# Patient Record
Sex: Female | Born: 1975 | Race: Black or African American | Hispanic: No | Marital: Single | Smoking: Never smoker
Health system: Southern US, Community
[De-identification: ages and names within clinical notes are randomized; demographics above are authoritative.]

## PROBLEM LIST (undated history)

## (undated) DIAGNOSIS — Z86718 Personal history of other venous thrombosis and embolism: Secondary | ICD-10-CM

## (undated) DIAGNOSIS — R739 Hyperglycemia, unspecified: Secondary | ICD-10-CM

## (undated) DIAGNOSIS — J309 Allergic rhinitis, unspecified: Secondary | ICD-10-CM

## (undated) DIAGNOSIS — N921 Excessive and frequent menstruation with irregular cycle: Secondary | ICD-10-CM

## (undated) DIAGNOSIS — R0683 Snoring: Secondary | ICD-10-CM

## (undated) DIAGNOSIS — E119 Type 2 diabetes mellitus without complications: Secondary | ICD-10-CM

## (undated) DIAGNOSIS — G4733 Obstructive sleep apnea (adult) (pediatric): Secondary | ICD-10-CM

## (undated) DIAGNOSIS — I10 Essential (primary) hypertension: Secondary | ICD-10-CM

## (undated) DIAGNOSIS — D649 Anemia, unspecified: Secondary | ICD-10-CM

## (undated) DIAGNOSIS — D563 Thalassemia minor: Secondary | ICD-10-CM

## (undated) DIAGNOSIS — R609 Edema, unspecified: Secondary | ICD-10-CM

## (undated) DIAGNOSIS — F32A Depression, unspecified: Secondary | ICD-10-CM

## (undated) DIAGNOSIS — M1990 Unspecified osteoarthritis, unspecified site: Secondary | ICD-10-CM

## (undated) DIAGNOSIS — F329 Major depressive disorder, single episode, unspecified: Secondary | ICD-10-CM

## (undated) DIAGNOSIS — K760 Fatty (change of) liver, not elsewhere classified: Secondary | ICD-10-CM

## (undated) DIAGNOSIS — F419 Anxiety disorder, unspecified: Secondary | ICD-10-CM

## (undated) DIAGNOSIS — D573 Sickle-cell trait: Secondary | ICD-10-CM

## (undated) DIAGNOSIS — M5416 Radiculopathy, lumbar region: Secondary | ICD-10-CM

## (undated) DIAGNOSIS — G43909 Migraine, unspecified, not intractable, without status migrainosus: Secondary | ICD-10-CM

## (undated) HISTORY — DX: Anemia, unspecified: D64.9

## (undated) HISTORY — DX: Edema, unspecified: R60.9

## (undated) HISTORY — DX: Sickle-cell trait: D57.3

## (undated) HISTORY — DX: Anxiety disorder, unspecified: F41.9

## (undated) HISTORY — DX: Depression, unspecified: F32.A

## (undated) HISTORY — DX: Major depressive disorder, single episode, unspecified: F32.9

## (undated) HISTORY — DX: Excessive and frequent menstruation with irregular cycle: N92.1

## (undated) HISTORY — DX: Allergic rhinitis, unspecified: J30.9

## (undated) HISTORY — DX: Type 2 diabetes mellitus without complications: E11.9

## (undated) HISTORY — DX: Essential (primary) hypertension: I10

## (undated) HISTORY — PX: CHOLECYSTECTOMY: SHX55

## (undated) HISTORY — DX: Radiculopathy, lumbar region: M54.16

## (undated) HISTORY — DX: Hyperglycemia, unspecified: R73.9

## (undated) HISTORY — PX: TUBAL LIGATION: SHX77

## (undated) HISTORY — DX: Snoring: R06.83

---

## 2005-01-24 DIAGNOSIS — Z86718 Personal history of other venous thrombosis and embolism: Secondary | ICD-10-CM

## 2005-01-25 DIAGNOSIS — R9389 Abnormal findings on diagnostic imaging of other specified body structures: Secondary | ICD-10-CM

## 2005-03-28 ENCOUNTER — Emergency Department (HOSPITAL_COMMUNITY): Admission: EM | Admit: 2005-03-28 | Discharge: 2005-03-29 | Payer: Self-pay | Admitting: Emergency Medicine

## 2005-03-29 ENCOUNTER — Emergency Department (HOSPITAL_COMMUNITY): Admission: EM | Admit: 2005-03-29 | Discharge: 2005-03-29 | Payer: Self-pay | Admitting: Emergency Medicine

## 2005-06-01 ENCOUNTER — Emergency Department (HOSPITAL_COMMUNITY): Admission: EM | Admit: 2005-06-01 | Discharge: 2005-06-01 | Payer: Self-pay | Admitting: Emergency Medicine

## 2005-06-08 ENCOUNTER — Emergency Department (HOSPITAL_COMMUNITY): Admission: EM | Admit: 2005-06-08 | Discharge: 2005-06-08 | Payer: Self-pay | Admitting: Emergency Medicine

## 2005-06-15 ENCOUNTER — Emergency Department (HOSPITAL_COMMUNITY): Admission: EM | Admit: 2005-06-15 | Discharge: 2005-06-15 | Payer: Self-pay | Admitting: Emergency Medicine

## 2005-07-19 ENCOUNTER — Emergency Department (HOSPITAL_COMMUNITY): Admission: EM | Admit: 2005-07-19 | Discharge: 2005-07-19 | Payer: Self-pay | Admitting: Emergency Medicine

## 2005-12-12 ENCOUNTER — Inpatient Hospital Stay (HOSPITAL_COMMUNITY): Admission: EM | Admit: 2005-12-12 | Discharge: 2005-12-17 | Payer: Self-pay | Admitting: Emergency Medicine

## 2005-12-21 ENCOUNTER — Ambulatory Visit: Payer: Self-pay | Admitting: Internal Medicine

## 2005-12-21 ENCOUNTER — Encounter (INDEPENDENT_AMBULATORY_CARE_PROVIDER_SITE_OTHER): Payer: Self-pay | Admitting: Internal Medicine

## 2005-12-21 LAB — CONVERTED CEMR LAB
HCT: 34 %
MCV: 62.6 fL
Platelets: 702 10*3/uL
RBC: 5.43 M/uL

## 2005-12-28 ENCOUNTER — Encounter (INDEPENDENT_AMBULATORY_CARE_PROVIDER_SITE_OTHER): Payer: Self-pay | Admitting: Internal Medicine

## 2005-12-28 ENCOUNTER — Ambulatory Visit: Payer: Self-pay | Admitting: Internal Medicine

## 2005-12-28 DIAGNOSIS — D509 Iron deficiency anemia, unspecified: Secondary | ICD-10-CM

## 2005-12-28 LAB — CONVERTED CEMR LAB: Iron: 20 ug/dL

## 2006-01-06 ENCOUNTER — Ambulatory Visit: Payer: Self-pay | Admitting: Internal Medicine

## 2006-02-01 ENCOUNTER — Ambulatory Visit: Payer: Self-pay | Admitting: Internal Medicine

## 2006-02-23 ENCOUNTER — Ambulatory Visit: Payer: Self-pay | Admitting: Internal Medicine

## 2006-03-25 ENCOUNTER — Ambulatory Visit: Payer: Self-pay | Admitting: Internal Medicine

## 2006-04-14 ENCOUNTER — Ambulatory Visit: Payer: Self-pay | Admitting: Internal Medicine

## 2006-05-05 ENCOUNTER — Emergency Department (HOSPITAL_COMMUNITY): Admission: EM | Admit: 2006-05-05 | Discharge: 2006-05-05 | Payer: Self-pay | Admitting: Emergency Medicine

## 2006-05-27 ENCOUNTER — Ambulatory Visit: Payer: Self-pay | Admitting: Internal Medicine

## 2006-07-25 ENCOUNTER — Encounter (INDEPENDENT_AMBULATORY_CARE_PROVIDER_SITE_OTHER): Payer: Self-pay | Admitting: Internal Medicine

## 2006-07-25 ENCOUNTER — Other Ambulatory Visit: Admission: RE | Admit: 2006-07-25 | Discharge: 2006-07-25 | Payer: Self-pay | Admitting: Internal Medicine

## 2006-07-25 ENCOUNTER — Ambulatory Visit: Payer: Self-pay | Admitting: Internal Medicine

## 2006-07-25 ENCOUNTER — Encounter: Payer: Self-pay | Admitting: Internal Medicine

## 2006-07-25 DIAGNOSIS — N9089 Other specified noninflammatory disorders of vulva and perineum: Secondary | ICD-10-CM | POA: Insufficient documentation

## 2006-07-25 LAB — CONVERTED CEMR LAB: Pap Smear: NORMAL

## 2006-10-19 ENCOUNTER — Emergency Department (HOSPITAL_COMMUNITY): Admission: EM | Admit: 2006-10-19 | Discharge: 2006-10-19 | Payer: Self-pay | Admitting: Emergency Medicine

## 2006-11-25 ENCOUNTER — Encounter: Admission: RE | Admit: 2006-11-25 | Discharge: 2006-11-25 | Payer: Self-pay | Admitting: Emergency Medicine

## 2006-11-25 ENCOUNTER — Emergency Department (HOSPITAL_COMMUNITY): Admission: EM | Admit: 2006-11-25 | Discharge: 2006-11-25 | Payer: Self-pay | Admitting: Emergency Medicine

## 2006-12-16 ENCOUNTER — Ambulatory Visit: Payer: Self-pay | Admitting: Internal Medicine

## 2006-12-19 ENCOUNTER — Ambulatory Visit: Payer: Self-pay | Admitting: Internal Medicine

## 2006-12-29 ENCOUNTER — Ambulatory Visit: Payer: Self-pay | Admitting: Internal Medicine

## 2007-01-12 ENCOUNTER — Ambulatory Visit: Payer: Self-pay | Admitting: Internal Medicine

## 2007-01-17 ENCOUNTER — Ambulatory Visit: Payer: Self-pay | Admitting: Internal Medicine

## 2007-01-26 ENCOUNTER — Ambulatory Visit (HOSPITAL_COMMUNITY): Admission: RE | Admit: 2007-01-26 | Discharge: 2007-01-26 | Payer: Self-pay | Admitting: Internal Medicine

## 2007-01-26 ENCOUNTER — Encounter (INDEPENDENT_AMBULATORY_CARE_PROVIDER_SITE_OTHER): Payer: Self-pay | Admitting: *Deleted

## 2007-05-11 ENCOUNTER — Ambulatory Visit: Payer: Self-pay | Admitting: Internal Medicine

## 2007-05-30 ENCOUNTER — Ambulatory Visit (HOSPITAL_COMMUNITY): Admission: RE | Admit: 2007-05-30 | Discharge: 2007-05-30 | Payer: Self-pay | Admitting: *Deleted

## 2007-06-02 ENCOUNTER — Ambulatory Visit: Payer: Self-pay | Admitting: Internal Medicine

## 2007-06-02 DIAGNOSIS — R7309 Other abnormal glucose: Secondary | ICD-10-CM

## 2007-06-03 ENCOUNTER — Encounter (INDEPENDENT_AMBULATORY_CARE_PROVIDER_SITE_OTHER): Payer: Self-pay | Admitting: Internal Medicine

## 2007-06-03 LAB — CONVERTED CEMR LAB
CO2: 23 meq/L (ref 19–32)
Creatinine, Ser: 0.89 mg/dL (ref 0.40–1.20)
Glucose, Bld: 148 mg/dL — ABNORMAL HIGH (ref 70–99)
Sodium: 139 meq/L (ref 135–145)

## 2007-06-09 ENCOUNTER — Encounter (INDEPENDENT_AMBULATORY_CARE_PROVIDER_SITE_OTHER): Payer: Self-pay | Admitting: Internal Medicine

## 2007-06-09 DIAGNOSIS — J45909 Unspecified asthma, uncomplicated: Secondary | ICD-10-CM | POA: Insufficient documentation

## 2007-06-09 DIAGNOSIS — I10 Essential (primary) hypertension: Secondary | ICD-10-CM | POA: Insufficient documentation

## 2007-06-09 DIAGNOSIS — L509 Urticaria, unspecified: Secondary | ICD-10-CM | POA: Insufficient documentation

## 2007-06-09 DIAGNOSIS — Z8659 Personal history of other mental and behavioral disorders: Secondary | ICD-10-CM | POA: Insufficient documentation

## 2007-06-09 DIAGNOSIS — J309 Allergic rhinitis, unspecified: Secondary | ICD-10-CM | POA: Insufficient documentation

## 2007-06-20 ENCOUNTER — Ambulatory Visit: Payer: Self-pay | Admitting: Internal Medicine

## 2007-07-11 ENCOUNTER — Ambulatory Visit (HOSPITAL_BASED_OUTPATIENT_CLINIC_OR_DEPARTMENT_OTHER): Admission: RE | Admit: 2007-07-11 | Discharge: 2007-07-11 | Payer: Self-pay | Admitting: *Deleted

## 2007-07-15 ENCOUNTER — Ambulatory Visit: Payer: Self-pay | Admitting: Internal Medicine

## 2007-07-25 ENCOUNTER — Ambulatory Visit: Payer: Self-pay | Admitting: Internal Medicine

## 2007-07-25 ENCOUNTER — Encounter (INDEPENDENT_AMBULATORY_CARE_PROVIDER_SITE_OTHER): Payer: Self-pay | Admitting: *Deleted

## 2007-12-15 ENCOUNTER — Ambulatory Visit: Payer: Self-pay | Admitting: Internal Medicine

## 2007-12-18 ENCOUNTER — Ambulatory Visit: Payer: Self-pay | Admitting: Internal Medicine

## 2008-03-11 ENCOUNTER — Telehealth (INDEPENDENT_AMBULATORY_CARE_PROVIDER_SITE_OTHER): Payer: Self-pay | Admitting: Internal Medicine

## 2008-03-29 ENCOUNTER — Encounter (INDEPENDENT_AMBULATORY_CARE_PROVIDER_SITE_OTHER): Payer: Self-pay | Admitting: *Deleted

## 2008-06-27 ENCOUNTER — Encounter (INDEPENDENT_AMBULATORY_CARE_PROVIDER_SITE_OTHER): Payer: Self-pay | Admitting: *Deleted

## 2008-08-27 ENCOUNTER — Emergency Department (HOSPITAL_COMMUNITY): Admission: EM | Admit: 2008-08-27 | Discharge: 2008-08-27 | Payer: Self-pay | Admitting: Emergency Medicine

## 2008-08-30 ENCOUNTER — Encounter (INDEPENDENT_AMBULATORY_CARE_PROVIDER_SITE_OTHER): Payer: Self-pay | Admitting: *Deleted

## 2008-09-10 ENCOUNTER — Telehealth (INDEPENDENT_AMBULATORY_CARE_PROVIDER_SITE_OTHER): Payer: Self-pay | Admitting: *Deleted

## 2008-09-10 ENCOUNTER — Telehealth (INDEPENDENT_AMBULATORY_CARE_PROVIDER_SITE_OTHER): Payer: Self-pay | Admitting: Internal Medicine

## 2008-09-19 ENCOUNTER — Telehealth (INDEPENDENT_AMBULATORY_CARE_PROVIDER_SITE_OTHER): Payer: Self-pay | Admitting: Internal Medicine

## 2008-09-27 ENCOUNTER — Emergency Department (HOSPITAL_COMMUNITY): Admission: EM | Admit: 2008-09-27 | Discharge: 2008-09-27 | Payer: Self-pay | Admitting: Emergency Medicine

## 2008-10-06 ENCOUNTER — Emergency Department (HOSPITAL_COMMUNITY): Admission: EM | Admit: 2008-10-06 | Discharge: 2008-10-06 | Payer: Self-pay | Admitting: Family Medicine

## 2008-11-05 ENCOUNTER — Ambulatory Visit: Payer: Self-pay | Admitting: Internal Medicine

## 2008-11-05 DIAGNOSIS — G4733 Obstructive sleep apnea (adult) (pediatric): Secondary | ICD-10-CM

## 2008-11-05 DIAGNOSIS — R82998 Other abnormal findings in urine: Secondary | ICD-10-CM | POA: Insufficient documentation

## 2008-11-05 DIAGNOSIS — R609 Edema, unspecified: Secondary | ICD-10-CM

## 2008-11-05 LAB — CONVERTED CEMR LAB
Protein, U semiquant: NEGATIVE
Specific Gravity, Urine: 1.02
pH: 5.5

## 2008-11-06 ENCOUNTER — Encounter (INDEPENDENT_AMBULATORY_CARE_PROVIDER_SITE_OTHER): Payer: Self-pay | Admitting: Internal Medicine

## 2008-11-07 ENCOUNTER — Ambulatory Visit: Payer: Self-pay | Admitting: Internal Medicine

## 2008-11-07 LAB — CONVERTED CEMR LAB
ALT: 8 units/L (ref 0–35)
AST: 10 units/L (ref 0–37)
BUN: 15 mg/dL (ref 6–23)
CO2: 21 meq/L (ref 19–32)
Creatinine, Ser: 0.98 mg/dL (ref 0.40–1.20)
Eosinophils Relative: 1 % (ref 0–5)
HCT: 37.8 % (ref 36.0–46.0)
Hemoglobin: 11.3 g/dL — ABNORMAL LOW (ref 12.0–15.0)
Lymphocytes Relative: 27 % (ref 12–46)
MCHC: 29.9 g/dL — ABNORMAL LOW (ref 30.0–36.0)
MCV: 68.1 fL — ABNORMAL LOW (ref 78.0–100.0)
RDW: 19.7 % — ABNORMAL HIGH (ref 11.5–15.5)
Total Bilirubin: 0.3 mg/dL (ref 0.3–1.2)
WBC: 10.2 10*3/uL (ref 4.0–10.5)

## 2008-11-08 ENCOUNTER — Encounter (INDEPENDENT_AMBULATORY_CARE_PROVIDER_SITE_OTHER): Payer: Self-pay | Admitting: Internal Medicine

## 2008-11-25 ENCOUNTER — Encounter (INDEPENDENT_AMBULATORY_CARE_PROVIDER_SITE_OTHER): Payer: Self-pay | Admitting: Internal Medicine

## 2008-11-27 ENCOUNTER — Encounter (INDEPENDENT_AMBULATORY_CARE_PROVIDER_SITE_OTHER): Payer: Self-pay | Admitting: Internal Medicine

## 2008-11-29 ENCOUNTER — Encounter (INDEPENDENT_AMBULATORY_CARE_PROVIDER_SITE_OTHER): Payer: Self-pay | Admitting: *Deleted

## 2008-12-10 LAB — CONVERTED CEMR LAB: UIBC: 303 ug/dL

## 2009-01-21 ENCOUNTER — Telehealth (INDEPENDENT_AMBULATORY_CARE_PROVIDER_SITE_OTHER): Payer: Self-pay | Admitting: *Deleted

## 2009-01-24 ENCOUNTER — Encounter (INDEPENDENT_AMBULATORY_CARE_PROVIDER_SITE_OTHER): Payer: Self-pay | Admitting: *Deleted

## 2009-03-04 ENCOUNTER — Emergency Department (HOSPITAL_COMMUNITY): Admission: EM | Admit: 2009-03-04 | Discharge: 2009-03-04 | Payer: Self-pay | Admitting: Family Medicine

## 2009-03-14 ENCOUNTER — Emergency Department (HOSPITAL_COMMUNITY): Admission: EM | Admit: 2009-03-14 | Discharge: 2009-03-14 | Payer: Self-pay | Admitting: Emergency Medicine

## 2009-04-09 ENCOUNTER — Ambulatory Visit: Payer: Self-pay | Admitting: Internal Medicine

## 2009-06-04 ENCOUNTER — Emergency Department (HOSPITAL_COMMUNITY): Admission: EM | Admit: 2009-06-04 | Discharge: 2009-06-04 | Payer: Self-pay | Admitting: Family Medicine

## 2009-06-09 ENCOUNTER — Telehealth (INDEPENDENT_AMBULATORY_CARE_PROVIDER_SITE_OTHER): Payer: Self-pay | Admitting: Nurse Practitioner

## 2009-06-13 ENCOUNTER — Telehealth (INDEPENDENT_AMBULATORY_CARE_PROVIDER_SITE_OTHER): Payer: Self-pay | Admitting: Internal Medicine

## 2009-06-15 ENCOUNTER — Emergency Department (HOSPITAL_COMMUNITY): Admission: EM | Admit: 2009-06-15 | Discharge: 2009-06-15 | Payer: Self-pay | Admitting: Emergency Medicine

## 2009-06-16 ENCOUNTER — Ambulatory Visit: Payer: Self-pay | Admitting: Physician Assistant

## 2009-06-17 ENCOUNTER — Encounter (INDEPENDENT_AMBULATORY_CARE_PROVIDER_SITE_OTHER): Payer: Self-pay | Admitting: Internal Medicine

## 2009-06-19 ENCOUNTER — Encounter: Payer: Self-pay | Admitting: Physician Assistant

## 2009-06-20 ENCOUNTER — Telehealth: Payer: Self-pay | Admitting: Physician Assistant

## 2009-06-20 ENCOUNTER — Telehealth (INDEPENDENT_AMBULATORY_CARE_PROVIDER_SITE_OTHER): Payer: Self-pay | Admitting: Internal Medicine

## 2009-07-17 ENCOUNTER — Ambulatory Visit: Payer: Self-pay | Admitting: Internal Medicine

## 2009-07-17 DIAGNOSIS — IMO0002 Reserved for concepts with insufficient information to code with codable children: Secondary | ICD-10-CM

## 2009-07-17 DIAGNOSIS — F410 Panic disorder [episodic paroxysmal anxiety] without agoraphobia: Secondary | ICD-10-CM

## 2009-07-18 ENCOUNTER — Encounter (INDEPENDENT_AMBULATORY_CARE_PROVIDER_SITE_OTHER): Payer: Self-pay | Admitting: Internal Medicine

## 2009-07-21 ENCOUNTER — Encounter (INDEPENDENT_AMBULATORY_CARE_PROVIDER_SITE_OTHER): Payer: Self-pay | Admitting: Internal Medicine

## 2009-07-29 ENCOUNTER — Telehealth (INDEPENDENT_AMBULATORY_CARE_PROVIDER_SITE_OTHER): Payer: Self-pay | Admitting: Internal Medicine

## 2009-08-07 ENCOUNTER — Telehealth (INDEPENDENT_AMBULATORY_CARE_PROVIDER_SITE_OTHER): Payer: Self-pay | Admitting: Internal Medicine

## 2009-08-08 ENCOUNTER — Encounter (INDEPENDENT_AMBULATORY_CARE_PROVIDER_SITE_OTHER): Payer: Self-pay | Admitting: Internal Medicine

## 2009-08-14 ENCOUNTER — Ambulatory Visit: Payer: Self-pay | Admitting: Internal Medicine

## 2009-08-22 ENCOUNTER — Telehealth (INDEPENDENT_AMBULATORY_CARE_PROVIDER_SITE_OTHER): Payer: Self-pay | Admitting: Internal Medicine

## 2009-08-29 ENCOUNTER — Encounter (INDEPENDENT_AMBULATORY_CARE_PROVIDER_SITE_OTHER): Payer: Self-pay | Admitting: Internal Medicine

## 2009-09-01 ENCOUNTER — Encounter (INDEPENDENT_AMBULATORY_CARE_PROVIDER_SITE_OTHER): Payer: Self-pay | Admitting: Internal Medicine

## 2009-09-24 ENCOUNTER — Encounter (INDEPENDENT_AMBULATORY_CARE_PROVIDER_SITE_OTHER): Payer: Self-pay | Admitting: Internal Medicine

## 2009-10-16 ENCOUNTER — Telehealth (INDEPENDENT_AMBULATORY_CARE_PROVIDER_SITE_OTHER): Payer: Self-pay | Admitting: Internal Medicine

## 2009-11-20 ENCOUNTER — Telehealth (INDEPENDENT_AMBULATORY_CARE_PROVIDER_SITE_OTHER): Payer: Self-pay | Admitting: Internal Medicine

## 2009-12-09 ENCOUNTER — Ambulatory Visit: Payer: Self-pay | Admitting: Internal Medicine

## 2009-12-09 DIAGNOSIS — M67919 Unspecified disorder of synovium and tendon, unspecified shoulder: Secondary | ICD-10-CM | POA: Insufficient documentation

## 2009-12-09 DIAGNOSIS — F329 Major depressive disorder, single episode, unspecified: Secondary | ICD-10-CM

## 2009-12-09 DIAGNOSIS — M719 Bursopathy, unspecified: Secondary | ICD-10-CM

## 2009-12-09 DIAGNOSIS — N920 Excessive and frequent menstruation with regular cycle: Secondary | ICD-10-CM

## 2009-12-17 LAB — CONVERTED CEMR LAB
Basophils Absolute: 0 10*3/uL (ref 0.0–0.1)
Basophils Relative: 0 % (ref 0–1)
Eosinophils Relative: 3 % (ref 0–5)
Hemoglobin: 9.2 g/dL — ABNORMAL LOW (ref 12.0–15.0)
Lymphs Abs: 1.8 10*3/uL (ref 0.7–4.0)
Monocytes Absolute: 0.5 10*3/uL (ref 0.1–1.0)
Monocytes Relative: 5 % (ref 3–12)
Platelets: 661 10*3/uL — ABNORMAL HIGH (ref 150–400)
RBC: 5.02 M/uL (ref 3.87–5.11)
RDW: 19 % — ABNORMAL HIGH (ref 11.5–15.5)
WBC: 9 10*3/uL (ref 4.0–10.5)

## 2009-12-26 ENCOUNTER — Telehealth (INDEPENDENT_AMBULATORY_CARE_PROVIDER_SITE_OTHER): Payer: Self-pay | Admitting: Internal Medicine

## 2009-12-29 ENCOUNTER — Telehealth (INDEPENDENT_AMBULATORY_CARE_PROVIDER_SITE_OTHER): Payer: Self-pay | Admitting: Internal Medicine

## 2010-01-02 ENCOUNTER — Encounter (INDEPENDENT_AMBULATORY_CARE_PROVIDER_SITE_OTHER): Payer: Self-pay | Admitting: Internal Medicine

## 2010-01-23 ENCOUNTER — Telehealth (INDEPENDENT_AMBULATORY_CARE_PROVIDER_SITE_OTHER): Payer: Self-pay | Admitting: Internal Medicine

## 2010-02-26 ENCOUNTER — Telehealth (INDEPENDENT_AMBULATORY_CARE_PROVIDER_SITE_OTHER): Payer: Self-pay | Admitting: Internal Medicine

## 2010-03-10 ENCOUNTER — Ambulatory Visit: Payer: Self-pay | Admitting: Internal Medicine

## 2010-03-10 ENCOUNTER — Telehealth (INDEPENDENT_AMBULATORY_CARE_PROVIDER_SITE_OTHER): Payer: Self-pay | Admitting: Internal Medicine

## 2010-03-10 DIAGNOSIS — R002 Palpitations: Secondary | ICD-10-CM

## 2010-03-16 ENCOUNTER — Telehealth (INDEPENDENT_AMBULATORY_CARE_PROVIDER_SITE_OTHER): Payer: Self-pay | Admitting: Internal Medicine

## 2010-04-01 ENCOUNTER — Telehealth (INDEPENDENT_AMBULATORY_CARE_PROVIDER_SITE_OTHER): Payer: Self-pay | Admitting: Internal Medicine

## 2010-04-01 ENCOUNTER — Encounter (INDEPENDENT_AMBULATORY_CARE_PROVIDER_SITE_OTHER): Payer: Self-pay | Admitting: Internal Medicine

## 2010-04-20 ENCOUNTER — Telehealth (INDEPENDENT_AMBULATORY_CARE_PROVIDER_SITE_OTHER): Payer: Self-pay | Admitting: Internal Medicine

## 2010-07-24 ENCOUNTER — Telehealth (INDEPENDENT_AMBULATORY_CARE_PROVIDER_SITE_OTHER): Payer: Self-pay | Admitting: Internal Medicine

## 2010-07-29 ENCOUNTER — Encounter (INDEPENDENT_AMBULATORY_CARE_PROVIDER_SITE_OTHER): Payer: Self-pay | Admitting: Internal Medicine

## 2010-08-18 ENCOUNTER — Telehealth (INDEPENDENT_AMBULATORY_CARE_PROVIDER_SITE_OTHER): Payer: Self-pay | Admitting: Internal Medicine

## 2010-08-19 ENCOUNTER — Telehealth (INDEPENDENT_AMBULATORY_CARE_PROVIDER_SITE_OTHER): Payer: Self-pay | Admitting: Internal Medicine

## 2010-09-16 ENCOUNTER — Telehealth (INDEPENDENT_AMBULATORY_CARE_PROVIDER_SITE_OTHER): Payer: Self-pay | Admitting: Internal Medicine

## 2010-09-27 ENCOUNTER — Emergency Department (HOSPITAL_COMMUNITY): Admission: EM | Admit: 2010-09-27 | Discharge: 2010-09-27 | Payer: Self-pay | Admitting: Family Medicine

## 2010-09-28 ENCOUNTER — Telehealth (INDEPENDENT_AMBULATORY_CARE_PROVIDER_SITE_OTHER): Payer: Self-pay | Admitting: Internal Medicine

## 2010-09-30 ENCOUNTER — Telehealth (INDEPENDENT_AMBULATORY_CARE_PROVIDER_SITE_OTHER): Payer: Self-pay | Admitting: Internal Medicine

## 2010-10-15 ENCOUNTER — Telehealth (INDEPENDENT_AMBULATORY_CARE_PROVIDER_SITE_OTHER): Payer: Self-pay | Admitting: Internal Medicine

## 2010-11-09 ENCOUNTER — Telehealth (INDEPENDENT_AMBULATORY_CARE_PROVIDER_SITE_OTHER): Payer: Self-pay | Admitting: Internal Medicine

## 2010-11-12 ENCOUNTER — Ambulatory Visit: Admit: 2010-11-12 | Payer: Self-pay | Admitting: Internal Medicine

## 2010-11-23 ENCOUNTER — Encounter: Payer: Self-pay | Admitting: Internal Medicine

## 2010-12-01 NOTE — Progress Notes (Signed)
Summary: med refills  Phone Note Call from Patient Call back at Home Phone [phone]   Summary of Call: Ms. [name] needs more refills from tramadol, arthrotec and hydrocodone medications.  [facility] Initial call taken by: Manon Hilding,  December 26, 2009 11:00 AM  Follow-up for Phone Call        Pt last got a months worth of ultram and vicodin on 11-24-09 and Arthrotec was last given 09-19-09 #60/2 Follow-up by: Vesta Mixer CMA,  December 26, 2009 12:35 PM    Prescriptions: ULTRAM 50 MG TABS (TRAMADOL HCL) 2  tabs by mouth three times a day  #180 x 0   Entered and Authorized by:   Julieanne Manson MD   Signed by:   Julieanne Manson MD on 12/26/2009   Method used:   Printed then faxed to ...       [facility]  [address] [phone]* (retail)       [address]       [place], [place]  [address]       Ph: [phone]       Fax: [phone]   RxID:   [id number] VICODIN 5-500 MG TABS (HYDROCODONE-ACETAMINOPHEN) 1 tab by mouth every 4 hours as needed severe pain  #30 x 0   Entered and Authorized by:   Julieanne Manson MD   Signed by:   Julieanne Manson MD on 12/26/2009   Method used:   Printed then faxed to ...       [facility]  [address] [phone]* (retail)       [address]       [place], [place]  [address]       Ph: [phone]       Fax: [phone]   RxID:   [id number] ARTHROTEC 75 75-200 MG-MCG TAB (DICLOFENAC-MISOPROSTOL) Take one (1) tablet by mouth twice a day (with food).  #60 x 2   Entered and Authorized by:   Julieanne Manson MD   Signed by:   Julieanne Manson MD on 12/26/2009   Method used:   Printed then faxed to ...       [facility]  [address] [phone]* (retail)       [address]       [place], [place]  [address]       Ph: [phone]       Fax: [phone]   RxID:   [id number]

## 2010-12-01 NOTE — Progress Notes (Signed)
Summary: Neurosurgery referral  Phone Note Outgoing Call   Summary of Call: Debra:  see Neurosurgery referral and letter back in 07/17/09 or thereabouts--need to rerefer--pt. now has money to go.  She is also able to start PT as well for this if that info is requested by NS. Initial call taken by: Julieanne Manson MD,  Mar 10, 2010 12:53 PM

## 2010-12-01 NOTE — Progress Notes (Signed)
Summary: Refills Request  Phone Note Call from Patient Call back at Home Phone [phone]   Summary of Call: Pt needs more refills from tramadol, vicodin and flexeril medications.  The medicaid stopped paying for cozaar and pt is wondered which medication she can start taking to replace cozaar.  ([facility] [address]) Please call her back.  Aniyah Nobis MD Initial call taken by: Manon Hilding,  January 23, 2010 11:43 AM  Follow-up for Phone Call        Ultram and vicodin last filled 12/26/09.  Flexeril 06/11/09.  Cozaar should be appoved now (was done 01/02/10) Follow-up by: Vesta Mixer CMA,  January 23, 2010 1:34 PM  Additional Follow-up for Phone Call Additional follow up Details #1::        Please call pharmacy and see what the deal is with the Cozaar--I think we've been through this at least 3 times regarding her Cozaar. Flexeril has not been filled for some time--will fill others. Additional Follow-up by: Julieanne Manson MD,  January 26, 2010 3:28 PM    Additional Follow-up for Phone Call Additional follow up Details #2::    Pt states she had to travel home to check on her dad and was in the car for several hours which made her back real stiff and sore and that is why she is requesting the flexeril this time.  No problem with the Cozaar, just that her one year auth from last year had run out and they needed a new one, which we have obtained. Follow-up by: Vesta Mixer CMA,  January 26, 2010 4:48 PM  Prescriptions: FLEXERIL 10 MG TAB (CYCLOBENZAPRINE HCL) One tablet by mouth daily as needed for muscles  #30 x 0   Entered and Authorized by:   Julieanne Manson MD   Signed by:   Julieanne Manson MD on 02/02/2010   Method used:   Printed then faxed to ...       [facility]  [address] [phone]* (retail)       [address]       [place], [place]  [address]       Ph: [phone]       Fax: [phone]   RxID:   [id number] VICODIN 5-500 MG TABS (HYDROCODONE-ACETAMINOPHEN) 1 tab by  mouth every 4 hours as needed severe pain  #30 x 0   Entered and Authorized by:   Julieanne Manson MD   Signed by:   Julieanne Manson MD on 01/26/2010   Method used:   Printed then faxed to ...       [facility]  [address] [phone]* (retail)       [address]       [place], [place]  [address]       Ph: [phone]       Fax: [phone]   RxID:   [id number] ULTRAM 50 MG TABS (TRAMADOL HCL) 2  tabs by mouth three times a day  #180 x 0   Entered and Authorized by:   Julieanne Manson MD   Signed by:   Julieanne Manson MD on 01/26/2010   Method used:   Printed then faxed to ...       [facility]  [address] [phone]* (retail)       [address]       [place], [place]  [address]       Ph: [phone]       Fax: [phone]   RxID:   [id number] COZAAR 50 MG  TABS (  LOSARTAN POTASSIUM) 1 tab by mouth daily  #30 x 11   Entered and Authorized by:   Julieanne Manson MD   Signed by:   Julieanne Manson MD on 01/26/2010   Method used:   Electronically to        [facility] [phone]* (retail)       [address]       [place], [place]  [address]       Ph: [phone]       Fax: [phone]   RxID:   [id number]

## 2010-12-01 NOTE — Letter (Signed)
Summary: ORDER FOR NUTRITION & DIABETES  ORDER FOR NUTRITION & DIABETES   Imported By: Arta Bruce 12/09/2009 12:38:35  _____________________________________________________________________  External Attachment:    Type:   Image     Comment:   External Document

## 2010-12-01 NOTE — Letter (Signed)
Summary: [facility] NEUROSURGERY/CONSULTATION REQUEST  [facility]UROS[facility]/CONSULTATION REQUEST   Imported By: Arta Bruce 12/17/2009 14:30:07  _____________________________________________________________________  External Attachment:    Type:   Image     Comment:   External Document

## 2010-12-01 NOTE — Letter (Signed)
Summary: Generic Letter  [facility]  [address] [place], [place] [address]   Phone: [phone]  Fax: [phone]    07/29/2010  [facility] [address] [place], [place]  [address]  Dear Ms. [name],  We have been unable to contact you by telephone regarding your recent call to [place].  Please call our office, at your earliest convenience, so that we may speak with you.  Sincerely,   Dutch Quint RN

## 2010-12-01 NOTE — Progress Notes (Signed)
Summary: DO WE HAVE ANY ADVAIS AND VENTOLIN SAMPLES  Phone Note Call from Patient Call back at Home Phone [phone]   Summary of Call: [name] [name] CALLING TO SEE IF WE HAVE SAMPLES OF THE ADVAIR DISC AND VENTOLIN INHALER, [name] IS STARTING TO BOTHER HER AND SHE IS HAVING SOME WHEEZING.  MS Pau SAYS THAT IF YOU CALL HER BACK, SHE'S IN SCHOOL NOW, BUT IF YOU CAN LEAVE HER A DETAIL MESSAGE ON HER CELL PHONE. Initial call taken by: Leodis Rains,  August 18, 2010 11:58 AM  Follow-up for Phone Call        Left message on answering machine for pt. to return call.  Dutch Quint RN  August 19, 2010 11:03 AM  Lost all Medicaid benefits -- advised to try health dept for assistance with her meds and to make appt. with eligibility for orange card. Follow-up by: Dutch Quint RN,  August 19, 2010 11:59 AM

## 2010-12-01 NOTE — Letter (Signed)
Summary: REFERRAL/PERSONAL CARE SERVICES  REFERRAL/PERSONAL CARE SERVICES   Imported By: Arta Bruce 11/19/2009 11:39:01  _____________________________________________________________________  External Attachment:    Type:   Image     Comment:   External Document

## 2010-12-01 NOTE — Progress Notes (Signed)
Summary: Refills request  Phone Note Call from Patient Call back at [facility] Phone [phone]   Summary of Call: The pt needs more refills from tramadol and vicodin medications. ([facility] [address])  Pt states that her shoulder is hurting her very bad. [facility] Initial call taken by: Manon Hilding,  February 26, 2010 3:16 PM  Follow-up for Phone Call        Both last filled on 01/26/10. Follow-up by: Vesta Mixer CMA,  February 26, 2010 4:08 PM    Prescriptions: VICODIN 5-500 MG TABS (HYDROCODONE-ACETAMINOPHEN) 1 tab by mouth every 4 hours as needed severe pain  #30 x 0   Entered and Authorized by:   Julieanne Manson MD   Signed by:   Julieanne Manson MD on 03/01/2010   Method used:   Printed then faxed to ...       [facility]  [address] [phone]* (retail)       [address]       [place], [place]  [address]       Ph: [phone]       Fax: [phone]   RxID:   [id number] ULTRAM 50 MG TABS (TRAMADOL HCL) 2  tabs by mouth three times a day  #180 x 0   Entered and Authorized by:   Julieanne Manson MD   Signed by:   Julieanne Manson MD on 03/01/2010   Method used:   Printed then faxed to ...       [facility]  [address] [phone]* (retail)       [address]       [place], [place]  [address]       Ph: [phone]       Fax: [phone]   RxID:   [id number]

## 2010-12-01 NOTE — Progress Notes (Signed)
Summary: Hydrocodone and Tramadol  Phone Note Refill Request   Refills Requested: Medication #1:  ULTRAM 50 MG TABS 2  tabs by mouth three times a day  Medication #2:  VICODIN 5-500 MG TABS 1 tab by mouth every 4 hours as needed severe pain Initial call taken by: Dutch Quint RN,  September 30, 2010 4:38 PM  Follow-up for Phone Call        Rx printed - unsure of pt's pharmacy  verify pt with and fax rx to the pharmacy Follow-up by: Lehman Prom FNP,  September 30, 2010 5:35 PM  Additional Follow-up for Phone Call Additional follow up Details #1::        Left message on answering machine for pt. to return call.  Dutch Quint RN  October 01, 2010 10:18 AM  Rxs faxed to [facility] [address] per pt. request.  Dutch Quint RN  October 02, 2010 9:36 AM     Prescriptions: VICODIN 5-500 MG TABS (HYDROCODONE-ACETAMINOPHEN) 1 tab by mouth every 4 hours as needed severe pain  #30 x 0   Entered and Authorized by:   Lehman Prom FNP   Signed by:   Lehman Prom FNP on 09/30/2010   Method used:   Printed then faxed to ...       [facility]  [address] [phone]* (retail)       [address]       [place], [place]  [address]       Ph: [phone]       Fax: [phone]   RxID:   [id number] ULTRAM 50 MG TABS (TRAMADOL HCL) 2  tabs by mouth three times a day  #180 x 0   Entered and Authorized by:   Lehman Prom FNP   Signed by:   Lehman Prom FNP on 09/30/2010   Method used:   Printed then faxed to ...       [facility]  [address] [phone]* (retail)       [address]       [place], [place]  [address]       Ph: [phone]       Fax: [phone]   RxID:   [id number]

## 2010-12-01 NOTE — Progress Notes (Signed)
Summary: Neurosurgery or Pain mangemt  Phone Note From Other Clinic   Caller: Referral Coordinator Summary of Call: Pt was referred to [facility] Neurosurgery.The office informed me that they do not do injections, they only perform surgeries.Office also states that Pt had appt with their pain management provider and cancellled.So where would you like to go with this referral.Pain mangemt or surgery?.....P.S Is there any documentation to support surgery? Initial call taken by: Candi Leash,  Mar 16, 2010 9:59 AM  Follow-up for Phone Call        PLease see my original referral letter dated 07/17/09.  The main reason I would like her to see Neurosurgery is secondary to inability to fully evaluate her spine as she is too heavy for all MR[place] in state.  I do not know if she is a surgical candidate or not.  She did not go to previous planned appt. as she could not afford, but now brother is helping her out.  We are looking to see if something more can be done rather than just pain management.  I was hoping NS would consider evaluating with a myelogram if they feel her signs and symptoms warrant--again in previous referral note.  This problem has significantly altered her life to point she cannot work and therefore has had problems getting money to cover.  If they won't accept this, let me know. Follow-up by: Julieanne Manson MD,  Mar 18, 2010 8:06 AM  Additional Follow-up for Phone Call Additional follow up Details #1::        [facility] said that there is no established study supporting an appt,so until there is they will not make an appt.How would like to move forward with this? Additional Follow-up by: Candi Leash,  Mar 25, 2010 8:51 AM    Additional Follow-up for Phone Call Additional follow up Details #2::    Spoke with Candi Leash while away from EMR earlier in week and explained the situation.  She was too see if could get in with [facility] Debra--just sending you back as  a reminder.  Thanks for your time with this Follow-up by: Julieanne Manson MD,  Mar 28, 2010 5:32 PM  Additional Follow-up for Phone Call Additional follow up Details #3:: Details for Additional Follow-up Action Taken: Christena Flake is reviewing notes.As soon as I find out I will send you an update Susie called from [facility] to say that Ms.Willow is not an candidate for surgery.Maybe she could go to [facility] for injections.What do you think?    We can try that--referral to interventional radiology.  Thanks for the idea. Order written.  Julieanne Manson MD  April 20, 2010 8:48 AM   Additional Follow-up by: Candi Leash,  April 15, 2010 2:32 PM

## 2010-12-01 NOTE — Progress Notes (Signed)
Summary: Query Advair  Phone Note From Pharmacy   Caller: [facility]  [address] [phone]* Summary of Call: Request refill Advair Last seen 03/10/10, Last filled 09/2009 for 11 refills. Do you want to fill? Initial call taken by: Gaylyn Cheers RN,  September 28, 2010 3:54 PM  Follow-up for Phone Call        Yes--for 6 months--needs a follow up for more Follow-up by: Julieanne Manson MD,  September 29, 2010 3:48 PM  Additional Follow-up for Phone Call Additional follow up Details #1::        Refill sent. Gaylyn Cheers RN  September 29, 2010 5:30 PM  Pt. aware of refills -- F/U appt. 11/12/10.  Dutch Quint RN  September 29, 2010 5:34 PM

## 2010-12-01 NOTE — Progress Notes (Signed)
Summary: Neuro update  Phone Note From Other Clinic   Caller: Referral Coordinator Summary of Call: Pt exceeds wt limit for [facility].Judi from [employer] [facility].So I'll try them. Initial call taken by: Candi Leash,  April 20, 2010 4:28 PM  Follow-up for Phone Call        Thanks Follow-up by: Julieanne Manson MD,  April 20, 2010 5:56 PM  Additional Follow-up for Phone Call Additional follow up Details #1::        [facility]  said that they don't do injections but maybe able to perform an MRI if she is less than 17 inches height from laying flat on back to top of belly Would you happen to know if this is th case with her? Additional Follow-up by: Candi Leash,  April 21, 2010 2:45 PM    Additional Follow-up for Phone Call Additional follow up Details #2::    I do not know--they told us previously that they would not do an MRI as she weighed too much.  I can have Tiffany bring her in and measure her lying down.  Very doubtful that she will meet this criteria.  Thanks Stanton Kidney.    Tiffany--could you have Hser come in and have her lie down on a bed and measure her for above?  Julieanne Manson MD  April 22, 2010 2:17 PM   Additional Follow-up for Phone Call Additional follow up Details #3:: Details for Additional Follow-up Action Taken: Pt aware and will come in tomorrow to be measured........... Vesta Mixer CMA  April 29, 2010 2:34 PM

## 2010-12-01 NOTE — Assessment & Plan Note (Signed)
Summary: 2 month f/u /tmm   Vital Signs:  Patient profile:   35 year old female Menstrual status:  regular Weight:      476 pounds Temp:     97.8 degrees F Pulse rhythm:   regular Resp:     18 per minute BP sitting:   126 / 83  (left arm) Cuff size:   large  Vitals Entered By: Vesta Mixer CMA (December 09, 2009 10:38 AM) CC: 2 month f/u, back is still hurting out of bp meds x 1 weeks due to no money to get. Is Patient Diabetic? No Pain Assessment Patient in pain? yes     Location: back Intensity: 6  Does patient need assistance? Ambulation Normal   CC:  2 month f/u and back is still hurting out of bp meds x 1 weeks due to no money to get.Marland Kitchen  History of Present Illness: 1.  Social issues:  pt. not able to afford to pay copay for visits to specialists--can only afford $3 copay once per month--has to alternate what med she gets.  Did not go to Dr. Murray Hodgkins for low back and radicular pain.  Is getting help with Medicaid Home Aide.  Helping clean the home.  Is going to a counselor, Milagros Evener  [phone]--has appt. with her today.  Does get food stamps.  Working on depression and weight.  Does not feel Zoloft is working.  Took for total of 2 months--stopped in December or November.  Appetite has decreased since starting counseling 1 month ago.  Would be willing to try a higher dose of Zoloft.  Father of 3 children disappeared in 2004--has not had any contact.  Pt. feels he developed mental illness.   His parents are quite chronically ill and unable to help out.    2.  Radicular low back pain:  Still has referral info--brother may be able to help her out.  Has been unable to pay the $3 copay to see Dr. Penelope Galas as above.  Currently using Tramadol, Vicodin, Neurontin.  Have been unable to get an MRI secondary to pt's weight--pt. working on weight loss so can get this done.  3.  Left arm/shoulder pain for about 3-4 months:  if abducts or maybe flex, hurts to put clothes on.  If holding  something and raises her shoulder, the pain becomes so intense that she is unable to hold onto the object.  No history of injury.  Does use that side to pull herself up since the problem with her back.    4.  Abnormal periods:  Heavy, mildly prolonged periods past 2 months.  This month seems more like usual.  Lot of clotting and having to use many more feminine products.  Lot more cramping.  Feeling light headed and tired.  Not clear when her last pap was done--was lost to follow up for some time.  Allergies (verified): 1)  Amoxicillin  Physical Exam  General:  Morbdily obese, NAD Msk:  Left shoulder and arm:  Unable to flex or abduct above 90 degrees secondary to pain.  Tender over AC, CC and subdeltoid bursa area.  Pain and difficulty resisting downward force on internally rotated, abducted outstretched arm on left compared to right. NT over c spinous processes and paraspinous musculature.  NT over clavicle and scapula   Informed consent obtained for shoulder injection: Left subdeltoid area cleaned in sterile fashion.  1% lidocaine injected subcutaneously and to bursa area.  40 mg of Depomedrol and 1 cc  of 1% lidocaine injected to bursa area.  Pt. tolerated well without complication.  Bandaid applied. Neurologic:  Good grip bilaterally.  Motor strength normal and equal in bilateral upper extremities.   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 Discussed that ultimately, this would be a good idea, but needs to get relief of pain so she can exercise--encouraged her to get $3 to go to Dr. Murray Hodgkins.  She will speak with her brother. Her updated medication list for this problem includes:    Arthrotec 75 75-200 Mg-mcg Tab (Diclofenac-misoprostol) .Marland Kitchen... Take one (1) tablet by  mouth twice a day (with food).    Flexeril 10 Mg Tab (Cyclobenzaprine hcl) ..... One tablet by mouth daily as needed for muscles    Ultram 50 Mg Tabs (Tramadol hcl) .Marland Kitchen... 2  tabs by mouth three times a day    Vicodin 5-500 Mg Tabs (Hydrocodone-acetaminophen) .Marland Kitchen... 1 tab by mouth every 4 hours as needed severe pain  Problem # 3:  MORBID OBESITY (ICD-278.01) Encouraged her lifestyle changes and applauded weight loss. Pt. okay with me speaking to her counselor.  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 1/2 tab by mouth for 7 days, then 1 tab by mouth daily for 7 days, then 2 tabs by mouth daily  Complete Medication List: 1)  Singulair 10 Mg Tabs (Montelukast sodium) .Marland Kitchen.. 1 tab by mouth daily 2)  Cozaar 50 Mg Tabs (Losartan potassium) .Marland Kitchen.. 1 tab by mouth daily 3)  Proventil Hfa 108 (90 Base) Mcg/act Aers (Albuterol sulfate) .... 2 puffs every 4 hours as needed for wheezing 4)  Cetirizine Hcl 10 Mg Tabs (Cetirizine hcl) .Marland Kitchen.. 1 tab by mouth daily as needed allergies 5)  Graduated Compression Stockings--knee High. Moderate Compre  .... Put first thing in morning and remove at bedtime. 6)  Ferrous Sulfate 325 (65 Fe) Mg Tabs (Ferrous sulfate) .Marland Kitchen.. 1 tab by mouth two times a day 7)  Fluticasone Propionate 50 Mcg/act Susp (Fluticasone propionate) .... 2 sprays each nostril daily 8)  Arthrotec 75 75-200 Mg-mcg Tab (Diclofenac-misoprostol) .... Take one (1) tablet by mouth twice a day (with food). 9)  Flexeril 10 Mg Tab (Cyclobenzaprine hcl) .... One tablet by mouth daily as needed for muscles 10)  Ultram 50 Mg Tabs (Tramadol hcl) .... 2  tabs by mouth three times a day 11)  Neurontin 300 Mg Caps (Gabapentin) .... One cap by mouth three times a day 12)  Zoloft 50 Mg Tabs  (Sertraline hcl) .... 1/2 tab by mouth for 7 days, then 1 tab by mouth daily for 7 days, then 2 tabs by mouth daily 13)  Round Shower Stool Misc (Misc. devices) .... 724.4 14)  Advair Diskus 250-50 Mcg/dose Aepb (Fluticasone-salmeterol) .Marland Kitchen.. 1 inhalation two times a day 15)  Vicodin 5-500 Mg Tabs (Hydrocodone-acetaminophen) .Marland Kitchen.. 1 tab by mouth every 4 hours as needed severe pain  Patient Instructions: 1)  Release of info for Pat  [facility]--to be able to discuss generally how you are doing. 2)  Follow up with Dr. Delrae Alfred in 3 months --back and shoulder pain  Prescriptions: ZOLOFT 50 MG TABS (SERTRALINE HCL) 1/2 tab by mouth for 7 days, then 1 tab by mouth daily for 7 days, then 2 tabs by mouth daily  #60 x 2   Entered and Authorized by:   Julieanne Manson MD   Signed by:   Julieanne Manson MD on 12/09/2009   Method used:   Electronically to        [facility] [phone]* (retail)       [address]       [place], [place]  [address]       Ph: [phone]       Fax: [phone]   RxID:   [id number]

## 2010-12-01 NOTE — Progress Notes (Signed)
°  Phone Note Call from Patient Call back at Home Phone [phone]   Reason for Call: Refill Medication Summary of Call: Pt called in again because neither of three medications are helping her with her lower back and leg pain and she cannot walk to go to the bathroom or any other way around her house.  Pt is wondering can call in a stronger pain medication.  ([facility] [address]) Delrae Alfred MD  Initial call taken by: Manon Hilding,  June 13, 2009 8:50 AM  Follow-up for Phone Call        spoke with pt and she says her is not helping her back and she is unable to move.... she said she recieved the med on Aug. 11.... pt says she is in the same pain as before... she says she is unable to sleep because of the pain.... pt has appt on Monday....................Marland KitchenArmenia Shannon  June 13, 2009 4:56 PM   Additional Follow-up for Phone Call Additional follow up Details #1::        Needs to be seen first. Additional Follow-up by: Julieanne Manson MD,  June 15, 2009 4:48 PM    Additional Follow-up for Phone Call Additional follow up Details #2::    pt was here today.............Marland KitchenArmenia Shannon  June 16, 2009 5:46 PM

## 2010-12-01 NOTE — Progress Notes (Signed)
Summary: Query:  Refill Ventolin?  Phone Note Outgoing Call   Summary of Call: Do you want to refill Ventolin?  Last seen 03/2010. Initial call taken by: Dutch Quint RN,  August 19, 2010 12:44 PM  Follow-up for Phone Call        Fine to fill.  1 + 1 refill.  Does she have Medicaid again?  If not, see if we have samples she can pick up.  Advair as well.  .   Follow-up by: Julieanne Manson MD,  August 20, 2010 11:27 PM  Additional Follow-up for Phone Call Additional follow up Details #1::        Refill completed -- pt. notified, states that she has Medicaid again Additional Follow-up by: Dutch Quint RN,  August 21, 2010 4:31 PM

## 2010-12-01 NOTE — Progress Notes (Signed)
Summary: refills  Phone Note Call from Patient Call back at [facility] Phone [phone]   Summary of Call: The pt re-scheduled the appointment but she is wondering if the provider can refills her vicodin and tramadol medication ([facility] [address]) Delrae Alfred MD Initial call taken by: Manon Hilding,  November 20, 2009 10:10 AM  Follow-up for Phone Call        Last got #30 Vicodin on 10/21/09 and #180 on the same date. Follow-up by: Vesta Mixer CMA,  November 21, 2009 3:38 PM    Prescriptions: VICODIN 5-500 MG TABS (HYDROCODONE-ACETAMINOPHEN) 1 tab by mouth every 4 hours as needed severe pain  #30 x 0   Entered and Authorized by:   Lehman Prom FNP   Signed by:   Lehman Prom FNP on 11/24/2009   Method used:   Printed then faxed to ...       [facility]  [address] [phone]* (retail)       [address]       [place], [place]  [address]       Ph: [phone]       Fax: [phone]   RxID:   [id number] ULTRAM 50 MG TABS (TRAMADOL HCL) 2  tabs by mouth three times a day  #180 x 0   Entered and Authorized by:   Lehman Prom FNP   Signed by:   Lehman Prom FNP on 11/24/2009   Method used:   Printed then faxed to ...       [facility]  [address] [phone]* (retail)       [address]       [place], [place]  [address]       Ph: [phone]       Fax: [phone]   RxID:   [id number]

## 2010-12-01 NOTE — Letter (Signed)
Summary: REFERRAL//PHYSICAL THERAPY  REFERRAL//PHYSICAL THERAPY   Imported By: Arta Bruce 04/01/2010 09:52:10  _____________________________________________________________________  External Attachment:    Type:   Image     Comment:   External Document

## 2010-12-01 NOTE — Progress Notes (Signed)
Summary: REQUESTING REFILL ON PAIN MEDS  Phone Note Call from Patient Call back at Home Phone [phone]   Reason for Call: Refill Medication Summary of Call: [name] PT. MS Mathew IS CALLING IN FOR FOR HER TRAMADOL AND VICODEN Initial call taken by: Leodis Rains,  April 01, 2010 9:38 AM  Follow-up for Phone Call        Pain meds were both filled on 03/01/10. Follow-up by: Vesta Mixer CMA,  April 02, 2010 9:49 AM  Additional Follow-up for Phone Call Additional follow up Details #1::        pt is aware Additional Follow-up by: Armenia Shannon,  April 02, 2010 2:55 PM    Prescriptions: VICODIN 5-500 MG TABS (HYDROCODONE-ACETAMINOPHEN) 1 tab by mouth every 4 hours as needed severe pain  #30 x 0   Entered and Authorized by:   Julieanne Manson MD   Signed by:   Julieanne Manson MD on 04/02/2010   Method used:   Telephoned to ...       [facility]  [address] [phone]* (retail)       [address]       [place], [place]  [address]       Ph: [phone]       Fax: [phone]   RxID:   [id number] ULTRAM 50 MG TABS (TRAMADOL HCL) 2  tabs by mouth three times a day  #180 x 0   Entered and Authorized by:   Julieanne Manson MD   Signed by:   Julieanne Manson MD on 04/02/2010   Method used:   Telephoned to ...       [facility]  [address] [phone]* (retail)       [address]       [place], [place]  [address]       Ph: [phone]       Fax: [phone]   RxID:   [id number]  Please call these into the pharmacy --already documented.

## 2010-12-01 NOTE — Progress Notes (Signed)
  Phone Note From Pharmacy   Caller: [facility]  [address] [phone]* Summary of Call: Calling for preauthorization of Cozaar--I thought we had her preauthorizations already taken care of--please call Medicaid and see if this is needed or if Cozaar already good for the year.  If she does need preauth done, I'm pretty sure she had an ACE I cough with Lisinpril that did not transfer from her paper chart--will need to pull that and check in latter OVs and fill out preauth for 1 year to cover Cozaar.  If there is a different ARB that is covered without preauth, will switch to that as well. Initial call taken by: Julieanne Manson MD,  December 29, 2009 8:41 AM  Follow-up for Phone Call        Other PA was from Jan 2010 to Jan 2011.  Filled out a new one and faxed it. Follow-up by: Vesta Mixer CMA,  January 01, 2010 4:09 PM  Additional Follow-up for Phone Call Additional follow up Details #1::        Thanks Additional Follow-up by: Julieanne Manson MD,  January 03, 2010 12:52 PM    New/Updated Medications: COZAAR 50 MG  TABS (LOSARTAN POTASSIUM) 1 tab by mouth daily

## 2010-12-01 NOTE — Medication Information (Signed)
Summary: RX Folder//COZAAR//APPROVED  RX Folder//COZAAR//APPROVED   Imported By: Arta Bruce 01/02/2010 11:04:41  _____________________________________________________________________  External Attachment:    Type:   Image     Comment:   External Document

## 2010-12-01 NOTE — Assessment & Plan Note (Signed)
Summary: follow up with Dr Delrae Alfred in 3 months back and shoulder pain...   Vital Signs:  Patient profile:   35 year old female Menstrual status:  regular Height:      68.25 inches Weight:      478 pounds BMI:     72.41 Temp:     97.8 degrees F Pulse rate:   109 / minute Pulse rhythm:   regular Resp:     20 per minute BP sitting:   138 / 82  (left arm) Cuff size:   large  Vitals Entered By: Vesta Mixer CMA (Mar 10, 2010 11:20 AM) CC: f/u back pain and her left shoulder is still hurting her also.  Not taking the zoloft. Is Patient Diabetic? No Pain Assessment Patient in pain? yes     Location: back/legs Intensity: 5  Does patient need assistance? Ambulation Normal   CC:  f/u back pain and her left shoulder is still hurting her also.  Not taking the zoloft..  History of Present Illness: 1.  Left Rotator Cuff Syndrome:  Had worsening pain after injection--better now, but still with significant pain and limited motion--able to go to PT now--brother helping out.  Is working with ROM exercises--but really just doing them when pain really at it's worst.  2.  Left radicular back pain:  Has the funds to go to Neurosurgery now--would like to be evaluated and would like to go to PT for this as well.  Still trying to work on diet and exercise--weight up today despite this.  3.  Depression:  Even on increased dose of Zoloft, did not feel the medication helped, so stopped.  Pain is now better controlled--so now getting out and doing things again--more involved.  When runs out of meds, can feel all of previous depression coming back on.  4.  Palpitations:  Noted fluttering in chest intermittently  for about 4 days when went to be with father in [place] as he was undergoing an AKA for a leg infection.  She states she did not feel well when this was going on.  Resolved when she returned to her home in [place] yesterday.  No associated chest pain or dyspnea.  Felt weak.  Allergies  (verified): 1)  Amoxicillin  Family History: Father, 84:  DM with AKA age 35.  Asbestosis, smoker. Mother, 6:  hypertension, alcoholism.   Maternal:  2 brothers:  healthy Paternal:  5 brothers:  1 with schizophrenia, rest healthy.  1 Sister:  healthy 3 Children:  6 yo:  ADHD   55 yo twins:  ADHD  Social History: Lives with 3 children--pt's brother in and out of home. 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   No significant other  Physical Exam  General:  morbidly obese Lungs:  Normal respiratory effort, chest expands symmetrically. Lungs are clear to auscultation, no crackles or wheezes. Heart:  Normal rate and regular rhythm. S1 and S2 normal without gallop, murmur, click, rub or other extra sounds.  Radial pulses normal and equal. Extremities:  Very limited motion of left shoulder   Impression & Recommendations:  Problem # 1:  DEPRESSION (ICD-311) Much improved even off meds--as long as pain is controlled The following medications were removed from the medication list:    Zoloft 50 Mg Tabs (Sertraline hcl) .Marland Kitchen... 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 Her updated medication list for this problem includes:    Arthrotec 75 75-200 Mg-mcg Tab (Diclofenac-misoprostol) .Marland Kitchen... Take one (1) tablet by mouth twice a day (with food).    Flexeril 10 Mg Tab (Cyclobenzaprine hcl) ..... One tablet by mouth daily as needed for muscles    Ultram 50 Mg Tabs (Tramadol hcl) .Marland Kitchen... 2  tabs by mouth three times a day    Vicodin 5-500 Mg Tabs (Hydrocodone-acetaminophen) .Marland Kitchen... 1 tab by mouth every 4 hours as needed severe pain  Orders: Physical Therapy Referral (PT)  Problem # 4:  PALPITATIONS  (ICD-785.1)  Orders: EKG w/ Interpretation ([address])  T-Comprehensive Metabolic Panel ([address])  Complete Medication List: 1)  Cozaar 50 Mg Tabs (Losartan potassium) .Marland Kitchen.. 1 tab by mouth daily 2)  Proventil Hfa 108 (90 Base) Mcg/act Aers (Albuterol sulfate) .... 2 puffs every 4 hours as needed for wheezing 3)  Cetirizine Hcl 10 Mg Tabs (Cetirizine hcl) .Marland Kitchen.. 1 tab by mouth daily as needed allergies 4)  Graduated Compression Stockings--knee High. Moderate Compre  .... Put first thing in morning and remove at bedtime. 5)  Fluticasone Propionate 50 Mcg/act Susp (Fluticasone propionate) .... 2 sprays each nostril daily 6)  Arthrotec 75 75-200 Mg-mcg Tab (Diclofenac-misoprostol) .... Take one (1) tablet by mouth twice a day (with food). 7)  Flexeril 10 Mg Tab (Cyclobenzaprine hcl) .... One tablet by mouth daily as needed for muscles 8)  Ultram 50 Mg Tabs (Tramadol hcl) .... 2  tabs by mouth three times a day 9)  Neurontin 300 Mg Caps (Gabapentin) .... One cap by mouth three times a day 10)  Round Shower Stool Misc (Misc. devices) .... 724.4 11)  Advair Diskus 250-50 Mcg/dose Aepb (Fluticasone-salmeterol) .Marland Kitchen.. 1 inhalation two times a day 12)  Vicodin 5-500 Mg Tabs (Hydrocodone-acetaminophen) .Marland Kitchen.. 1 tab by mouth every 4 hours as needed severe pain 13)  Ferrous Sulfate 325 (65 Fe) Mg Tabs (Ferrous sulfate) .Marland Kitchen.. 1 tab by mouth two times a day Prescriptions: FLEXERIL 10 MG TAB (CYCLOBENZAPRINE HCL) One tablet by mouth daily as needed for muscles  #30 x 0   Entered and Authorized by:   Julieanne Manson MD   Signed by:   Julieanne Manson MD on 03/10/2010   Method used:   Print then Give to Patient   RxID:   [id number] NEURONTIN 300 MG CAPS (GABAPENTIN) One cap by mouth three times a day  #90 x 11   Entered and Authorized by:   Julieanne Manson MD   Signed by:   Julieanne Manson MD on 03/10/2010   Method used:   Electronically to        [facility] [address] [phone]* (retail)        [address]       [place], [place]  [address]       Ph: [phone]       Fax: [phone]   RxID:   [id number] ARTHROTEC 75 75-200 MG-MCG TAB (DICLOFENAC-MISOPROSTOL) Take one (1) tablet by mouth twice a day (with food).  #60 x 11   Entered and Authorized by:   Julieanne Manson MD   Signed by:   Julieanne Manson MD on 03/10/2010   Method used:   Electronically to        [facility] [address] [phone]* (retail)       [address]  [place], [place]  [address]       Ph: [phone]       Fax: [phone]   RxID:   [id number] FLUTICASONE PROPIONATE 50 MCG/ACT SUSP (FLUTICASONE PROPIONATE) 2 sprays each nostril daily  #1 x 11   Entered and Authorized by:   Julieanne Manson MD   Signed by:   Julieanne Manson MD on 03/10/2010   Method used:   Electronically to        [facility] [address] [phone]* (retail)       [address]       [place], [place]  [address]       Ph: [phone]       Fax: [phone]   RxID:   [id number] CETIRIZINE HCL 10 MG TABS (CETIRIZINE HCL) 1 tab by mouth daily as needed allergies  #30 x 11   Entered and Authorized by:   Julieanne Manson MD   Signed by:   Julieanne Manson MD on 03/10/2010   Method used:   Electronically to        [facility] [phone]* (retail)       [address]       [place], [place]  [address]       Ph: [phone]       Fax: [phone]   RxID:   [id number]

## 2010-12-01 NOTE — Progress Notes (Signed)
Summary: NO LONGER HAS MEDICAID/NEED MEDS  Phone Note Call from Patient Call back at Home Phone [phone]   Summary of Call: MULBERRY PT. MS Rodriquez CALLED AND CANCELLED HER APPT TODAY BECAUSE HER MEDICAID WAS CUT OFF AND SHE DOESN'T KNOW HOW SHE WILL BE ABLE TO AFFORD TO GET HER MEDS. AND SHE IS IN BED AND IN A LOT OF PAIN.  I DID TELL HER THAT SHE WOULD HAVE TOAPPLY FOR THE ORANGE CARD. Initial call taken by: Leodis Rains,  July 24, 2010 9:00 AM  Follow-up for Phone Call        Left message on answering machine for pt. to return call.  Dutch Quint RN  July 24, 2010 11:36 AM  Left message on answering machine for pt. to return call.  Dutch Quint RN  July 27, 2010 10:52 AM  Left message on answering machine for pt. to return call.  Dutch Quint RN  July 28, 2010 10:33 AM  Letter sent. Dutch Quint RN  July 29, 2010 10:35 AM         Appended Document: NO LONGER HAS MEDICAID/NEED MEDS Talked with her medicaid worker and cut off food stamp and medicaid benefits.  Her back hurts really bad and she's having trouble with her menses, coming on and off.  Is out of several meds, including arthrotec, losartan, advair diskus and ventolin inhaler, flexeril.  Is it possible to get them refilled at the [facility]?  Dutch Quint RN  August 04, 2010 4:22 PM

## 2010-12-03 NOTE — Progress Notes (Signed)
Summary: Phone Call  Phone Note Call from Patient   Summary of Call: MS Grosvenor CALLED YOU BACK/YOU WERE WITH PT PHONE # [phone] SHE IS IN COURT TODAY SO IF YOU LEAVE A MESSAGE SHE WILL GIVE YOU A CALL BACK Initial call taken by: Arta Bruce,  October 15, 2010 11:25 AM  Follow-up for Phone Call        No phone note explanation for call. Did anyone else call her?  Dutch Quint RN  October 15, 2010 12:21 PM   Additional Follow-up for Phone Call Additional follow up Details #1::        The last phone call was by Aggie Cosier to verify where pain meds should be faxed--see last note. Suspect that may be why she is calling. Please call pt. and find out if that is what she wants or something else Additional Follow-up by: Julieanne Manson MD,  October 21, 2010 1:21 PM    Additional Follow-up for Phone Call Additional follow up Details #2::    Left message on answering machine for pt. to return call.  Dutch Quint RN  October 21, 2010 2:34 PM  Wanted to know if Dr. Delrae Alfred had filled something else for Arthrotec.  States she got the new Rx - no further problems.  Dutch Quint RN  October 22, 2010 1:06 PM

## 2010-12-03 NOTE — Progress Notes (Signed)
Summary: meds refill  Phone Note Call from Patient Call back at [phone]   Summary of Call: need refill on tramadol and hydrocodone , [facility] [address]  Initial call taken by: Domenic Polite,  November 09, 2010 4:00 PM    Prescriptions: ULTRAM 50 MG TABS (TRAMADOL HCL) 2  tabs by mouth three times a day  #180 x 0   Entered and Authorized by:   Julieanne Manson MD   Signed by:   Julieanne Manson MD on 11/09/2010   Method used:   Printed then faxed to ...       [facility]  [address] [phone]* (retail)       [address]       [place], [place]  [address]       Ph: [phone]       Fax: [phone]   RxID:   [id number] VICODIN 5-500 MG TABS (HYDROCODONE-ACETAMINOPHEN) 1 tab by mouth every 4 hours as needed severe pain  #30 x 0   Entered and Authorized by:   Julieanne Manson MD   Signed by:   Julieanne Manson MD on 11/09/2010   Method used:   Printed then faxed to ...       [facility]  [address] [phone]* (retail)       [address]       [place], [place]  [address]       Ph: [phone]       Fax: [phone]   RxID:   [id number]

## 2010-12-03 NOTE — Progress Notes (Signed)
Summary: PA needed for Arthrotec  Phone Note Outgoing Call   Summary of Call: Called to see if pt. still without Medicaid coverage.   If she does not, needs to get an eligibility appt. with HS--can then get meds through our pharmacy or [facility] pharmacy. Left message to call us back and let us know   Initial call taken by: Julieanne Manson MD,  September 16, 2010 8:03 AM  Follow-up for Phone Call        PT CALLED AND SHE WANT THE [facility]INE [facility] [address] [facility] AND SHE NEED PRE AUTORIZATION  FOR HER MEDICNE ARCHOCEC 75 MGRS PLEASE CALL HER AT [phone].Domenic Polite  September 23, 2010 2:22 PM  Follow-up by: Domenic Polite,  September 23, 2010 2:22 PM  Additional Follow-up for Phone Call Additional follow up Details #1::        Spoke with Medicaid -- states no PA needed.  Advised pharmacy of Medicaid response and gave them phone number to call if still rejected.  [phone].       Appended Document: PA needed for Arthrotec Received request for PA again from pharmacy -- PA still needed.  Spoke with Medicaid, PA still needed.  Arthrotec denied -- does not meet criteria.  Pt. needs to have used 2 preferred NSAIDS plus 1 preferred PPI such as Nexium.  If she is Rx'd diclofenac and misoprostol separately, she does not need a PA.  Dutch Quint RN  October 01, 2010 10:21 AM  Received another PA request for Arthrotec.  Dutch Quint RN  October 15, 2010 3:47 PM

## 2010-12-14 ENCOUNTER — Telehealth (INDEPENDENT_AMBULATORY_CARE_PROVIDER_SITE_OTHER): Payer: Self-pay | Admitting: Internal Medicine

## 2010-12-23 NOTE — Progress Notes (Signed)
Summary: REFILLS ON MONTHLY MEDS  Phone Note Call from Patient Call back at Home Phone [phone]   Reason for Call: Refill Medication Summary of Call: [name] PT. [name] NEEDS REFILLS ON HER HYDROCODONE AND TRAMADOL AT [facility] [address] Initial call taken by: Leodis Rains,  December 14, 2010 9:39 AM    Prescriptions: VICODIN 5-500 MG TABS (HYDROCODONE-ACETAMINOPHEN) 1 tab by mouth every 4 hours as needed severe pain  #30 x 0   Entered and Authorized by:   Julieanne Manson MD   Signed by:   Julieanne Manson MD on 12/15/2010   Method used:   Printed then faxed to ...       [facility]  [address] [phone]* (retail)       [address]       [place], [place]  [address]       Ph: [phone]       Fax: [phone]   RxID:   [id number] ULTRAM 50 MG TABS (TRAMADOL HCL) 2  tabs by mouth three times a day  #180 x 0   Entered and Authorized by:   Julieanne Manson MD   Signed by:   Julieanne Manson MD on 12/15/2010   Method used:   Printed then faxed to ...       [facility]  [address] [phone]* (retail)       [address]       [place], [place]  [address]       Ph: [phone]       Fax: [phone]   RxID:   [id number]

## 2011-03-16 NOTE — Procedures (Signed)
[name], BURD                  ACCOUNT NO.:  [id number]   MEDICAL RECORD NO.:  [id number]          PATIENT TYPE:  OUT   LOCATION:  SLEEP CENTER                 FACILITY[facility]  [facility]   PHYSICIAN:  Clinton D. Maple Hudson, MD, FCCP, FACPDATE OF BIRTH:  Oct 23, 1975   DATE OF STUDY:  07/11/2007                            NOCTURNAL POLYSOMNOGRAM   REFERRING PHYSICIAN:  Rosine Abe, M.D.   INDICATION FOR STUDY:  Hypersomnia with sleep apnea.   EPWORTH SLEEPINESS SCORE:  14/24, height 6 feet, weight 450 pounds.   MEDICATIONS:  Home medications listed and reviewed.   SLEEP ARCHITECTURE:  Total sleep time 307 minutes with sleep efficiency  81%.  Stage I was 9%, stage II 63%, stage III 4%, REM 6% of total sleep  time.  Sleep latency 12 minutes, REM latency 268 minutes, awake after  sleep onset 58 minutes, arousal index 16.8.  An albuterol inhaler was  used at 1240 hours.   RESPIRATORY DATA:  Split study protocol.  Apnea-hypopnea index (AHI,  RDI) 25.5 obstructive events per hour, indicating moderate obstructive  sleep apnea/hypopnea syndrome.  Events before CPAP were limited to a  total of 62 hypopneas.  Overall events were most common while supine but  significant in all sleep positions.  REM AHI 5.6.  CPAP was titrated to  16 CWP, AHI 0 per hour.  A large ComfortGel full face mask was used with  heated humidifier.   OXYGEN DATA:  Moderate to loud snoring with oxygen desaturation nadir of  85% on room air before CPAP.  After CPAP titration oxygen saturation  held 96% on room air.   CARDIAC DATA:  Normal sinus rhythm with occasional PVC.   MOVEMENT-PARASOMNIA:  Occasional limb jerks, insignificant.  The patient  indicates that she will get up at home as many as 6 to 8 times per  night, blaming her diabetes.  Bathroom x1 during the study.   IMPRESSIONS-RECOMMENDATIONS:  1. Moderate obstructive sleep apnea/hypopnea syndrome, apnea-hypopnea      index 25.5 per hour, with events more common  while supine but      significant in all sleep positions.  Moderate to loud snoring with      oxygen desaturation nadir 85%.  2. Successful continuous positive airway pressure titration to 16      centimeters of water pressure, apnea-hypopnea index 0 per hour.  A      large ComfortGel full face mask was chosen, with heated humidifier.      Clinton D. Maple Hudson, MD, FCCP, FACP  Diplomate, Biomedical engineer of Sleep Medicine  Electronically Signed     CDY/MEDQ  D:  07/15/2007 14:58:00  T:  07/16/2007 12:29:59  Job:  [id number]

## 2011-03-19 NOTE — Discharge Summary (Signed)
[name], SPADER                  ACCOUNT NO.:  [id number]   MEDICAL RECORD NO.:  [id number]          PATIENT TYPE:  INP   LOCATION:  1428                         FACILITY[facility]  [facility]   PHYSICIAN:  Sherin Quarry, MD      DATE OF BIRTH:  01-Nov-1976   DATE OF ADMISSION:  12/11/2005  DATE OF DISCHARGE:  12/17/2005                                 DISCHARGE SUMMARY   ADDENDUM:  At the time of discharge the patient was instructed, by Dr.  [name], to take Coumadin 15 mg daily at bedtime. She was further instructed  to follow up with [facility] at their [facility] office.  She was told  that it was a essential that if she takes Coumadin, on a regular basis, she  must have her prothrombin times measured regularly. She understands that  this is extremely important.   DISCHARGE DIAGNOSES:  As previously indicated.   FOLLOWUP:  She will follow up with [facility].           ______________________________  Sherin Quarry, MD     SY/MEDQ  D:  12/17/2005  T:  12/17/2005  Job:  [id number]   cc:   Dineen Kid. Reche Dixon, M.D.  Fax: [phone]

## 2011-03-19 NOTE — H&P (Signed)
[name], [name]                  ACCOUNT NO.:  [id number]   MEDICAL RECORD NO.:  [id number]          PATIENT TYPE:  INP   LOCATION:  1428                         FACILITY:  [facility]   PHYSICIAN:  Kela Millin, M.D.DATE OF BIRTH:  11-Mar-1976   DATE OF ADMISSION:  12/11/2005  DATE OF DISCHARGE:                                HISTORY & PHYSICAL   CHIEF COMPLAINT:  Shortness of breath.   HISTORY OF PRESENT ILLNESS:  The patient is a morbidly obese 35 year old  black female with a past medical history significant for pulmonary embolus  diagnosed in March of 2006 and patient only stayed on Coumadin for 2 months  and has been noncompliant with Coumadin since then, and asthma. She presents  with complaints of shortness of breath for the past 4 days. She states that  this is different from her asthma symptoms because this shortness of breath  is not relieved by her bronchodilators. She also admits to a cough that is  productive of clear phlegm x4 days. She denies fevers. She admits to PND x  about 1 week, states she uses one pillow and that has not changed, reports  leg swelling for the past few days as well. Ms. Egner denies chest pain,  hemoptysis, nausea and vomiting, hematemesis, hematochezia, or melena, no  dysuria or diarrhea.   The patient was seen in the ER and a D-dimer was noted to be elevated at  1.73. Point of care markers negative x1. A chest x-ray showed no active  disease. It was noted that the patient's weight was 480, and because of this  we were not able to do a CT angiogram or VQ scan because the weight limit is  450 for the VQ scan and less for the CT angiogram. The patient is admitted  to the [facility] service for further evaluation and management.   PAST MEDICAL HISTORY:  1.  As stated above.  2.  History of hives/urticaria.   MEDICATIONS:  1.  Albuterol nebulizers.  2.  Benadryl p.r.n. hives.   ALLERGIES:  AMOXICILLIN.   SOCIAL HISTORY:  She denies  tobacco, also denies alcohol. She admits to  marijuana use about once a week.   FAMILY HISTORY:  Her father has hypertension and her aunts have diabetes  mellitus.   REVIEW OF SYSTEMS:  As per HPI, other review of systems negative.   PHYSICAL EXAMINATION:  GENERAL:  The patient is a pleasant, morbidly obese  black female in no respiratory distress.  VITAL SIGNS:  Temperature 98.5, blood pressure 140/71, pulse of 84,  respiratory rate of 20, O2 saturation 100%.  HEENT:  Pupils are equal, round and reactive to light. EOMI. Sclerae  anicteric. Moist mucous membranes. No oral exudates.  NECK:  Supple, no thyromegaly and no JVD appreciated.  LUNGS:  Decreased breath sounds in the bases, otherwise clear to  auscultation bilaterally, no wheezes.  CARDIOVASCULAR:  Regular rate and rhythm, normal S1, S2, no S3 appreciated.  ABDOMEN:  Obese, bowel sounds present, nontender, no masses palpable.  EXTREMITIES:  Trace edema, no cyanosis.  NEURO:  Alert and oriented x3. Cranial nerves II-XII grossly intact.  Nonfocal exam.   LABORATORY DATA:  Chest x-ray - no active cardiopulmonary disease. Point of  care markers negative x1. Sodium is 136, potassium 3.8, chloride of 108, CO2  is 25, glucose 100. BUN of 14, creatinine 1.1. Liver function tests are  within normal limits. D-dimer is 1.73. White cell count is 10,100 with a  hemoglobin of 9.8, hematocrit 32, platelet count of 648,000, neutrophil  count 70%.  Her pH is 7.43 with a PCO2 of 35.9, PO2 91.7, bicarb 23.8, O2  saturation is 97.8 on room air.   ASSESSMENT AND PLAN:  PROBLEM #1. Probable pulmonary embolus - as discussed  above, patient with history of pulmonary embolus, noncompliant with Coumadin  after 2 months of anticoagulation, presenting with shortness of breath,  elevated D-dimer and above the weight limit for VQ scanner and CT angiogram.  Will start empiric antibiotics. Follow, and find out in the a.m. if local  scanners or CT's  available with a higher weight limit for definitive  diagnosis, then start Coumadin as appropriate. Will also check a BNP as  patient reports paroxysmal nocturnal dyspnea and consider a 2D echo/further  evaluation as appropriate  pending results.   PROBLEM #2. History of asthma - bronchodilators p.r.n.      Kela Millin, M.D.  Electronically Signed     ACV/MEDQ  D:  12/11/2005  T:  12/12/2005  Job:  [id number]

## 2011-03-19 NOTE — Discharge Summary (Signed)
[name], [name]                  ACCOUNT NO.:  [id number]   MEDICAL RECORD NO.:  [id number]          PATIENT TYPE:  INP   LOCATION:  1428                         FACILITY:  [facility]   PHYSICIAN:  Michelle L. [name], MD  DATE OF BIRTH:  [birth year]   DATE OF ADMISSION:  12/11/2005  DATE OF DISCHARGE:                                 DISCHARGE SUMMARY   CHIEF COMPLAINT:  Shortness of breath.   DISCHARGING DIAGNOSES:  1.  Shortness of breath secondary to exacerbation of asthma with possibility      of underlying recurrent pulmonary embolus.  The patient was known to      have a diagnosis of pulmonary embolus in March of 2006.  She stayed on      Coumadin for 2 months and then was noncompliant; she has been off since.      At this time there is no way to deny or confirm that she has a pulmonary      embolus and because of her weight, we are unable to put her on the CAT      scan or do a V/Q, therefore we have elected to treat her empirically for      a pulmonary embolus.  Since starting heparinization, the patient's      symptoms have significantly improved.  At present, she remains awaiting      therapeutic INR.  It has been felt to be unsafe to discharge her      prematurely, as she is on large doses of Coumadin and I would prefer      that she be on a stable dose prior to discharge.  2.  Asthma.  The patient appears to have compensated asthma at this time.      Please note that the shortness of breath that she was complaining of was      not relieved with bronchodilators as usually would be; this therefore      supports further concept for possible recurrent pulmonary embolus.  3.  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  Of note, the patient did have 1      episode during the course of the hospital stay; it was responsive to      Pepcid and Benadryl.  4.  Morbid obesity.  The patient is very interested in becoming involved in      a weight loss group; I will request that Care Management  attempt to      locate a program for her.   DISCHARGE MEDICATIONS:  Medications at the time of discharge will be  determined in the addendum to this discharge summary, as at this time we are  continuing to titrate her Coumadin.   HISTORY OF PRESENT ILLNESS:  The patient is a 35 year old African American  female who is morbidly obese, weighing greater than 450 pounds.  She has a  past history of pulmonary embolus diagnosed in 2006.  She has been  noncompliant with Coumadin since that time.  Her only other medical  condition is asthma, which according to the patient usually responds to her  bronchodilators.  The shortness of breath that she experienced over the past  couple of weeks has not been responsive to bronchodilators.  She has  complained of PND for the last week and occasional leg swelling.  She denied  any chest pain, hemoptysis, nausea, vomiting, hematochezia, but came to the  emergency room because she was unable to relieve the symptoms of shortness  of breath.  In the ED the patient was noted to have a D-dimer elevated at  1.73.  Cardiac markers were obtained and were negative.  Her chest x-ray  showed no active disease.  Because of the patient's weight of in excess of  480 pounds, it was felt that the patient was unable to have V/Q scan or a CT  angiogram to confirm the fact that this likely was a recurrent pulmonary  embolus.   HOSPITAL COURSE:  The patient therefore was admitted to the telemetry floor  and monitored while she was started on Lovenox and Coumadin.  Of note, the  decision was made not to pursue Dopplers of the lower extremities as the  studies would be technically difficult and the outcome would be no  different.  The patient was started on Coumadin and required large doses in  order to achieve even small changes in her INR; we therefore have elected to  keep her in the hospital to get her as close to goal as possible before  discharging her, as the large  doses of Coumadin put her at risk for adverse  effects.   On the day prior to discharge, the patient's vital signs have remained  stable.  She is normocephalic, atraumatic.  Pupils are equal, round and  reactive to light.  Extraocular muscles are intact.  Mucous membranes are  moist.  Neck is supple.  There is no JVD, no lymph nodes and no carotid  bruits.  Her chest is distant, but clear.  Cardiovascular is regular rate  and rhythm, positive S1 and S2, no S3 or S4, no murmurs, rubs, or gallops.  Abdomen is morbidly obese.  Extremities show no clubbing, cyanosis or edema.  She has 2+ pulses.   The patient has been seen by Care Management and her medication needs were  addressed.  She evidently will be able to obtain medications inexpensively  and the patient is open and willing to continue to take her Coumadin.  The  patient will need followup with the [facility] community, once it has been  deemed safe for her to be discharged.  Please see the addendum to this  discharge summary for further medication list.      Michelle L. [name], MD  Electronically Signed     MLT/MEDQ  D:  12/16/2005  T:  12/17/2005  Job:  [id number]

## 2012-01-10 ENCOUNTER — Encounter (HOSPITAL_COMMUNITY): Payer: Self-pay | Admitting: Emergency Medicine

## 2012-01-10 ENCOUNTER — Emergency Department (HOSPITAL_COMMUNITY)
Admission: EM | Admit: 2012-01-10 | Discharge: 2012-01-11 | Disposition: A | Discharge status: Home Health | Payer: Medicaid Other | Attending: Emergency Medicine | Admitting: Emergency Medicine

## 2012-01-10 DIAGNOSIS — J45909 Unspecified asthma, uncomplicated: Secondary | ICD-10-CM | POA: Insufficient documentation

## 2012-01-10 DIAGNOSIS — T50905A Adverse effect of unspecified drugs, medicaments and biological substances, initial encounter: Secondary | ICD-10-CM

## 2012-01-10 DIAGNOSIS — T398X5A Adverse effect of other nonopioid analgesics and antipyretics, not elsewhere classified, initial encounter: Secondary | ICD-10-CM | POA: Insufficient documentation

## 2012-01-10 DIAGNOSIS — R110 Nausea: Secondary | ICD-10-CM | POA: Insufficient documentation

## 2012-01-10 DIAGNOSIS — T394X5A Adverse effect of antirheumatics, not elsewhere classified, initial encounter: Secondary | ICD-10-CM | POA: Insufficient documentation

## 2012-01-10 DIAGNOSIS — G4733 Obstructive sleep apnea (adult) (pediatric): Secondary | ICD-10-CM | POA: Insufficient documentation

## 2012-01-10 DIAGNOSIS — R42 Dizziness and giddiness: Secondary | ICD-10-CM | POA: Insufficient documentation

## 2012-01-10 HISTORY — DX: Morbid (severe) obesity due to excess calories: E66.01

## 2012-01-10 HISTORY — DX: Obstructive sleep apnea (adult) (pediatric): G47.33

## 2012-01-10 HISTORY — DX: Personal history of other venous thrombosis and embolism: Z86.718

## 2012-01-10 NOTE — ED Notes (Signed)
Patient with dizziness that started 30 min prior to EMS arrival.  Patient took 2 tramadols and 2 ibuprofen prior to the dizziness.  CAOx3, no other complaints at this time.

## 2012-01-11 ENCOUNTER — Encounter (HOSPITAL_COMMUNITY): Payer: Self-pay | Admitting: Emergency Medicine

## 2012-01-11 ENCOUNTER — Other Ambulatory Visit: Payer: Self-pay

## 2012-01-11 LAB — POCT I-STAT, CHEM 8
Potassium: 3.5 mEq/L (ref 3.5–5.1)
Sodium: 140 mEq/L (ref 135–145)

## 2012-01-11 MED ORDER — SODIUM CHLORIDE 0.9 % IV SOLN: Freq: Once | INTRAVENOUS | Status: DC

## 2012-01-11 MED ORDER — SODIUM CHLORIDE 0.9 % IV BOLUS (SEPSIS)
1000.0000 mL | Freq: Once | INTRAVENOUS | Status: AC
  Administered 2012-01-11: 1000 mL via INTRAVENOUS

## 2012-01-11 NOTE — ED Provider Notes (Signed)
History     CSN: [id number]  Arrival date & time 01/10/12  2350   First MD Initiated Contact with Patient 01/11/12 0024      Chief Complaint  Patient presents with  . Dizziness    (Consider location/radiation/quality/duration/timing/severity/associated sxs/prior treatment) The history is provided by the patient.   Patient presents with dizziness that began after taking 2 Ultram and of ibuprofen prior to going to bed. Patient began to feel dizziness immediately after this. Some nausea without vomiting. Denies any chest pain abdominal pain. Did become lightheaded when standing and had a low velocity fall. Denies any neck pain or neck injury from that. Is feeling better at this time Past Medical History  Diagnosis Date  . Asthma   . OSA (obstructive sleep apnea)   . History of blood clots   . Morbid obesity     History reviewed. No pertinent past surgical history.  No family history on file.  History  Substance Use Topics  . Smoking status: Never Smoker   . Smokeless tobacco: Not on file  . Alcohol Use: No    OB History    Grav Para Term Preterm Abortions TAB SAB Ect Mult Living                  Review of Systems  All other systems reviewed and are negative.    Allergies  Amoxicillin  Home Medications   Current Outpatient Rx  Name Route Sig Dispense Refill  . ALBUTEROL SULFATE HFA 108 (90 BASE) MCG/ACT IN AERS Inhalation Inhale 2 puffs into the lungs 2 (two) times daily as needed. For shortness of breath    . DOCUSATE SODIUM 100 MG PO CAPS Oral Take 100 mg by mouth every other day.    Marland Kitchen FLUTICASONE-SALMETEROL 250-50 MCG/DOSE IN AEPB Inhalation Inhale 1 puff into the lungs every 12 (twelve) hours.    Marland Kitchen HYDROCODONE-ACETAMINOPHEN 5-500 MG PO TABS Oral Take 1-2 tablets by mouth every 6 (six) hours as needed. For pain    . IBUPROFEN 200 MG PO TABS Oral Take 800 mg by mouth every 6 (six) hours as needed. For pain    . LOSARTAN POTASSIUM 50 MG PO TABS  Oral Take 50 mg by mouth daily.    . TRAMADOL HCL 50 MG PO TABS Oral Take 50 mg by mouth every 8 (eight) hours as needed. For pain      BP 123/59  Pulse 95  Temp(Src) 97.9 F (36.6 C) (Oral)  Resp 13  SpO2 100%  LMP 12/31/2011  Physical Exam  Nursing note and vitals reviewed. Constitutional: She is oriented to person, place, and time. Vital signs are normal. She appears well-developed and well-nourished.  Non-toxic appearance. No distress.  HENT:  Head: Normocephalic and atraumatic.  Eyes: Conjunctivae, EOM and lids are normal. Pupils are equal, round, and reactive to light.  Neck: Normal range of motion. Neck supple. No tracheal deviation present. No mass present.  Cardiovascular: Normal rate, regular rhythm and normal heart sounds.  Exam reveals no gallop.   No murmur heard. Pulmonary/Chest: Effort normal and breath sounds normal. No stridor. No respiratory distress. She has no decreased breath sounds. She has no wheezes. She has no rhonchi. She has no rales.  Abdominal: Soft. Normal appearance and bowel sounds are normal. She exhibits no distension. There is no tenderness. There is no rebound and no CVA tenderness.  Musculoskeletal: Normal range of motion. She exhibits no edema and no tenderness.  Neurological: She is alert and  oriented to person, place, and time. She has normal strength. No cranial nerve deficit or sensory deficit. GCS eye subscore is 4. GCS verbal subscore is 5. GCS motor subscore is 6.  Skin: Skin is warm and dry. No abrasion and no rash noted.  Psychiatric: She has a normal mood and affect. Her speech is normal and behavior is normal.    ED Course  Procedures (including critical care time)  Labs Reviewed - No data to display No results found.   No diagnosis found.    MDM  Patient given IV fluids and feels better. Patient is not orthostatic, suspect medication reaction as etiology of her symptoms. She'll be discharged home        Toy Baker, MD 01/11/12 [phone]

## 2012-01-11 NOTE — ED Notes (Signed)
Patient given discharge paperwork; went over discharge instructions with patient.  Instructed patient to take medications as directed with food and to follow up with her primary care physician if symptoms continue.

## 2012-01-11 NOTE — Discharge Instructions (Signed)
Take your medications as directed and make sure you take them with food.

## 2012-01-11 NOTE — ED Notes (Signed)
Patient with sudden onset of dizziness.  Patient hand grips equal, no facial droop, speech clear.  Patient does have some weakness of left leg due to pinched nerve in back.  Patient states that this is normal for her.  Patient denies any nausea or vomiting at this time.

## 2012-05-11 ENCOUNTER — Other Ambulatory Visit (HOSPITAL_COMMUNITY): Payer: Self-pay | Admitting: Internal Medicine

## 2012-05-11 DIAGNOSIS — M79606 Pain in leg, unspecified: Secondary | ICD-10-CM

## 2012-05-11 DIAGNOSIS — R531 Weakness: Secondary | ICD-10-CM

## 2012-05-11 DIAGNOSIS — R109 Unspecified abdominal pain: Secondary | ICD-10-CM

## 2012-05-15 ENCOUNTER — Ambulatory Visit (HOSPITAL_COMMUNITY)
Admission: RE | Admit: 2012-05-15 | Discharge: 2012-05-15 | Disposition: A | Discharge status: Home / Self Care | Payer: Medicaid Other | Source: Ambulatory Visit | Attending: Internal Medicine | Admitting: Internal Medicine

## 2012-05-15 DIAGNOSIS — Z9089 Acquired absence of other organs: Secondary | ICD-10-CM | POA: Insufficient documentation

## 2012-05-15 DIAGNOSIS — R109 Unspecified abdominal pain: Secondary | ICD-10-CM | POA: Insufficient documentation

## 2012-05-15 DIAGNOSIS — R531 Weakness: Secondary | ICD-10-CM

## 2012-05-15 DIAGNOSIS — M79606 Pain in leg, unspecified: Secondary | ICD-10-CM

## 2012-08-09 ENCOUNTER — Encounter (HOSPITAL_COMMUNITY): Payer: Self-pay

## 2012-08-09 ENCOUNTER — Emergency Department (HOSPITAL_COMMUNITY)
Admission: EM | Admit: 2012-08-09 | Discharge: 2012-08-09 | Disposition: A | Discharge status: Home / Self Care | Payer: Medicaid Other | Source: Home / Self Care | Attending: Emergency Medicine | Admitting: Emergency Medicine

## 2012-08-09 DIAGNOSIS — M549 Dorsalgia, unspecified: Secondary | ICD-10-CM

## 2012-08-09 MED ORDER — TRAMADOL HCL 50 MG PO TABS: 50.0000 mg | ORAL_TABLET | Freq: Three times a day (TID) | ORAL | Status: DC | PRN

## 2012-08-09 MED ORDER — KETOROLAC TROMETHAMINE 60 MG/2ML IM SOLN
60.0000 mg | Freq: Once | INTRAMUSCULAR | Status: AC
  Administered 2012-08-09: 60 mg via INTRAMUSCULAR

## 2012-08-09 MED ORDER — HYDROCODONE-ACETAMINOPHEN 5-500 MG PO TABS: 1.0000 | ORAL_TABLET | Freq: Four times a day (QID) | ORAL | Status: DC | PRN

## 2012-08-09 MED ORDER — KETOROLAC TROMETHAMINE 60 MG/2ML IM SOLN
INTRAMUSCULAR | Status: AC
  Filled 2012-08-09: qty 2

## 2012-08-09 NOTE — ED Provider Notes (Signed)
History     CSN: [id number]  Arrival date & time 08/09/12  1754   None     Chief Complaint  Patient presents with  . Back Pain    (Consider location/radiation/quality/duration/timing/severity/associated sxs/prior treatment) Patient is a 36 y.o. female presenting with back pain. The history is provided by the patient.  Back Pain  This is a chronic problem. The current episode started more than 1 week ago. The problem occurs daily. The problem has not changed since onset.The pain is associated with no known injury. The pain is present in the lumbar spine and sacro-iliac joint. The quality of the pain is described as aching. The pain radiates to the left thigh. The pain is moderate. The symptoms are aggravated by bending, twisting and certain positions. The pain is the same all the time. Stiffness is present in the morning. Associated symptoms include numbness, paresthesias and tingling. Pertinent negatives include no abdominal pain, no bowel incontinence, no perianal numbness, no bladder incontinence, no dysuria, no pelvic pain, no leg pain and no weakness. She has tried NSAIDs and analgesics for the symptoms. The treatment provided moderate relief. Risk factors include obesity, lack of exercise, poor posture and a sedentary lifestyle.  [facility] patient, ran out of medication.  Past Medical History  Diagnosis Date  . Asthma   . OSA (obstructive sleep apnea)   . History of blood clots   . Morbid obesity     History reviewed. No pertinent past surgical history.  History reviewed. No pertinent family history.  History  Substance Use Topics  . Smoking status: Never Smoker   . Smokeless tobacco: Not on file  . Alcohol Use: No    OB History    Grav Para Term Preterm Abortions TAB SAB Ect Mult Living                  Review of Systems  Constitutional: Negative.   Respiratory: Negative.   Cardiovascular: Negative.   Gastrointestinal: Negative.  Negative for abdominal pain  and bowel incontinence.  Genitourinary: Negative for bladder incontinence, dysuria and pelvic pain.  Musculoskeletal: Positive for back pain. Negative for myalgias, joint swelling, arthralgias and gait problem.  Neurological: Positive for tingling, numbness and paresthesias. Negative for weakness.    Allergies  Amoxicillin  Home Medications   Current Outpatient Rx  Name Route Sig Dispense Refill  . ALBUTEROL SULFATE HFA 108 (90 BASE) MCG/ACT IN AERS Inhalation Inhale 2 puffs into the lungs 2 (two) times daily as needed. For shortness of breath    . DOCUSATE SODIUM 100 MG PO CAPS Oral Take 100 mg by mouth every other day.    Marland Kitchen FLUTICASONE-SALMETEROL 250-50 MCG/DOSE IN AEPB Inhalation Inhale 1 puff into the lungs every 12 (twelve) hours.    Marland Kitchen HYDROCODONE-ACETAMINOPHEN 5-500 MG PO TABS Oral Take 1 tablet by mouth every 6 (six) hours as needed. For pain 45 tablet 0  . IBUPROFEN 200 MG PO TABS Oral Take 800 mg by mouth every 6 (six) hours as needed. For pain    . LOSARTAN POTASSIUM 50 MG PO TABS Oral Take 50 mg by mouth daily.    . TRAMADOL HCL 50 MG PO TABS Oral Take 1 tablet (50 mg total) by mouth every 8 (eight) hours as needed. For pain 30 tablet 0    BP 140/85  Pulse 111  Temp 99.3 F (37.4 C) (Oral)  Resp 19  SpO2 97%  Physical Exam  Nursing note and vitals reviewed. Constitutional: She is oriented  to person, place, and time. Vital signs are normal. She appears well-developed and well-nourished. She is active and cooperative.       Morbidly obese  HENT:  Head: Normocephalic.  Eyes: Conjunctivae normal are normal. Pupils are equal, round, and reactive to light. No scleral icterus.  Neck: Trachea normal. Neck supple.  Cardiovascular: Normal rate and regular rhythm.   Pulmonary/Chest: Effort normal and breath sounds normal.  Musculoskeletal:       Cervical back: Normal.       Thoracic back: Normal.       Lumbar back: She exhibits tenderness. She exhibits normal range of  motion, no bony tenderness, no swelling, no edema, no deformity and no spasm.       Back:  Neurological: She is alert and oriented to person, place, and time. She has normal strength and normal reflexes. No cranial nerve deficit or sensory deficit. Coordination and gait normal.  Skin: Skin is warm and dry.  Psychiatric: She has a normal mood and affect. Her speech is normal and behavior is normal. Judgment and thought content normal. Cognition and memory are normal.    ED Course  Procedures (including critical care time)  Labs Reviewed - No data to display No results found.   1. Back pain       MDM  Rx refill for medications.  Encouraged to find alternate primary care provider.  Resources provided.        Johnsie Kindred, NP 08/10/12 1220

## 2012-08-09 NOTE — ED Notes (Addendum)
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

## 2012-08-12 NOTE — ED Provider Notes (Signed)
Medical screening examination/treatment/procedure(s) were performed by non-physician practitioner and as supervising physician I was immediately available for consultation/collaboration.  Sarahmarie Leavey, M.D.   Denim Start C Hiliana Eilts, MD 08/12/12 0844 

## 2012-09-25 ENCOUNTER — Encounter (HOSPITAL_COMMUNITY): Payer: Self-pay

## 2012-09-25 ENCOUNTER — Inpatient Hospital Stay (HOSPITAL_COMMUNITY)
Admission: EM | Admit: 2012-09-25 | Discharge: 2012-09-30 | DRG: 204 | Disposition: A | Discharge status: Home / Self Care | Payer: Medicaid Other | Attending: Family Medicine | Admitting: Family Medicine

## 2012-09-25 ENCOUNTER — Emergency Department (HOSPITAL_COMMUNITY)
Admission: EM | Admit: 2012-09-25 | Discharge: 2012-09-25 | Disposition: A | Discharge status: Other Acute Inpatient Hospital | Payer: Medicaid Other | Source: Home / Self Care

## 2012-09-25 ENCOUNTER — Emergency Department (HOSPITAL_COMMUNITY): Payer: Medicaid Other

## 2012-09-25 ENCOUNTER — Encounter (HOSPITAL_COMMUNITY): Payer: Self-pay | Admitting: Emergency Medicine

## 2012-09-25 ENCOUNTER — Inpatient Hospital Stay (HOSPITAL_COMMUNITY): Admit: 2012-09-25 | Payer: Medicaid Other

## 2012-09-25 DIAGNOSIS — M79606 Pain in leg, unspecified: Secondary | ICD-10-CM

## 2012-09-25 DIAGNOSIS — I10 Essential (primary) hypertension: Secondary | ICD-10-CM

## 2012-09-25 DIAGNOSIS — IMO0002 Reserved for concepts with insufficient information to code with codable children: Secondary | ICD-10-CM

## 2012-09-25 DIAGNOSIS — J45909 Unspecified asthma, uncomplicated: Secondary | ICD-10-CM | POA: Diagnosis present

## 2012-09-25 DIAGNOSIS — G4733 Obstructive sleep apnea (adult) (pediatric): Secondary | ICD-10-CM

## 2012-09-25 DIAGNOSIS — Z86718 Personal history of other venous thrombosis and embolism: Secondary | ICD-10-CM

## 2012-09-25 DIAGNOSIS — R7309 Other abnormal glucose: Secondary | ICD-10-CM

## 2012-09-25 DIAGNOSIS — Z8659 Personal history of other mental and behavioral disorders: Secondary | ICD-10-CM

## 2012-09-25 DIAGNOSIS — D509 Iron deficiency anemia, unspecified: Secondary | ICD-10-CM | POA: Diagnosis present

## 2012-09-25 DIAGNOSIS — R0602 Shortness of breath: Principal | ICD-10-CM | POA: Diagnosis present

## 2012-09-25 DIAGNOSIS — Z9119 Patient's noncompliance with other medical treatment and regimen: Secondary | ICD-10-CM

## 2012-09-25 DIAGNOSIS — M67919 Unspecified disorder of synovium and tendon, unspecified shoulder: Secondary | ICD-10-CM

## 2012-09-25 DIAGNOSIS — J309 Allergic rhinitis, unspecified: Secondary | ICD-10-CM

## 2012-09-25 DIAGNOSIS — D473 Essential (hemorrhagic) thrombocythemia: Secondary | ICD-10-CM | POA: Diagnosis present

## 2012-09-25 DIAGNOSIS — R9389 Abnormal findings on diagnostic imaging of other specified body structures: Secondary | ICD-10-CM

## 2012-09-25 DIAGNOSIS — R000 Tachycardia, unspecified: Secondary | ICD-10-CM | POA: Diagnosis present

## 2012-09-25 DIAGNOSIS — R002 Palpitations: Secondary | ICD-10-CM

## 2012-09-25 DIAGNOSIS — R609 Edema, unspecified: Secondary | ICD-10-CM

## 2012-09-25 DIAGNOSIS — R82998 Other abnormal findings in urine: Secondary | ICD-10-CM

## 2012-09-25 DIAGNOSIS — F329 Major depressive disorder, single episode, unspecified: Secondary | ICD-10-CM

## 2012-09-25 DIAGNOSIS — R300 Dysuria: Secondary | ICD-10-CM | POA: Diagnosis present

## 2012-09-25 DIAGNOSIS — Z91199 Patient's noncompliance with other medical treatment and regimen due to unspecified reason: Secondary | ICD-10-CM

## 2012-09-25 DIAGNOSIS — N920 Excessive and frequent menstruation with regular cycle: Secondary | ICD-10-CM

## 2012-09-25 DIAGNOSIS — F3289 Other specified depressive episodes: Secondary | ICD-10-CM

## 2012-09-25 DIAGNOSIS — N9089 Other specified noninflammatory disorders of vulva and perineum: Secondary | ICD-10-CM

## 2012-09-25 DIAGNOSIS — M719 Bursopathy, unspecified: Secondary | ICD-10-CM

## 2012-09-25 DIAGNOSIS — Z6841 Body Mass Index (BMI) 40.0 and over, adult: Secondary | ICD-10-CM

## 2012-09-25 DIAGNOSIS — I2699 Other pulmonary embolism without acute cor pulmonale: Secondary | ICD-10-CM | POA: Diagnosis present

## 2012-09-25 DIAGNOSIS — D649 Anemia, unspecified: Secondary | ICD-10-CM | POA: Diagnosis present

## 2012-09-25 DIAGNOSIS — F410 Panic disorder [episodic paroxysmal anxiety] without agoraphobia: Secondary | ICD-10-CM

## 2012-09-25 DIAGNOSIS — D573 Sickle-cell trait: Secondary | ICD-10-CM | POA: Diagnosis present

## 2012-09-25 DIAGNOSIS — Z79899 Other long term (current) drug therapy: Secondary | ICD-10-CM

## 2012-09-25 DIAGNOSIS — L509 Urticaria, unspecified: Secondary | ICD-10-CM

## 2012-09-25 LAB — POCT I-STAT, CHEM 8
BUN: 15 mg/dL (ref 6–23)
Calcium, Ion: 1.14 mmol/L (ref 1.12–1.23)
Creatinine, Ser: 1 mg/dL (ref 0.50–1.10)
Glucose, Bld: 123 mg/dL — ABNORMAL HIGH (ref 70–99)
TCO2: 25 mmol/L (ref 0–100)

## 2012-09-25 LAB — CBC WITH DIFFERENTIAL/PLATELET
Basophils Relative: 0 % (ref 0–1)
Eosinophils Absolute: 0.4 10*3/uL (ref 0.0–0.7)
HCT: 29.1 % — ABNORMAL LOW (ref 36.0–46.0)
Hemoglobin: 8.5 g/dL — ABNORMAL LOW (ref 12.0–15.0)
Lymphs Abs: 2.1 10*3/uL (ref 0.7–4.0)
MCH: 16.5 pg — ABNORMAL LOW (ref 26.0–34.0)
MCHC: 29.2 g/dL — ABNORMAL LOW (ref 30.0–36.0)
Monocytes Absolute: 0.7 10*3/uL (ref 0.1–1.0)
Neutro Abs: 6.7 10*3/uL (ref 1.7–7.7)
RDW: 19.8 % — ABNORMAL HIGH (ref 11.5–15.5)

## 2012-09-25 MED ORDER — IPRATROPIUM BROMIDE 0.02 % IN SOLN: 0.5000 mg | RESPIRATORY_TRACT | Status: DC

## 2012-09-25 MED ORDER — FLUTICASONE-SALMETEROL 250-50 MCG/DOSE IN AEPB: 1.0000 | INHALATION_SPRAY | Freq: Two times a day (BID) | RESPIRATORY_TRACT | Status: DC

## 2012-09-25 MED ORDER — LOSARTAN POTASSIUM 100 MG PO TABS: 100.0000 mg | ORAL_TABLET | Freq: Every day | ORAL | Status: DC

## 2012-09-25 MED ORDER — SODIUM CHLORIDE 0.9 % IV BOLUS (SEPSIS): 500.0000 mL | Freq: Once | INTRAVENOUS | Status: DC

## 2012-09-25 MED ORDER — HEPARIN BOLUS VIA INFUSION
6000.0000 [IU] | Freq: Once | INTRAVENOUS | Status: AC
  Administered 2012-09-25: 6000 [IU] via INTRAVENOUS

## 2012-09-25 MED ORDER — CETIRIZINE HCL 10 MG PO CAPS: 1.0000 | ORAL_CAPSULE | Freq: Every day | ORAL | Status: DC

## 2012-09-25 MED ORDER — HEPARIN (PORCINE) IN NACL 100-0.45 UNIT/ML-% IJ SOLN
2350.0000 [IU]/h | INTRAMUSCULAR | Status: DC
  Administered 2012-09-25: 1800 [IU]/h via INTRAVENOUS
  Administered 2012-09-26 (×2): 2100 [IU]/h via INTRAVENOUS
  Filled 2012-09-25 (×4): qty 250

## 2012-09-25 MED ORDER — HYDROCODONE-ACETAMINOPHEN 5-500 MG PO TABS: 1.0000 | ORAL_TABLET | Freq: Two times a day (BID) | ORAL | Status: DC | PRN

## 2012-09-25 MED ORDER — AZITHROMYCIN 250 MG PO TABS: ORAL_TABLET | ORAL | Status: DC

## 2012-09-25 MED ORDER — ALBUTEROL SULFATE (5 MG/ML) 0.5% IN NEBU: 2.5000 mg | INHALATION_SOLUTION | RESPIRATORY_TRACT | Status: DC

## 2012-09-25 MED ORDER — ALBUTEROL SULFATE (5 MG/ML) 0.5% IN NEBU
INHALATION_SOLUTION | RESPIRATORY_TRACT | Status: AC
  Filled 2012-09-25: qty 1

## 2012-09-25 MED ORDER — TRAMADOL HCL 50 MG PO TABS: 50.0000 mg | ORAL_TABLET | Freq: Three times a day (TID) | ORAL | Status: DC | PRN

## 2012-09-25 NOTE — Progress Notes (Signed)
ANTICOAGULATION CONSULT NOTE - Initial Consult  Pharmacy Consult for Heparin Indication: pulmonary embolus  Allergies  Allergen Reactions  . Fish Allergy Hives and Shortness Of Breath    Gets dizzy  . Amoxicillin     REACTION: hives  . Other     Allergic to nuts. Causes mouth to itch, breaks out in hives, trouble breathing  . Strawberry     Makes mouth itch    Patient Measurements: Height: 5\' 8"  (172.7 cm) Weight: 464 lb (210.469 kg) IBW/kg (Calculated) : 63.9  Heparin Dosing Weight: 124 kg  Vital Signs: Temp: 98.9 F (37.2 C) (11/25 1834) Temp src: Oral (11/25 1834) BP: 145/95 mmHg (11/25 1926) Pulse Rate: 121  (11/25 1926)  Labs:  Basename 09/25/12 2025 09/25/12 2022  HGB 10.2* 8.5*  HCT 30.0* 29.1*  PLT -- 589*  APTT -- --  LABPROT -- --  INR -- --  HEPARINUNFRC -- --  CREATININE 1.00 --  CKTOTAL -- --  CKMB -- --  TROPONINI -- --    Estimated Creatinine Clearance: 149 ml/min (by C-G formula based on Cr of 1).   Medical History: Past Medical History  Diagnosis Date  . Asthma   . OSA (obstructive sleep apnea)   . History of blood clots   . Morbid obesity    Assessment:  Hx PE x 2 (2006 and 2007);  Patient relates being off Coumadin since 2008.  She has had Lovenox and Coumadin in the past. No hx bleeding, but anemic while on anticoagulants and currently. Bilateral LE swelling, dopplers negative for DVT.  Noted some chest pain and cough; unable to do CTA or VQ scan due to size. D-dimer 0.60. To begin IV heparin.  Goal of Therapy:  Heparin level 0.3-0.7 units/ml Monitor platelets by anticoagulation protocol: Yes   Plan:   Heparin 6000 units IV bolus, then  Heparin drip to begin at 1800 units/hr.  Heparin level ~6 hours after drip begins.  Daily heparin level and CBC while on heparin.  Dennie Fetters, [place] Pager: [phone] 09/25/2012,10:04 PM

## 2012-09-25 NOTE — Progress Notes (Signed)
Bilateral:  No evidence of DVT or superficial thrombosis.  Left:  There appears to be a Baker's cyst.   

## 2012-09-25 NOTE — ED Notes (Signed)
Patient states coughing up yellow mucus , sore throat Hx of asthma and allergies. Also requesting medication for anxiety .Patient states father recently placed on hospice with a poor diagnosis and finds herself to be more anxious

## 2012-09-25 NOTE — ED Notes (Signed)
Admitting MD at bedside.

## 2012-09-25 NOTE — ED Provider Notes (Signed)
History     CSN: [id number]  Arrival date & time 09/25/12  1535   None     Chief Complaint  Patient presents with  . Chest Pain    wheezing, cough    (Consider location/radiation/quality/duration/timing/severity/associated sxs/prior treatment) HPI Pt reports that she started about a week ago with cough, ST, fever, wheezing, SOB, phlegm production, chest congestion, legs cramping, she says that she has been driving 2 hours at a time to see her sick father and having pain in legs.  She is having leg cramps and having a history of DVT and PE x 2.  She is denying SOB.    Past Medical History  Diagnosis Date  . Asthma   . OSA (obstructive sleep apnea)   . History of blood clots   . Morbid obesity     History reviewed. No pertinent past surgical history.  No family history on file.  History  Substance Use Topics  . Smoking status: Never Smoker   . Smokeless tobacco: Not on file  . Alcohol Use: No    OB History    Grav Para Term Preterm Abortions TAB SAB Ect Mult Living                  Review of Systems  Constitutional: Negative for fever, chills, fatigue and unexpected weight change.  HENT: Negative.   Eyes: Negative.   Respiratory: Positive for cough and wheezing. Negative for apnea, chest tightness and shortness of breath.   Cardiovascular: Positive for chest pain. Negative for palpitations.  Gastrointestinal: Negative for abdominal distention.  Genitourinary: Negative.   Musculoskeletal: Negative.   Neurological: Positive for dizziness.  Hematological: Negative.   Psychiatric/Behavioral: Negative.     Allergies  Amoxicillin  Home Medications   Current Outpatient Rx  Name  Route  Sig  Dispense  Refill  . ALBUTEROL SULFATE HFA 108 (90 BASE) MCG/ACT IN AERS   Inhalation   Inhale 2 puffs into the lungs 2 (two) times daily as needed. For shortness of breath         . FLUTICASONE-SALMETEROL 250-50 MCG/DOSE IN AEPB   Inhalation   Inhale 1 puff into the  lungs every 12 (twelve) hours.         . IBUPROFEN 200 MG PO TABS   Oral   Take 800 mg by mouth every 6 (six) hours as needed. For pain         . LOSARTAN POTASSIUM 50 MG PO TABS   Oral   Take 50 mg by mouth daily.         Marland Kitchen DOCUSATE SODIUM 100 MG PO CAPS   Oral   Take 100 mg by mouth every other day.         Marland Kitchen HYDROCODONE-ACETAMINOPHEN 5-500 MG PO TABS   Oral   Take 1 tablet by mouth every 6 (six) hours as needed. For pain   45 tablet   0   . TRAMADOL HCL 50 MG PO TABS   Oral   Take 1 tablet (50 mg total) by mouth every 8 (eight) hours as needed. For pain   30 tablet   0     Pulse 109  Temp 99.2 F (37.3 C) (Oral)  Resp 22  SpO2 100%  LMP 09/22/2012  Physical Exam  Constitutional: She is oriented to person, place, and time. She appears well-developed and well-nourished. No distress.  HENT:  Head: Normocephalic and atraumatic.  Nose: Mucosal edema and rhinorrhea present. No septal deviation. No  epistaxis.  No foreign bodies.  Eyes: EOM are normal. Pupils are equal, round, and reactive to light.  Neck: Normal range of motion. Neck supple. No JVD present. No thyromegaly present.  Cardiovascular: Normal rate, regular rhythm and normal heart sounds.   No murmur heard. Pulmonary/Chest: Effort normal. No respiratory distress. She has wheezes. She has rales. She exhibits no tenderness.  Abdominal: Soft. Bowel sounds are normal.  Musculoskeletal: Normal range of motion. She exhibits no edema and no tenderness.  Neurological: She is alert and oriented to person, place, and time. No cranial nerve deficit.  Skin: Skin is warm and dry.  Psychiatric: She has a normal mood and affect. Her behavior is normal. Judgment and thought content normal.    ED Course  Procedures (including critical care time)  Labs Reviewed - No data to display No results found.   No diagnosis found.    MDM  URI Asthma Morbid obesity Chronic knee pain Weight bearing  arthropathy History of PEs and DVTs   z pack Nebulizer treatment Advair Ventolin HFA REfilled hydrocodone Refilled tramadol Send for doppler US of leg bilateral to R/O DVT  Pt ended up going to ER and is being evaluated in the ED for further management      Cleora Fleet, MD 09/25/12 2136

## 2012-09-25 NOTE — ED Notes (Signed)
Previous and current EKG given to Dr. Rubin Payor, and copy placed in pt chart.

## 2012-09-25 NOTE — ED Provider Notes (Signed)
Assumed care of patient in the CDU from Dr. Rubin Payor.  Patient with a history of PE presents today with pain and swelling of her right leg and also SOB and cough.  Patient is currently not on any anticoagulation therapy.  Patient's Doppler of RLE was negative for DVT.  No acute findings on CXR.  CBC did show that the patient is anemic with a Hemoglobin of 8.5.  Patient reports that she is always anemic and that she has very heavy periods.  She started her period 2 days ago.  Patient was also found to have an elevated D-Dimer of 0.60.  Patient is tachycardic, but is not hypoxic.  Patient has been unable to have CTA and VQ in the past due to body habitus.  Patient is morbidly obese.  Reassessed patient.  Patient continues to be tachycardic at this time.  No acute distress.  Heart regular rhythm, tachycardia, Lungs CTAB, Abdomen soft and nontender, left lower extremity edema.      [facility] called for admission for possible PE.  IV Heparin has been ordered to treat PE emperically.  Spoke with Dr. Toniann Fail who has agreed to admit the patient.   Michelle Lux Crabtree, PA-C 09/25/12 2333

## 2012-09-25 NOTE — ED Notes (Signed)
PA at bedside.

## 2012-09-25 NOTE — ED Provider Notes (Addendum)
History   This chart was scribed for [employer]. Rubin Payor, MD by Sofie Rower, ED Scribe. The patient was seen in room TR10C/TR10C and the patient's care was started at 6:45PM.     CSN: [id number]  Arrival date & time 09/25/12  1818   First MD Initiated Contact with Patient 09/25/12 1845      Chief Complaint  Patient presents with  . Leg Pain    (Consider location/radiation/quality/duration/timing/severity/associated sxs/prior treatment) The history is provided by the patient. No language interpreter was used.    [name] is a 36 y.o. 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  Associated symptoms include swelling located at the bilateral lower extremities. The pt reports she has recently been experiencing an increased level of anxiety due to her father being placed on hospice care. Patient has had some chest pain and cough. She states that this does not feel like her previous blood clots in that she got better with her breathing treatment.  The pt does not smoke or drink alcohol.    Past Medical History  Diagnosis Date  . Asthma   . OSA (obstructive sleep apnea)   . History of blood clots   . Morbid obesity     History reviewed. No pertinent past surgical history.  History reviewed. No pertinent family history.  History  Substance Use Topics  . Smoking status: Never Smoker   . Smokeless tobacco: Not on file  . Alcohol Use: No    OB History    Grav Para Term Preterm Abortions TAB SAB Ect Mult Living                  Review of Systems  Constitutional: Negative for fatigue.  Respiratory: Positive for cough and shortness of breath.   Cardiovascular: Positive for leg swelling.  Gastrointestinal: Negative for abdominal pain.  Genitourinary:  Negative for flank pain and pelvic pain.  Musculoskeletal: Positive for back pain. Negative for gait problem.  Skin: Negative for pallor and rash.  Neurological: Negative for numbness.  Psychiatric/Behavioral: The patient is nervous/anxious.     Allergies  Amoxicillin  Home Medications   Current Outpatient Rx  Name  Route  Sig  Dispense  Refill  . ALBUTEROL SULFATE HFA 108 (90 BASE) MCG/ACT IN AERS   Inhalation   Inhale 2 puffs into the lungs 2 (two) times daily as needed. For shortness of breath         . AZITHROMYCIN 250 MG PO TABS      Take as directed   6 each   0   . CETIRIZINE HCL 10 MG PO CAPS   Oral   Take 1 capsule (10 mg total) by mouth daily.   30 capsule   3   . FLUTICASONE-SALMETEROL 250-50 MCG/DOSE IN AEPB   Inhalation   Inhale 1 puff into the lungs every 12 (twelve) hours.   60 each   4   . HYDROCODONE-ACETAMINOPHEN 5-500 MG PO TABS   Oral   Take 1 tablet by mouth every 12 (twelve) hours as needed for pain. For pain   60 tablet   0   . LOSARTAN POTASSIUM 100 MG PO TABS   Oral   Take 1 tablet (100 mg total) by mouth daily.         Marland Kitchen  TRAMADOL HCL 50 MG PO TABS   Oral   Take 1 tablet (50 mg total) by mouth every 8 (eight) hours as needed for pain. For pain   90 tablet   0     BP 153/103  Pulse 128  Temp 98.9 F (37.2 C) (Oral)  Resp 18  SpO2 98%  LMP 09/22/2012  Physical Exam  Nursing note and vitals reviewed. Constitutional: She is oriented to person, place, and time. She appears well-developed and well-nourished. No distress.       Morbidly obese.   HENT:  Head: Normocephalic and atraumatic.  Eyes: EOM are normal. Pupils are equal, round, and reactive to light.  Neck: Neck supple. No tracheal deviation present.  Cardiovascular: Normal rate, regular rhythm and normal heart sounds.   Pulmonary/Chest: Effort normal and breath sounds normal. No respiratory distress.  Abdominal: Soft. She exhibits no distension. There is no  tenderness.  Musculoskeletal: Normal range of motion. She exhibits edema. She exhibits no tenderness.       Pitting edema located at the left lower extremity.   Neurological: She is alert and oriented to person, place, and time. No sensory deficit.  Skin: Skin is warm and dry.  Psychiatric: She has a normal mood and affect. Her behavior is normal.    ED Course  Procedures (including critical care time)  6:52 PM- Treatment plan discussed with patient. Pt agrees with treatment.     Labs Reviewed - No data to display No results found.   No diagnosis found.    MDM  Patient sent in with cough with some mucus. History of previous PE x2. Sent for doppler from Urgent care. Also has some anxiety. Negative doppler, but continued tachycardia. Will get las, including d-dimer. Has been too obese previously for CTA or V/Q scan. Has been empirically treated in the past. Reviewing notes it appears as if she stopped the coumadin herself.     I personally performed the services described in this documentation, which was scribed in my presence. The recorded information has been reviewed and is accurate.     Date: 09/25/2012  Rate: 114  Rhythm: sinus tachycardia  QRS Axis: normal  Intervals: normal  ST/T Wave abnormalities: nonspecific T wave changes  Conduction Disutrbances:none  Narrative Interpretation: tachycardia is new.  Old EKG Reviewed: changes noted    Juliet Rude. Rubin Payor, MD 09/25/12 1942  Juliet Rude. Rubin Payor, MD 09/25/12 2235

## 2012-09-25 NOTE — H&P (Signed)
[name] is an 36 y.o. female.   Patient was seen and examined on September 25, 2012. PCP - used to be [facility]. Chief Complaint: Shortness of breath and palpitations. HPI: 36 year-old female with history of morbid obesity, bronchial asthma, OSA noncompliant with CPAP and previous history of PE and DVT presents with complaints of shortness of breath and symptoms of upper respiratory tract infection. She had gone to the urgent care Center and was referred to the ER because of patient complaining of left calf pain. As per the ER physician Doppler done in the ER did not show any DVT. Patient is morbidly obese and due to her weight and size CT angiogram of the chest was not able to be done to rule out PE. Patient states that her symptoms started 2 days ago with shortness of breath and palpitation with productive cough. Later she tried over-the-counter medications which did not help and she decided to come to the ER. Denies any chest pain fever chills. In the ER patient was given initially nebulizer treatment. Chest x-ray does not show any infiltrates. At this time patient has been empirically started on IV heparin for possible PE. Her d-dimer was mildly elevated.  Past Medical History  Diagnosis Date  . Asthma   . OSA (obstructive sleep apnea)   . History of blood clots   . Morbid obesity     Past Surgical History  Procedure Date  . Cholecystectomy   . Tubal ligation   . Cesarean section     Family History  Problem Relation Age of Onset  . Asthma Mother   . Leukemia Father    Social History:  reports that she has never smoked. She does not have any smokeless tobacco history on file. She reports that she does not drink alcohol or use illicit drugs.  Allergies:  Allergies  Allergen Reactions  . Fish Allergy Hives and Shortness Of Breath    Gets dizzy  . Amoxicillin     REACTION: hives  . Other     Allergic to nuts. Causes mouth to itch, breaks out in hives, trouble breathing  .  Strawberry     Makes mouth itch     (Not in a hospital admission)  Results for orders placed during the hospital encounter of 09/25/12 (from the past 48 hour(s))  CBC WITH DIFFERENTIAL     Status: Abnormal   Collection Time   09/25/12  8:22 PM      Component Value Range Comment   WBC 9.9  4.0 - 10.5 K/uL    RBC 5.14 (*) 3.87 - 5.11 MIL/uL    Hemoglobin 8.5 (*) 12.0 - 15.0 g/dL    HCT 16.1 (*) 09.6 - 46.0 %    MCV 56.6 (*) 78.0 - 100.0 fL    MCH 16.5 (*) 26.0 - 34.0 pg    MCHC 29.2 (*) 30.0 - 36.0 g/dL    RDW 04.5 (*) 40.9 - 15.5 %    Platelets 589 (*) 150 - 400 K/uL    Neutrophils Relative 68  43 - 77 %    Lymphocytes Relative 21  12 - 46 %    Monocytes Relative 7  3 - 12 %    Eosinophils Relative 4  0 - 5 %    Basophils Relative 0  0 - 1 %    Neutro Abs 6.7  1.7 - 7.7 K/uL    Lymphs Abs 2.1  0.7 - 4.0 K/uL    Monocytes  Absolute 0.7  0.1 - 1.0 K/uL    Eosinophils Absolute 0.4  0.0 - 0.7 K/uL    Basophils Absolute 0.0  0.0 - 0.1 K/uL    RBC Morphology POLYCHROMASIA PRESENT     D-DIMER, QUANTITATIVE     Status: Abnormal   Collection Time   09/25/12  8:22 PM      Component Value Range Comment   D-Dimer, Quant 0.60 (*) 0.00 - 0.48 ug/mL-FEU   POCT I-STAT, CHEM 8     Status: Abnormal   Collection Time   09/25/12  8:25 PM      Component Value Range Comment   Sodium 139  135 - 145 mEq/L    Potassium 3.7  3.5 - 5.1 mEq/L    Chloride 103  96 - 112 mEq/L    BUN 15  6 - 23 mg/dL    Creatinine, Ser 6.04  0.50 - 1.10 mg/dL    Glucose, Bld 540 (*) 70 - 99 mg/dL    Calcium, Ion 9.81  1.91 - 1.23 mmol/L    TCO2 25  0 - 100 mmol/L    Hemoglobin 10.2 (*) 12.0 - 15.0 g/dL    HCT 47.8 (*) 29.5 - 46.0 %    Dg Chest 2 View  09/25/2012  *RADIOLOGY REPORT*  Clinical Data: Shortness of breath.  Hypertension.  CHEST - 2 VIEW  Comparison: Chest x-ray 10/19/2006.  Findings: Lung volumes are normal.  No consolidative airspace disease.  No pleural effusions.  No pneumothorax.  No pulmonary  nodule or mass noted.  Pulmonary vasculature and the cardiomediastinal silhouette are within normal limits.  IMPRESSION: 1. No radiographic evidence of acute cardiopulmonary disease.   Original Report Authenticated By: Trudie Reed, M.D.     Review of Systems  Constitutional: Negative.   HENT: Positive for congestion.   Eyes: Negative.   Respiratory: Positive for cough, sputum production and shortness of breath.   Cardiovascular: Negative.   Gastrointestinal: Negative.   Genitourinary: Negative.   Musculoskeletal: Negative.   Skin: Negative.   Neurological: Negative.   Endo/Heme/Allergies: Negative.     Blood pressure 121/71, pulse 100, temperature 98.9 F (37.2 C), temperature source Oral, resp. rate 13, height 5\' 8"  (1.727 m), weight 210.469 kg (464 lb), last menstrual period 09/22/2012, SpO2 100.00%. Physical Exam  Constitutional: She is oriented to person, place, and time. She appears well-developed and well-nourished. No distress.  HENT:  Head: Normocephalic and atraumatic.  Right Ear: External ear normal.  Left Ear: External ear normal.  Nose: Nose normal.  Mouth/Throat: Oropharynx is clear and moist. No oropharyngeal exudate.  Eyes: Conjunctivae normal are normal. Pupils are equal, round, and reactive to light. Right eye exhibits no discharge. Left eye exhibits no discharge. No scleral icterus.  Neck: Normal range of motion. Neck supple.  Cardiovascular: Regular rhythm.        Tachycardia.  Respiratory: Effort normal and breath sounds normal. No respiratory distress. She has no wheezes. She has no rales.  GI: Soft. Bowel sounds are normal. She exhibits no distension. There is no tenderness. There is no rebound.  Musculoskeletal: She exhibits no edema and no tenderness.  Neurological: She is alert and oriented to person, place, and time.       Moves all extremities.  Skin: She is not diaphoretic.     Assessment/Plan #1. Shortness of breath palpitation and previous  history of PE and DVT - patient at this time has been empirically started on IV heparin. Unable to do CAT scan  of the chest to rule out PE because of the weight and size of the patient. Patient's symptoms could also be from bronchitis. But given her history of PE and DVT at this time we will continue heparin and consult pulmonologist for their opinion. We'll also check BNP and troponin. #2. History of asthma with possible bronchitis - patient has been placed on Zithromax. Continue nebulizers. Patient is not presently actively wheezing. #3. Hypertension - continue home medications. #4. History of OSA - noncompliant with CPAP. #5. Chronic iron deficiency anemia - hemoglobin appears at baseline.  CODE STATUS - full code.    Eduard Clos 09/25/2012, 10:37 PM

## 2012-09-25 NOTE — ED Notes (Signed)
Pt c/o right leg pain with swelling; pt sent from [facility]; pt with hx of blood clots in past and sent here for further eval

## 2012-09-26 DIAGNOSIS — J45909 Unspecified asthma, uncomplicated: Secondary | ICD-10-CM

## 2012-09-26 DIAGNOSIS — G4733 Obstructive sleep apnea (adult) (pediatric): Secondary | ICD-10-CM

## 2012-09-26 DIAGNOSIS — R0602 Shortness of breath: Principal | ICD-10-CM

## 2012-09-26 DIAGNOSIS — R002 Palpitations: Secondary | ICD-10-CM

## 2012-09-26 DIAGNOSIS — I2699 Other pulmonary embolism without acute cor pulmonale: Secondary | ICD-10-CM

## 2012-09-26 DIAGNOSIS — R609 Edema, unspecified: Secondary | ICD-10-CM

## 2012-09-26 LAB — TROPONIN I
Troponin I: <0.3 ng/mL (ref <0.30)
Troponin I: <0.3 ng/mL (ref <0.30)

## 2012-09-26 LAB — CBC WITH DIFFERENTIAL/PLATELET
Basophils Absolute: 0 10*3/uL (ref 0.0–0.1)
Eosinophils Absolute: 0.4 10*3/uL (ref 0.0–0.7)
Lymphocytes Relative: 36 % (ref 12–46)
MCHC: 29.1 g/dL — ABNORMAL LOW (ref 30.0–36.0)
Neutrophils Relative %: 51 % (ref 43–77)
RDW: 19.7 % — ABNORMAL HIGH (ref 11.5–15.5)

## 2012-09-26 LAB — BASIC METABOLIC PANEL
BUN: 13 mg/dL (ref 6–23)
Creatinine, Ser: 0.98 mg/dL (ref 0.50–1.10)
GFR calc Af Amer: 84 mL/min — ABNORMAL LOW (ref >90)
GFR calc non Af Amer: 73 mL/min — ABNORMAL LOW (ref >90)

## 2012-09-26 LAB — TSH: TSH: 1.431 u[IU]/mL (ref 0.350–4.500)

## 2012-09-26 MED ORDER — ACETAMINOPHEN 650 MG RE SUPP: 650.0000 mg | Freq: Four times a day (QID) | RECTAL | Status: DC | PRN

## 2012-09-26 MED ORDER — ZOLPIDEM TARTRATE 5 MG PO TABS
5.0000 mg | ORAL_TABLET | Freq: Every evening | ORAL | Status: DC | PRN
  Administered 2012-09-26 – 2012-09-28 (×2): 5 mg via ORAL
  Filled 2012-09-26 (×2): qty 1

## 2012-09-26 MED ORDER — WARFARIN - PHARMACIST DOSING INPATIENT: Freq: Every day | Status: DC

## 2012-09-26 MED ORDER — BUDESONIDE 0.5 MG/2ML IN SUSP
0.5000 mg | Freq: Two times a day (BID) | RESPIRATORY_TRACT | Status: DC
  Administered 2012-09-26 – 2012-09-30 (×9): 0.5 mg via RESPIRATORY_TRACT
  Filled 2012-09-26 (×13): qty 2

## 2012-09-26 MED ORDER — ACETAMINOPHEN 325 MG PO TABS
650.0000 mg | ORAL_TABLET | Freq: Four times a day (QID) | ORAL | Status: DC | PRN
  Administered 2012-09-27 – 2012-09-29 (×5): 650 mg via ORAL
  Filled 2012-09-26 (×5): qty 2

## 2012-09-26 MED ORDER — HYDROCODONE-ACETAMINOPHEN 5-325 MG PO TABS
1.0000 | ORAL_TABLET | Freq: Two times a day (BID) | ORAL | Status: DC | PRN
  Administered 2012-09-26 – 2012-09-30 (×7): 1 via ORAL
  Filled 2012-09-26 (×8): qty 1

## 2012-09-26 MED ORDER — SODIUM CHLORIDE 0.9 % IJ SOLN
3.0000 mL | Freq: Two times a day (BID) | INTRAMUSCULAR | Status: DC
  Administered 2012-09-26 – 2012-09-30 (×7): 3 mL via INTRAVENOUS

## 2012-09-26 MED ORDER — AZITHROMYCIN 500 MG PO TABS
500.0000 mg | ORAL_TABLET | Freq: Every day | ORAL | Status: AC
  Administered 2012-09-27 – 2012-09-28 (×2): 500 mg via ORAL
  Filled 2012-09-26 (×2): qty 1

## 2012-09-26 MED ORDER — HEPARIN BOLUS VIA INFUSION
2500.0000 [IU] | Freq: Once | INTRAVENOUS | Status: AC
  Administered 2012-09-26: 2500 [IU] via INTRAVENOUS
  Filled 2012-09-26: qty 2500

## 2012-09-26 MED ORDER — LEVALBUTEROL HCL 0.63 MG/3ML IN NEBU
0.6300 mg | INHALATION_SOLUTION | Freq: Four times a day (QID) | RESPIRATORY_TRACT | Status: DC | PRN
  Filled 2012-09-26: qty 3

## 2012-09-26 MED ORDER — DEXTROSE 5 % IV SOLN
500.0000 mg | Freq: Once | INTRAVENOUS | Status: AC
  Administered 2012-09-26: 500 mg via INTRAVENOUS
  Filled 2012-09-26: qty 500

## 2012-09-26 MED ORDER — ONDANSETRON HCL 4 MG PO TABS
4.0000 mg | ORAL_TABLET | Freq: Four times a day (QID) | ORAL | Status: DC | PRN
  Administered 2012-09-26 – 2012-09-30 (×2): 4 mg via ORAL
  Filled 2012-09-26 (×2): qty 1

## 2012-09-26 MED ORDER — TRAMADOL HCL 50 MG PO TABS: 50.0000 mg | ORAL_TABLET | Freq: Three times a day (TID) | ORAL | Status: DC | PRN

## 2012-09-26 MED ORDER — ENOXAPARIN (LOVENOX) PATIENT EDUCATION KIT
PACK | Freq: Once | Status: DC
  Filled 2012-09-26: qty 1

## 2012-09-26 MED ORDER — WARFARIN SODIUM 10 MG PO TABS
10.0000 mg | ORAL_TABLET | ORAL | Status: AC
  Administered 2012-09-26: 10 mg via ORAL
  Filled 2012-09-26: qty 1

## 2012-09-26 MED ORDER — LORATADINE 10 MG PO TABS
10.0000 mg | ORAL_TABLET | Freq: Every day | ORAL | Status: DC
  Administered 2012-09-26 – 2012-09-30 (×5): 10 mg via ORAL
  Filled 2012-09-26 (×5): qty 1

## 2012-09-26 MED ORDER — LOSARTAN POTASSIUM 50 MG PO TABS
100.0000 mg | ORAL_TABLET | Freq: Every day | ORAL | Status: DC
  Administered 2012-09-26 – 2012-09-30 (×5): 100 mg via ORAL
  Filled 2012-09-26 (×5): qty 2

## 2012-09-26 MED ORDER — ONDANSETRON HCL 4 MG/2ML IJ SOLN
4.0000 mg | Freq: Four times a day (QID) | INTRAMUSCULAR | Status: DC | PRN
  Administered 2012-09-26: 4 mg via INTRAVENOUS
  Filled 2012-09-26: qty 2

## 2012-09-26 MED ORDER — DEXTROSE 5 % IV SOLN
500.0000 mg | INTRAVENOUS | Status: DC
  Filled 2012-09-26: qty 500

## 2012-09-26 MED ORDER — ENOXAPARIN SODIUM 150 MG/ML ~~LOC~~ SOLN
215.0000 mg | Freq: Two times a day (BID) | SUBCUTANEOUS | Status: DC
  Administered 2012-09-26 – 2012-09-27 (×2): 215 mg via SUBCUTANEOUS
  Filled 2012-09-26 (×4): qty 2

## 2012-09-26 MED ORDER — SODIUM CHLORIDE 0.9 % IJ SOLN
3.0000 mL | Freq: Two times a day (BID) | INTRAMUSCULAR | Status: DC
  Administered 2012-09-28: 3 mL via INTRAVENOUS

## 2012-09-26 MED ORDER — LEVALBUTEROL HCL 0.63 MG/3ML IN NEBU
0.6300 mg | INHALATION_SOLUTION | Freq: Four times a day (QID) | RESPIRATORY_TRACT | Status: DC
  Administered 2012-09-26 – 2012-09-27 (×6): 0.63 mg via RESPIRATORY_TRACT
  Filled 2012-09-26 (×12): qty 3

## 2012-09-26 NOTE — Progress Notes (Addendum)
[facility] PROGRESS NOTE  [name] WUJ:[id number] DOB: 11-Dec-1975 DOA: 09/25/2012 PCP: Pcp Not In System  36 yo F with hx of DVT/PE previously on anticoagulation which was discontinued p/w acute onset of SOB while walking around house.  Symptoms and D-dimer suggestive of PE but too large to get CT to confirm diagnosis.  Empiric anticoagulation was started with heparin and she was converted to lovenox today to start teaching.  She reports she previously underwent a hypercoagulable work up so will defer at this juncture. Pulm was consulted at time of admission and recommended outpatient CT in scanner large enough for patient to confirm diagnosis.  Also increased wheeze today.  Consider steroids and lasix tomorrow if not improving.  Needs A/C follow up and CT scheduled.      Assessment/Plan:  #1. Shortness of breath palpitation and previous history of PE and DVT and elevated D-dimer.   -  Troponins neg x 3 and BNP low -  Patient getting teaching on lovenox and coumadin -  Will need outpatient follow up for CTa to rule out PE at institution with larger CT scan.  -  Appreciate pulmonology recommendations #2. History of asthma with possible bronchitis, actively wheezing -  Continue azithromycin  -  Nebs and budesonide -  Consider steroids and lasix tomorrow if not improving  #3. Hypertension - normal to mildly elevated BP.  continue home medications.  #4. History of OSA - noncompliant with CPAP, continue as inpatient #5. Chronic iron deficiency anemia - hemoglobin appears at baseline. #6.  Tachycardia on telemetry, junctional rhythm on initial ECG but repeat was SR.  Likely sinus tachycardia from PE.   #7.  Dysuria this evening:  Repeat UA.  DIET:  Healthy heart ACCESS: PIV IVF:  OFF PROPH:  lov/coum  Code Status: full code Family Communication: with patient  Disposition Plan: home tomorrow   Consultants:  Pulmonary   Procedures:  None  Antibiotics:  Azithromycin  (indicate start date, and stop date if known)  HPI/Subjective:  States that she became acutely Pesach Frisch of breath while walking in her house, similar to her previous PE.  She denies fevers, chills.  She has some wheezing and congestion.  Denies nausea, vomiting, diarrhea.  Eating oka and ambulating.    Objective: Filed Vitals:   09/26/12 0148 09/26/12 0523 09/26/12 1023 09/26/12 1330  BP:  123/62 139/90 137/99  Pulse: 100 73  104  Temp:  98.2 F (36.8 C)  98.1 F (36.7 C)  TempSrc:  Oral  Oral  Resp: 18 18  18   Height:      Weight:      SpO2: 99% 99%  97%    Intake/Output Summary (Last 24 hours) at 09/26/12 1852 Last data filed at 09/26/12 1830  Gross per 24 hour  Intake    807 ml  Output    800 ml  Net      7 ml   Filed Weights   09/25/12 2100 09/26/12 0007  Weight: 210.469 kg (464 lb) 216.411 kg (477 lb 1.6 oz)    Exam:   General:  [name], no acute distress  HEENT:  MMM  Cardiovascular: Tachycardic, regular rhythm, no mrg  Respiratory: Full expiratory wheeze with mildly diminished BS at the bases  Abdomen: NABS, soft, obese, nontender  MSK:  Trace LEE bilaterally  Data Reviewed: Basic Metabolic Panel:  Lab 09/26/12 9562 09/25/12 2025  NA 137 139  K 3.9 3.7  CL 100 103  CO2 26 --  GLUCOSE 146* 123*  BUN 13 15  CREATININE 0.98 1.00  CALCIUM 8.9 --  MG -- --  PHOS -- --   Liver Function Tests: No results found for this basename: AST:5,ALT:5,ALKPHOS:5,BILITOT:5,PROT:5,ALBUMIN:5 in the last 168 hours No results found for this basename: LIPASE:5,AMYLASE:5 in the last 168 hours No results found for this basename: AMMONIA:5 in the last 168 hours CBC:  Lab 09/26/12 0649 09/25/12 2025 09/25/12 2022  WBC 8.5 -- 9.9  NEUTROABS 4.3 -- 6.7  HGB 8.7* 10.2* 8.5*  HCT 29.9* 30.0* 29.1*  MCV 56.7* -- 56.6*  PLT 607* -- 589*   Cardiac Enzymes:  Lab 09/26/12 1125 09/26/12 0649 09/26/12 0027  CKTOTAL -- -- --  CKMB -- -- --  CKMBINDEX -- -- --    TROPONINI <0.30 <0.30 <0.30   BNP (last 3 results)  Basename 09/26/12 0027  PROBNP 17.8   CBG: No results found for this basename: GLUCAP:5 in the last 168 hours  No results found for this or any previous visit (from the past 240 hour(s)).   Studies: Dg Chest 2 View  09/25/2012  *RADIOLOGY REPORT*  Clinical Data: Shortness of breath.  Hypertension.  CHEST - 2 VIEW  Comparison: Chest x-ray 10/19/2006.  Findings: Lung volumes are normal.  No consolidative airspace disease.  No pleural effusions.  No pneumothorax.  No pulmonary nodule or mass noted.  Pulmonary vasculature and the cardiomediastinal silhouette are within normal limits.  IMPRESSION: 1. No radiographic evidence of acute cardiopulmonary disease.   Original Report Authenticated By: Trudie Reed, M.D.     Scheduled Meds:   . azithromycin  500 mg Intravenous Q24H  . [COMPLETED] azithromycin  500 mg Intravenous Once  . budesonide  0.5 mg Nebulization BID  . enoxaparin (LOVENOX) injection  215 mg Subcutaneous Q12H  . enoxaparin   Does not apply Once  . [COMPLETED] heparin  2,500 Units Intravenous Once  . [COMPLETED] heparin  6,000 Units Intravenous Once  . levalbuterol  0.63 mg Nebulization Q6H  . loratadine  10 mg Oral Daily  . losartan  100 mg Oral Daily  . sodium chloride  3 mL Intravenous Q12H  . sodium chloride  3 mL Intravenous Q12H  . [DISCONTINUED] sodium chloride  500 mL Intravenous Once   Continuous Infusions:   . [DISCONTINUED] heparin 2,350 Units/hr (09/26/12 1506)    Principal Problem:  *SOB (shortness of breath) Active Problems:  Morbid obesity  ASTHMA  Anemia  Pulmonary embolism    Time spent: 30    [name], [facility]  [facility] Pager [phone]. If 8PM-8AM, please contact night-coverage at [email or web address], password [facility] 09/26/2012, 6:52 PM  LOS: 1 day

## 2012-09-26 NOTE — Progress Notes (Signed)
Placed pt on CPAP via full face mask, auto titrate settings (min 6.0-max 18.0) Pt. Tolerating well at this time. RN aware.

## 2012-09-26 NOTE — Progress Notes (Signed)
Brief Nutrition Note:  Pt answered "unsure" to "Have you recently lost weight without trying?" generating a MST (Malnutrition Screening Tool) score of 2.   Weight hx:  Wt Readings from Last 5 Encounters:  09/26/12 477 lb 1.6 oz (216.411 kg)  03/10/10 478 lb (216.819 kg)  12/09/09 476 lb (215.912 kg)  08/14/09 481 lb 9.6 oz (218.452 kg)  06/16/09 450 lb (204.119 kg)    Current diet is Heart Healthy, pt consumed 100% of last meal.   Chart reviewed, no nutrition interventions warranted at this time, please consult as needed.   Clarene Duke RD, LDN Pager [phone] After Hours pager [phone]

## 2012-09-26 NOTE — Progress Notes (Addendum)
ANTICOAGULATION CONSULT NOTE - Follow-Up Consult  Pharmacy Consult for Heparin to Lovenox / Coumadin Indication: pulmonary embolus  Allergies  Allergen Reactions  . Fish Allergy Hives and Shortness Of Breath    Gets dizzy  . Amoxicillin     REACTION: hives  . Other     Allergic to nuts. Causes mouth to itch, breaks out in hives, trouble breathing  . Strawberry     Makes mouth itch   Labs:  Basename 09/26/12 1329 09/26/12 1125 09/26/12 0649 09/26/12 0027 09/25/12 2025 09/25/12 2022  HGB -- -- 8.7* -- 10.2* --  HCT -- -- 29.9* -- 30.0* 29.1*  PLT -- -- 607* -- -- 589*  APTT -- -- -- -- -- --  LABPROT -- -- -- -- -- --  INR -- -- -- -- -- --  HEPARINUNFRC 0.27* -- 0.18* -- -- --  CREATININE -- -- 0.98 -- 1.00 --  CKTOTAL -- -- -- -- -- --  CKMB -- -- -- -- -- --  TROPONINI -- <0.30 <0.30 <0.30 -- --    Estimated Creatinine Clearance: 155 ml/min (by C-G formula based on Cr of 0.98).    Assessment: Pt on heparin for PE.  Noted pulmonologist recommends coumadin for anticoagulation and o/p CT in location that can accomodate her size.  Transitioning to Lovenox / Coumadin  Goal of Therapy:  Appropriate Lovenox dosing Monitor platelets by anticoagulation protocol: Yes   Plan:  1) Lovenox 215 mg sq Q 12 hours 2) Follow up plans for Coumadin 3) Monitor CBC 4) Watch for signs and symptoms of bleeding 5) Beginning Coumadin tonight - 10 mg x 1 6) Follow up AM INR  Thank you. Okey Regal, PharmD [phone] 09/26/2012   4:33 PM

## 2012-09-26 NOTE — Progress Notes (Signed)
ANTICOAGULATION CONSULT NOTE - Initial Consult  Pharmacy Consult for Heparin Indication: pulmonary embolus  Allergies  Allergen Reactions  . Fish Allergy Hives and Shortness Of Breath    Gets dizzy  . Amoxicillin     REACTION: hives  . Other     Allergic to nuts. Causes mouth to itch, breaks out in hives, trouble breathing  . Strawberry     Makes mouth itch    Patient Measurements: Height: 5\' 8"  (172.7 cm) Weight: 477 lb 1.6 oz (216.411 kg) IBW/kg (Calculated) : 63.9  Heparin Dosing Weight: 124 kg  Vital Signs: Temp: 98.2 F (36.8 C) (11/26 0523) Temp src: Oral (11/26 0523) BP: 123/62 mmHg (11/26 0523) Pulse Rate: 73  (11/26 0523)  Labs:  Alvira Philips 09/26/12 0649 09/26/12 0027 09/25/12 2025 09/25/12 2022  HGB -- -- 10.2* 8.5*  HCT -- -- 30.0* 29.1*  PLT -- -- -- 589*  APTT -- -- -- --  LABPROT -- -- -- --  INR -- -- -- --  HEPARINUNFRC 0.18* -- -- --  CREATININE -- -- 1.00 --  CKTOTAL -- -- -- --  CKMB -- -- -- --  TROPONINI -- <0.30 -- --    Estimated Creatinine Clearance: 151.9 ml/min (by C-G formula based on Cr of 1).   Medical History: Past Medical History  Diagnosis Date  . Asthma   . OSA (obstructive sleep apnea)   . History of blood clots   . Morbid obesity    Assessment:  subtherapeutic HL at 0.18. No bleeding noted.   Goal of Therapy:  Heparin level 0.3-0.7 units/ml Monitor platelets by anticoagulation protocol: Yes   Plan:   heparin 2500 units bolus then increase basal rate to 2100 units/hr   Janice Coffin, RPh

## 2012-09-26 NOTE — Progress Notes (Signed)
ANTICOAGULATION CONSULT NOTE - Follow-Up Consult  Pharmacy Consult for Heparin Indication: pulmonary embolus  Allergies  Allergen Reactions  . Fish Allergy Hives and Shortness Of Breath    Gets dizzy  . Amoxicillin     REACTION: hives  . Other     Allergic to nuts. Causes mouth to itch, breaks out in hives, trouble breathing  . Strawberry     Makes mouth itch    Patient Measurements: Height: 5\' 8"  (172.7 cm) Weight: 477 lb 1.6 oz (216.411 kg) IBW/kg (Calculated) : 63.9  Heparin Dosing Weight: 124 kg  Vital Signs: Temp: 98.1 F (36.7 C) (11/26 1330) Temp src: Oral (11/26 1330) BP: 137/99 mmHg (11/26 1330) Pulse Rate: 104  (11/26 1330)  Labs:  Basename 09/26/12 1329 09/26/12 1125 09/26/12 0649 09/26/12 0027 09/25/12 2025 09/25/12 2022  HGB -- -- 8.7* -- 10.2* --  HCT -- -- 29.9* -- 30.0* 29.1*  PLT -- -- 607* -- -- 589*  APTT -- -- -- -- -- --  LABPROT -- -- -- -- -- --  INR -- -- -- -- -- --  HEPARINUNFRC 0.27* -- 0.18* -- -- --  CREATININE -- -- 0.98 -- 1.00 --  CKTOTAL -- -- -- -- -- --  CKMB -- -- -- -- -- --  TROPONINI -- <0.30 <0.30 <0.30 -- --    Estimated Creatinine Clearance: 155 ml/min (by C-G formula based on Cr of 0.98).    Assessment: Pt on heparin for PE. Heparin level remains slightly subtherapeutic at 0.27 after gtt increase. Hgb low but no bleeding noted. Will follow. Noted pulmonologist recommends coumadin for anticoagulation and o/p CT in location that can accomodate her size.  Goal of Therapy:  Heparin level 0.3-0.7 units/ml Monitor platelets by anticoagulation protocol: Yes   Plan:  1) Increase heparin gtt to 2350 units/hr  2) F/u 6 hr level 3) F/u coumadin start   Christoper Fabian, PharmD, BCPS Clinical pharmacist, pager [phone] 09/26/2012   2:29 PM

## 2012-09-26 NOTE — Progress Notes (Signed)
Utilization Review Completed.Iran Kievit T11/26/2013   

## 2012-09-26 NOTE — Consult Note (Addendum)
PULMONARY  / CRITICAL CARE MEDICINE  Name: [name] MRN: [id number] DOB: 10-17-1976    LOS: 1  REFERRING PROVIDER:  Dr. Toniann Fail  CHIEF COMPLAINT:  SOB, Left Calf Pain  BRIEF PATIENT DESCRIPTION: 36 year old morbidly obese female with history of PE in 2008 who presents to the hospital with 2 day history of SOB and left calf pain. Patient was sent to the ED from urgent care with persistent SOB and was started on heparin. PCCM was called to comment on anticoagulating the patient empirically vs d/c of drugs.  Patient has evidently had a PE diagnosed by CT in 2007 as well and finished an anticoagulation course.  LINES / TUBES: PIV  CULTURES: None  ANTIBIOTICS: 11/26 Azithro>>>  SIGNIFICANT EVENTS:  11/25 - Admit with SOB, Calf pain, empirically treated for PE / DVT 11/25 - BLE Venous Doppler>>>No obvious evidence of deep vein or superficial thrombosis involving the right lower extremity and left lower extremity. Incidental findings are consistent with: Baker's Cyst on the left.   LEVEL OF CARE:  Floor PRIMARY SERVICE:  TRH CONSULTANTS:  PCCM CODE STATUS:  Full DIET:  PO DVT Px:  Full Dose Heparin GI Px:  None indicated   HISTORY OF PRESENT ILLNESS:  36 year old morbidly obese female with history of PE in 2008 who presents to the hospital with 2 day history of SOB, cough with white sputum production, palpitations, use of OTC meds without relief of symptoms, and left calf pain. She reports that she injured her back 6 months ago and has been less mobile since.  No recent travel.  Patient was sent to the ED from urgent care with persistent SOB and was started on heparin. LE Venous doppler studies were obtained and negative for DVT, mildly elevated D-Dimer.  CXR clear without infiltrate.  CTA of chest was unable to be performed due to body habitus.  PCCM was called to comment on anticoagulating the patient empirically vs d/c of drugs.   PAST MEDICAL HISTORY :  Past Medical History   Diagnosis Date  . Asthma   . OSA (obstructive sleep apnea)   . History of blood clots   . Morbid obesity    Past Surgical History  Procedure Date  . Cholecystectomy   . Tubal ligation   . Cesarean section    Prior to Admission medications   Medication Sig Start Date End Date Taking? Authorizing Provider  albuterol (PROVENTIL HFA;VENTOLIN HFA) 108 (90 BASE) MCG/ACT inhaler Inhale 2 puffs into the lungs 2 (two) times daily as needed. For shortness of breath   Yes Historical Provider, MD  azithromycin (ZITHROMAX Z-PAK) 250 MG tablet Take as directed 09/25/12  Yes Clanford Cyndie Mull, MD  cetirizine (ZYRTEC) 10 MG tablet Take 10 mg by mouth daily as needed. For allergies   Yes Historical Provider, MD  Fluticasone-Salmeterol (ADVAIR) 250-50 MCG/DOSE AEPB Inhale 1 puff into the lungs every 12 (twelve) hours. 09/25/12  Yes Clanford Cyndie Mull, MD  HYDROcodone-acetaminophen (VICODIN) 5-500 MG per tablet Take 1 tablet by mouth every 12 (twelve) hours as needed for pain. For pain 09/25/12  Yes Clanford Cyndie Mull, MD  ibuprofen (ADVIL,MOTRIN) 200 MG tablet Take 600 mg by mouth every 6 (six) hours as needed. For back pain/inflammation   Yes Historical Provider, MD  losartan (COZAAR) 100 MG tablet Take 1 tablet (100 mg total) by mouth daily. 09/25/12  Yes Clanford Cyndie Mull, MD  Pseudoeph-Doxylamine-DM-APAP (NYQUIL PO) Take 1 capsule by mouth at bedtime as needed.  For cold symptoms   Yes Historical Provider, MD  traMADol (ULTRAM) 50 MG tablet Take 1 tablet (50 mg total) by mouth every 8 (eight) hours as needed for pain. For pain 09/25/12  Yes Clanford Cyndie Mull, MD   Allergies  Allergen Reactions  . Fish Allergy Hives and Shortness Of Breath    Gets dizzy  . Amoxicillin     REACTION: hives  . Other     Allergic to nuts. Causes mouth to itch, breaks out in hives, trouble breathing  . Strawberry     Makes mouth itch    FAMILY HISTORY:  Family History  Problem Relation Age of Onset  . Asthma  Mother   . Leukemia Father    SOCIAL HISTORY:  reports that she has never smoked. She does not have any smokeless tobacco history on file. She reports that she does not drink alcohol or use illicit drugs.  REVIEW OF SYSTEMS:   See HPI for pertinent positives.  All others systems reviewed and are negative.    INTERVAL HISTORY:  In bed, no acute distress.   VITAL SIGNS: Temp:  [98.2 F (36.8 C)-99.2 F (37.3 C)] 98.2 F (36.8 C) (11/26 0523) Pulse Rate:  [73-128] 73  (11/26 0523) Resp:  [13-22] 18  (11/26 0523) BP: (119-153)/(62-103) 139/90 mmHg (11/26 1023) SpO2:  [97 %-100 %] 99 % (11/26 0523) Weight:  [464 lb (210.469 kg)-477 lb 1.6 oz (216.411 kg)] 477 lb 1.6 oz (216.411 kg) (11/26 0007)  PHYSICAL EXAMINATION: General:  Morbidly obese in NAD Neuro:  AAOx4, speech clear HEENT:  Mm pink/moist Cardiovascular:  s1s2 rrr, no m/r/g Lungs:  resp's even/non-labored, lungs bilaterally diminished Abdomen:  Obese/soft, bsx4 active Musculoskeletal:  No acute deformities Skin:  Warm/dry, no edema   Lab 09/26/12 0649 09/25/12 2025  NA 137 139  K 3.9 3.7  CL 100 103  CO2 26 --  BUN 13 15  CREATININE 0.98 1.00  GLUCOSE 146* 123*    Lab 09/26/12 0649 09/25/12 2025 09/25/12 2022  HGB 8.7* 10.2* 8.5*  HCT 29.9* 30.0* 29.1*  WBC 8.5 -- 9.9  PLT 607* -- 589*   Dg Chest 2 View  09/25/2012  *RADIOLOGY REPORT*  Clinical Data: Shortness of breath.  Hypertension.  CHEST - 2 VIEW  Comparison: Chest x-ray 10/19/2006.  Findings: Lung volumes are normal.  No consolidative airspace disease.  No pleural effusions.  No pneumothorax.  No pulmonary nodule or mass noted.  Pulmonary vasculature and the cardiomediastinal silhouette are within normal limits.  IMPRESSION: 1. No radiographic evidence of acute cardiopulmonary disease.   Original Report Authenticated By: Trudie Reed, M.D.    11/26 EKG>>>sinus rhythm   ASSESSMENT / PLAN:  Dyspnea  Calf Pain  Assessment:  Known history of PE in  2008.  Presents with calf pain, dyspnea in setting of morbid obesity.  Unable to perform CTA due to habitus.  Mild elevation of D-Dimer & negative LE DVT.  Concern with history of DVT for reoccurrence.   Plan: -continue full dose anti-coagulation -would recommend coumadin as oral agent given patients size (unclear effectiveness of xarelto etc in morbidly obese) -would recommend outpatient CT that can accommodate her size post discharge and re-eval need for anti-coagulation.      Canary Brim, NP-C Pulmonary and Critical Care Medicine [facility] Pager: [phone]  09/26/2012, 10:27 AM   Recommend full anticoagulation with coumadin as above given size.  Would also recommend outpatient CT in a location that can accommodate size soon since  if negative we would not have to fully anticoagulate for life.  Patient seen and examined, agree with above note.  I dictated the care and orders written for this patient under my direction.  Koren Bound, M.D. [phone]

## 2012-09-27 DIAGNOSIS — D649 Anemia, unspecified: Secondary | ICD-10-CM

## 2012-09-27 DIAGNOSIS — D509 Iron deficiency anemia, unspecified: Secondary | ICD-10-CM

## 2012-09-27 DIAGNOSIS — J309 Allergic rhinitis, unspecified: Secondary | ICD-10-CM

## 2012-09-27 LAB — URINALYSIS, ROUTINE W REFLEX MICROSCOPIC
Bilirubin Urine: NEGATIVE
Glucose, UA: NEGATIVE mg/dL
Hgb urine dipstick: NEGATIVE
Ketones, ur: NEGATIVE mg/dL
Protein, ur: NEGATIVE mg/dL

## 2012-09-27 LAB — URINE MICROSCOPIC-ADD ON

## 2012-09-27 LAB — CBC
HCT: 27.8 % — ABNORMAL LOW (ref 36.0–46.0)
MCH: 16.7 pg — ABNORMAL LOW (ref 26.0–34.0)
MCV: 57.3 fL — ABNORMAL LOW (ref 78.0–100.0)
Platelets: 507 10*3/uL — ABNORMAL HIGH (ref 150–400)
RBC: 4.85 MIL/uL (ref 3.87–5.11)

## 2012-09-27 LAB — BASIC METABOLIC PANEL
BUN: 14 mg/dL (ref 6–23)
CO2: 26 mEq/L (ref 19–32)
Calcium: 8.6 mg/dL (ref 8.4–10.5)
Creatinine, Ser: 0.86 mg/dL (ref 0.50–1.10)

## 2012-09-27 MED ORDER — RIVAROXABAN 15 MG PO TABS
15.0000 mg | ORAL_TABLET | Freq: Two times a day (BID) | ORAL | Status: DC
  Administered 2012-09-27 – 2012-09-28 (×2): 15 mg via ORAL
  Filled 2012-09-27 (×4): qty 1

## 2012-09-27 MED ORDER — WARFARIN SODIUM 10 MG PO TABS
10.0000 mg | ORAL_TABLET | Freq: Once | ORAL | Status: DC
  Filled 2012-09-27: qty 1

## 2012-09-27 MED ORDER — WARFARIN VIDEO: Freq: Once | Status: DC

## 2012-09-27 MED ORDER — COUMADIN BOOK
Freq: Once | Status: DC
  Filled 2012-09-27: qty 1

## 2012-09-27 NOTE — Progress Notes (Signed)
ANTICOAGULATION CONSULT NOTE - Follow Up Consult  Pharmacy Consult for Xarelto Indication: pulmonary embolus  Allergies  Allergen Reactions  . Fish Allergy Hives and Shortness Of Breath    Gets dizzy  . Amoxicillin     REACTION: hives  . Other     Allergic to nuts. Causes mouth to itch, breaks out in hives, trouble breathing  . Strawberry     Makes mouth itch    Patient Measurements: Height: 5\' 8"  (172.7 cm) Weight: 477 lb 1.6 oz (216.411 kg) IBW/kg (Calculated) : 63.9   Vital Signs: Temp: 98.1 F (36.7 C) (11/27 0334) Temp src: Oral (11/27 0334) BP: 124/80 mmHg (11/27 0334) Pulse Rate: 100  (11/27 0334)  Labs:  Basename 09/27/12 0500 09/26/12 1329 09/26/12 1125 09/26/12 0649 09/26/12 0027 09/25/12 2025 09/25/12 2022  HGB 8.1* -- -- 8.7* -- -- --  HCT 27.8* -- -- 29.9* -- 30.0* --  PLT 507* -- -- 607* -- -- 589*  APTT -- -- -- -- -- -- --  LABPROT 13.5 -- -- -- -- -- --  INR 1.04 -- -- -- -- -- --  HEPARINUNFRC -- 0.27* -- 0.18* -- -- --  CREATININE 0.86 -- -- 0.98 -- 1.00 --  CKTOTAL -- -- -- -- -- -- --  CKMB -- -- -- -- -- -- --  TROPONINI -- -- <0.30 <0.30 <0.30 -- --    Estimated Creatinine Clearance: 176.6 ml/min (by C-G formula based on Cr of 0.86).  Assessment:   To transition from Lovenox and Coumadin to Xarelto due to difficulty arranging outpatient Coumadin follow-up as well as high-dose Lovenox needs due to body size.  Discussed with Dr. Cena Benton and patient. Last Lovenox dose was ~5am today.  INR 1.04 after Coumadin 10 mg x 1 on 11/26.  Xarelto to begin when next Lovenox dose would be due (5pm today). Her Pharmacy [facility] [address]) has Xarelto in stock, and will be open 9am-3pm on 11/28 (Thanksgiving Day).  She is familiar with anticoagulant precautions from prior Coumadin use.  Goal of Therapy:  appropriate Xarelto dosing for indication and renal function Monitor platelets by anticoagulation protocol: Yes   Plan:   Xarelto 15 mg BID x 3  weeks to begin tonight;  Then Xarelto 20 mg daily with supper.  Marya Landry Pager: [phone] 09/27/2012,3:28 PM

## 2012-09-27 NOTE — ED Provider Notes (Signed)
Medical screening examination/treatment/procedure(s) were conducted as a shared visit with non-physician practitioner(s) and myself.  I personally evaluated the patient during the encounter  [name]. Michelle Payor, MD 09/27/12 [phone]

## 2012-09-27 NOTE — Care Management Note (Addendum)
    Page 1 of 2   09/29/2012     1:54:43 PM   CARE MANAGEMENT NOTE 09/29/2012  Patient:  [name], [name]   Account Number:  [id number]  Date Initiated:  09/27/2012  Documentation initiated by:  Alvira Philips Assessment:   36 yr-old female adm with DVT/PE     Action/Plan:   Anticipated DC Date:  09/30/2012   Anticipated DC Plan:  HOME/SELF CARE      DC Planning Services  CM consult      Choice offered to / List presented to:             Status of service:  Completed, signed off Medicare Important Message given?   (If response is "NO", the following Medicare IM given date fields will be blank) Date Medicare IM given:   Date Additional Medicare IM given:    Discharge Disposition:    Per UR Regulation:  Reviewed for med. necessity/level of care/duration of stay  If discussed at Long Length of Stay Meetings, dates discussed:    Comments:  09/29/12 Tyhir Schwan,RN,BSN [phone] PT TOO LARGE FOR SCANNER AT [place], AND NEEDS CTA TO R/O PULM EMBOLUS.  PLAN TO DC HOME ON XARELTO, WITH OP SCAN AT [facility], [facility] LOCATION, AS THEY SCAN UP TO 500 LBS.  THIS IMAGING CENTER IS CLOSED TODAY, SO UNABLE TO MAKE APPT.  NOTIFIED DR Cena Benton OF THIS INFORMATION.   09/27/12 1330 Henrietta Mayo RN BSN MSN CCM Pt is to d/c on Lovenox.  Per pt, her assigned primary care provider was [facility] and she was seen @ [facility] P[facility].  TC to [facility] - per Arna Medici, they will serve as primary care provider for pt but will not do INR checks -  refers pt to [facility].  Per [facility] Clinic, they only provide that service for their own pts and will not do INR check on any other pt.  TC to [facility] - they would do the INR checks but do not accept M[facility].  TC to [facility] - they also follow INRs but are not accepting MCD pts.  TC to [facility] - they are also not accepting new MCD pts.  Discussed with attending.    Xarelto is on M[facility] preferred drug list, so pt to be started on Xarelto. Pt has CPAP machine through [facility] but has not used same since 2009 - states she needs tubing for machine.   TC to [facility] - [facility] will not reimburse for CPAP supplies unless pt has had sleep study within the year so new sleep study will be required.  Discussed with pt and [facility].

## 2012-09-27 NOTE — Progress Notes (Signed)
Pt.'s CPAP is set up at bedside on auto titrate (Min: 5, Max: 16) with FFM. Pt. Stated that she would place herself on CPAP when ready for bed. RT showed pt. How to turn CPAP on & off & made sure pt. Was comfortable with CPAP settings. Pt. Was made aware to call RT if she needed assistance.

## 2012-09-27 NOTE — Progress Notes (Signed)
PULMONARY  / CRITICAL CARE MEDICINE  Name: [name] MRN: [id number] DOB: 05-21-1976    LOS: 2  REFERRING PROVIDER:  Dr. Toniann [name]  CHIEF COMPLAINT:  SOB, Left Calf Pain  BRIEF PATIENT DESCRIPTION: 36 year old morbidly obese female with history of PE in 2008 who presents to the hospital with 2 day history of SOB and left calf pain. Patient was sent to the ED from urgent care with persistent SOB and was started on heparin. PCCM was called to comment on anticoagulating the patient empirically vs d/c of drugs.  Patient has evidently had a PE diagnosed by CT in 2007 as well and finished an anticoagulation course.  LINES / TUBES: PIV  CULTURES: None  ANTIBIOTICS: 11/26 Azithro>>>  SIGNIFICANT EVENTS:  11/25 - Admit with SOB, Calf pain, empirically treated for PE / DVT 11/25 - BLE Venous Doppler>>>No obvious evidence of deep vein or superficial thrombosis involving the right lower extremity and left lower extremity. Incidental findings are consistent with: Baker's Cyst on the left.   INTERVAL HISTORY:  OOB in chair.  Feels great after wearing CPAP overnight.    VITAL SIGNS: Temp:  [98.1 F (36.7 C)-98.6 F (37 C)] 98.1 F (36.7 C) (11/27 0334) Pulse Rate:  [78-104] 100  (11/27 0334) Resp:  [18-20] 18  (11/27 0334) BP: (124-139)/(80-99) 124/80 mmHg (11/27 0334) SpO2:  [97 %-99 %] 99 % (11/27 0715)  PHYSICAL EXAMINATION: General:  Morbidly obese in NAD sitting OOB in chair  Neuro:  AAOx4, speech clear HEENT:  Mm pink/moist Cardiovascular:  s1s2 rrr, no m/r/g Lungs:  resp's even/non-labored, lungs bilaterally diminished Abdomen:  Obese/soft, bsx4 active Musculoskeletal:  No acute deformities Skin:  Warm/dry, no edema   Lab 09/27/12 0500 09/26/12 0649 09/25/12 2025  NA 137 137 139  K 3.7 3.9 3.7  CL 103 100 103  CO2 26 26 --  BUN 14 13 15   CREATININE 0.86 0.98 1.00  GLUCOSE 139* 146* 123*    Lab 09/27/12 0500 09/26/12 0649 09/25/12 2025 09/25/12 2022  HGB 8.1*  8.7* 10.2* --  HCT 27.8* 29.9* 30.0* --  WBC 9.0 8.5 -- 9.9  PLT 507* 607* -- 589*   Dg Chest 2 View  09/25/2012  *RADIOLOGY REPORT*  Clinical Data: Shortness of breath.  Hypertension.  CHEST - 2 VIEW  Comparison: Chest x-ray 10/19/2006.  Findings: Lung volumes are normal.  No consolidative airspace disease.  No pleural effusions.  No pneumothorax.  No pulmonary nodule or mass noted.  Pulmonary vasculature and the cardiomediastinal silhouette are within normal limits.  IMPRESSION: 1. No radiographic evidence of acute cardiopulmonary disease.   Original Report Authenticated By: Trudie Reed, M.D.    11/26 EKG>>>sinus rhythm   ASSESSMENT / PLAN:  Dyspnea  Calf Pain OSA/OHS  Assessment:  Known history of PE in 2008.  Presents with calf pain, dyspnea in setting of morbid obesity.  Unable to perform CTA due to habitus.  Mild elevation of D-Dimer & negative LE DVT.  Concern with history of DVT for reoccurrence.  BLE dopplers negative.  Likely some degree decompensated OSA/OHS  Plan: -continue full dose anti-coagulation -would recommend coumadin as oral agent given patients size (unclear effectiveness of xarelto etc in morbidly obese) -would recommend  VQ scan inpt ( I ordered)vs outpatient CT that can accommodate her size post discharge and re-eval need for anti-coagulation.   -outpt pulm/sleep f/u, may need repeat study (previously done in 2008) -encouraged to be complaint with CPAP  -will need Case management to  arrange CPAP supplies on d/c ( she has machine at home  But no tubingPatsy [name])   PCCM signing off please call back if needed   [employer], NP 09/27/2012  10:04 AM Pager: [phone] or [phone]  *Care during the described time interval was provided by me and/or other providers on the critical care team. I have reviewed this patient's available data, including medical history, events of note, physical examination and test results as part of my  evaluation.  [name] V.  [phone]

## 2012-09-27 NOTE — Progress Notes (Addendum)
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  Symptoms and D-dimer suggestive of PE but too large to get CT to confirm diagnosis.  Empiric anticoagulation was started with heparin and she was converted to lovenox today to start teaching.  She reports she previously underwent a hypercoagulable work up so will defer at this juncture. Pulm was consulted at time of admission and recommended outpatient CT in scanner large enough for patient to confirm diagnosis.  Also increased wheeze today.  Consider steroids and lasix tomorrow if not improving.  Needs A/C follow up and CT scheduled.      Assessment/Plan:  #1. Shortness of breath palpitation and previous history of PE and DVT and elevated D-dimer.   -  Troponins neg x 3 and BNP low -  Will need outpatient follow up for CTa to rule out PE at institution with larger CT scan.  -  Appreciate pulmonology recommendations -  Patient started on coumadin but after discussion with care manager there are no facilities that are addendum: accepting medicaid patient for INR checks.  As such patient will not be able to get her INR checked as outpatient with places patient at risk for being on a blood thinner which may be under therapeutic or over therapeutic.   Given the above situation we will place patient on xarelto.  If patient would need lovenox she would have to take several times per day for its effectiveness and from a compliance standpoint would prove harder for the patient to maintain this regimen.  As such we will try patient on xarelto at this juncture.  Patient is aware of options and agreeable to try the xarelto.  #2. History of asthma with possible bronchitis, actively wheezing -  Continue azithromycin  -  Nebs and budesonide - improved on this  regimen.  #3. Hypertension - Continue home medications, currently well controlled with blood pressure of 124/80 #4. History of OSA - noncompliant with CPAP, continue as inpatient and have discussed with care manager to help set up referral for outpatient sleep study #5. Chronic iron deficiency anemia - hemoglobin appears at baseline. #6.  Tachycardia: resolving has had heart rate ranged from 78-104 #7.  Dysuria this evening:  Addendum patient with no complaints of dysuria today. WBC within normal limits and patient afebrile.  At this juncture will continue to monitor and will defer antibiotics given the things mentioned above.  DIET:  Healthy heart ACCESS: PIV IVF:  OFF PROPH:  xarelto  Code Status: full code Family Communication: with patient and family at bedside Disposition Plan: home likely in 1-2 days   Consultants:  Pulmonary   Procedures:  None  Antibiotics:  Azithromycin   Rocephin  HPI/Subjective: No new complaints today.  No acute issues overnight. At this point is aware that finding a facility to monitor her INR's have not been possible based on conversation with care manager.  She is willing to try xarelto at this juncture.  Had no new questions after our discussion.  Objective: Filed Vitals:   09/26/12 2304 09/27/12 0209 09/27/12 0334 09/27/12 0715  BP:   124/80   Pulse: 78 82 100   Temp:   98.1 F (36.7 C)   TempSrc:   Oral   Resp: 18 18 18    Height:  Weight:      SpO2: 98% 99% 98% 99%    Intake/Output Summary (Last 24 hours) at 09/27/12 1459 Last data filed at 09/27/12 0730  Gross per 24 hour  Intake    687 ml  Output   1100 ml  Net   -413 ml   Filed Weights   09/25/12 2100 09/26/12 0007  Weight: 210.469 kg (464 lb) 216.411 kg (477 lb 1.6 oz)    Exam:   General:  [name], no acute distress  HEENT:  MMM  Cardiovascular: regular rhythm, no mrg  Respiratory: BS BL, no rhales, mild exp wheezes diffusely  Abdomen: NABS, soft,  obese, nontender  MSK:  Trace LEE bilaterally  Data Reviewed: Basic Metabolic Panel:  Lab 09/27/12 9528 09/26/12 0649 09/25/12 2025  NA 137 137 139  K 3.7 3.9 3.7  CL 103 100 103  CO2 26 26 --  GLUCOSE 139* 146* 123*  BUN 14 13 15   CREATININE 0.86 0.98 1.00  CALCIUM 8.6 8.9 --  MG -- -- --  PHOS -- -- --   Liver Function Tests: No results found for this basename: AST:5,ALT:5,ALKPHOS:5,BILITOT:5,PROT:5,ALBUMIN:5 in the last 168 hours No results found for this basename: LIPASE:5,AMYLASE:5 in the last 168 hours No results found for this basename: AMMONIA:5 in the last 168 hours CBC:  Lab 09/27/12 0500 09/26/12 0649 09/25/12 2025 09/25/12 2022  WBC 9.0 8.5 -- 9.9  NEUTROABS -- 4.3 -- 6.7  HGB 8.1* 8.7* 10.2* 8.5*  HCT 27.8* 29.9* 30.0* 29.1*  MCV 57.3* 56.7* -- 56.6*  PLT 507* 607* -- 589*   Cardiac Enzymes:  Lab 09/26/12 1125 09/26/12 0649 09/26/12 0027  CKTOTAL -- -- --  CKMB -- -- --  CKMBINDEX -- -- --  TROPONINI <0.30 <0.30 <0.30   BNP (last 3 results)  Basename 09/26/12 0027  PROBNP 17.8   CBG: No results found for this basename: GLUCAP:5 in the last 168 hours  No results found for this or any previous visit (from the past 240 hour(s)).   Studies: Dg Chest 2 View  09/25/2012  *RADIOLOGY REPORT*  Clinical Data: Shortness of breath.  Hypertension.  CHEST - 2 VIEW  Comparison: Chest x-ray 10/19/2006.  Findings: Lung volumes are normal.  No consolidative airspace disease.  No pleural effusions.  No pneumothorax.  No pulmonary nodule or mass noted.  Pulmonary vasculature and the cardiomediastinal silhouette are within normal limits.  IMPRESSION: 1. No radiographic evidence of acute cardiopulmonary disease.   Original Report Authenticated By: Trudie Reed, M.D.     Scheduled Meds:    . azithromycin  500 mg Oral Daily  . budesonide  0.5 mg Nebulization BID  . levalbuterol  0.63 mg Nebulization Q6H  . loratadine  10 mg Oral Daily  . losartan  100 mg Oral  Daily  . rivaroxaban  15 mg Oral BID  . sodium chloride  3 mL Intravenous Q12H  . sodium chloride  3 mL Intravenous Q12H  . [COMPLETED] warfarin  10 mg Oral NOW  . [DISCONTINUED] azithromycin  500 mg Intravenous Q24H  . [DISCONTINUED] coumadin book   Does not apply Once  . [DISCONTINUED] enoxaparin (LOVENOX) injection  215 mg Subcutaneous Q12H  . [DISCONTINUED] enoxaparin   Does not apply Once  . [DISCONTINUED] warfarin  10 mg Oral ONCE-1800  . [DISCONTINUED] warfarin   Does not apply Once  . [DISCONTINUED] Warfarin - Pharmacist Dosing Inpatient   Does not apply q1800   Continuous Infusions:    . [DISCONTINUED] heparin  2,350 Units/hr (09/26/12 1506)    Principal Problem:  *SOB (shortness of breath) Active Problems:  Morbid obesity  ASTHMA  Anemia  Pulmonary embolism    Time spent: 30    Penny Pia  [facility] Pager [phone]. If 8PM-8AM, please contact night-coverage at [email or web address], password [facility] 09/27/2012, 2:59 PM  LOS: 2 days

## 2012-09-27 NOTE — Progress Notes (Signed)
ANTICOAGULATION CONSULT NOTE - Follow Up Consult  Pharmacy Consult for Lovenox/Coumadin Indication: pulmonary embolus  Allergies  Allergen Reactions  . Fish Allergy Hives and Shortness Of Breath    Gets dizzy  . Amoxicillin     REACTION: hives  . Other     Allergic to nuts. Causes mouth to itch, breaks out in hives, trouble breathing  . Strawberry     Makes mouth itch    Patient Measurements: Height: 5\' 8"  (172.7 cm) Weight: 477 lb 1.6 oz (216.411 kg) IBW/kg (Calculated) : 63.9   Vital Signs: Temp: 98.1 F (36.7 C) (11/27 0334) Temp src: Oral (11/27 0334) BP: 124/80 mmHg (11/27 0334) Pulse Rate: 100  (11/27 0334)  Labs:  Basename 09/27/12 0500 09/26/12 1329 09/26/12 1125 09/26/12 0649 09/26/12 0027 09/25/12 2025 09/25/12 2022  HGB 8.1* -- -- 8.7* -- -- --  HCT 27.8* -- -- 29.9* -- 30.0* --  PLT 507* -- -- 607* -- -- 589*  APTT -- -- -- -- -- -- --  LABPROT 13.5 -- -- -- -- -- --  INR 1.04 -- -- -- -- -- --  HEPARINUNFRC -- 0.27* -- 0.18* -- -- --  CREATININE 0.86 -- -- 0.98 -- 1.00 --  CKTOTAL -- -- -- -- -- -- --  CKMB -- -- -- -- -- -- --  TROPONINI -- -- <0.30 <0.30 <0.30 -- --    Estimated Creatinine Clearance: 176.6 ml/min (by C-G formula based on Cr of 0.86).   Medications:  Scheduled:    . azithromycin  500 mg Oral Daily  . budesonide  0.5 mg Nebulization BID  . enoxaparin (LOVENOX) injection  215 mg Subcutaneous Q12H  . enoxaparin   Does not apply Once  . levalbuterol  0.63 mg Nebulization Q6H  . loratadine  10 mg Oral Daily  . losartan  100 mg Oral Daily  . sodium chloride  3 mL Intravenous Q12H  . sodium chloride  3 mL Intravenous Q12H  . [COMPLETED] warfarin  10 mg Oral NOW  . Warfarin - Pharmacist Dosing Inpatient   Does not apply q1800  . [DISCONTINUED] azithromycin  500 mg Intravenous Q24H    Assessment: Pulmonary Embolism: Continuing Lovenox bridging to Coumadin - today is Day #2 of a minimum 5-day overlap.  INR is essentially  unchanged, as would be expected after only 1 dose of Coumadin.  CBC is stable, no bleeding noted.  Goal of Therapy:  INR 2-3 Anti-Xa level 0.6-1.2 units/ml 4hrs after LMWH dose given Monitor platelets by anticoagulation protocol: Yes   Plan:  Coumadin 10mg  today.  Check PT/INR in AM. Continue Lovenox 215mg  SQ q12h.    When she is ready for discharge recommend changing Lovenox to 220mg  SQ q12h (120mg  syringe + 100mg  syringe for each dose) - this will facilitate more accurate dose administration at home.  Estella Husk, Pharm.D., BCPS Clinical Pharmacist  Phone [phone] Pager [phone] 09/27/2012, 10:39 AM

## 2012-09-28 DIAGNOSIS — D573 Sickle-cell trait: Secondary | ICD-10-CM

## 2012-09-28 DIAGNOSIS — E669 Obesity, unspecified: Secondary | ICD-10-CM

## 2012-09-28 LAB — PROTIME-INR
INR: 1.31 (ref 0.00–1.49)
Prothrombin Time: 16 seconds — ABNORMAL HIGH (ref 11.6–15.2)

## 2012-09-28 LAB — CBC
Hemoglobin: 7.5 g/dL — ABNORMAL LOW (ref 12.0–15.0)
Platelets: 502 10*3/uL — ABNORMAL HIGH (ref 150–400)
RBC: 4.76 MIL/uL (ref 3.87–5.11)
WBC: 9.6 10*3/uL (ref 4.0–10.5)

## 2012-09-28 LAB — URINE CULTURE: Colony Count: 65000

## 2012-09-28 LAB — FERRITIN: Ferritin: 10 ng/mL (ref 10–291)

## 2012-09-28 LAB — IRON AND TIBC
Iron: 18 ug/dL — ABNORMAL LOW (ref 42–135)
TIBC: 393 ug/dL (ref 250–470)

## 2012-09-28 LAB — BASIC METABOLIC PANEL
CO2: 27 mEq/L (ref 19–32)
Chloride: 105 mEq/L (ref 96–112)
Glucose, Bld: 114 mg/dL — ABNORMAL HIGH (ref 70–99)
Sodium: 139 mEq/L (ref 135–145)

## 2012-09-28 MED ORDER — LEVALBUTEROL HCL 0.63 MG/3ML IN NEBU
0.6300 mg | INHALATION_SOLUTION | Freq: Two times a day (BID) | RESPIRATORY_TRACT | Status: DC
  Administered 2012-09-28 – 2012-09-30 (×4): 0.63 mg via RESPIRATORY_TRACT
  Filled 2012-09-28 (×6): qty 3

## 2012-09-28 MED ORDER — WARFARIN SODIUM 10 MG PO TABS
10.0000 mg | ORAL_TABLET | Freq: Once | ORAL | Status: DC
  Filled 2012-09-28: qty 1

## 2012-09-28 MED ORDER — ENOXAPARIN SODIUM 150 MG/ML ~~LOC~~ SOLN
215.0000 mg | Freq: Two times a day (BID) | SUBCUTANEOUS | Status: DC
  Administered 2012-09-28: 215 mg via SUBCUTANEOUS
  Filled 2012-09-28 (×4): qty 2

## 2012-09-28 MED ORDER — SODIUM CHLORIDE 0.9 % IV SOLN
1020.0000 mg | Freq: Once | INTRAVENOUS | Status: AC
  Administered 2012-09-29: 1020 mg via INTRAVENOUS
  Filled 2012-09-28 (×2): qty 34

## 2012-09-28 MED ORDER — OXYCODONE HCL 5 MG PO TABS
5.0000 mg | ORAL_TABLET | Freq: Once | ORAL | Status: AC
  Administered 2012-09-28: 5 mg via ORAL
  Filled 2012-09-28: qty 1

## 2012-09-28 MED ORDER — ENOXAPARIN SODIUM 150 MG/ML ~~LOC~~ SOLN
215.0000 mg | Freq: Two times a day (BID) | SUBCUTANEOUS | Status: DC
  Filled 2012-09-28: qty 2

## 2012-09-28 MED ORDER — OXYCODONE HCL 5 MG PO TABS
5.0000 mg | ORAL_TABLET | ORAL | Status: DC | PRN
  Administered 2012-09-29 (×3): 5 mg via ORAL
  Filled 2012-09-28 (×3): qty 1

## 2012-09-28 MED ORDER — WARFARIN SODIUM 5 MG PO TABS: 5.0000 mg | ORAL_TABLET | Freq: Once | ORAL | Status: DC

## 2012-09-28 MED ORDER — WARFARIN - PHARMACIST DOSING INPATIENT: Freq: Every day | Status: DC

## 2012-09-28 MED ORDER — FOLIC ACID 1 MG PO TABS
1.0000 mg | ORAL_TABLET | Freq: Every day | ORAL | Status: DC
Start: 2012-09-28 — End: 2012-09-30
  Administered 2012-09-28 – 2012-09-30 (×3): 1 mg via ORAL
  Filled 2012-09-28 (×3): qty 1

## 2012-09-28 NOTE — Progress Notes (Addendum)
ANTICOAGULATION CONSULT NOTE - Initial Consult  Pharmacy Consult for lovenox (switched from xarelto) Indication: PE/DVT  Allergies  Allergen Reactions  . Fish Allergy Hives and Shortness Of Breath    Gets dizzy  . Amoxicillin     REACTION: hives  . Other     Allergic to nuts. Causes mouth to itch, breaks out in hives, trouble breathing  . Strawberry     Makes mouth itch    Patient Measurements: Height: 5\' 8"  (172.7 cm) Weight: 477 lb 1.6 oz (216.411 kg) IBW/kg (Calculated) : 63.9  Heparin Dosing Weight: 215 kg  Vital Signs: Temp: 97.4 F (36.3 C) (11/28 0515) Temp src: Oral (11/28 0515) BP: 118/72 mmHg (11/28 0515) Pulse Rate: 84  (11/28 0515)  Labs:  Basename 09/28/12 0545 09/27/12 0500 09/26/12 1329 09/26/12 1125 09/26/12 0649 09/26/12 0027  HGB 7.5* 8.1* -- -- -- --  HCT 27.1* 27.8* -- -- 29.9* --  PLT 502* 507* -- -- 607* --  APTT -- -- -- -- -- --  LABPROT 16.0* 13.5 -- -- -- --  INR 1.31 1.04 -- -- -- --  HEPARINUNFRC -- -- 0.27* -- 0.18* --  CREATININE 0.80 0.86 -- -- 0.98 --  CKTOTAL -- -- -- -- -- --  CKMB -- -- -- -- -- --  TROPONINI -- -- -- <0.30 <0.30 <0.30    Estimated Creatinine Clearance: 189.8 ml/min (by C-G formula based on Cr of 0.8).   Medical History: Past Medical History  Diagnosis Date  . Asthma   . OSA (obstructive sleep apnea)   . History of blood clots   . Morbid obesity     Medications:  Scheduled:    . [COMPLETED] azithromycin  500 mg Oral Daily  . budesonide  0.5 mg Nebulization BID  . enoxaparin (LOVENOX) injection  215 mg Subcutaneous Q12H  . levalbuterol  0.63 mg Nebulization BID  . loratadine  10 mg Oral Daily  . losartan  100 mg Oral Daily  . sodium chloride  3 mL Intravenous Q12H  . sodium chloride  3 mL Intravenous Q12H  . warfarin  10 mg Oral Once  . Warfarin - Pharmacist Dosing Inpatient   Does not apply q1800  . [DISCONTINUED] coumadin book   Does not apply Once  . [DISCONTINUED] enoxaparin (LOVENOX)  injection  215 mg Subcutaneous Q12H  . [DISCONTINUED] enoxaparin   Does not apply Once  . [DISCONTINUED] levalbuterol  0.63 mg Nebulization Q6H  . [DISCONTINUED] rivaroxaban  15 mg Oral BID  . [DISCONTINUED] warfarin  10 mg Oral ONCE-1800  . [DISCONTINUED] warfarin   Does not apply Once  . [DISCONTINUED] Warfarin - Pharmacist Dosing Inpatient   Does not apply q1800   Infusions:    Assessment: 36 yo female with PE/DVT will be switched from xarelto back to Lovenox (was going to also put the patient on coumadin but decision was changed later on).  Last xarelto dose was at 0818 on 09/28/12.  Patient was concern that xarelto may not be as effective for her due to her weight.  INR this am was 1.31.  CrCl >100.  Goal of Therapy:  INR 2-3; 4hr anti-Xa level 0.6-1.2 Monitor platelets by anticoagulation protocol: Yes   Plan:  1) d/c xarelto 15mg  po bid 2) d/c coumadin 3) Lovenox 215mg  SQ every 12 hours, 1st dose at 2200 on 09/28/12 since the xarelto is BID dosing and MD doesn't want coumadin any more 4) INR may be falsely elevated due to xarelto 5) CBC every  72 hours       [name], [name] 09/28/2012,10:12 AM

## 2012-09-28 NOTE — Progress Notes (Signed)
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  Symptoms and D-dimer suggestive of PE but too large to get CT to confirm diagnosis.  Empiric anticoagulation was started with heparin and she was converted to lovenox today to start teaching.  She reports she previously underwent a hypercoagulable work up so will defer at this juncture. Pulm was consulted at time of admission and recommended outpatient CT in scanner large enough for patient to confirm diagnosis.  Also increased wheeze today.  Consider steroids and lasix tomorrow if not improving.  Needs A/C follow up and CT scheduled.      Assessment/Plan:  #1. Shortness of breath palpitation and previous history of PE and DVT and elevated D-dimer.   -  Troponins neg x 3 and BNP low -  Will need outpatient follow up for CTa to rule out PE at institution with larger CT scan or V/Q scan (although at this juncture we are unable to obtain due to machine (V/Q scanner) limitations. -  Appreciate pulmonology recommendations - Consulted Oncology and would very much like to thank Dr. Myna [name] who's input is greatly appreciated.  At this juncture patient will require further therapy as outlined in his note on 09/28/12.   - Should patient require long term anticoagulation she would prefer coumadin although she is aware that she will need to find a facility which will continue to monitor her INR levels.    #2. History of asthma  -  Continue azithromycin  -  Nebs and budesonide - improved on this regimen with no wheezes 09/28/12  #3. Hypertension - Continue home medications, currently well controlled with blood pressure of 124/80 #4. History of OSA - noncompliant with CPAP, continue as inpatient and have discussed with care manager to help set up referral for  outpatient sleep study and paper work has been signed and filled out by care manager ( I would like to thank care manager for her assistance) #5. Chronic iron deficiency anemia - Per Oncology.  Patient to get folate and Iron replacement today 09/29/12. Will recheck cbc level tomorrow. #6.  Tachycardia: resolved and initially likely due to anemia and discomfort #7.  Dysuria: Resolved and Urine culture negative.  Will not start antibiotic regimen for UTI at this juncture.  DIET:  Healthy heart ACCESS: PIV IVF:  OFF PROPH:  Lovenox/ IVC filter  Code Status: full code Family Communication: with patient and family at bedside Disposition Plan: home pending on further recommendations from specialist and clinical improvement.   Consultants:  Pulmonary   oncology  Procedures:  None  Antibiotics:  Azithromycin   Rocephin  HPI/Subjective: No new complaints today.  No acute issues overnight. Had long discussion with patient about risks of taking xarelto vs risk associated with coumadin and pros and cons.  After our discussion patient should she require long term anticoagulation would prefer coumadin therapy and she is aware that she will have to find an office that could check her INR levels if needed.  I have handed a print out from Up to date with information regarding xarelto.  Objective: Filed Vitals:   09/27/12 1947 09/27/12 2103 09/28/12 0515 09/28/12 0748  BP:  116/60 118/72   Pulse:  98 84   Temp:  98.4 F (36.9 C) 97.4 F (36.3 C)   TempSrc:  Oral  Oral   Resp:  16 17   Height:      Weight:      SpO2: 99% 98% 97% 98%    Intake/Output Summary (Last 24 hours) at 09/28/12 1453 Last data filed at 09/28/12 0600  Gross per 24 hour  Intake      0 ml  Output    760 ml  Net   -760 ml   Filed Weights   09/25/12 2100 09/26/12 0007  Weight: 210.469 kg (464 lb) 216.411 kg (477 lb 1.6 oz)    Exam:   General:  [name], no acute distress  HEENT:  MMM  Cardiovascular:  regular rhythm, no mrg  Respiratory: BS BL, no rhales, no wheezes  Abdomen: NABS, soft, obese, nontender  MSK:  No calf pain BL, negative homans  Data Reviewed: Basic Metabolic Panel:  Lab 09/28/12 0454 09/27/12 0500 09/26/12 0649 09/25/12 2025  NA 139 137 137 139  K 4.0 3.7 3.9 3.7  CL 105 103 100 103  CO2 27 26 26  --  GLUCOSE 114* 139* 146* 123*  BUN 12 14 13 15   CREATININE 0.80 0.86 0.98 1.00  CALCIUM 9.0 8.6 8.9 --  MG -- -- -- --  PHOS -- -- -- --   Liver Function Tests: No results found for this basename: AST:5,ALT:5,ALKPHOS:5,BILITOT:5,PROT:5,ALBUMIN:5 in the last 168 hours No results found for this basename: LIPASE:5,AMYLASE:5 in the last 168 hours No results found for this basename: AMMONIA:5 in the last 168 hours CBC:  Lab 09/28/12 0545 09/27/12 0500 09/26/12 0649 09/25/12 2025 09/25/12 2022  WBC 9.6 9.0 8.5 -- 9.9  NEUTROABS -- -- 4.3 -- 6.7  HGB 7.5* 8.1* 8.7* 10.2* 8.5*  HCT 27.1* 27.8* 29.9* 30.0* 29.1*  MCV 56.9* 57.3* 56.7* -- 56.6*  PLT 502* 507* 607* -- 589*   Cardiac Enzymes:  Lab 09/26/12 1125 09/26/12 0649 09/26/12 0027  CKTOTAL -- -- --  CKMB -- -- --  CKMBINDEX -- -- --  TROPONINI <0.30 <0.30 <0.30   BNP (last 3 results)  Basename 09/26/12 0027  PROBNP 17.8   CBG: No results found for this basename: GLUCAP:5 in the last 168 hours  Recent Results (from the past 240 hour(s))  URINE CULTURE     Status: Normal   Collection Time   09/27/12 11:00 AM      Component Value Range Status Comment   Specimen Description URINE, CLEAN CATCH   Final    Special Requests NONE   Final    Culture  Setup Time 09/27/2012 12:12   Final    Colony Count 65,000 COLONIES/ML   Final    Culture     Final    Value: Multiple bacterial morphotypes present, none predominant. Suggest appropriate recollection if clinically indicated.   Report Status 09/28/2012 FINAL   Final      Studies: No results found.  Scheduled Meds:    . [COMPLETED] azithromycin  500  mg Oral Daily  . budesonide  0.5 mg Nebulization BID  . enoxaparin (LOVENOX) injection  215 mg Subcutaneous Q12H  . ferumoxytol  1,020 mg Intravenous Once  . folic acid  1 mg Oral Daily  . levalbuterol  0.63 mg Nebulization BID  . loratadine  10 mg Oral Daily  . losartan  100 mg Oral Daily  . sodium chloride  3 mL Intravenous Q12H  . sodium chloride  3 mL Intravenous Q12H  . [DISCONTINUED] enoxaparin (LOVENOX) injection  215 mg Subcutaneous Q12H  . [DISCONTINUED] levalbuterol  0.63  mg Nebulization Q6H  . [DISCONTINUED] rivaroxaban  15 mg Oral BID  . [DISCONTINUED] warfarin  10 mg Oral Once  . [DISCONTINUED] warfarin  5 mg Oral ONCE-1800  . [DISCONTINUED] Warfarin - Pharmacist Dosing Inpatient   Does not apply q1800   Continuous Infusions:    Principal Problem:  *SOB (shortness of breath) Active Problems:  Morbid obesity  ASTHMA  Anemia  Pulmonary embolism    Time spent: 30    Penny Pia  [facility] Pager [phone]. If 8PM-8AM, please contact night-coverage at [email or web address], password [facility] 09/28/2012, 2:53 PM  LOS: 3 days

## 2012-09-28 NOTE — Consult Note (Signed)
[name], [name] NO.:  [id number]  MEDICAL RECORD NO.:  [id number]  LOCATION:  2028                         FACILITY:  [facility]  PHYSICIAN:  Josph Macho, M.D.  DATE OF BIRTH:  November 23, 1975  DATE OF CONSULTATION: DATE OF DISCHARGE:                                CONSULTATION   REFERRING PHYSICIAN:  Penny Pia, MD.  REASON FOR CONSULTATION: 1. Recurrent pulmonary embolism/DVT. 2. Severe anemia -- iron deficient. 3. Sickle cell trait. 4. Morbid obesity.  HISTORY OF PRESENT ILLNESS:  The patient is incredibly charming 36 year old, African female.  She has sickle cell trait.  She has never had any problems with this from what she says.  She is not on folic acid for this.  I will go ahead and put her on folic acid.  She says she has had blood clots in the past.  First blood clot was back in 2006.  This is in her left leg.  She then had a pulmonary embolism.  She was last on Coumadin back in 2007 and 2008.  She is on Coumadin for years.  She has not had any obvious blood clot history issues since then.  I am sure she is following checks for hypercoagulability.  There is no history of blood clots in the family.  She was admitted, I think on the 25th.  She is complaining of shortness of breath and palpitations.  She had Dopplers of her legs which were negative.  Because of her size, she could not have a CT scan.  A V/Q scan was ordered, but has not been done.  She currently is on Lovenox and Coumadin.  She has obstructive sleep apnea.  She is on CPAP, which she does feel helps a little bit.  She does have asthma.  She says she gets a lot of this from her father's side of the family.  She is also markedly anemic.  Today, hemoglobin is 7.5.  MCV is 57, platelet count is 5O2.  I am sure she is iron deficient.  Last iron studies were done 4 years ago.  Her ferritin at that time was only 24 with iron saturation of 5%. She says she has never had IV  iron.  She is on oral iron which clearly is not working now.  She has no problems with her kidneys.  I was asked to see her if I can try to help with her hematologic issues.  She has had a 79 pounds weight loss over a year and a half.  She has tried to lose weight.  She wants to do a gastric bypass but Medicaid will not cover this.  She has had no hair loss.  She has had no diarrhea.  She has had no rashes.  PAST MEDICAL HISTORY: 1. Morbid obesity. 2. History of thromboembolic disease. 3. Obstructive sleep apnea. 4. Cholecystectomy. 5. Asthma.  ALLERGIES: 1. Fish. 2. Amoxicillin. 3. Strawberry.  HOME MEDICATIONS:  Zyrtec 10 mg p.o. daily, Proventil inhaler 2 puffs every 12 hours p.r.n., Advair Diskus (250/50) 1 puff every 12 hours, Vicodin (5/500) 1 p.o. every 12 hours p.r.n., losartan 100 mg p.o. daily, Advil  600 mg p.o. every 6 hours p.r.n., and tramadol 50 mg p.o. every 8 hours p.r.n.  SOCIAL HISTORY:  Negative for tobacco use.  There is no alcohol use.  FAMILY HISTORY:  Remarkable for her father currently is dying of acute leukemia.  She also has history of mesothelioma.  She also has the sickle trait.  Results of asthma in the family.  REVIEW OF SYSTEMS:  As stated in history of present illness.  PHYSICAL EXAMINATION:  GENERAL:  This is a morbidly obese, black female, in no obvious distress.  She is alert and oriented x3. VITAL SIGNS:  Temperature of 97.4, pulse 84, respiratory rate 17, blood pressure 118/72, oxygen saturation 98% on room air. HEAD AND NECK:  Normocephalic, atraumatic skull.  There is no scleral icterus.  She has no thrush.  There is no adenopathy in the neck. LUNGS:  Clear bilaterally. CARDIAC:  Regular rate and rhythm with normal S1 and S2.  There are no murmurs, rubs, or bruits. ABDOMEN:  Morbidly obese.  Soft.  She has good bowel sounds.  There is no fluid wave.  There is no palpable hepatosplenomegaly. BACK:  No tenderness over the spine,  ribs, or hips. EXTREMITIES:  Shows no obvious venous cord in her legs.  She has good range of motion of her joints. SKIN:  No rashes, ecchymosis, or petechia.  LABORATORY STUDIES:  White cell count is 9.6, hemoglobin 7.5, hematocrit 27.1, platelet count 502.  MCV is 57.  BUN is 12, creatinine 0.8, calcium 9, potassium 4.0.  IMPRESSION:  [name] is a 36 year old female with sickle cell trait. She also has iron deficiency anemia.  We will go and give her some IV iron with Feraheme at 1020 mg.  This will help with her anemia.  Also, put her on folic acid, this will help with any kind of sickle cell component of her anemia.  I think, it is very interesting about the blood clot situation.  She does not have blood clot now for 6 years.  It is possible at this chest discomfort and shortness of breath because of her being anemic.  I think it is key and critical that she get a V/Q scan, so that we can see if there is a blood clot there.  If not, I would hold on long-term anticoagulation.  I do think that having an IVC filter placed would be helpful.  Again, she is at high risk for thromboembolic disease because of her weight. There is some literature that can support doing an IVC filter proactively.  Ultimately, I think that gastric bypass would help her more than anything else.  I am unsure exactly done while she is in the hospital. I think a gastric bypass consult acquired fever problems.  I am not sure that hypercoagulable studies are really necessary.  There is no family history of thromboembolic disease.  She is morbidly obese, which is her risk factor for thromboembolism.  We will follow her along.  I think we applied hold off on any transfusion given her hemoglobin right now.  I would check a reticulocyte count on her.     Josph Macho, M.D.     PRE/MEDQ  D:  09/28/2012  T:  09/28/2012  Job:  [id number]

## 2012-09-28 NOTE — Consult Note (Signed)
#   [id number] is note.  Pete E.  Romans 5:3-5

## 2012-09-29 DIAGNOSIS — D509 Iron deficiency anemia, unspecified: Secondary | ICD-10-CM | POA: Diagnosis present

## 2012-09-29 LAB — BASIC METABOLIC PANEL
BUN: 10 mg/dL (ref 6–23)
CO2: 26 mEq/L (ref 19–32)
Calcium: 9 mg/dL (ref 8.4–10.5)
Creatinine, Ser: 0.91 mg/dL (ref 0.50–1.10)
Glucose, Bld: 124 mg/dL — ABNORMAL HIGH (ref 70–99)
Sodium: 135 mEq/L (ref 135–145)

## 2012-09-29 LAB — CBC
HCT: 30.5 % — ABNORMAL LOW (ref 36.0–46.0)
Hemoglobin: 8.9 g/dL — ABNORMAL LOW (ref 12.0–15.0)
MCH: 16.7 pg — ABNORMAL LOW (ref 26.0–34.0)
MCV: 57.1 fL — ABNORMAL LOW (ref 78.0–100.0)
RBC: 5.34 MIL/uL — ABNORMAL HIGH (ref 3.87–5.11)

## 2012-09-29 MED ORDER — RIVAROXABAN 15 MG PO TABS
15.0000 mg | ORAL_TABLET | Freq: Two times a day (BID) | ORAL | Status: DC
  Administered 2012-09-29 – 2012-09-30 (×3): 15 mg via ORAL
  Filled 2012-09-29 (×4): qty 1

## 2012-09-29 NOTE — Progress Notes (Signed)
[facility] PROGRESS NOTE  [name] JYN:[id number] DOB: Dec 20, 1975 DOA: 09/25/2012 PCP: Pcp Not In System  35 yo F with hx of DVT/PE previously on anticoagulation which was discontinued p/w acute onset of SOB while walking around house.  Symptoms and D-dimer suggestive of PE but too large to get CT to confirm diagnosis.  Empiric anticoagulation was started with heparin and she was converted to lovenox today to start teaching.  She reports she previously underwent a hypercoagulable work up so will defer at this juncture. Pulm was consulted at time of admission and recommended outpatient CT in scanner large enough for patient to confirm diagnosis.  Also increased wheeze today.  Consider steroids and lasix tomorrow if not improving.  Needs A/C follow up and CT scheduled.      Assessment/Plan:  #1. Shortness of breath palpitation and previous history of PE and DVT and elevated D-dimer.   -  Troponins neg x 3 and BNP low -  Will need outpatient follow up for CTa to rule out PE at institution with larger CT scan or V/Q scan (although at this juncture we are unable to obtain due to machine (V/Q scanner) limitations. -  Appreciate pulmonology recommendations - Consulted Oncology and would very much like to thank Dr. Myna Hidalgo who's input is greatly appreciated.  At this juncture patient will require further therapy as outlined in his note on 09/29/12. - Will look into seeing if we can get patient an imaging study today.  If tolerating xarelto would consider discharging soon given that patient has been stable with no SOB, tachycardia, or hypotension.     #2. History of asthma  -  Continue azithromycin  -  Nebs and budesonide - improved on this regimen with no wheezes 09/28/12  #3. Hypertension - Continue home medications, currently well controlled with blood pressure of 113/71  #4. History of OSA - noncompliant with CPAP, continue as inpatient and have discussed with care manager to help set  up referral for outpatient sleep study and paper work has been signed and filled out by care manager ( I would like to thank care manager for her assistance)  #5. Chronic iron deficiency anemia -Oncology managing.  Would consider ferrous sulfate 325 mg po TID once ready for discharge.  Hgb up today to 8.9  #6.  Tachycardia: resolved and initially likely due to anemia and discomfort  #7.  Dysuria: Resolved and Urine culture negative.  Will not start antibiotic regimen for UTI at this juncture.  DIET:  Healthy heart ACCESS: PIV IVF:  OFF PROPH:  Lovenox/ IVC filter  Code Status: full code Family Communication: with patient and family at bedside Disposition Plan: home pending on further recommendations from specialist and clinical improvement.   Consultants:  Pulmonary   oncology  Procedures:  None  Antibiotics:  Azithromycin   Rocephin  HPI/Subjective: Patient has been complaining of headache today.  No acute issues overnight. We have discussed plan for when patient transitions out of hospital.  She is agreeable to taking the xarelto with follow up to imaging to r/o PE.  Objective: Filed Vitals:   09/28/12 1459 09/28/12 1956 09/28/12 2119 09/29/12 0608  BP: 102/48  93/62 113/71  Pulse: 99  95 95  Temp: 98 F (36.7 C)  98.5 F (36.9 C) 98.4 F (36.9 C)  TempSrc: Oral  Axillary Oral  Resp: 18     Height:      Weight:      SpO2: 99% 98% 99% 98%  No intake or output data in the 24 hours ending 09/29/12 0916 Filed Weights   09/25/12 2100 09/26/12 0007  Weight: 210.469 kg (464 lb) 216.411 kg (477 lb 1.6 oz)    Exam:   General:  [name], no acute distress  HEENT:  MMM  Cardiovascular: regular rhythm, no mrg  Respiratory: BS BL, no rhales, no wheezes  Abdomen: NABS, soft, obese, nontender  MSK:  No calf pain BL, negative homans  Data Reviewed: Basic Metabolic Panel:  Lab 09/29/12 1610 09/28/12 0545 09/27/12 0500 09/26/12 0649 09/25/12 2025  NA 135  139 137 137 139  K 3.6 4.0 3.7 3.9 3.7  CL 101 105 103 100 103  CO2 26 27 26 26  --  GLUCOSE 124* 114* 139* 146* 123*  BUN 10 12 14 13 15   CREATININE 0.91 0.80 0.86 0.98 1.00  CALCIUM 9.0 9.0 8.6 8.9 --  MG -- -- -- -- --  PHOS -- -- -- -- --   Liver Function Tests: No results found for this basename: AST:5,ALT:5,ALKPHOS:5,BILITOT:5,PROT:5,ALBUMIN:5 in the last 168 hours No results found for this basename: LIPASE:5,AMYLASE:5 in the last 168 hours No results found for this basename: AMMONIA:5 in the last 168 hours CBC:  Lab 09/29/12 0455 09/28/12 0545 09/27/12 0500 09/26/12 0649 09/25/12 2025 09/25/12 2022  WBC 10.7* 9.6 9.0 8.5 -- 9.9  NEUTROABS -- -- -- 4.3 -- 6.7  HGB 8.9* 7.5* 8.1* 8.7* 10.2* --  HCT 30.5* 27.1* 27.8* 29.9* 30.0* --  MCV 57.1* 56.9* 57.3* 56.7* -- 56.6*  PLT 527* 502* 507* 607* -- 589*   Cardiac Enzymes:  Lab 09/26/12 1125 09/26/12 0649 09/26/12 0027  CKTOTAL -- -- --  CKMB -- -- --  CKMBINDEX -- -- --  TROPONINI <0.30 <0.30 <0.30   BNP (last 3 results)  Basename 09/26/12 0027  PROBNP 17.8   CBG: No results found for this basename: GLUCAP:5 in the last 168 hours  Recent Results (from the past 240 hour(s))  URINE CULTURE     Status: Normal   Collection Time   09/27/12 11:00 AM      Component Value Range Status Comment   Specimen Description URINE, CLEAN CATCH   Final    Special Requests NONE   Final    Culture  Setup Time 09/27/2012 12:12   Final    Colony Count 65,000 COLONIES/ML   Final    Culture     Final    Value: Multiple bacterial morphotypes present, none predominant. Suggest appropriate recollection if clinically indicated.   Report Status 09/28/2012 FINAL   Final      Studies: No results found.  Scheduled Meds:    . [COMPLETED] azithromycin  500 mg Oral Daily  . budesonide  0.5 mg Nebulization BID  . ferumoxytol  1,020 mg Intravenous Once  . folic acid  1 mg Oral Daily  . levalbuterol  0.63 mg Nebulization BID  . loratadine   10 mg Oral Daily  . losartan  100 mg Oral Daily  . [COMPLETED] oxyCODONE  5 mg Oral Once  . rivaroxaban  15 mg Oral BID  . sodium chloride  3 mL Intravenous Q12H  . [DISCONTINUED] enoxaparin (LOVENOX) injection  215 mg Subcutaneous Q12H  . [DISCONTINUED] enoxaparin (LOVENOX) injection  215 mg Subcutaneous Q12H  . [DISCONTINUED] rivaroxaban  15 mg Oral BID  . [DISCONTINUED] sodium chloride  3 mL Intravenous Q12H  . [DISCONTINUED] warfarin  10 mg Oral Once  . [DISCONTINUED] warfarin  5 mg Oral ONCE-1800  . [  DISCONTINUED] Warfarin - Pharmacist Dosing Inpatient   Does not apply q1800   Continuous Infusions:    Principal Problem:  *SOB (shortness of breath) Active Problems:  Morbid obesity  ASTHMA  Anemia  Pulmonary embolism  Iron deficiency anemia    Time spent: 30    Penny Pia  [facility] Pager [phone]. If 8PM-8AM, please contact night-coverage at [email or web address], password [facility] 09/29/2012, 9:16 AM  LOS: 4 days

## 2012-09-29 NOTE — Progress Notes (Addendum)
There is no real change overall with [name]'s condition. Her hemoglobin is 8.9. She was started on folic acid yesterday. She will give her IV iron today.  She cannot have a V/Q scan. She's too large. An outpatient CT scan will need to be arranged. As such, she will have to be on anticoagulation until we know for sure whether or not there is a pulmonary embolism. I think the easiest way to anticoagulate her is to get her on Xarelto. I realized that there are very few, if any, studies looking at Xarelto in the morbidly obese. However, I think Xarelto at 15 mg by mouth twice a day would be reasonable.  She does not want have an IVC filter placed. I think a filter would be a good idea but she would not prefer to have this.   All her vital signs are stable. Oxygen saturation is 98% on room air. There really is no change in her overall physical exam.   Her iron studies clearly show iron deficiency. Her ferritin is only 10 with an iron saturation of 5%. She has good renal function. Her corrected reticulocyte count is less than 1. This goes along with iron deficiency more so than any sickle cell issues.  She does have some thrombocytosis. I don't believe this is an issue with her potential hyper coagulability. I feel that the thrombocytosis is reflective of iron deficiency anemia.  We will continue to follow her along.   Max Fickle 2:9-11

## 2012-09-30 DIAGNOSIS — F329 Major depressive disorder, single episode, unspecified: Secondary | ICD-10-CM

## 2012-09-30 LAB — CBC
HCT: 26.2 % — ABNORMAL LOW (ref 36.0–46.0)
MCV: 57.6 fL — ABNORMAL LOW (ref 78.0–100.0)
RDW: 19.9 % — ABNORMAL HIGH (ref 11.5–15.5)
WBC: 9.4 10*3/uL (ref 4.0–10.5)

## 2012-09-30 LAB — BASIC METABOLIC PANEL
BUN: 10 mg/dL (ref 6–23)
CO2: 26 mEq/L (ref 19–32)
Chloride: 102 mEq/L (ref 96–112)
Creatinine, Ser: 0.83 mg/dL (ref 0.50–1.10)
GFR calc Af Amer: >90 mL/min (ref >90)

## 2012-09-30 MED ORDER — RIVAROXABAN 15 MG PO TABS: 15.0000 mg | ORAL_TABLET | Freq: Two times a day (BID) | ORAL | Status: DC

## 2012-09-30 MED ORDER — FOLIC ACID 1 MG PO TABS: 1.0000 mg | ORAL_TABLET | Freq: Every day | ORAL | Status: DC

## 2012-09-30 MED ORDER — FERROUS SULFATE 325 (65 FE) MG PO TABS: 325.0000 mg | ORAL_TABLET | Freq: Every day | ORAL | Status: DC

## 2012-09-30 NOTE — Progress Notes (Signed)
Pt stated she wasn't ready for CPAP at 2330.  Pt stated she would notify nurse when she was ready for CPAP, so nurse can call RT.

## 2012-09-30 NOTE — Discharge Summary (Signed)
Physician Discharge Summary  [name] ZOX:[id number] DOB: Mar 16, 1976 DOA: 09/25/2012  PCP: Pcp Not In System  Admit date: 09/25/2012 Discharge date: 09/30/2012  Time spent: > 35 minutes  Recommendations for Outpatient Follow-up:  1. Follow up on hemoglobin levels 2. Follow up on CT angiogram of chest or V/Q scan to be scheduled or done as outpatient.  Patient to follow up and has agreed to do so this coming Monday 10/02/12, I have provided script.  Discharge Diagnoses:  Principal Problem:  *SOB (shortness of breath) Active Problems:  Morbid obesity  ASTHMA  Anemia  Pulmonary embolism  Iron deficiency anemia   Discharge Condition: stable  Diet recommendation: Cardiac diet  Filed Weights   09/25/12 2100 09/26/12 0007  Weight: 210.469 kg (464 lb) 216.411 kg (477 lb 1.6 oz)    History of present illness:  From original HPI: 36 year-old female with history of morbid obesity, bronchial asthma, OSA noncompliant with CPAP and previous history of PE and DVT presents with complaints of shortness of breath and symptoms of upper respiratory tract infection. She had gone to the urgent care Center and was referred to the ER because of patient complaining of left calf pain. As per the ER physician Doppler done in the ER did not show any DVT. Patient is morbidly obese and due to her weight and size CT angiogram of the chest was not able to be done to rule out PE. Patient states that her symptoms started 2 days ago with shortness of breath and palpitation with productive cough. Later she tried over-the-counter medications which did not help and she decided to come to the ER. Denies any chest pain fever chills. In the ER patient was given initially nebulizer treatment. Chest x-ray does not show any infiltrates. At this time patient has been empirically started on IV heparin for possible PE. Her d-dimer was mildly elevated.    Hospital Course:  #1. Shortness of breath palpitation and previous  history of PE and DVT and elevated D-dimer.  - Troponins neg x 3 and BNP low  - Will need outpatient follow up for CTA or VQ scan to rule out PE at institution with larger CT scan or V/Q scan (although at this juncture we are unable to obtain due to machine (V/Q scanner) limitations.)  Patient verbalizes understanding and agreement.   - Consulted Oncology and would very much like to thank Dr. Myna Hidalgo who's input is greatly appreciated. At this juncture patient will require further therapy as outlined in his note on 09/29/12.  - As per oncology's recommendations patient is to continue on the xeralto until PE ruled out.  - On the differential is panic disorder and flair as patient reports that her father was recently told that he did not have a lot of time to live due to new diagnosis of leukemia.   -While in house patient was monitored for several days her vitals have been stable and she has not had any reported shortness since I have cared for the patient.  No cough, hemoptysis, fevers, chest pain, or tachypnea.  As a result index of suspicion for PE is low. Nonetheless will provide 20 day script for xarelto until patient is able to r/o PE.  #2. History of asthma  - Patient to continue home regimen.  Afebrile with no Increased WOB or elevated WBC on day of discharge.   - improved on this regimen with no wheezes 09/28/12  #3. Hypertension - Continue home medications, currently well controlled with  blood pressure of 113/71   #4. History of OSA - noncompliant with CPAP, continue as inpatient and have discussed with care manager to help set up referral for outpatient sleep study and paper work has been signed and filled out by care manager ( I would like to thank care manager for her assistance). Patient is aware that she needs to get this test repeated as outpatient.  #5. Chronic iron deficiency anemia -Oncology managing. Would consider ferrous sulfate 325 mg po TID once ready for discharge. Patient  will need to get hemoglobin rechecked as outpatient.  Expect hemoglobin to improve with iron supplementation.  Will also provide script for folate.  #6. Tachycardia: resolved and initially likely due to anemia and discomfort   #7. Dysuria: Resolved and Urine culture negative. Patient afebrile with normal WBC on discharge and asymptomatic.    Procedures:  Lower extremity dopplers: negative for dvt  Consultations:  Oncology  Discharge Exam: Filed Vitals:   09/29/12 1353 09/29/12 2019 09/29/12 2134 09/30/12 0603  BP: 108/56  109/56 129/76  Pulse: 100  99 88  Temp: 98.4 F (36.9 C)  98.6 F (37 C) 98.5 F (36.9 C)  TempSrc: Oral  Oral Oral  Resp: 20  20 20   Height:      Weight:      SpO2: 100% 97% 93% 96%    General: Patient in NAD, Alert and Awake and smiling Cardiovascular: RRR, No MRG Respiratory: Clear to auscultation, no wheezes, no rhales  Discharge Instructions  Discharge Orders    Future Appointments: Provider: Department: Dept Phone: Center:   11/27/2012 4:00 PM Oretha Milch, MD [facility] [phone] None     Future Orders Please Complete By Expires   Diet - low sodium heart healthy      Increase activity slowly      Discharge instructions      Comments:   Please be sure to follow up with a primary care physician in 1-2 weeks.  I have provided a script for your xarelto and for your iron deficiency anemia.  The xarelto I will provide script for 20 days and as such you will need to see a PCP for refill should you have proof of a clot.    As per our conversation you are to go to an outpatient imaging center to obtain imaging studies to rule out PE (either CT angiogram or V/Q scan).  Continue to take you xarelto in the mean while.   Can also follow up with Dr. Myna Hidalgo (hematologist/oncologist) for further recommendations.   Call MD for:  temperature >100.4      Call MD for:  difficulty breathing, headache or visual disturbances      Call MD for:   persistant dizziness or light-headedness      Call MD for:  extreme fatigue          Medication List     As of 09/30/2012 10:24 AM    STOP taking these medications         azithromycin 250 MG tablet   Commonly known as: ZITHROMAX      ibuprofen 200 MG tablet   Commonly known as: ADVIL,MOTRIN      NYQUIL PO      TAKE these medications         albuterol 108 (90 BASE) MCG/ACT inhaler   Commonly known as: PROVENTIL HFA;VENTOLIN HFA   Inhale 2 puffs into the lungs 2 (two) times daily as needed. For shortness of breath  cetirizine 10 MG tablet   Commonly known as: ZYRTEC   Take 10 mg by mouth daily as needed. For allergies      ferrous sulfate 325 (65 FE) MG tablet   Take 1 tablet (325 mg total) by mouth daily with breakfast.      Fluticasone-Salmeterol 250-50 MCG/DOSE Aepb   Commonly known as: ADVAIR   Inhale 1 puff into the lungs every 12 (twelve) hours.      folic acid 1 MG tablet   Commonly known as: FOLVITE   Take 1 tablet (1 mg total) by mouth daily.      HYDROcodone-acetaminophen 5-500 MG per tablet   Commonly known as: VICODIN   Take 1 tablet by mouth every 12 (twelve) hours as needed for pain. For pain      losartan 100 MG tablet   Commonly known as: COZAAR   Take 1 tablet (100 mg total) by mouth daily.      Rivaroxaban 15 MG Tabs tablet   Commonly known as: XARELTO   Take 1 tablet (15 mg total) by mouth 2 (two) times daily.      traMADol 50 MG tablet   Commonly known as: ULTRAM   Take 1 tablet (50 mg total) by mouth every 8 (eight) hours as needed for pain. For pain           Follow-up Information    Follow up with Oretha Milch., MD. On 11/27/2012. (4:00pm )    Contact information:   [address] [place] [address] [phone]           The results of significant diagnostics from this hospitalization (including imaging, microbiology, ancillary and laboratory) are listed below for reference.    Significant Diagnostic Studies: Dg  Chest 2 View  09/25/2012  *RADIOLOGY REPORT*  Clinical Data: Shortness of breath.  Hypertension.  CHEST - 2 VIEW  Comparison: Chest x-ray 10/19/2006.  Findings: Lung volumes are normal.  No consolidative airspace disease.  No pleural effusions.  No pneumothorax.  No pulmonary nodule or mass noted.  Pulmonary vasculature and the cardiomediastinal silhouette are within normal limits.  IMPRESSION: 1. No radiographic evidence of acute cardiopulmonary disease.   Original Report Authenticated By: Trudie Reed, M.D.     Microbiology: Recent Results (from the past 240 hour(s))  URINE CULTURE     Status: Normal   Collection Time   09/27/12 11:00 AM      Component Value Range Status Comment   Specimen Description URINE, CLEAN CATCH   Final    Special Requests NONE   Final    Culture  Setup Time 09/27/2012 12:12   Final    Colony Count 65,000 COLONIES/ML   Final    Culture     Final    Value: Multiple bacterial morphotypes present, none predominant. Suggest appropriate recollection if clinically indicated.   Report Status 09/28/2012 FINAL   Final      Labs: Basic Metabolic Panel:  Lab 09/30/12 2952 09/29/12 0455 09/28/12 0545 09/27/12 0500 09/26/12 0649  NA 136 135 139 137 137  K 3.9 3.6 4.0 3.7 3.9  CL 102 101 105 103 100  CO2 26 26 27 26 26   GLUCOSE 122* 124* 114* 139* 146*  BUN 10 10 12 14 13   CREATININE 0.83 0.91 0.80 0.86 0.98  CALCIUM 9.0 9.0 9.0 8.6 8.9  MG -- -- -- -- --  PHOS -- -- -- -- --   Liver Function Tests: No results found for this basename:  AST:5,ALT:5,ALKPHOS:5,BILITOT:5,PROT:5,ALBUMIN:5 in the last 168 hours No results found for this basename: LIPASE:5,AMYLASE:5 in the last 168 hours No results found for this basename: AMMONIA:5 in the last 168 hours CBC:  Lab 09/30/12 0435 09/29/12 0455 09/28/12 0545 09/27/12 0500 09/26/12 0649 09/25/12 2022  WBC 9.4 10.7* 9.6 9.0 8.5 --  NEUTROABS -- -- -- -- 4.3 6.7  HGB 7.7* 8.9* 7.5* 8.1* 8.7* --  HCT 26.2* 30.5* 27.1*  27.8* 29.9* --  MCV 57.6* 57.1* 56.9* 57.3* 56.7* --  PLT 464* 527* 502* 507* 607* --   Cardiac Enzymes:  Lab 09/26/12 1125 09/26/12 0649 09/26/12 0027  CKTOTAL -- -- --  CKMB -- -- --  CKMBINDEX -- -- --  TROPONINI <0.30 <0.30 <0.30   BNP: BNP (last 3 results)  Basename 09/26/12 0027  PROBNP 17.8   CBG: No results found for this basename: GLUCAP:5 in the last 168 hours     Signed:  Penny Pia  [facility] 09/30/2012, 10:24 AM

## 2012-10-26 ENCOUNTER — Ambulatory Visit (HOSPITAL_BASED_OUTPATIENT_CLINIC_OR_DEPARTMENT_OTHER): Payer: Medicaid Other

## 2012-11-24 ENCOUNTER — Emergency Department (HOSPITAL_COMMUNITY)
Admission: EM | Admit: 2012-11-24 | Discharge: 2012-11-24 | Disposition: A | Discharge status: Home / Self Care | Payer: Medicaid Other | Source: Home / Self Care | Attending: Family Medicine | Admitting: Family Medicine

## 2012-11-24 ENCOUNTER — Ambulatory Visit
Admission: RE | Admit: 2012-11-24 | Discharge: 2012-11-24 | Disposition: A | Discharge status: Home / Self Care | Payer: Medicaid Other | Source: Ambulatory Visit | Attending: Family Medicine | Admitting: Family Medicine

## 2012-11-24 ENCOUNTER — Encounter (HOSPITAL_COMMUNITY): Payer: Self-pay

## 2012-11-24 ENCOUNTER — Other Ambulatory Visit: Payer: Self-pay | Admitting: Family Medicine

## 2012-11-24 DIAGNOSIS — R0602 Shortness of breath: Secondary | ICD-10-CM

## 2012-11-24 DIAGNOSIS — G4733 Obstructive sleep apnea (adult) (pediatric): Secondary | ICD-10-CM

## 2012-11-24 DIAGNOSIS — D649 Anemia, unspecified: Secondary | ICD-10-CM

## 2012-11-24 DIAGNOSIS — J45909 Unspecified asthma, uncomplicated: Secondary | ICD-10-CM

## 2012-11-24 DIAGNOSIS — D509 Iron deficiency anemia, unspecified: Secondary | ICD-10-CM

## 2012-11-24 DIAGNOSIS — J309 Allergic rhinitis, unspecified: Secondary | ICD-10-CM

## 2012-11-24 DIAGNOSIS — M549 Dorsalgia, unspecified: Secondary | ICD-10-CM

## 2012-11-24 DIAGNOSIS — IMO0002 Reserved for concepts with insufficient information to code with codable children: Secondary | ICD-10-CM

## 2012-11-24 LAB — CBC
MCH: 21.5 pg — ABNORMAL LOW (ref 26.0–34.0)
MCHC: 31.5 g/dL (ref 30.0–36.0)
MCV: 68.2 fL — ABNORMAL LOW (ref 78.0–100.0)
Platelets: 440 10*3/uL — ABNORMAL HIGH (ref 150–400)
RDW: 25.8 % — ABNORMAL HIGH (ref 11.5–15.5)
WBC: 9 10*3/uL (ref 4.0–10.5)

## 2012-11-24 LAB — COMPREHENSIVE METABOLIC PANEL
AST: 10 U/L (ref 0–37)
Albumin: 3 g/dL — ABNORMAL LOW (ref 3.5–5.2)
Calcium: 9.3 mg/dL (ref 8.4–10.5)
Creatinine, Ser: 0.97 mg/dL (ref 0.50–1.10)

## 2012-11-24 MED ORDER — ESCITALOPRAM OXALATE 10 MG PO TABS: 10.0000 mg | ORAL_TABLET | Freq: Every day | ORAL | Status: DC

## 2012-11-24 MED ORDER — IOHEXOL 350 MG/ML SOLN
150.0000 mL | Freq: Once | INTRAVENOUS | Status: AC | PRN
  Administered 2012-11-24: 150 mL via INTRAVENOUS

## 2012-11-24 MED ORDER — FERROUS SULFATE 325 (65 FE) MG PO TABS: 325.0000 mg | ORAL_TABLET | Freq: Every day | ORAL | Status: DC

## 2012-11-24 MED ORDER — TRAMADOL HCL 50 MG PO TABS: 50.0000 mg | ORAL_TABLET | Freq: Three times a day (TID) | ORAL | Status: DC | PRN

## 2012-11-24 MED ORDER — FOLIC ACID 1 MG PO TABS: 1.0000 mg | ORAL_TABLET | Freq: Every day | ORAL | Status: DC

## 2012-11-24 MED ORDER — CETIRIZINE HCL 10 MG PO TABS: 10.0000 mg | ORAL_TABLET | Freq: Every day | ORAL | Status: DC | PRN

## 2012-11-24 MED ORDER — ALBUTEROL SULFATE HFA 108 (90 BASE) MCG/ACT IN AERS: 2.0000 | INHALATION_SPRAY | Freq: Two times a day (BID) | RESPIRATORY_TRACT | Status: DC | PRN

## 2012-11-24 MED ORDER — LOSARTAN POTASSIUM 100 MG PO TABS: 100.0000 mg | ORAL_TABLET | Freq: Every day | ORAL | Status: DC

## 2012-11-24 MED ORDER — HYDROCODONE-ACETAMINOPHEN 5-500 MG PO TABS: 1.0000 | ORAL_TABLET | Freq: Two times a day (BID) | ORAL | Status: DC | PRN

## 2012-11-24 MED ORDER — FLUTICASONE-SALMETEROL 250-50 MCG/DOSE IN AEPB: 1.0000 | INHALATION_SPRAY | Freq: Two times a day (BID) | RESPIRATORY_TRACT | Status: DC

## 2012-11-24 NOTE — ED Notes (Signed)
Follow up- also has been having some SOB past two days

## 2012-11-24 NOTE — ED Provider Notes (Signed)
History    CSN: [id number]  Arrival date & time 11/24/12  1106   First MD Initiated Contact with Patient 11/24/12 1124     Chief Complaint  Patient presents with  . Follow-up  . Shortness of Breath   HPI Pt was hospitalized in late November.  She was discharged on 20 days of Xarelto.  Pt says that she received a prescription to go to [facility] to get a CT angio done because they are the only facility that has equipment to accommodate her size. They could not have the tests done in the hospital to rule out PE.  Pt says that she went to [facility] and she was not able to have the test done because the prescription was not "worded correctly" and she never had the test done.  She says she went out of town and she lost her father to cancer and she has not been able to follow up until now.  It has been several months since patient was supposed to followup and she has not followed up until now.  She brought a crumpled prescription paper given to her in late November 2013.  She says that she would like to proceed to go to get the CT angio done to be sure she does not have a PE.    Past Medical History  Diagnosis Date  . Asthma   . OSA (obstructive sleep apnea)   . History of blood clots   . Morbid obesity     Past Surgical History  Procedure Date  . Cholecystectomy   . Tubal ligation   . Cesarean section     Family History  Problem Relation Age of Onset  . Asthma Mother   . Leukemia Father     History  Substance Use Topics  . Smoking status: Never Smoker   . Smokeless tobacco: Not on file  . Alcohol Use: No    OB History    Grav Para Term Preterm Abortions TAB SAB Ect Mult Living                  Review of Systems  Constitutional: Positive for fatigue.  HENT: Positive for congestion and rhinorrhea.   Respiratory: Positive for cough, chest tightness and shortness of breath.   Musculoskeletal: Positive for back pain, joint swelling and arthralgias.  All  other systems reviewed and are negative.    Allergies  Fish allergy; Amoxicillin; Other; and Strawberry  Home Medications   Current Outpatient Rx  Name  Route  Sig  Dispense  Refill  . ALBUTEROL SULFATE HFA 108 (90 BASE) MCG/ACT IN AERS   Inhalation   Inhale 2 puffs into the lungs 2 (two) times daily as needed. For shortness of breath         . CETIRIZINE HCL 10 MG PO TABS   Oral   Take 10 mg by mouth daily as needed. For allergies         . FERROUS SULFATE 325 (65 FE) MG PO TABS   Oral   Take 1 tablet (325 mg total) by mouth daily with breakfast.   90 tablet   0   . FLUTICASONE-SALMETEROL 250-50 MCG/DOSE IN AEPB   Inhalation   Inhale 1 puff into the lungs every 12 (twelve) hours.   60 each   4   . FOLIC ACID 1 MG PO TABS   Oral   Take 1 tablet (1 mg total) by mouth daily.   30 tablet  0   . HYDROCODONE-ACETAMINOPHEN 5-500 MG PO TABS   Oral   Take 1 tablet by mouth every 12 (twelve) hours as needed for pain. For pain   60 tablet   0   . LOSARTAN POTASSIUM 100 MG PO TABS   Oral   Take 1 tablet (100 mg total) by mouth daily.         Marland Kitchen RIVAROXABAN 15 MG PO TABS   Oral   Take 1 tablet (15 mg total) by mouth 2 (two) times daily.   40 tablet   0   . TRAMADOL HCL 50 MG PO TABS   Oral   Take 1 tablet (50 mg total) by mouth every 8 (eight) hours as needed for pain. For pain   90 tablet   0    BP 141/88  Pulse 95  Temp 98.6 F (37 C) (Oral)  Resp 16  SpO2 100%  Physical Exam  Nursing note and vitals reviewed. Constitutional: She is oriented to person, place, and time. She appears well-developed and well-nourished.       Morbidly obese female   HENT:  Head: Normocephalic and atraumatic.  Eyes: Conjunctivae normal and EOM are normal. Pupils are equal, round, and reactive to light.  Neck: Normal range of motion. Neck supple.  Cardiovascular: Normal rate, regular rhythm and normal heart sounds.   Pulmonary/Chest: Effort normal and breath sounds  normal. No respiratory distress. She has no wheezes. She has no rales. She exhibits no tenderness.  Abdominal: Soft. Bowel sounds are normal. She exhibits no distension.  Musculoskeletal: Normal range of motion.  Neurological: She is alert and oriented to person, place, and time.  Skin: Skin is warm and dry.  Psychiatric: She has a normal mood and affect. Her behavior is normal. Judgment and thought content normal.    ED Course  Procedures (including critical care time)   Labs Reviewed  CBC   No results found.  No diagnosis found.  MDM  IMPRESSION  History of PE   Iron Deficiency Anemia  Hypertension  History of DVT  Morbid Obesity  RECOMMENDATIONS / PLAN Check CBC, CMP Sent pt to [facility] to have her CT angiogram done to rule out PE.   FOLLOW UP 2 months   The patient was given clear instructions to go to ER or return to medical center if symptoms don't improve, worsen or new problems develop.  The patient verbalized understanding.  The patient was told to call to get lab results if they haven't heard anything in the next week.            Cleora Fleet, MD 11/24/12 [phone]

## 2012-11-25 NOTE — Progress Notes (Signed)
Quick Note:  Please notify patient that her hemoglobin is improving and her labs came back stable. REcheck labs in 4 months.    Rodney Langton, MD, CDE, FAAFP [facility] [place], [place]   ______

## 2012-11-27 ENCOUNTER — Inpatient Hospital Stay: Payer: Medicaid Other | Admitting: Pulmonary Disease

## 2012-11-27 ENCOUNTER — Telehealth (HOSPITAL_COMMUNITY): Payer: Self-pay

## 2012-11-27 NOTE — Telephone Encounter (Signed)
Message copied by Lestine Mount on Mon Nov 27, 2012  6:55 PM ------      Message from: Cleora Fleet      Created: Mon Nov 27, 2012  9:40 AM      Regarding: CT angio results : call patient        Please call patient and notify her that her CT angio did not show any large blood clots in her lungs but the radiologist says that she should have a V/Q scan done if she is still having symptoms (shortness of breath, chest pain, etc) because they were not able to get good pictures to rule out smaller blood clots that may be present and did not get picked up on the CT scan.                     Rodney Langton, MD, CDE, FAAFP      [facility]      [facility]      [place], [place]

## 2012-12-20 ENCOUNTER — Ambulatory Visit (HOSPITAL_BASED_OUTPATIENT_CLINIC_OR_DEPARTMENT_OTHER): Payer: Medicaid Other

## 2012-12-28 ENCOUNTER — Inpatient Hospital Stay: Payer: Medicaid Other | Admitting: Pulmonary Disease

## 2013-01-18 ENCOUNTER — Emergency Department (HOSPITAL_COMMUNITY)
Admission: EM | Admit: 2013-01-18 | Discharge: 2013-01-18 | Disposition: A | Discharge status: Home / Self Care | Payer: Medicaid Other | Source: Home / Self Care | Attending: Emergency Medicine | Admitting: Emergency Medicine

## 2013-01-18 ENCOUNTER — Encounter (HOSPITAL_COMMUNITY): Payer: Self-pay | Admitting: Emergency Medicine

## 2013-01-18 MED ORDER — KETOROLAC TROMETHAMINE 60 MG/2ML IM SOLN
60.0000 mg | Freq: Once | INTRAMUSCULAR | Status: AC
  Administered 2013-01-18: 60 mg via INTRAMUSCULAR

## 2013-01-18 MED ORDER — HYDROCODONE-ACETAMINOPHEN 5-325 MG PO TABS: ORAL_TABLET | ORAL | Status: DC

## 2013-01-18 MED ORDER — KETOROLAC TROMETHAMINE 60 MG/2ML IM SOLN
INTRAMUSCULAR | Status: AC
  Filled 2013-01-18: qty 2

## 2013-01-18 MED ORDER — METHOCARBAMOL 500 MG PO TABS: 500.0000 mg | ORAL_TABLET | Freq: Three times a day (TID) | ORAL | Status: DC

## 2013-01-18 MED ORDER — METHYLPREDNISOLONE ACETATE 80 MG/ML IJ SUSP
80.0000 mg | Freq: Once | INTRAMUSCULAR | Status: AC
  Administered 2013-01-18: 80 mg via INTRAMUSCULAR

## 2013-01-18 MED ORDER — METHYLPREDNISOLONE ACETATE 80 MG/ML IJ SUSP
INTRAMUSCULAR | Status: AC
  Filled 2013-01-18: qty 1

## 2013-01-18 NOTE — ED Notes (Signed)
patient complains of chronic back pain.  Patient reports over the past 2 weeks pain has increased in back.  Pain is now radiating up back to middle back. Patient reports she is participating in a walking routine over the past month.
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
## 2013-02-02 ENCOUNTER — Emergency Department (HOSPITAL_COMMUNITY)
Admission: EM | Admit: 2013-02-02 | Discharge: 2013-02-02 | Disposition: A | Discharge status: Home / Self Care | Payer: Medicaid Other | Source: Home / Self Care

## 2013-02-02 ENCOUNTER — Emergency Department (INDEPENDENT_AMBULATORY_CARE_PROVIDER_SITE_OTHER): Payer: Medicaid Other

## 2013-02-02 ENCOUNTER — Encounter (HOSPITAL_COMMUNITY): Payer: Self-pay

## 2013-02-02 DIAGNOSIS — M25552 Pain in left hip: Secondary | ICD-10-CM

## 2013-02-02 DIAGNOSIS — M25559 Pain in unspecified hip: Secondary | ICD-10-CM

## 2013-02-02 DIAGNOSIS — IMO0002 Reserved for concepts with insufficient information to code with codable children: Secondary | ICD-10-CM

## 2013-02-02 DIAGNOSIS — D509 Iron deficiency anemia, unspecified: Secondary | ICD-10-CM

## 2013-02-02 DIAGNOSIS — G8929 Other chronic pain: Secondary | ICD-10-CM

## 2013-02-02 DIAGNOSIS — M549 Dorsalgia, unspecified: Secondary | ICD-10-CM

## 2013-02-02 DIAGNOSIS — I2699 Other pulmonary embolism without acute cor pulmonale: Secondary | ICD-10-CM

## 2013-02-02 MED ORDER — FLUTICASONE-SALMETEROL 250-50 MCG/DOSE IN AEPB: 1.0000 | INHALATION_SPRAY | Freq: Two times a day (BID) | RESPIRATORY_TRACT | Status: DC

## 2013-02-02 MED ORDER — ESCITALOPRAM OXALATE 10 MG PO TABS: 10.0000 mg | ORAL_TABLET | Freq: Every day | ORAL | Status: DC

## 2013-02-02 MED ORDER — METHOCARBAMOL 500 MG PO TABS: 500.0000 mg | ORAL_TABLET | Freq: Three times a day (TID) | ORAL | Status: DC

## 2013-02-02 MED ORDER — RIVAROXABAN 20 MG PO TABS: 20.0000 mg | ORAL_TABLET | Freq: Every day | ORAL | Status: DC

## 2013-02-02 MED ORDER — LOSARTAN POTASSIUM 100 MG PO TABS: 100.0000 mg | ORAL_TABLET | Freq: Every day | ORAL | Status: AC

## 2013-02-02 MED ORDER — RIVAROXABAN 15 MG PO TABS: 15.0000 mg | ORAL_TABLET | Freq: Two times a day (BID) | ORAL | Status: DC

## 2013-02-02 MED ORDER — TRAMADOL HCL 50 MG PO TABS: 50.0000 mg | ORAL_TABLET | Freq: Three times a day (TID) | ORAL | Status: DC | PRN

## 2013-02-02 MED ORDER — ALBUTEROL SULFATE HFA 108 (90 BASE) MCG/ACT IN AERS: 2.0000 | INHALATION_SPRAY | Freq: Four times a day (QID) | RESPIRATORY_TRACT | Status: DC | PRN

## 2013-02-02 MED ORDER — CETIRIZINE HCL 10 MG PO TABS: 10.0000 mg | ORAL_TABLET | Freq: Every day | ORAL | Status: DC | PRN

## 2013-02-02 MED ORDER — FERROUS SULFATE 325 (65 FE) MG PO TABS: 325.0000 mg | ORAL_TABLET | Freq: Every day | ORAL | Status: DC

## 2013-02-02 MED ORDER — FOLIC ACID 1 MG PO TABS: 1.0000 mg | ORAL_TABLET | Freq: Every day | ORAL | Status: DC

## 2013-02-02 MED ORDER — HYDROCODONE-ACETAMINOPHEN 5-325 MG PO TABS: ORAL_TABLET | ORAL | Status: DC

## 2013-02-02 NOTE — ED Notes (Signed)
Hospital follow up- History of anemia, blood clots and back pain

## 2013-02-02 NOTE — ED Notes (Signed)
Patient Demographics  [name], is a 37 y.o. female  ZOX:[id number]  WJX:[id number]  DOB - Apr 17, 1976  Chief Complaint  Patient presents with  . Follow-up        Subjective:   [name] morbid obesity, recent history of PE few months ago on xaralto, chronic back pain, left hip and shoulder pain, claims to have left shoulder rotator cuff injury diagnosed several years ago, here to get a prescription medications refilled, no new complaints. No chest abdominal pain no shortness of breath. Left hip pain is worse when she bears weight and walks, better with rest, no fever chills tingling numbness. No weakness in left leg.  Objective:   Past Medical History  Diagnosis Date  . Asthma   . OSA (obstructive sleep apnea)   . History of blood clots   . Morbid obesity       Past Surgical History  Procedure Laterality Date  . Cholecystectomy    . Tubal ligation    . Cesarean section       Filed Vitals:   02/02/13 1541  BP: 141/77  Pulse: 114  Temp: 97.8 F (36.6 C)  TempSrc: Oral  Resp: 17  SpO2: 97%     Exam  Awake Alert, Oriented X 3, No new F.N deficits, Normal affect [place].AT,PERRAL Supple Neck,No JVD, No cervical lymphadenopathy appriciated.  Symmetrical Chest wall movement, Good air movement bilaterally, CTAB RRR,No Gallops,Rubs or new Murmurs, No Parasternal Heave +ve B.Sounds, Abd Soft, Non tender, No organomegaly appriciated, No rebound - guarding or rigidity. No Cyanosis, Clubbing or edema, No new Rash or bruise  stream limited exam due to morbid obesity. However no signs of active infection left hip or left shoulder.    Data Review   CBC No results found for this basename: WBC, HGB, HCT, PLT, MCV, MCH, MCHC, RDW, NEUTRABS, LYMPHSABS, MONOABS, EOSABS, BASOSABS, BANDABS, BANDSABD,  in the last 168 hours  Chemistries   No results found for this basename: NA, K, CL, CO2, GLUCOSE, BUN, CREATININE, GFRCGP, CALCIUM, MG, AST, ALT, ALKPHOS, BILITOT,  in the last  168 hours ------------------------------------------------------------------------------------------------------------------ No results found for this basename: HGBA1C,  in the last 72 hours ------------------------------------------------------------------------------------------------------------------ No results found for this basename: CHOL, HDL, LDLCALC, TRIG, CHOLHDL, LDLDIRECT,  in the last 72 hours ------------------------------------------------------------------------------------------------------------------ No results found for this basename: TSH, T4TOTAL, FREET3, T3FREE, THYROIDAB,  in the last 72 hours ------------------------------------------------------------------------------------------------------------------ No results found for this basename: VITAMINB12, FOLATE, FERRITIN, TIBC, IRON, RETICCTPCT,  in the last 72 hours  Coagulation profile  No results found for this basename: INR, PROTIME,  in the last 168 hours     Prior to Admission medications   Medication Sig Start Date End Date Taking? Authorizing Provider  albuterol (PROVENTIL HFA;VENTOLIN HFA) 108 (90 BASE) MCG/ACT inhaler Inhale 2 puffs into the lungs every 6 (six) hours as needed for wheezing or shortness of breath. For shortness of breath 02/02/13   Leroy Sea, MD  cetirizine (ZYRTEC) 10 MG tablet Take 1 tablet (10 mg total) by mouth daily as needed. For allergies 02/02/13   Leroy Sea, MD  escitalopram (LEXAPRO) 10 MG tablet Take 1 tablet (10 mg total) by mouth daily. 02/02/13   Leroy Sea, MD  ferrous sulfate 325 (65 FE) MG tablet Take 1 tablet (325 mg total) by mouth daily with breakfast. 02/02/13   Leroy Sea, MD  Fluticasone-Salmeterol (ADVAIR) 250-50 MCG/DOSE AEPB Inhale 1 puff into the lungs every 12 (twelve) hours. 02/02/13   Adalid Beckmann K  Thedore Mins, MD  folic acid (FOLVITE) 1 MG tablet Take 1 tablet (1 mg total) by mouth daily. 02/02/13   Leroy Sea, MD  HYDROcodone-acetaminophen  (NORCO/VICODIN) 5-325 MG per tablet 1 to 2 tabs every 4 to 6 hours as needed for pain. 02/02/13   Leroy Sea, MD  losartan (COZAAR) 100 MG tablet Take 1 tablet (100 mg total) by mouth daily. 02/02/13   Leroy Sea, MD  methocarbamol (ROBAXIN) 500 MG tablet Take 1 tablet (500 mg total) by mouth 3 (three) times daily. 02/02/13   Leroy Sea, MD  Rivaroxaban (XARELTO) 20 MG TABS Take 1 tablet (20 mg total) by mouth daily. 02/02/13   Leroy Sea, MD  traMADol (ULTRAM) 50 MG tablet Take 1 tablet (50 mg total) by mouth every 8 (eight) hours as needed for pain. For pain 02/02/13   Leroy Sea, MD     Assessment & Plan   Chronic left hip and left shoulder pain. 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 Left hip x-rays obtained, pain medications refilled, she has been referred to orthopedic physician Dr. Victorino Dike.  History of PE few months ago. Patient has been placed on xaralto 20 mg once a day dose discussed with inpatient pharmacist.  History of asthma and allergy medications and inhaler refilled.  Morbid obesity counseled on diet control and regular exercise, he claims to have lost between 70-90 pounds of weight. Encouraged to continue doing so.  Follow-up Information   Schedule an appointment as soon as possible for a visit in 1 month to follow up.      Follow up with HEWITT, Jonny Ruiz, MD. Schedule an appointment as soon as possible for a visit in 1 week. (Peripheral for left hip and left shoulder pain.)    Contact information:   [address] [place] [address] [phone]       Follow up with Levert Feinstein, MD. Schedule an appointment as soon as possible for a visit in 1 month. (PE treatment, duration of treatment)    Contact information:   [address] [place] [address] [phone]         Leroy Sea M.D on 02/02/2013 at 4:08 PM   Leroy Sea, MD 02/02/13 [phone]

## 2013-09-04 ENCOUNTER — Encounter (HOSPITAL_COMMUNITY): Payer: Self-pay | Admitting: Emergency Medicine

## 2013-09-04 ENCOUNTER — Emergency Department (HOSPITAL_COMMUNITY)
Admission: EM | Admit: 2013-09-04 | Discharge: 2013-09-04 | Disposition: A | Discharge status: Home / Self Care | Payer: Medicaid Other | Source: Home / Self Care | Attending: Family Medicine | Admitting: Family Medicine

## 2013-09-04 DIAGNOSIS — J0100 Acute maxillary sinusitis, unspecified: Secondary | ICD-10-CM

## 2013-09-04 MED ORDER — ALBUTEROL SULFATE (5 MG/ML) 0.5% IN NEBU
5.0000 mg | INHALATION_SOLUTION | Freq: Once | RESPIRATORY_TRACT | Status: AC
  Administered 2013-09-04: 5 mg via RESPIRATORY_TRACT

## 2013-09-04 MED ORDER — IPRATROPIUM BROMIDE 0.02 % IN SOLN
0.5000 mg | Freq: Once | RESPIRATORY_TRACT | Status: AC
  Administered 2013-09-04: 0.5 mg via RESPIRATORY_TRACT

## 2013-09-04 MED ORDER — IPRATROPIUM BROMIDE 0.02 % IN SOLN
RESPIRATORY_TRACT | Status: AC
  Filled 2013-09-04: qty 2.5

## 2013-09-04 MED ORDER — METHYLPREDNISOLONE SODIUM SUCC 125 MG IJ SOLR
125.0000 mg | Freq: Once | INTRAMUSCULAR | Status: AC
  Administered 2013-09-04: 125 mg via INTRAMUSCULAR

## 2013-09-04 MED ORDER — METHYLPREDNISOLONE SODIUM SUCC 125 MG IJ SOLR
INTRAMUSCULAR | Status: AC
  Filled 2013-09-04: qty 2

## 2013-09-04 MED ORDER — MINOCYCLINE HCL 100 MG PO CAPS: 100.0000 mg | ORAL_CAPSULE | Freq: Two times a day (BID) | ORAL | Status: DC

## 2013-09-04 MED ORDER — ALBUTEROL SULFATE (5 MG/ML) 0.5% IN NEBU
INHALATION_SOLUTION | RESPIRATORY_TRACT | Status: AC
  Filled 2013-09-04: qty 1

## 2013-09-04 MED ORDER — IPRATROPIUM BROMIDE 0.06 % NA SOLN: 2.0000 | Freq: Four times a day (QID) | NASAL | Status: DC

## 2013-09-04 NOTE — ED Notes (Signed)
Sore throat, cough, right ear pain, green phlegm, chest soreness, back soreness, and wheezing

## 2013-09-04 NOTE — ED Provider Notes (Addendum)
CSN: [id number]     Arrival date & time 09/04/13  1229 History   None    Chief Complaint  Patient presents with  . URI   (Consider location/radiation/quality/duration/timing/severity/associated sxs/prior Treatment) Patient is a 37 y.o. female presenting with URI. The history is provided by the patient.  URI Presenting symptoms: congestion, cough and rhinorrhea   Presenting symptoms: no fever   Severity:  Mild Onset quality:  Gradual Duration:  3 days Progression:  Unchanged Chronicity:  New Associated symptoms: wheezing   Risk factors: chronic respiratory disease     Past Medical History  Diagnosis Date  . Asthma   . OSA (obstructive sleep apnea)   . History of blood clots   . Morbid obesity    Past Surgical History  Procedure Laterality Date  . Cholecystectomy    . Tubal ligation    . Cesarean section     Family History  Problem Relation Age of Onset  . Asthma Mother   . Leukemia Father    History  Substance Use Topics  . Smoking status: Never Smoker   . Smokeless tobacco: Not on file  . Alcohol Use: No   OB History   Grav Para Term Preterm Abortions TAB SAB Ect Mult Living                 Review of Systems  Constitutional: Negative.  Negative for fever.  HENT: Positive for congestion, postnasal drip and rhinorrhea.   Respiratory: Positive for cough and wheezing.   Cardiovascular: Negative.   Gastrointestinal: Negative.   Genitourinary: Negative.     Allergies  Fish allergy; Amoxicillin; Other; and Strawberry  Home Medications   Current Outpatient Rx  Name  Route  Sig  Dispense  Refill  . albuterol (PROVENTIL HFA;VENTOLIN HFA) 108 (90 BASE) MCG/ACT inhaler   Inhalation   Inhale 2 puffs into the lungs every 6 (six) hours as needed for wheezing or shortness of breath. For shortness of breath   1 Inhaler   5   . Fluticasone-Salmeterol (ADVAIR) 250-50 MCG/DOSE AEPB   Inhalation   Inhale 1 puff into the lungs every 12 (twelve) hours.   60 each  3   . cetirizine (ZYRTEC) 10 MG tablet   Oral   Take 1 tablet (10 mg total) by mouth daily as needed. For allergies   30 tablet   3   . escitalopram (LEXAPRO) 10 MG tablet   Oral   Take 1 tablet (10 mg total) by mouth daily.   30 tablet   3   . ferrous sulfate 325 (65 FE) MG tablet   Oral   Take 1 tablet (325 mg total) by mouth daily with breakfast.   90 tablet   3   . folic acid (FOLVITE) 1 MG tablet   Oral   Take 1 tablet (1 mg total) by mouth daily.   30 tablet   3   . HYDROcodone-acetaminophen (NORCO/VICODIN) 5-325 MG per tablet      1 to 2 tabs every 4 to 6 hours as needed for pain.   30 tablet   0   . ipratropium (ATROVENT) 0.06 % nasal spray   Nasal   Place 2 sprays into the nose 4 (four) times daily.   15 mL   1   . losartan (COZAAR) 100 MG tablet   Oral   Take 1 tablet (100 mg total) by mouth daily.   30 tablet   3   . methocarbamol (ROBAXIN) 500 MG  tablet   Oral   Take 1 tablet (500 mg total) by mouth 3 (three) times daily.   30 tablet   3   . minocycline (MINOCIN,DYNACIN) 100 MG capsule   Oral   Take 1 capsule (100 mg total) by mouth 2 (two) times daily.   20 capsule   0   . Rivaroxaban (XARELTO) 20 MG TABS   Oral   Take 1 tablet (20 mg total) by mouth daily.   30 tablet   3   . traMADol (ULTRAM) 50 MG tablet   Oral   Take 1 tablet (50 mg total) by mouth every 8 (eight) hours as needed for pain. For pain   90 tablet   0    BP 154/92  Pulse 105  Temp(Src) 98.3 F (36.8 C) (Oral)  Resp 16  SpO2 99%  LMP 08/04/2013 Physical Exam  Nursing note and vitals reviewed. Constitutional: She is oriented to person, place, and time. She appears well-developed and well-nourished.  HENT:  Head: Normocephalic.  Right Ear: External ear normal.  Left Ear: External ear normal.  Mouth/Throat: Oropharynx is clear and moist.  Eyes: Conjunctivae are normal. Pupils are equal, round, and reactive to light.  Neck: Normal range of motion. Neck  supple.  Cardiovascular: Normal rate, normal heart sounds and intact distal pulses.   Pulmonary/Chest: Effort normal. She has wheezes.  Neurological: She is alert and oriented to person, place, and time.  Skin: Skin is warm and dry.    ED Course  Procedures (including critical care time) Labs Review Labs Reviewed - No data to display Imaging Review No results found.    MDM      Linna Hoff, MD 09/04/13 1526  Linna Hoff, MD 09/04/13 [phone]

## 2013-11-15 ENCOUNTER — Emergency Department (HOSPITAL_COMMUNITY)
Admission: EM | Admit: 2013-11-15 | Discharge: 2013-11-15 | Disposition: A | Discharge status: Home / Self Care | Payer: Medicaid Other | Source: Home / Self Care

## 2013-12-20 ENCOUNTER — Encounter: Payer: Self-pay | Admitting: Neurology

## 2013-12-24 ENCOUNTER — Encounter: Payer: Self-pay | Admitting: Neurology

## 2013-12-24 ENCOUNTER — Encounter (INDEPENDENT_AMBULATORY_CARE_PROVIDER_SITE_OTHER): Payer: Self-pay

## 2013-12-24 ENCOUNTER — Ambulatory Visit (INDEPENDENT_AMBULATORY_CARE_PROVIDER_SITE_OTHER): Payer: Medicaid Other | Admitting: Neurology

## 2013-12-24 VITALS — BP 137/89 | HR 98 | Ht 68.5 in

## 2013-12-24 DIAGNOSIS — G4733 Obstructive sleep apnea (adult) (pediatric): Secondary | ICD-10-CM

## 2013-12-24 DIAGNOSIS — E662 Morbid (severe) obesity with alveolar hypoventilation: Secondary | ICD-10-CM | POA: Insufficient documentation

## 2013-12-24 NOTE — Patient Instructions (Signed)
Sleep Apnea  Sleep apnea is disorder that affects a person's sleep. A person with sleep apnea has abnormal pauses in their breathing when they sleep. It is hard for them to get a good sleep. This makes a person tired during the day. It also can lead to other physical problems. There are three types of sleep apnea. One type is when breathing stops for a short time because your airway is blocked (obstructive sleep apnea). Another type is when the brain sometimes fails to give the normal signal to breathe to the muscles that control your breathing (central sleep apnea). The third type is a combination of the other two types.  HOME CARE  · Do not sleep on your back. Try to sleep on your side.  · Take all medicine as told by your doctor.  · Avoid alcohol, calming medicines (sedatives), and depressant drugs.  · Try to lose weight if you are overweight. Talk to your doctor about a healthy weight goal.  Your doctor may have you use a device that helps to open your airway. It can help you get the air that you need. It is called a positive airway pressure (PAP) device. There are three types of PAP devices:  · Continuous positive airway pressure (CPAP) device.  · Nasal expiratory positive airway pressure (EPAP) device.  · Bilevel positive airway pressure (BPAP) device.  MAKE SURE YOU:  · Understand these instructions.  · Will watch your condition.  · Will get help right away if you are not doing well or get worse.  Document Released: 07/27/2008 Document Revised: 10/04/2012 Document Reviewed: 02/19/2012  ExitCare® Patient Information ©2014 [employer].

## 2013-12-24 NOTE — Assessment & Plan Note (Signed)
Untreated OSA.
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
## 2014-01-07 ENCOUNTER — Ambulatory Visit (INDEPENDENT_AMBULATORY_CARE_PROVIDER_SITE_OTHER): Payer: Medicaid Other | Admitting: Neurology

## 2014-01-07 DIAGNOSIS — G4730 Sleep apnea, unspecified: Secondary | ICD-10-CM

## 2014-01-07 DIAGNOSIS — G479 Sleep disorder, unspecified: Secondary | ICD-10-CM

## 2014-01-07 DIAGNOSIS — G4710 Hypersomnia, unspecified: Secondary | ICD-10-CM

## 2014-01-07 DIAGNOSIS — G4733 Obstructive sleep apnea (adult) (pediatric): Secondary | ICD-10-CM

## 2014-01-16 NOTE — Sleep Study (Signed)
See media tab for full report  

## 2014-01-18 ENCOUNTER — Telehealth: Payer: Self-pay | Admitting: Neurology

## 2014-01-18 DIAGNOSIS — G4730 Sleep apnea, unspecified: Secondary | ICD-10-CM

## 2014-01-18 DIAGNOSIS — G4733 Obstructive sleep apnea (adult) (pediatric): Secondary | ICD-10-CM

## 2014-01-18 DIAGNOSIS — G4710 Hypersomnia, unspecified: Secondary | ICD-10-CM

## 2014-01-18 NOTE — Telephone Encounter (Signed)
Dr. Edwena Felty patient:   Please call and notify patient that the recent sleep study confirmed the diagnosis of OSA. She did well with CPAP during the study with significant improvement of the respiratory events. Therefore, I would like start the patient on CPAP at home. I placed the order in the chart for Dr. Brett Fairy.   Arrange for CPAP set up at home through a DME company of patient's choice and fax/route report to PCP and referring MD (if other than PCP).   The patient will also need a follow up appointment with Dr. Brett Fairy in 6-8 weeks post set up that has to be scheduled.   Please re-enforce the importance of compliance with treatment and the need for [place] to monitor compliance data.   Once you have spoken to the patient and scheduled the return appointment, you may close this encounter, thanks,   Star Age, MD, PhD [facility] ([place])

## 2014-01-21 ENCOUNTER — Encounter: Payer: Self-pay | Admitting: *Deleted

## 2014-01-21 NOTE — Telephone Encounter (Signed)
I called and spoke with the patient about her recent sleep study results. I informed the patient that the study confirmed the diagnosis of obstructive sleep apnea and that she did well on CPAP during the night of her study with significant improvement of the respiratory events.  Patient understood and would like for her order to be sent to [facility]. I will fax a copy of the report Dr. Carmell Austria Roberson's office and mail a copy to the patient with a follow up instruction letter.

## 2014-05-12 ENCOUNTER — Encounter (HOSPITAL_COMMUNITY): Payer: Self-pay | Admitting: Emergency Medicine

## 2014-05-12 ENCOUNTER — Emergency Department (INDEPENDENT_AMBULATORY_CARE_PROVIDER_SITE_OTHER)
Admission: EM | Admit: 2014-05-12 | Discharge: 2014-05-12 | Disposition: A | Discharge status: Home / Self Care | Payer: Medicaid Other | Source: Home / Self Care | Attending: Family Medicine | Admitting: Family Medicine

## 2014-05-12 DIAGNOSIS — Z889 Allergy status to unspecified drugs, medicaments and biological substances status: Secondary | ICD-10-CM

## 2014-05-12 DIAGNOSIS — R21 Rash and other nonspecific skin eruption: Secondary | ICD-10-CM

## 2014-05-12 DIAGNOSIS — L509 Urticaria, unspecified: Secondary | ICD-10-CM

## 2014-05-12 MED ORDER — PREGABALIN 150 MG PO CAPS: 150.0000 mg | ORAL_CAPSULE | Freq: Every day | ORAL | Status: DC

## 2014-05-12 MED ORDER — RANITIDINE HCL 150 MG PO CAPS: 150.0000 mg | ORAL_CAPSULE | Freq: Two times a day (BID) | ORAL | Status: DC

## 2014-05-12 NOTE — ED Provider Notes (Signed)
CSN: [id number]     Arrival date & time 05/12/14  1250 History   First MD Initiated Contact with Patient 05/12/14 1328     Chief Complaint  Patient presents with  . Rash   (Consider location/radiation/quality/duration/timing/severity/associated sxs/prior Treatment) HPI Comments: 38 year old female presents for evaluation of a rash. She has had an itchy red rash for about 2 weeks that started on her chest and has since spread to her abdomen and back. It is itchy, raised, red, and has a swollen appearance. She notes that she was recently started on metformin for diabetes one month ago, as well as gabapentin for sciatica. She also has some new sheets, however she took those off her bed and the rash continues to worsen. She takes Zyrtec for allergies already. She does not recall her last hemoglobin A1c level. She does not check her blood sugar regularly and doesn't know how it has been. She denies any shortness of breath or wheezing, or upset stomach [place]. No dizziness or lightheadedness.   Past Medical History  Diagnosis Date  . Asthma   . OSA (obstructive sleep apnea)   . History of blood clots   . Morbid obesity   . Allergic rhinitis   . Anemia   . Anxiety disorder   . Benign essential hypertension   . Depression   . Edema   . Hyperglycemia   . Lumbar radiculopathy   . Metrorrhagia    Past Surgical History  Procedure Laterality Date  . Cholecystectomy    . Tubal ligation    . Cesarean section     Family History  Problem Relation Age of Onset  . Asthma Mother   . Leukemia Father   . Breast cancer Maternal Aunt   . Breast cancer Paternal Aunt   . Diabetes Father   . Diabetes Brother   . Hypertension Mother    History  Substance Use Topics  . Smoking status: Never Smoker   . Smokeless tobacco: Never Used  . Alcohol Use: No   OB History   Grav Para Term Preterm Abortions TAB SAB Ect Mult Living                 Review of Systems  Skin: Positive for rash.  All  other systems reviewed and are negative.   Allergies  Fish allergy; Amoxicillin; Other; Strawberry; and Celexa  Home Medications   Prior to Admission medications   Medication Sig Start Date End Date Taking? Authorizing Provider  albuterol (PROVENTIL HFA;VENTOLIN HFA) 108 (90 BASE) MCG/ACT inhaler Inhale 2 puffs into the lungs every 6 (six) hours as needed for wheezing or shortness of breath. For shortness of breath 02/02/13   Thurnell Lose, MD  Albuterol Sulfate (PROAIR HFA IN) Inhale into the lungs. 1-2 puffs every 4-6 hours as needed.    Historical Provider, MD  cetirizine (ZYRTEC) 10 MG tablet Take 1 tablet (10 mg total) by mouth daily as needed. For allergies 02/02/13   Thurnell Lose, MD  diclofenac (VOLTAREN) 75 MG EC tablet Take 75 mg by mouth 2 (two) times daily.    Historical Provider, MD  ferrous sulfate 325 (65 FE) MG tablet Take 1 tablet (325 mg total) by mouth daily with breakfast. 02/02/13   Thurnell Lose, MD  fluticasone (FLONASE) 50 MCG/ACT nasal spray Place 2 sprays into both nostrils daily.    Historical Provider, MD  Fluticasone-Salmeterol (ADVAIR) 500-50 MCG/DOSE AEPB Inhale 1 puff into the lungs 2 (two) times daily.  Historical Provider, MD  folic acid (FOLVITE) 1 MG tablet Take 1 tablet (1 mg total) by mouth daily. 02/02/13   Thurnell Lose, MD  ipratropium (ATROVENT) 0.06 % nasal spray Place 2 sprays into the nose 4 (four) times daily. 09/04/13   Billy Fischer, MD  losartan (COZAAR) 100 MG tablet Take 1 tablet (100 mg total) by mouth daily. 02/02/13   Thurnell Lose, MD  pregabalin (LYRICA) 150 MG capsule Take 1 capsule (150 mg total) by mouth at bedtime. 05/12/14   Liam Graham, PA-C  ranitidine (ZANTAC) 150 MG capsule Take 1 capsule (150 mg total) by mouth 2 (two) times daily. 05/12/14   Freeman Caldron Fitzhugh Vizcarrondo, PA-C   BP 121/86  Pulse 106  Temp(Src) 98.1 F (36.7 C) (Oral)  Resp 18  SpO2 100% Physical Exam  Nursing note and vitals reviewed. Constitutional: She is  oriented to person, place, and time. Vital signs are normal. She appears well-developed and well-nourished. No distress.  Morbidly obese body habitus  HENT:  Head: Normocephalic and atraumatic.  Pulmonary/Chest: Effort normal. No respiratory distress.  Neurological: She is alert and oriented to person, place, and time. She has normal strength. Coordination normal.  Skin: Skin is warm and dry. Rash noted. Rash is urticarial (Fine urticarial rash on the chest, abdomen, and back). She is not diaphoretic.  Psychiatric: She has a normal mood and affect. Judgment normal.    ED Course  Procedures (including critical care time) Labs Review Labs Reviewed - No data to display  Imaging Review No results found.   MDM   1. Rash   2. Urticaria   3. Drug allergy    Probable drug reaction. No signs of anaphylaxis. Skip tonight's dose of metformin and call her doctor in the morning. Stopped gabapentin, switch to Lyrica. Treat the rash with increasing the dose of Zyrtec twice daily and adding 150 mg Zantac twice daily. Followup with PCP ASAP   Meds ordered this encounter  Medications  . ranitidine (ZANTAC) 150 MG capsule    Sig: Take 1 capsule (150 mg total) by mouth 2 (two) times daily.    Dispense:  30 capsule    Refill:  0    Order Specific Question:  Supervising Provider    Answer:  Jake Michaelis, DAVID C [id number]  . pregabalin (LYRICA) 150 MG capsule    Sig: Take 1 capsule (150 mg total) by mouth at bedtime.    Dispense:  7 capsule    Refill:  0    Order Specific Question:  Supervising Provider    Answer:  Jake Michaelis, DAVID C [[id number]]       Liam Graham, PA-C 05/12/14 1401

## 2014-05-12 NOTE — ED Notes (Signed)
Patient c/o rash/hives starting on left side of her chest x 1 week. Rash continues to spread all over chest, stomach, and neck. Patient reports she has been feeling SOB and had to use inhaler. Pt reports she just started two new medications   3 Weeks ago. Unsure if this is a dug allergy.

## 2014-05-12 NOTE — ED Provider Notes (Signed)
Medical screening examination/treatment/procedure(s) were performed by resident physician or non-physician practitioner and as supervising physician I was immediately available for consultation/collaboration.   Pauline Good MD.   Billy Fischer, MD 05/12/14 [phone]

## 2014-05-12 NOTE — Discharge Instructions (Signed)
Drug Allergy °Allergic reactions to medicines are common. Some allergic reactions are mild. A delayed type of drug allergy that occurs 1 week or more after exposure to a medicine or vaccine is called serum sickness. A life-threatening, sudden (acute) allergic reaction that involves the whole body is called anaphylaxis. °CAUSES  °"True" drug allergies occur when there is an allergic reaction to a medicine. This is caused by overactivity of the immune system. First, the body becomes sensitized. The immune system is triggered by your first exposure to the medicine. Following this first exposure, future exposure to the same medicine may be life-threatening. °Almost any medicine can cause an allergic reaction. Common ones are: °· Penicillin. °· Sulfonamides (sulfa drugs). °· Local anesthetics. °· X-ray dyes that contain iodine. °SYMPTOMS  °Common symptoms of a minor allergic reaction are: °· Swelling around the mouth. °· An itchy red rash or hives. °· Vomiting or diarrhea. °Anaphylaxis can cause swelling of the mouth and throat. This makes it difficult to breathe and swallow. Severe reactions can be fatal within seconds, even after exposure to only a trace amount of the drug that causes the reaction. °HOME CARE INSTRUCTIONS  °· If you are unsure of what caused your reaction, keep a diary of foods and medicines used. Include the symptoms that followed. Avoid anything that causes reactions. °· You may want to follow up with an allergy specialist after the reaction has cleared in order to be tested to confirm the allergy. It is important to confirm that your reaction is an allergy, not just a side effect to the medicine. If you have a true allergy to a medicine, this may prevent that medicine and related medicines from being given to you when you are very ill. °· If you have hives or a rash: °¨ Take medicines as directed by your caregiver. °¨ You may use an over-the-counter antihistamine (diphenhydramine) as  needed. °¨ Apply cold compresses to the skin or take baths in cool water. Avoid hot baths or showers. °· If you are severely allergic: °¨ Continuous observation after a severe reaction may be needed. Hospitalization is often required. °¨ Wear a medical alert bracelet or necklace stating your allergy. °¨ You and your family must learn how to use an anaphylaxis kit or give an epinephrine injection to temporarily treat an emergency allergic reaction. If you have had a severe reaction, always carry your epinephrine injection or anaphylaxis kit with you. This can be lifesaving if you have a severe reaction. °· Do not drive or perform tasks after treatment until the medicines used to treat your reaction have worn off, or until your caregiver says it is okay. °SEEK MEDICAL CARE IF:  °· You think you had an allergic reaction. Symptoms usually start within 30 minutes after exposure. °· Symptoms are getting worse rather than better. °· You develop new symptoms. °· The symptoms that brought you to your caregiver return. °SEEK IMMEDIATE MEDICAL CARE IF:  °· You have swelling of the mouth, difficulty breathing, or wheezing. °· You have a tight feeling in your chest or throat. °· You develop hives, swelling, or itching all over your body. °· You develop severe vomiting or diarrhea. °· You feel faint or pass out. °This is an emergency. Use your epinephrine injection or anaphylaxis kit as you have been instructed. Call for emergency medical help. Even if you improve after the injection, you need to be examined at a hospital emergency department. °MAKE SURE YOU:  °· Understand these instructions. °· Will watch   your condition.  Will get help right away if you are not doing well or get worse. Document Released: 10/18/2005 Document Revised: 01/10/2012 Document Reviewed: 03/24/2011 [facility] Patient Information 2015 [place], [place]. This information is not intended to replace advice given to you by your health care provider. Make  sure you discuss any questions you have with your health care provider.  Hives Hives are itchy, red, swollen areas of the skin. They can vary in size and location on your body. Hives can come and go for hours or several days (acute hives) or for several weeks (chronic hives). Hives do not spread from person to person (noncontagious). They may get worse with scratching, exercise, and emotional stress. CAUSES   Allergic reaction to food, additives, or drugs.  Infections, including the common cold.  Illness, such as vasculitis, lupus, or thyroid disease.  Exposure to sunlight, heat, or cold.  Exercise.  Stress.  Contact with chemicals. SYMPTOMS   Red or white swollen patches on the skin. The patches may change size, shape, and location quickly and repeatedly.  Itching.  Swelling of the hands, feet, and face. This may occur if hives develop deeper in the skin. DIAGNOSIS  Your caregiver can usually tell what is wrong by performing a physical exam. Skin or blood tests may also be done to determine the cause of your hives. In some cases, the cause cannot be determined. TREATMENT  Mild cases usually get better with medicines such as antihistamines. Severe cases may require an emergency epinephrine injection. If the cause of your hives is known, treatment includes avoiding that trigger.  HOME CARE INSTRUCTIONS   Avoid causes that trigger your hives.  Take antihistamines as directed by your caregiver to reduce the severity of your hives. Non-sedating or low-sedating antihistamines are usually recommended. Do not drive while taking an antihistamine.  Take any other medicines prescribed for itching as directed by your caregiver.  Wear loose-fitting clothing.  Keep all follow-up appointments as directed by your caregiver. SEEK MEDICAL CARE IF:   You have persistent or severe itching that is not relieved with medicine.  You have painful or swollen joints. SEEK IMMEDIATE MEDICAL CARE  IF:   You have a fever.  Your tongue or lips are swollen.  You have trouble breathing or swallowing.  You feel tightness in the throat or chest.  You have abdominal pain. These problems may be the first sign of a life-threatening allergic reaction. Call your local emergency services (911 in [place] MAKE SURE YOU:   Understand these instructions.  Will watch your condition.  Will get help right away if you are not doing well or get worse. Document Released: 10/18/2005 Document Revised: 10/23/2013 Document Reviewed: 01/11/2012 [facility] Patient Information 2015 [place], [place]. This information is not intended to replace advice given to you by your health care provider. Make sure you discuss any questions you have with your health care provider.

## 2014-08-16 ENCOUNTER — Other Ambulatory Visit: Payer: Self-pay

## 2015-04-14 ENCOUNTER — Other Ambulatory Visit: Payer: Self-pay | Admitting: *Deleted

## 2015-04-14 DIAGNOSIS — N938 Other specified abnormal uterine and vaginal bleeding: Secondary | ICD-10-CM

## 2015-04-15 ENCOUNTER — Telehealth: Payer: Self-pay

## 2015-04-15 NOTE — Telephone Encounter (Signed)
Patient referred from [facility] for menorrhagia. Per Dr. Ihor Dow, [place] to schedule visit and patient should have U/S prior to visit. U/S scheduled for 05/25/15 at 0800. Attempted to contact patient. Not accepting calls at this time. Will send letter with both U/S and clinic appointment.

## 2015-04-16 ENCOUNTER — Encounter: Payer: Self-pay | Admitting: *Deleted

## 2015-04-25 ENCOUNTER — Ambulatory Visit (HOSPITAL_COMMUNITY): Payer: Medicaid Other

## 2015-04-28 ENCOUNTER — Ambulatory Visit (HOSPITAL_COMMUNITY)
Admission: RE | Admit: 2015-04-28 | Discharge: 2015-04-28 | Disposition: A | Discharge status: Home / Self Care | Payer: Medicaid Other | Source: Ambulatory Visit | Attending: Obstetrics & Gynecology | Admitting: Obstetrics & Gynecology

## 2015-04-28 DIAGNOSIS — N938 Other specified abnormal uterine and vaginal bleeding: Secondary | ICD-10-CM | POA: Diagnosis present

## 2015-05-16 ENCOUNTER — Encounter: Payer: Medicaid Other | Admitting: Obstetrics & Gynecology

## 2015-08-27 ENCOUNTER — Other Ambulatory Visit: Payer: Self-pay | Admitting: Neurology

## 2015-08-27 MED ORDER — ALBUTEROL SULFATE HFA 108 (90 BASE) MCG/ACT IN AERS: 2.0000 | INHALATION_SPRAY | Freq: Four times a day (QID) | RESPIRATORY_TRACT | Status: DC | PRN

## 2015-09-09 ENCOUNTER — Telehealth: Payer: Self-pay

## 2015-09-09 NOTE — Telephone Encounter (Signed)
Spoke to [name] and advised her to bring her CPAP to her appt on Thursday with Dr. Brett Fairy.  [name] verbalized understanding.

## 2015-09-11 ENCOUNTER — Institutional Professional Consult (permissible substitution): Payer: Medicaid Other | Admitting: Neurology

## 2015-09-11 ENCOUNTER — Telehealth: Payer: Self-pay

## 2015-09-11 NOTE — Telephone Encounter (Signed)
Pt did not show for their appt with Dr. Dohmeier today.  

## 2015-09-18 ENCOUNTER — Other Ambulatory Visit: Payer: Self-pay | Admitting: Neurology

## 2015-09-18 MED ORDER — MOMETASONE FURO-FORMOTEROL FUM 200-5 MCG/ACT IN AERO: 2.0000 | INHALATION_SPRAY | Freq: Two times a day (BID) | RESPIRATORY_TRACT | Status: DC

## 2015-10-04 ENCOUNTER — Emergency Department (INDEPENDENT_AMBULATORY_CARE_PROVIDER_SITE_OTHER)
Admission: EM | Admit: 2015-10-04 | Discharge: 2015-10-04 | Disposition: A | Discharge status: Home / Self Care | Payer: Medicaid Other | Source: Home / Self Care

## 2015-10-04 ENCOUNTER — Encounter (HOSPITAL_COMMUNITY): Payer: Self-pay | Admitting: Emergency Medicine

## 2015-10-04 DIAGNOSIS — J069 Acute upper respiratory infection, unspecified: Secondary | ICD-10-CM

## 2015-10-04 MED ORDER — FLUTICASONE PROPIONATE 50 MCG/ACT NA SUSP: 2.0000 | Freq: Every day | NASAL | Status: DC

## 2015-10-04 MED ORDER — PREDNISONE 10 MG PO TABS: 20.0000 mg | ORAL_TABLET | Freq: Every day | ORAL | Status: DC

## 2015-10-04 MED ORDER — GUAIFENESIN ER 600 MG PO TB12: 1200.0000 mg | ORAL_TABLET | Freq: Two times a day (BID) | ORAL | Status: DC

## 2015-10-04 NOTE — ED Notes (Signed)
The patient presented to the [facility] with a complaint of her asthma being irritated and a cough and wheezing with chest wall pain that has been ongoing for 3 days.

## 2015-10-04 NOTE — ED Provider Notes (Signed)
CSN: [id number]     Arrival date & time 10/04/15  1909 History   None    Chief Complaint  Patient presents with  . Asthma  . Wheezing  . Facial Pain   (Consider location/radiation/quality/duration/timing/severity/associated sxs/prior Treatment) Patient is a 39 y.o. female presenting with asthma and wheezing. The history is provided by the patient. No language interpreter was used.  Asthma Pertinent negatives include no chest pain.  Wheezing Associated symptoms: cough   Associated symptoms: no chest pain, no chest tightness, no ear pain, no fatigue and no fever    Patient presents with complaint of 2 days' cough, chest congestion, nasal congestion.  No fevers or chills, no wheezes or shortness of breath.  She reports that she does not feel like she has an asthma flare; has used albuterol MDI without relief in this illness episode.  Denies ear pain or sore throat. Does have nasal congestion.   PMHx; Asthma, last flare was over the summer. Triggers include pollen, heat, secondhand smoke.  Has not been hospitalized for asthma since adolescence.  Strong family hx of asthma in siblings, father with mesothelioma.   Patient is a nonsmoker.   Past Medical History  Diagnosis Date  . Asthma   . OSA (obstructive sleep apnea)   . History of blood clots   . Morbid obesity ([place])   . Allergic rhinitis   . Anemia   . Anxiety disorder   . Benign essential hypertension   . Depression   . Edema   . Hyperglycemia   . Lumbar radiculopathy   . Metrorrhagia   . Diabetes mellitus without complication ([place])   . Snoring    Past Surgical History  Procedure Laterality Date  . Cholecystectomy    . Tubal ligation    . Cesarean section     Family History  Problem Relation Age of Onset  . Asthma Mother   . Leukemia Father   . Breast cancer Maternal Aunt   . Breast cancer Paternal Aunt   . Diabetes Father   . Diabetes Brother   . Hypertension Mother    Social History  Substance Use Topics  .  Smoking status: Never Smoker   . Smokeless tobacco: Never Used  . Alcohol Use: No   OB History    No data available     Review of Systems  Constitutional: Negative for fever and fatigue.  HENT: Negative for ear pain.   Respiratory: Positive for cough. Negative for chest tightness and wheezing.   Cardiovascular: Negative for chest pain.       Chest burning with cough, no true chest pain.    Allergies  Fish allergy; Amoxicillin; Other; Strawberry extract; Celexa; and Gabapentin  Home Medications   Prior to Admission medications   Medication Sig Start Date End Date Taking? Authorizing Provider  albuterol (PROVENTIL HFA;VENTOLIN HFA) 108 (90 BASE) MCG/ACT inhaler Inhale 2 puffs into the lungs every 6 (six) hours as needed for wheezing or shortness of breath. For shortness of breath 02/02/13   Thurnell Lose, MD  albuterol (PROVENTIL HFA;VENTOLIN HFA) 108 (90 BASE) MCG/ACT inhaler Inhale 2 puffs into the lungs every 6 (six) hours as needed for wheezing or shortness of breath. 08/27/15   Charlies Silvers, MD  Albuterol Sulfate (PROAIR HFA IN) Inhale into the lungs. 1-2 puffs every 4-6 hours as needed.    Historical Provider, MD  cetirizine (ZYRTEC) 10 MG tablet Take 1 tablet (10 mg total) by mouth daily as needed. For allergies 02/02/13  Thurnell Lose, MD  cyclobenzaprine (FLEXERIL) 10 MG tablet Take 10 mg by mouth 3 (three) times daily as needed for muscle spasms.    Historical Provider, MD  diclofenac (VOLTAREN) 75 MG EC tablet Take 75 mg by mouth 2 (two) times daily.    Historical Provider, MD  ergocalciferol (VITAMIN D2) 50000 UNITS capsule Take 50,000 Units by mouth once a week.    Historical Provider, MD  ferrous sulfate 325 (65 FE) MG tablet Take 1 tablet (325 mg total) by mouth daily with breakfast. 02/02/13   Thurnell Lose, MD  fluticasone (FLONASE) 50 MCG/ACT nasal spray Place 2 sprays into both nostrils daily.    Historical Provider, MD  fluticasone (FLONASE) 50 MCG/ACT nasal  spray Place 2 sprays into both nostrils daily. 10/04/15   Willeen Niece, MD  Fluticasone-Salmeterol (ADVAIR) 500-50 MCG/DOSE AEPB Inhale 1 puff into the lungs 2 (two) times daily.    Historical Provider, MD  folic acid (FOLVITE) 1 MG tablet Take 1 tablet (1 mg total) by mouth daily. 02/02/13   Thurnell Lose, MD  glipiZIDE (GLUCOTROL XL) 10 MG 24 hr tablet Take 10 mg by mouth daily with breakfast.    Historical Provider, MD  guaiFENesin (MUCINEX) 600 MG 12 hr tablet Take 2 tablets (1,200 mg total) by mouth 2 (two) times daily. 10/04/15   Willeen Niece, MD  hydrochlorothiazide (HYDRODIURIL) 25 MG tablet Take 25 mg by mouth daily.    Historical Provider, MD  ipratropium (ATROVENT) 0.06 % nasal spray Place 2 sprays into the nose 4 (four) times daily. 09/04/13   Billy Fischer, MD  losartan (COZAAR) 100 MG tablet Take 1 tablet (100 mg total) by mouth daily. 02/02/13   Thurnell Lose, MD  metFORMIN (GLUCOPHAGE) 1000 MG tablet Take 1,000 mg by mouth 2 (two) times daily with a meal.    Historical Provider, MD  mometasone-formoterol (DULERA) 200-5 MCG/ACT AERO Inhale 2 puffs into the lungs 2 (two) times daily. 09/18/15   Charlies Silvers, MD  montelukast (SINGULAIR) 10 MG tablet Take 10 mg by mouth at bedtime.    Historical Provider, MD  predniSONE (DELTASONE) 10 MG tablet Take 2 tablets (20 mg total) by mouth daily. 10/04/15   Willeen Niece, MD  pregabalin (LYRICA) 150 MG capsule Take 1 capsule (150 mg total) by mouth at bedtime. 05/12/14   Liam Graham, PA-C  ranitidine (ZANTAC) 150 MG capsule Take 1 capsule (150 mg total) by mouth 2 (two) times daily. 05/12/14   Freeman Caldron Baker, PA-C  sennosides-docusate sodium (SENOKOT-S) 8.6-50 MG tablet Take 1 tablet by mouth daily.    Historical Provider, MD   Meds Ordered and Administered this Visit  Medications - No data to display  BP 175/89 mmHg  Pulse 102  Temp(Src) 98.9 F (37.2 C) (Oral)  SpO2 97%  LMP 10/04/2015 (Exact Date) No data found.   Physical  Exam  Constitutional: She appears well-developed and well-nourished. No distress.  Generally well appearing, no distress.  Breathing comfortably and speaking fluidly in full sentences.   HENT:  Head: Normocephalic and atraumatic.  Right Ear: External ear normal.  Left Ear: External ear normal.  Mouth/Throat: No oropharyngeal exudate.  Nasal mucosa boggy and edematous, no purulent discharge noted. Mild tenderness of bilateral maxillary sinuses.   Eyes: Conjunctivae and EOM are normal. Pupils are equal, round, and reactive to light. Right eye exhibits no discharge. Left eye exhibits no discharge.  Neck: Normal range of motion. Neck supple. No  tracheal deviation present. No thyromegaly present.  Cardiovascular: Normal rate, regular rhythm and normal heart sounds.   No murmur heard. Pulmonary/Chest: Breath sounds normal. No stridor. No respiratory distress. She has no wheezes. She has no rales. She exhibits no tenderness.  Lymphadenopathy:    She has no cervical adenopathy.  Skin: She is not diaphoretic.    ED Course  Procedures (including critical care time)  Labs Review Labs Reviewed - No data to display  Imaging Review No results found.   Visual Acuity Review  Right Eye Distance:   Left Eye Distance:   Bilateral Distance:    Right Eye Near:   Left Eye Near:    Bilateral Near:         MDM   1. Upper respiratory infection, acute    Patient with likely viral URI, no signs or symptoms of acute asthma exacerbation. Symptomatic supportive therapy with mucolytics, flonase.  She is being given a prescrption for prednisone 20mg  daily which she is instructed to fill only if she begins with early asthma symptoms, which she can easily recognize.  She has DM and has had marked glucose elevations with prednisone in the past and knows to use only if onset of asthma.  Follow up with her primary doctor.     Willeen Niece, MD 10/04/15 2027

## 2015-10-04 NOTE — Discharge Instructions (Signed)
It was a pleasure to see you today.  I believe your symptoms are caused by a viral respiratory infection.   I recommend guaifenesin (the active ingredient in Mucinex) 600mg  tablets, take 1 to 2 tablets by mouth twice daily.  This will thin the chest and nasal secretions and make them easier to get out.   Flonase 2 sprays per nostril once daily in the morning.   I am giving you a prescription for Prednisone 10mg  tablets which I do not want to you take now.  TAKE ONLY IF YOU BEGIN TO HAVE WHEEZING AND SYMPTOMS OF THE BEGINNING OF AN ASTHMA FLARE.  I recommend that you keep this prescription on hand for this instance.  If you fill it, take 2 tablets (20mg ) by mouth once daily and be in touch with your primary doctor.   Follow up early in the week with your primary doctor if you are not feeling better.

## 2015-10-13 ENCOUNTER — Other Ambulatory Visit: Payer: Self-pay

## 2015-10-13 NOTE — Telephone Encounter (Signed)
Denied refill for ProAir.  Last OV 12-30-2014 to follow-up in one month.  Previously refilled on 08-27-15 with no further refills until seen by physician.

## 2015-11-11 ENCOUNTER — Other Ambulatory Visit: Payer: Self-pay

## 2015-11-11 MED ORDER — MOMETASONE FURO-FORMOTEROL FUM 200-5 MCG/ACT IN AERO: 2.0000 | INHALATION_SPRAY | Freq: Two times a day (BID) | RESPIRATORY_TRACT | Status: DC

## 2015-11-11 MED ORDER — ALBUTEROL SULFATE HFA 108 (90 BASE) MCG/ACT IN AERS: 2.0000 | INHALATION_SPRAY | Freq: Four times a day (QID) | RESPIRATORY_TRACT | Status: DC | PRN

## 2016-02-09 ENCOUNTER — Other Ambulatory Visit: Payer: Self-pay

## 2016-02-09 MED ORDER — MOMETASONE FURO-FORMOTEROL FUM 200-5 MCG/ACT IN AERO: INHALATION_SPRAY | RESPIRATORY_TRACT | Status: DC

## 2016-02-09 MED ORDER — ALBUTEROL SULFATE HFA 108 (90 BASE) MCG/ACT IN AERS: INHALATION_SPRAY | RESPIRATORY_TRACT | Status: AC

## 2016-02-27 ENCOUNTER — Other Ambulatory Visit: Payer: Self-pay

## 2016-03-24 ENCOUNTER — Other Ambulatory Visit: Payer: Self-pay | Admitting: *Deleted

## 2016-04-22 ENCOUNTER — Other Ambulatory Visit: Payer: Self-pay

## 2016-04-22 ENCOUNTER — Emergency Department (HOSPITAL_BASED_OUTPATIENT_CLINIC_OR_DEPARTMENT_OTHER)
Admit: 2016-04-22 | Discharge: 2016-04-22 | Disposition: A | Discharge status: Home / Self Care | Payer: Medicaid Other | Attending: Emergency Medicine | Admitting: Emergency Medicine

## 2016-04-22 ENCOUNTER — Encounter (HOSPITAL_COMMUNITY): Payer: Self-pay

## 2016-04-22 ENCOUNTER — Emergency Department (HOSPITAL_COMMUNITY): Payer: Medicaid Other

## 2016-04-22 ENCOUNTER — Emergency Department (HOSPITAL_COMMUNITY)
Admission: EM | Admit: 2016-04-22 | Discharge: 2016-04-22 | Disposition: A | Discharge status: Home / Self Care | Payer: Medicaid Other | Attending: Emergency Medicine | Admitting: Emergency Medicine

## 2016-04-22 DIAGNOSIS — I10 Essential (primary) hypertension: Secondary | ICD-10-CM | POA: Insufficient documentation

## 2016-04-22 DIAGNOSIS — D509 Iron deficiency anemia, unspecified: Secondary | ICD-10-CM

## 2016-04-22 DIAGNOSIS — R0602 Shortness of breath: Secondary | ICD-10-CM | POA: Diagnosis not present

## 2016-04-22 DIAGNOSIS — F329 Major depressive disorder, single episode, unspecified: Secondary | ICD-10-CM | POA: Diagnosis not present

## 2016-04-22 DIAGNOSIS — D473 Essential (hemorrhagic) thrombocythemia: Secondary | ICD-10-CM | POA: Diagnosis not present

## 2016-04-22 DIAGNOSIS — D508 Other iron deficiency anemias: Secondary | ICD-10-CM | POA: Diagnosis not present

## 2016-04-22 DIAGNOSIS — M7989 Other specified soft tissue disorders: Secondary | ICD-10-CM

## 2016-04-22 DIAGNOSIS — M79609 Pain in unspecified limb: Secondary | ICD-10-CM

## 2016-04-22 DIAGNOSIS — Z79899 Other long term (current) drug therapy: Secondary | ICD-10-CM | POA: Insufficient documentation

## 2016-04-22 DIAGNOSIS — D75839 Thrombocytosis, unspecified: Secondary | ICD-10-CM

## 2016-04-22 DIAGNOSIS — E1165 Type 2 diabetes mellitus with hyperglycemia: Secondary | ICD-10-CM | POA: Insufficient documentation

## 2016-04-22 DIAGNOSIS — M660 Rupture of popliteal cyst: Secondary | ICD-10-CM

## 2016-04-22 DIAGNOSIS — J45909 Unspecified asthma, uncomplicated: Secondary | ICD-10-CM | POA: Insufficient documentation

## 2016-04-22 DIAGNOSIS — R2242 Localized swelling, mass and lump, left lower limb: Secondary | ICD-10-CM | POA: Diagnosis present

## 2016-04-22 DIAGNOSIS — Z7984 Long term (current) use of oral hypoglycemic drugs: Secondary | ICD-10-CM | POA: Diagnosis not present

## 2016-04-22 LAB — CBC WITH DIFFERENTIAL/PLATELET
BASOS PCT: 0 %
Basophils Absolute: 0 10*3/uL (ref 0.0–0.1)
EOS PCT: 2 %
Eosinophils Absolute: 0.2 10*3/uL (ref 0.0–0.7)
HEMATOCRIT: 31.5 % — AB (ref 36.0–46.0)
Hemoglobin: 9.4 g/dL — ABNORMAL LOW (ref 12.0–15.0)
LYMPHS ABS: 2.4 10*3/uL (ref 0.7–4.0)
Lymphocytes Relative: 24 %
MCH: 17.6 pg — AB (ref 26.0–34.0)
MCHC: 29.8 g/dL — ABNORMAL LOW (ref 30.0–36.0)
MCV: 58.9 fL — AB (ref 78.0–100.0)
MONO ABS: 0.5 10*3/uL (ref 0.1–1.0)
MONOS PCT: 5 %
NEUTROS ABS: 6.7 10*3/uL (ref 1.7–7.7)
Neutrophils Relative %: 69 %
PLATELETS: 557 10*3/uL — AB (ref 150–400)
RBC: 5.35 MIL/uL — ABNORMAL HIGH (ref 3.87–5.11)
RDW: 20.5 % — AB (ref 11.5–15.5)
WBC: 9.8 10*3/uL (ref 4.0–10.5)

## 2016-04-22 LAB — BASIC METABOLIC PANEL
Anion gap: 5 (ref 5–15)
BUN: 11 mg/dL (ref 6–20)
CALCIUM: 8.6 mg/dL — AB (ref 8.9–10.3)
CHLORIDE: 103 mmol/L (ref 101–111)
CO2: 28 mmol/L (ref 22–32)
CREATININE: 0.8 mg/dL (ref 0.44–1.00)
GFR calc non Af Amer: >60 mL/min (ref >60)
GLUCOSE: 115 mg/dL — AB (ref 65–99)
Potassium: 5.2 mmol/L — ABNORMAL HIGH (ref 3.5–5.1)
Sodium: 136 mmol/L (ref 135–145)

## 2016-04-22 LAB — D-DIMER, QUANTITATIVE: D-Dimer, Quant: 0.32 ug/mL-FEU (ref 0.00–0.50)

## 2016-04-22 LAB — BRAIN NATRIURETIC PEPTIDE: B Natriuretic Peptide: 12.8 pg/mL (ref 0.0–100.0)

## 2016-04-22 LAB — TROPONIN I: Troponin I: <0.03 ng/mL (ref <0.031)

## 2016-04-22 MED ORDER — OXYCODONE-ACETAMINOPHEN 5-325 MG PO TABS
1.0000 | ORAL_TABLET | Freq: Once | ORAL | Status: AC
  Administered 2016-04-22: 1 via ORAL
  Filled 2016-04-22: qty 1

## 2016-04-22 MED ORDER — OXYCODONE-ACETAMINOPHEN 5-325 MG PO TABS: 1.0000 | ORAL_TABLET | ORAL | Status: DC | PRN

## 2016-04-22 MED ORDER — NAPROXEN 500 MG PO TABS: 500.0000 mg | ORAL_TABLET | Freq: Two times a day (BID) | ORAL | Status: DC

## 2016-04-22 NOTE — Progress Notes (Signed)
VASCULAR LAB PRELIMINARY  PRELIMINARY  PRELIMINARY  PRELIMINARY  Left lower extremity venous duplex completed.    Preliminary report:  Left:  No evidence of DVTor superficial thrombosis, Findings consistent wuth a ruptured Baker's cyst Baker's cyst.  Demian Maisel, RVS 04/22/2016, 4:33 PM

## 2016-04-22 NOTE — ED Notes (Signed)
Pt c/o SOB and LLE swelling x 1 week.  Pain score 6/10.  Pt reports taking ibuprofen w/o relief.  Hx of blood clots.  Pt is not on blood thinners.  No warmth or discoloration noted to LLE.

## 2016-04-22 NOTE — Discharge Instructions (Signed)
Use crutches as needed. Take acetaminophen as needed for less severe pain. Do not take ibuprofen while taking naproxen.  Baker Cyst A Baker cyst is a sac-like structure that forms in the back of the knee. It is filled with the same fluid that is located in your knee. This fluid lubricates the bones and cartilage of the knee and allows them to move over each other more easily. CAUSES  When the knee becomes injured or inflamed, increased fluid forms in the knee. When this happens, the joint lining is pushed out behind the knee and forms the Baker cyst. This cyst may also be caused by inflammation from arthritic conditions and infections. SIGNS AND SYMPTOMS  A Baker cyst usually has no symptoms. When the cyst is substantially enlarged:  You may feel pressure behind the knee, stiffness in the knee, or a mass in the area behind the knee.  You may develop pain, redness, and swelling in the calf. This can suggest a blood clot and requires evaluation by your health care provider. DIAGNOSIS  A Baker cyst is most often found during an ultrasound exam. This exam may have been performed for other reasons, and the cyst was found incidentally. Sometimes an MRI is used. This picks up other problems within a joint that an ultrasound exam may not. If the Baker cyst developed immediately after an injury, X-ray exams may be used to diagnose the cyst. TREATMENT  The treatment depends on the cause of the cyst. Anti-inflammatory medicines and rest often will be prescribed. If the cyst is caused by a bacterial infection, antibiotic medicines may be prescribed.  HOME CARE INSTRUCTIONS  1. If the cyst was caused by an injury, for the first 24 hours, keep the injured leg elevated on 2 pillows while lying down. 2. For the first 24 hours while you are awake, apply ice to the injured area: 1. Put ice in a plastic bag. 2. Place a towel between your skin and the bag. 3. Leave the ice on for 20 minutes, 2-3 times a  day. 3. Only take over-the-counter or prescription medicines for pain, discomfort, or fever as directed by your health care provider. 4. Only take antibiotic medicine as directed. Make sure to finish it even if you start to feel better. MAKE SURE YOU:  1. Understand these instructions. 2. Will watch your condition. 3. Will get help right away if you are not doing well or get worse.   This information is not intended to replace advice given to you by your health care provider. Make sure you discuss any questions you have with your health care provider.   Document Released: 10/18/2005 Document Revised: 08/08/2013 Document Reviewed: 05/30/2013 Elsevier Interactive Patient Education 2016 [place] Use Crutches are used to take weight off one of your legs or feet when you stand or walk. It is important to use crutches that fit properly. When fitted properly:  Each crutch should be 2-3 finger widths below the armpit.  Your weight should be supported by your hand, and not by resting the armpit on the crutch. RISKS AND COMPLICATIONS Damage to the nerves that extend from your armpit to your hand and arm. To prevent this from happening, make sure your crutches fit properly and do not put pressure on your armpit when using them. HOW TO USE YOUR CRUTCHES If you have been instructed to use partial weight bearing, apply (bear) the amount of weight as your health care provider suggests. Do not bear weight in  an amount that causes pain to the area of injury. Walking 5. Step with the crutches. 6. Swing the healthy leg slightly ahead of the crutches. Going Up Steps If there is no handrail: 4. Step up with the healthy leg. 5. Step up with the crutches and injured leg. 6. Continue in this way. If there is a handrail: 1. Hold both crutches in one hand. 2. Place your free hand on the handrail. 3. While putting your weight on your arms, lift your healthy leg to the step. 4. Bring the  crutches and the injured leg up to that step. 5. Continue in this way. Going Down Steps Be very careful, as going down stairs with crutches is very challenging. If there is no handrail: 1. Step down with the injured leg and crutches. 2. Step down with the healthy leg. If there is a handrail: 1. Place your hand on the handrail. 2. Hold both crutches with your free hand. 3. Lower your injured leg and crutch to the step below you. Make sure to keep the crutch tips in the center of the step, never on the edge. 4. Lower your healthy leg to that step. 5. Continue in this way. Standing Up 1. Hold the injured leg forward. 2. Grab the armrest with one hand and the top of the crutches with the other hand. 3. Using these supports, pull yourself up to a standing position. Sitting Down 1. Hold the injured leg forward. 2. Grab the armrest with one hand and the top of the crutches with the other hand. 3. Lower yourself to a sitting position. SEEK MEDICAL CARE IF:  You still feel unsteady on your feet.  You develop new pain, for example in your armpits, back, shoulder, wrist, or hip.  You develop any numbness or tingling. SEEK IMMEDIATE MEDICAL CARE IF:  You fall.   This information is not intended to replace advice given to you by your health care provider. Make sure you discuss any questions you have with your health care provider.   Document Released: 10/15/2000 Document Revised: 11/08/2014 Document Reviewed: 06/25/2013 Elsevier Interactive Patient Education 2016 [employer]  Naproxen and naproxen sodium oral immediate-release tablets What is this medicine? NAPROXEN (na PROX en) is a non-steroidal anti-inflammatory drug (NSAID). It is used to reduce swelling and to treat pain. This medicine may be used for dental pain, headache, or painful monthly periods. It is also used for painful joint and muscular problems such as arthritis, tendinitis, bursitis, and gout. This medicine may be used  for other purposes; ask your health care provider or pharmacist if you have questions. What should I tell my health care provider before I take this medicine? They need to know if you have any of these conditions: -asthma -cigarette smoker -drink more than 3 alcohol containing drinks a day -heart disease or circulation problems such as heart failure or leg edema (fluid retention) -high blood pressure -kidney disease -liver disease -stomach bleeding or ulcers -an unusual or allergic reaction to naproxen, aspirin, other NSAIDs, other medicines, foods, dyes, or preservatives -pregnant or trying to get pregnant -breast-feeding How should I use this medicine? Take this medicine by mouth with a glass of water. Follow the directions on the prescription label. Take it with food if your stomach gets upset. Try to not lie down for at least 10 minutes after you take it. Take your medicine at regular intervals. Do not take your medicine more often than directed. Long-term, continuous use may increase the risk of  heart attack or stroke. A special MedGuide will be given to you by the pharmacist with each prescription and refill. Be sure to read this information carefully each time. Talk to your pediatrician regarding the use of this medicine in children. Special care may be needed. Overdosage: If you think you have taken too much of this medicine contact a poison control center or emergency room at once. NOTE: This medicine is only for you. Do not share this medicine with others. What if I miss a dose? If you miss a dose, take it as soon as you can. If it is almost time for your next dose, take only that dose. Do not take double or extra doses. What may interact with this medicine? -alcohol -aspirin -cidofovir -diuretics -lithium -methotrexate -other drugs for inflammation like ketorolac or prednisone -pemetrexed -probenecid -warfarin This list may not describe all possible interactions. Give your  health care provider a list of all the medicines, herbs, non-prescription drugs, or dietary supplements you use. Also tell them if you smoke, drink alcohol, or use illegal drugs. Some items may interact with your medicine. What should I watch for while using this medicine? Tell your doctor or health care professional if your pain does not get better. Talk to your doctor before taking another medicine for pain. Do not treat yourself. This medicine does not prevent heart attack or stroke. In fact, this medicine may increase the chance of a heart attack or stroke. The chance may increase with longer use of this medicine and in people who have heart disease. If you take aspirin to prevent heart attack or stroke, talk with your doctor or health care professional. Do not take other medicines that contain aspirin, ibuprofen, or naproxen with this medicine. Side effects such as stomach upset, nausea, or ulcers may be more likely to occur. Many medicines available without a prescription should not be taken with this medicine. This medicine can cause ulcers and bleeding in the stomach and intestines at any time during treatment. Do not smoke cigarettes or drink alcohol. These increase irritation to your stomach and can make it more susceptible to damage from this medicine. Ulcers and bleeding can happen without warning symptoms and can cause death. You may get drowsy or dizzy. Do not drive, use machinery, or do anything that needs mental alertness until you know how this medicine affects you. Do not stand or sit up quickly, especially if you are an older patient. This reduces the risk of dizzy or fainting spells. This medicine can cause you to bleed more easily. Try to avoid damage to your teeth and gums when you brush or floss your teeth. What side effects may I notice from receiving this medicine? Side effects that you should report to your doctor or health care professional as soon as possible: -black or bloody  stools, blood in the urine or vomit -blurred vision -chest pain -difficulty breathing or wheezing -nausea or vomiting -severe stomach pain -skin rash, skin redness, blistering or peeling skin, hives, or itching -slurred speech or weakness on one side of the body -swelling of eyelids, throat, lips -unexplained weight gain or swelling -unusually weak or tired -yellowing of eyes or skin Side effects that usually do not require medical attention (report to your doctor or health care professional if they continue or are bothersome): -constipation -headache -heartburn This list may not describe all possible side effects. Call your doctor for medical advice about side effects. You may report side effects to FDA at 1-800-FDA-1088. Where  should I keep my medicine? Keep out of the reach of children. Store at room temperature between 15 and 30 degrees C (59 and 86 degrees F). Keep container tightly closed. Throw away any unused medicine after the expiration date. NOTE: This sheet is a summary. It may not cover all possible information. If you have questions about this medicine, talk to your doctor, pharmacist, or health care provider.    2016, Elsevier/Gold Standard. (2009-10-20 20:10:16)  Acetaminophen; Oxycodone tablets What is this medicine? ACETAMINOPHEN; OXYCODONE (a set a MEE noe fen; ox i KOE done) is a pain reliever. It is used to treat moderate to severe pain. This medicine may be used for other purposes; ask your health care provider or pharmacist if you have questions. What should I tell my health care provider before I take this medicine? They need to know if you have any of these conditions: -brain tumor -Crohn's disease, inflammatory bowel disease, or ulcerative colitis -drug abuse or addiction -head injury -heart or circulation problems -if you often drink alcohol -kidney disease or problems going to the bathroom -liver disease -lung disease, asthma, or breathing  problems -an unusual or allergic reaction to acetaminophen, oxycodone, other opioid analgesics, other medicines, foods, dyes, or preservatives -pregnant or trying to get pregnant -breast-feeding How should I use this medicine? Take this medicine by mouth with a full glass of water. Follow the directions on the prescription label. You can take it with or without food. If it upsets your stomach, take it with food. Take your medicine at regular intervals. Do not take it more often than directed. Talk to your pediatrician regarding the use of this medicine in children. Special care may be needed. Patients over 73 years old may have a stronger reaction and need a smaller dose. Overdosage: If you think you have taken too much of this medicine contact a poison control center or emergency room at once. NOTE: This medicine is only for you. Do not share this medicine with others. What if I miss a dose? If you miss a dose, take it as soon as you can. If it is almost time for your next dose, take only that dose. Do not take double or extra doses. What may interact with this medicine? -alcohol -antihistamines -barbiturates like amobarbital, butalbital, butabarbital, methohexital, pentobarbital, phenobarbital, thiopental, and secobarbital -benztropine -drugs for bladder problems like solifenacin, trospium, oxybutynin, tolterodine, hyoscyamine, and methscopolamine -drugs for breathing problems like ipratropium and tiotropium -drugs for certain stomach or intestine problems like propantheline, homatropine methylbromide, glycopyrrolate, atropine, belladonna, and dicyclomine -general anesthetics like etomidate, ketamine, nitrous oxide, propofol, desflurane, enflurane, halothane, isoflurane, and sevoflurane -medicines for depression, anxiety, or psychotic disturbances -medicines for sleep -muscle relaxants -naltrexone -narcotic medicines (opiates) for pain -phenothiazines like perphenazine, thioridazine,  chlorpromazine, mesoridazine, fluphenazine, prochlorperazine, promazine, and trifluoperazine -scopolamine -tramadol -trihexyphenidyl This list may not describe all possible interactions. Give your health care provider a list of all the medicines, herbs, non-prescription drugs, or dietary supplements you use. Also tell them if you smoke, drink alcohol, or use illegal drugs. Some items may interact with your medicine. What should I watch for while using this medicine? Tell your doctor or health care professional if your pain does not go away, if it gets worse, or if you have new or a different type of pain. You may develop tolerance to the medicine. Tolerance means that you will need a higher dose of the medication for pain relief. Tolerance is normal and is expected if you take this medicine  for a long time. Do not suddenly stop taking your medicine because you may develop a severe reaction. Your body becomes used to the medicine. This does NOT mean you are addicted. Addiction is a behavior related to getting and using a drug for a non-medical reason. If you have pain, you have a medical reason to take pain medicine. Your doctor will tell you how much medicine to take. If your doctor wants you to stop the medicine, the dose will be slowly lowered over time to avoid any side effects. You may get drowsy or dizzy. Do not drive, use machinery, or do anything that needs mental alertness until you know how this medicine affects you. Do not stand or sit up quickly, especially if you are an older patient. This reduces the risk of dizzy or fainting spells. Alcohol may interfere with the effect of this medicine. Avoid alcoholic drinks. There are different types of narcotic medicines (opiates) for pain. If you take more than one type at the same time, you may have more side effects. Give your health care provider a list of all medicines you use. Your doctor will tell you how much medicine to take. Do not take more  medicine than directed. Call emergency for help if you have problems breathing. The medicine will cause constipation. Try to have a bowel movement at least every 2 to 3 days. If you do not have a bowel movement for 3 days, call your doctor or health care professional. Do not take Tylenol (acetaminophen) or medicines that have acetaminophen with this medicine. Too much acetaminophen can be very dangerous. Many nonprescription medicines contain acetaminophen. Always read the labels carefully to avoid taking more acetaminophen. What side effects may I notice from receiving this medicine? Side effects that you should report to your doctor or health care professional as soon as possible: -allergic reactions like skin rash, itching or hives, swelling of the face, lips, or tongue -breathing difficulties, wheezing -confusion -light headedness or fainting spells -severe stomach pain -unusually weak or tired -yellowing of the skin or the whites of the eyes Side effects that usually do not require medical attention (report to your doctor or health care professional if they continue or are bothersome): -dizziness -drowsiness -nausea -vomiting This list may not describe all possible side effects. Call your doctor for medical advice about side effects. You may report side effects to FDA at 1-800-FDA-1088. Where should I keep my medicine? Keep out of the reach of children. This medicine can be abused. Keep your medicine in a safe place to protect it from theft. Do not share this medicine with anyone. Selling or giving away this medicine is dangerous and against the law. This medicine may cause accidental overdose and death if it taken by other adults, children, or pets. Mix any unused medicine with a substance like cat litter or coffee grounds. Then throw the medicine away in a sealed container like a sealed bag or a coffee can with a lid. Do not use the medicine after the expiration date. Store at room  temperature between 20 and 25 degrees C (68 and 77 degrees F). NOTE: This sheet is a summary. It may not cover all possible information. If you have questions about this medicine, talk to your doctor, pharmacist, or health care provider.    2016, Elsevier/[employer] Standard. (2014-09-18 15:18:46)

## 2016-04-22 NOTE — ED Provider Notes (Signed)
CSN: [id number]     Arrival date & time 04/22/16  1146 History   First MD Initiated Contact with Patient 04/22/16 1453     Chief Complaint  Patient presents with  . Shortness of Breath  . Leg Swelling     (Consider location/radiation/quality/duration/timing/severity/associated sxs/prior Treatment) Patient is a 40 y.o. [name] presenting with shortness of breath. The history is provided by the patient.  Shortness of Breath Her main complaint is pain in her left calf proximally. This is been present for the last 2 days and is associated with swelling in the calf. She rates her pain at 6/10. Ibuprofen has not given her any relief. She had some slight chest discomfort and dyspnea this morning. Dyspnea is worse with any exertion, chest pain is gone away completely. Calf pain is worse with walking. 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 Also, she has a history of bulging disc in her back. She denies any recent trauma. There's been no recent surgery, long distance travel. She has no known history of malignancy. She is morbidly obese but has lost some weight and her weight is down to 460 pounds.  Past Medical History  Diagnosis Date  . Asthma   . OSA (obstructive sleep apnea)   . History of blood clots   . Morbid obesity ([place])   . Allergic rhinitis   . Anemia   . Anxiety disorder   . Benign essential hypertension   . Depression   . Edema   . Hyperglycemia   . Lumbar radiculopathy   . Metrorrhagia   . Diabetes mellitus without complication ([place])   . Snoring    Past Surgical History  Procedure Laterality Date  . Cholecystectomy    . Tubal ligation    . Cesarean section     Family History  Problem Relation Age of Onset  . Asthma Mother   . Leukemia Father   . Breast cancer Maternal Aunt   . Breast cancer Paternal Aunt   . Diabetes Father   . Diabetes Brother   . Hypertension Mother    Social History  Substance Use Topics  .  Smoking status: Never Smoker   . Smokeless tobacco: Never Used  . Alcohol Use: Yes   OB History    No data available     Review of Systems  Respiratory: Positive for shortness of breath.   All other systems reviewed and are negative.     Allergies  Fish allergy; Amoxicillin; Other; Strawberry extract; Celexa; and Gabapentin  Home Medications   Prior to Admission medications   Medication Sig Start Date End Date Taking? Authorizing Provider  albuterol (PROAIR HFA) 108 (90 Base) MCG/ACT inhaler Use 2 puffs every 4 hours as needed for cough or wheeze.  May use 2 puffs 10-20 minutes prior to exercise. 02/09/16   Charlies Silvers, MD  albuterol (PROVENTIL HFA;VENTOLIN HFA) 108 (90 BASE) MCG/ACT inhaler Inhale 2 puffs into the lungs every 6 (six) hours as needed for wheezing or shortness of breath. 08/27/15   Charlies Silvers, MD  albuterol (PROVENTIL HFA;VENTOLIN HFA) 108 (90 Base) MCG/ACT inhaler Inhale 2 puffs into the lungs every 6 (six) hours as needed for wheezing or shortness of breath. For shortness of breath 11/11/15   Charlies Silvers, MD  Albuterol Sulfate (PROAIR HFA IN) Inhale into the lungs. 1-2 puffs every 4-6 hours as needed.    Historical Provider, MD  cetirizine (ZYRTEC) 10 MG tablet Take 1 tablet (  10 mg total) by mouth daily as needed. For allergies 02/02/13   Thurnell Lose, MD  cyclobenzaprine (FLEXERIL) 10 MG tablet Take 10 mg by mouth 3 (three) times daily as needed for muscle spasms.    Historical Provider, MD  diclofenac (VOLTAREN) 75 MG EC tablet Take 75 mg by mouth 2 (two) times daily.    Historical Provider, MD  ergocalciferol (VITAMIN D2) 50000 UNITS capsule Take 50,000 Units by mouth once a week.    Historical Provider, MD  ferrous sulfate 325 (65 FE) MG tablet Take 1 tablet (325 mg total) by mouth daily with breakfast. 02/02/13   Thurnell Lose, MD  fluticasone (FLONASE) 50 MCG/ACT nasal spray Place 2 sprays into both nostrils daily.    Historical Provider, MD   fluticasone (FLONASE) 50 MCG/ACT nasal spray Place 2 sprays into both nostrils daily. 10/04/15   Willeen Niece, MD  Fluticasone-Salmeterol (ADVAIR) 500-50 MCG/DOSE AEPB Inhale 1 puff into the lungs 2 (two) times daily.    Historical Provider, MD  folic acid (FOLVITE) 1 MG tablet Take 1 tablet (1 mg total) by mouth daily. 02/02/13   Thurnell Lose, MD  glipiZIDE (GLUCOTROL XL) 10 MG 24 hr tablet Take 10 mg by mouth daily with breakfast.    Historical Provider, MD  guaiFENesin (MUCINEX) 600 MG 12 hr tablet Take 2 tablets (1,200 mg total) by mouth 2 (two) times daily. 10/04/15   Willeen Niece, MD  hydrochlorothiazide (HYDRODIURIL) 25 MG tablet Take 25 mg by mouth daily.    Historical Provider, MD  ipratropium (ATROVENT) 0.06 % nasal spray Place 2 sprays into the nose 4 (four) times daily. 09/04/13   Billy Fischer, MD  losartan (COZAAR) 100 MG tablet Take 1 tablet (100 mg total) by mouth daily. 02/02/13   Thurnell Lose, MD  metFORMIN (GLUCOPHAGE) 1000 MG tablet Take 1,000 mg by mouth 2 (two) times daily with a meal.    Historical Provider, MD  mometasone-formoterol (DULERA) 200-5 MCG/ACT AERO Inhale 2 puffs into the lungs 2 (two) times daily. 11/11/15   Charlies Silvers, MD  mometasone-formoterol (DULERA) 200-5 MCG/ACT AERO Use 2 puffs twice daily to prevent cough or wheeze.  Rinse, gargle, and spit after use. 02/09/16   Charlies Silvers, MD  montelukast (SINGULAIR) 10 MG tablet Take 10 mg by mouth at bedtime.    Historical Provider, MD  predniSONE (DELTASONE) 10 MG tablet Take 2 tablets (20 mg total) by mouth daily. 10/04/15   Willeen Niece, MD  pregabalin (LYRICA) 150 MG capsule Take 1 capsule (150 mg total) by mouth at bedtime. 05/12/14   Liam Graham, PA-C  ranitidine (ZANTAC) 150 MG capsule Take 1 capsule (150 mg total) by mouth 2 (two) times daily. 05/12/14   Freeman Caldron Baker, PA-C  sennosides-docusate sodium (SENOKOT-S) 8.6-50 MG tablet Take 1 tablet by mouth daily.    Historical Provider, MD   BP  158/140 mmHg  Pulse 115  Temp(Src) 98.5 F (36.9 C) (Oral)  Resp 16  SpO2 99%  LMP 04/18/2016 Physical Exam  Nursing note and vitals reviewed.  Morbidly obese 40 year old [name], resting comfortably and in no acute distress. Vital signs are significant for hypertension and tachycardia. Oxygen saturation is 99%, which is normal. Head is normocephalic and atraumatic. PERRLA, EOMI. Oropharynx is clear. Neck is nontender and supple without adenopathy or JVD. Back is nontender and there is no CVA tenderness. Straight leg raise is positive on the left at 45. Lungs are  clear without rales, wheezes, or rhonchi. Chest is nontender. Heart has regular rate and rhythm without murmur. Abdomen is soft, flat, nontender without masses or hepatosplenomegaly and peristalsis is normoactive. Extremities have 1+ edema, full range of motion is present. Circumferences equal. There are no palpable cords. There is no warmth. There is mild tenderness in the proximal aspect of the left calf posteriorly and laterally. Homans sign is negative. Skin is warm and dry without rash. Neurologic: Mental status is normal, cranial nerves are intact, there are no motor or sensory deficits.  ED Course  Procedures (including critical care time) Labs Review Results for orders placed or performed during the hospital encounter of [id number]  Basic metabolic panel  Result Value Ref Range   Sodium 136 135 - 145 mmol/L   Potassium 5.2 (H) 3.5 - 5.1 mmol/L   Chloride 103 101 - 111 mmol/L   CO2 28 22 - 32 mmol/L   Glucose, Bld 115 (H) 65 - 99 mg/dL   BUN 11 6 - 20 mg/dL   Creatinine, Ser 0.80 0.44 - 1.00 mg/dL   Calcium 8.6 (L) 8.9 - 10.3 mg/dL   GFR calc non Af Amer >60 >60 mL/min   GFR calc Af Amer >60 >60 mL/min   Anion gap 5 5 - 15  Brain natriuretic peptide  Result Value Ref Range   B Natriuretic Peptide 12.8 0.0 - 100.0 pg/mL  CBC with Differential  Result Value Ref Range   WBC 9.8 4.0 - 10.5 K/uL   RBC 5.35 (H)  3.87 - 5.11 MIL/uL   Hemoglobin 9.4 (L) 12.0 - 15.0 g/dL   HCT 31.5 (L) 36.0 - 46.0 %   MCV 58.9 (L) 78.0 - 100.0 fL   MCH 17.6 (L) 26.0 - 34.0 pg   MCHC 29.8 (L) 30.0 - 36.0 g/dL   RDW 20.5 (H) 11.5 - 15.5 %   Platelets 557 (H) 150 - 400 K/uL   Neutrophils Relative % 69 %   Lymphocytes Relative 24 %   Monocytes Relative 5 %   Eosinophils Relative 2 %   Basophils Relative 0 %   Neutro Abs 6.7 1.7 - 7.7 K/uL   Lymphs Abs 2.4 0.7 - 4.0 K/uL   Monocytes Absolute 0.5 0.1 - 1.0 K/uL   Eosinophils Absolute 0.2 0.0 - 0.7 K/uL   Basophils Absolute 0.0 0.0 - 0.1 K/uL   Smear Review MORPHOLOGY UNREMARKABLE   Troponin I  Result Value Ref Range   Troponin I <0.03 <0.031 ng/mL  D-dimer, quantitative  Result Value Ref Range   D-Dimer, Quant 0.32 0.00 - 0.50 ug/mL-FEU   Imaging Review Dg Chest 2 View  04/22/2016  CLINICAL DATA:  SOB, center chest pain x3 days. Hx of diabetes, HTN, asthma. Nonsmoker. EXAM: CHEST  2 VIEW COMPARISON:  09/25/2012 FINDINGS: The heart size and mediastinal contours are within normal limits. Both lungs are clear. The visualized skeletal structures are unremarkable. IMPRESSION: No active cardiopulmonary disease. Electronically Signed   By: Nolon Nations M.D.   On: 04/22/2016 13:15   I have personally reviewed and evaluated these images and lab results as part of my medical decision-making.   EKG Interpretation   Date/Time:  Thursday April 22 2016 12:06:25 EDT Ventricular Rate:  117 PR Interval:    QRS Duration: Michelle QT Interval:  342 QTC Calculation: 478 R Axis:   62 Text Interpretation:  Age not entered, assumed to be  40 years old for  purpose of ECG interpretation Sinus tachycardia Low voltage, precordial  leads Borderline T abnormalities, diffuse leads Borderline prolonged QT  interval When compared with ECG of 09/26/2012, No significant change was  found Confirmed by [facility]  MD, Shelva Hetzer ([id number]) on 04/22/2016 2:54:44 PM      MDM   Final diagnoses:   Ruptured Bakers cyst  Microcytic anemia  Thrombocytosis (HCC)  Morbid obesity, unspecified obesity type ([place])    Left calf pain in patient with history of unprovoked DVT. She will be sent for venous Dopplers. Chest x-ray is unremarkable. ECG is unremarkable. Old records were reviewed and she had been admitted to the hospital with a similar presentation in November 2013 and both CT angiogram and VQ scan were not able to be obtained because of patient's body habitus. She had discharged to have CT angiogram done as an outpatient at a facility that could handle somebody of her weight but no facility was found. She remained on rivaroxaban for 1 month before stopping it for side effects (headache). She is not on any anticoagulation currently. In addition to venous Doppler, will also screen with BNP for possible CHF. However, I strongly suspect that her pain is actually musculoskeletal, possibly secondary to sciatica.  Venous duplex scan shows evidence of ruptured Baker's cyst which accounts for her symptoms. D-dimer is normal. Incidental finding of microcytic anemia and thrombocytosis which are unchanged from baseline. She is given crutches to use as needed. Prescriptions are given for naproxen and oxycodone-acetaminophen. Referred back to PCP in one week.   Delora Fuel, MD [id number]

## 2016-04-23 ENCOUNTER — Other Ambulatory Visit: Payer: Self-pay

## 2016-05-24 ENCOUNTER — Other Ambulatory Visit: Payer: Self-pay | Admitting: *Deleted

## 2016-05-24 NOTE — Telephone Encounter (Signed)
Patient needs office visit last ov 12/27/14 will fax denial [place] [phone]

## 2016-07-12 ENCOUNTER — Other Ambulatory Visit: Payer: Self-pay | Admitting: *Deleted

## 2016-09-03 ENCOUNTER — Ambulatory Visit (HOSPITAL_COMMUNITY)
Admission: EM | Admit: 2016-09-03 | Discharge: 2016-09-03 | Disposition: A | Discharge status: Home / Self Care | Payer: Medicaid Other | Attending: Family Medicine | Admitting: Family Medicine

## 2016-09-03 ENCOUNTER — Encounter (HOSPITAL_COMMUNITY): Payer: Self-pay | Admitting: *Deleted

## 2016-09-03 DIAGNOSIS — N3000 Acute cystitis without hematuria: Secondary | ICD-10-CM | POA: Diagnosis not present

## 2016-09-03 LAB — POCT URINALYSIS DIP (DEVICE)
BILIRUBIN URINE: NEGATIVE
GLUCOSE, UA: NEGATIVE mg/dL
KETONES UR: NEGATIVE mg/dL
Nitrite: NEGATIVE
Protein, ur: NEGATIVE mg/dL
SPECIFIC GRAVITY, URINE: 1.02 (ref 1.005–1.030)
UROBILINOGEN UA: 0.2 mg/dL (ref 0.0–1.0)
pH: 5.5 (ref 5.0–8.0)

## 2016-09-03 MED ORDER — CIPROFLOXACIN HCL 500 MG PO TABS: 500.0000 mg | ORAL_TABLET | Freq: Two times a day (BID) | ORAL | 0 refills | Status: DC

## 2016-09-03 NOTE — ED Triage Notes (Signed)
Pt  Has    sev   Symptoms  She  Reports       Burning  On  Urination   Tingling  Sensation   When   She  Uses  Bathroom    She  Reports      Symptoms   Of  Blood   Sugar  Elevated   As   Well     And    l  Knee  Is   Swollen   X  1   Week      denys  Any  specefic  Injury      Had   Ruptured  Bakers  Cyst

## 2016-09-03 NOTE — ED Provider Notes (Signed)
[name]    CSN: [id number] Arrival date & time: 09/03/16  1438     History   Chief Complaint Chief Complaint  Patient presents with  . Urinary Tract Infection    HPI [name] is a 40 y.o. female.   Is a 40 year old woman who comes in with 2 weeks of urinary frequency and dysuria. 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  Patient has chronic low back pain which hasn't changed since these urinary symptoms began. She's had no nausea, vomiting, fever, or hematuria.      Past Medical History:  Diagnosis Date  . Allergic rhinitis   . Anemia   . Anxiety disorder   . Asthma   . Benign essential hypertension   . Depression   . Diabetes mellitus without complication ([place])   . Edema   . History of blood clots   . Hyperglycemia   . Lumbar radiculopathy   . Metrorrhagia   . Morbid obesity ([place])   . OSA (obstructive sleep apnea)   . Snoring     Patient Active Problem List   Diagnosis Date Noted  . Obesity, morbid ([place]) 12/24/2013  . Obesity hypoventilation syndrome ([place]) 12/24/2013  . Iron deficiency anemia 09/29/2012  . Pulmonary embolism ([place]) 09/26/2012  . SOB (shortness of breath) 09/25/2012  . Anemia 09/25/2012  . PALPITATIONS 03/10/2010  . DEPRESSION 12/09/2009  . MENORRHAGIA 12/09/2009  . ROTATOR CUFF INJURY, LEFT SHOULDER 12/09/2009  . PANIC DISORDER 07/17/2009  . LUMBAR RADICULOPATHY, LEFT 07/17/2009  . SLEEP APNEA, OBSTRUCTIVE 11/05/2008  . PERIPHERAL EDEMA 11/05/2008  . URINALYSIS, ABNORMAL 11/05/2008  . Morbid obesity ([place]) 06/09/2007  . HYPERTENSION, BENIGN ESSENTIAL 06/09/2007  . ALLERGIC RHINITIS 06/09/2007  . ASTHMA 06/09/2007  . URTICARIA 06/09/2007  . ANXIETY DISORDER, HX OF 06/09/2007  . DEPRESSION, HX OF 06/09/2007  . HYPERGLYCEMIA 06/02/2007  . CYST, VULVA 07/25/2006  . ANEMIA, IRON DEFICIENCY 12/28/2005  . ECHOCARDIOGRAM, ABNORMAL 01/25/2005  . Personal history of venous thrombosis and embolism  01/24/2005    Past Surgical History:  Procedure Laterality Date  . CESAREAN SECTION    . CHOLECYSTECTOMY    . TUBAL LIGATION      OB History    No data available       Home Medications    Prior to Admission medications   Medication Sig Start Date End Date Taking? Authorizing Provider  albuterol (PROAIR HFA) 108 (90 Base) MCG/ACT inhaler Use 2 puffs every 4 hours as needed for cough or wheeze.  May use 2 puffs 10-20 minutes prior to exercise. 02/09/16   Charlies Silvers, MD  cetirizine (ZYRTEC) 10 MG tablet Take 1 tablet (10 mg total) by mouth daily as needed. For allergies 02/02/13   Thurnell Lose, MD  ciprofloxacin (CIPRO) 500 MG tablet Take 1 tablet (500 mg total) by mouth 2 (two) times daily. 09/03/16   Robyn Haber, MD  cyclobenzaprine (FLEXERIL) 10 MG tablet Take 10 mg by mouth 3 (three) times daily as needed for muscle spasms.    Historical Provider, MD  ergocalciferol (VITAMIN D2) 50000 UNITS capsule Take 50,000 Units by mouth once a week.    Historical Provider, MD  glipiZIDE (GLUCOTROL XL) 10 MG 24 hr tablet Take 10 mg by mouth daily with breakfast.    Historical Provider, MD  ibuprofen (ADVIL,MOTRIN) 800 MG tablet Take 800 mg by mouth 2 (two) times daily. 04/02/16   Historical Provider, MD  losartan (COZAAR) 100 MG tablet Take  1 tablet (100 mg total) by mouth daily. 02/02/13   Thurnell Lose, MD  metFORMIN (GLUCOPHAGE) 1000 MG tablet Take 1,000 mg by mouth 2 (two) times daily with a meal.    Historical Provider, MD  mometasone-formoterol (DULERA) 200-5 MCG/ACT AERO Inhale 2 puffs into the lungs 2 (two) times daily. 11/11/15   Charlies Silvers, MD  montelukast (SINGULAIR) 10 MG tablet Take 10 mg by mouth at bedtime.    Historical Provider, MD    Family History Family History  Problem Relation Age of Onset  . Leukemia Father   . Diabetes Father   . Asthma Mother   . Hypertension Mother   . Breast cancer Maternal Aunt   . Breast cancer Paternal Aunt   . Diabetes Brother      Social History Social History  Substance Use Topics  . Smoking status: Never Smoker  . Smokeless tobacco: Never Used  . Alcohol use Yes     Allergies   Fish allergy; Amoxicillin; Other; Strawberry extract; Celexa [citalopram hydrobromide]; and Gabapentin   Review of Systems Review of Systems  Constitutional: Negative.   HENT: Negative.   Respiratory: Negative.   Cardiovascular: Negative.   Genitourinary: Positive for dysuria and urgency.     Physical Exam Triage Vital Signs ED Triage Vitals  Enc Vitals Group     BP 09/03/16 1512 130/62     Pulse Rate 09/03/16 1512 110     Resp 09/03/16 1512 20     Temp 09/03/16 1512 99.1 F (37.3 C)     Temp Source 09/03/16 1512 Oral     SpO2 09/03/16 1512 98 %     Weight --      Height --      Head Circumference --      Peak Flow --      Pain Score 09/03/16 1516 6     Pain Loc --      Pain Edu? --      Excl. in [place]? --    No data found.   Updated Vital Signs BP 130/62 (BP Location: Right Arm)   Pulse 110   Temp 99.1 F (37.3 C) (Oral)   Resp 20   LMP 09/03/2016   SpO2 98%   Physical Exam  Constitutional: She is oriented to person, place, and time. She appears well-developed and well-nourished.  Morbidly obese  HENT:  Head: Normocephalic.  Right Ear: External ear normal.  Left Ear: External ear normal.  Mouth/Throat: Oropharynx is clear and moist.  Eyes: Conjunctivae and EOM are normal. Pupils are equal, round, and reactive to light.  Neck: Normal range of motion. Neck supple.  Pulmonary/Chest: Effort normal.  Musculoskeletal: Normal range of motion. She exhibits no edema, tenderness or deformity.  No flank pain or tenderness  Neurological: She is alert and oriented to person, place, and time.  Skin: Skin is warm and dry.  Psychiatric: She has a normal mood and affect.  Nursing note and vitals reviewed.    UC Treatments / Results  Labs (all labs ordered are listed, but only abnormal results are  displayed) Labs Reviewed  POCT URINALYSIS DIP (DEVICE) - Abnormal; Notable for the following:       Result Value   Hgb urine dipstick TRACE (*)    Leukocytes, UA SMALL (*)    All other components within normal limits    EKG  EKG Interpretation None       Radiology No results found.  Procedures Procedures (including critical care  time)  Medications Ordered in UC Medications - No data to display   Initial Impression / Assessment and Plan / UC Course  I have reviewed the triage vital signs and the nursing notes.  Pertinent labs & imaging results that were available during my care of the patient were reviewed by me and considered in my medical decision making (see chart for details).  Clinical Course    Final Clinical Impressions(s) / UC Diagnoses   Final diagnoses:  Acute cystitis without hematuria    New Prescriptions New Prescriptions   CIPROFLOXACIN (CIPRO) 500 MG TABLET    Take 1 tablet (500 mg total) by mouth 2 (two) times daily.     Robyn Haber, MD 09/03/16 1620

## 2016-11-03 ENCOUNTER — Ambulatory Visit: Payer: Medicaid Other

## 2017-02-25 ENCOUNTER — Ambulatory Visit (HOSPITAL_COMMUNITY)
Admission: EM | Admit: 2017-02-25 | Discharge: 2017-02-25 | Disposition: A | Discharge status: Home / Self Care | Payer: Medicaid Other | Attending: Emergency Medicine | Admitting: Emergency Medicine

## 2017-02-25 ENCOUNTER — Encounter (HOSPITAL_COMMUNITY): Payer: Self-pay | Admitting: Emergency Medicine

## 2017-02-25 DIAGNOSIS — J4521 Mild intermittent asthma with (acute) exacerbation: Secondary | ICD-10-CM

## 2017-02-25 MED ORDER — PREDNISONE 10 MG PO TABS: 30.0000 mg | ORAL_TABLET | Freq: Every day | ORAL | 0 refills | Status: DC

## 2017-02-25 NOTE — ED Triage Notes (Signed)
The patient presented to the [facility] with a complaint of a cough and congestion x 3 days. 

## 2017-02-25 NOTE — Discharge Instructions (Signed)
Take prednisone as directed. Continue home meds (albuterol), keep a check on your blood sugar as prednisone will make it increase. Go immediately to Er for new or worsening issues(shortness of breath,chest pain, fever, etc). Return to UC as needed.

## 2017-02-25 NOTE — ED Provider Notes (Signed)
CSN: [id number]     Arrival date & time 02/25/17  1913 History   None    Chief Complaint  Patient presents with  . Cough   (Consider location/radiation/quality/duration/timing/severity/associated sxs/prior Treatment) The history is provided by the patient. No language interpreter was used.  Cough  Cough characteristics:  Non-productive Severity:  Mild Onset quality:  Sudden Timing:  Intermittent Progression:  Waxing and waning Chronicity:  Chronic Smoker: no   Context: weather changes   Relieved by:  Nothing Ineffective treatments:  None tried Associated symptoms: no chest pain and no wheezing     Past Medical History:  Diagnosis Date  . Allergic rhinitis   . Anemia   . Anxiety disorder   . Asthma   . Benign essential hypertension   . Depression   . Diabetes mellitus without complication ([place])   . Edema   . History of blood clots   . Hyperglycemia   . Lumbar radiculopathy   . Metrorrhagia   . Morbid obesity ([place])   . OSA (obstructive sleep apnea)   . Snoring    Past Surgical History:  Procedure Laterality Date  . CESAREAN SECTION    . CHOLECYSTECTOMY    . TUBAL LIGATION     Family History  Problem Relation Age of Onset  . Leukemia Father   . Diabetes Father   . Asthma Mother   . Hypertension Mother   . Breast cancer Maternal Aunt   . Breast cancer Paternal Aunt   . Diabetes Brother    Social History  Substance Use Topics  . Smoking status: Never Smoker  . Smokeless tobacco: Never Used  . Alcohol use Yes   OB History    No data available     Review of Systems  Respiratory: Positive for cough. Negative for chest tightness and wheezing.   Cardiovascular: Negative for chest pain, palpitations and leg swelling.  All other systems reviewed and are negative.   Allergies  Doxycycline; Fish allergy; Amoxicillin; Other; Strawberry extract; Celexa [citalopram hydrobromide]; and Gabapentin  Home Medications   Prior to Admission medications    Medication Sig Start Date End Date Taking? Authorizing Provider  albuterol (PROAIR HFA) 108 (90 Base) MCG/ACT inhaler Use 2 puffs every 4 hours as needed for cough or wheeze.  May use 2 puffs 10-20 minutes prior to exercise. 02/09/16  Yes Jose Charyl Bigger, MD  glipiZIDE (GLUCOTROL XL) 10 MG 24 hr tablet Take 10 mg by mouth daily with breakfast.   Yes Historical Provider, MD  losartan (COZAAR) 100 MG tablet Take 1 tablet (100 mg total) by mouth daily. 02/02/13  Yes Thurnell Lose, MD  metFORMIN (GLUCOPHAGE) 1000 MG tablet Take 1,000 mg by mouth 2 (two) times daily with a meal.   Yes Historical Provider, MD  mometasone-formoterol (DULERA) 200-5 MCG/ACT AERO Inhale 2 puffs into the lungs 2 (two) times daily. 11/11/15  Yes Charlies Silvers, MD  predniSONE (DELTASONE) 10 MG tablet Take 3 tablets (30 mg total) by mouth daily. 3/66/29   Tori Milks, NP   Meds Ordered and Administered this Visit  Medications - No data to display  BP (!) 159/98 (BP Location: Left Wrist)   Pulse (!) 120   Temp 98.8 F (37.1 C) (Oral)   Resp 20   SpO2 97%  No data found.   Physical Exam  Constitutional: She is oriented to person, place, and time. She appears well-developed and well-nourished. She is active and cooperative. No distress.  HENT:  Head: Normocephalic.  Right Ear: Tympanic membrane normal.  Left Ear: Tympanic membrane normal.  Nose: Mucosal edema present.  Mouth/Throat: Oropharynx is clear and moist.  Eyes: Conjunctivae, EOM and lids are normal. Pupils are equal, round, and reactive to light.  Neck: Normal range of motion. No tracheal deviation present.  Cardiovascular: Regular rhythm, normal heart sounds and normal pulses.  Tachycardia present.   No murmur heard. Pulmonary/Chest: Effort normal and breath sounds normal.  Abdominal: Soft. Bowel sounds are normal. There is no tenderness.  Musculoskeletal: Normal range of motion.  Lymphadenopathy:    She has no cervical adenopathy.   Neurological: She is alert and oriented to person, place, and time. GCS eye subscore is 4. GCS verbal subscore is 5. GCS motor subscore is 6.  Skin: Skin is warm and dry. No rash noted.  Psychiatric: She has a normal mood and affect. Her speech is normal and behavior is normal.  Nursing note and vitals reviewed.   Urgent Care Course     Procedures (including critical care time)  Labs Review Labs Reviewed - No data to display  Imaging Review No results found.        MDM   1. Mild intermittent asthma with exacerbation    Afebrile, O2 sat 98% on RA. Pt has a hx of HTN and tachycardia, advised pt if s/s worsened to go immediately to ER.Pt is  able to speak in full sentences in UC, playing on cell phone. Discussed plan of care with pt: Take prednisone as directed. Continue home meds (albuterol), keep a check on your blood sugar as prednisone will make it increase. Go immediately to Er for new or worsening issues(shortness of breath,chest pain, fever, etc). Return to UC as needed. Pt verbalized understanding to this provider.   Tori Milks, NP 09/03/14 [id number]

## 2017-08-27 ENCOUNTER — Emergency Department (HOSPITAL_COMMUNITY): Payer: Medicaid Other

## 2017-08-27 ENCOUNTER — Emergency Department (HOSPITAL_COMMUNITY)
Admission: EM | Admit: 2017-08-27 | Discharge: 2017-08-28 | Disposition: A | Discharge status: Home / Self Care | Payer: Medicaid Other | Attending: Emergency Medicine | Admitting: Emergency Medicine

## 2017-08-27 DIAGNOSIS — J45909 Unspecified asthma, uncomplicated: Secondary | ICD-10-CM | POA: Diagnosis not present

## 2017-08-27 DIAGNOSIS — R002 Palpitations: Secondary | ICD-10-CM | POA: Diagnosis present

## 2017-08-27 DIAGNOSIS — I10 Essential (primary) hypertension: Secondary | ICD-10-CM | POA: Diagnosis not present

## 2017-08-27 DIAGNOSIS — E119 Type 2 diabetes mellitus without complications: Secondary | ICD-10-CM | POA: Insufficient documentation

## 2017-08-27 DIAGNOSIS — Z7984 Long term (current) use of oral hypoglycemic drugs: Secondary | ICD-10-CM | POA: Diagnosis not present

## 2017-08-27 LAB — CBC
HEMATOCRIT: 29.8 % — AB (ref 36.0–46.0)
HEMOGLOBIN: 8.3 g/dL — AB (ref 12.0–15.0)
MCH: 15.8 pg — AB (ref 26.0–34.0)
MCHC: 27.9 g/dL — AB (ref 30.0–36.0)
MCV: 56.7 fL — AB (ref 78.0–100.0)
Platelets: 716 10*3/uL — ABNORMAL HIGH (ref 150–400)
RBC: 5.26 MIL/uL — ABNORMAL HIGH (ref 3.87–5.11)
RDW: 20.6 % — ABNORMAL HIGH (ref 11.5–15.5)
WBC: 10.8 10*3/uL — ABNORMAL HIGH (ref 4.0–10.5)

## 2017-08-27 LAB — BASIC METABOLIC PANEL
Anion gap: 11 (ref 5–15)
BUN: 10 mg/dL (ref 6–20)
CALCIUM: 9.1 mg/dL (ref 8.9–10.3)
CHLORIDE: 101 mmol/L (ref 101–111)
CO2: 23 mmol/L (ref 22–32)
Creatinine, Ser: 0.9 mg/dL (ref 0.44–1.00)
GFR calc Af Amer: >60 mL/min (ref >60)
GFR calc non Af Amer: >60 mL/min (ref >60)
GLUCOSE: 162 mg/dL — AB (ref 65–99)
Potassium: 3.9 mmol/L (ref 3.5–5.1)
Sodium: 135 mmol/L (ref 135–145)

## 2017-08-27 LAB — I-STAT TROPONIN, ED: Troponin i, poc: 0 ng/mL (ref 0.00–0.08)

## 2017-08-27 MED ORDER — IOPAMIDOL (ISOVUE-370) INJECTION 76%
INTRAVENOUS | Status: AC
  Administered 2017-08-27: 100 mL
  Filled 2017-08-27: qty 100

## 2017-08-27 NOTE — Discharge Instructions (Signed)
If you were given medicines take as directed.  If you are on coumadin or contraceptives realize their levels and effectiveness is altered by many different medicines.  If you have any reaction (rash, tongues swelling, other) to the medicines stop taking and see a physician.    If your blood pressure was elevated in the ER make sure you follow up for management with a primary doctor or return for chest pain, shortness of breath or stroke symptoms.  Please follow up as directed and return to the ER or see a physician for new or worsening symptoms.  Thank you. Vitals:   08/27/17 1742 08/27/17 1754  BP:  (!) 175/85  Pulse:  (!) 112  Resp:  14  Temp:  98.5 F (36.9 C)  TempSrc:  Oral  SpO2:  98%  Weight: (!) 211.4 kg (466 lb)   Height: 5\' 9"  (1.753 m)

## 2017-08-27 NOTE — ED Provider Notes (Signed)
[facility]PART[facility] Provider Note   CSN: [id number] Arrival date & time: 08/27/17  1738     History   Chief Complaint Chief Complaint  Patient presents with  . Palpitations    HPI [name] is a 41 y.o. female.  Patient with history of blood clots 2005 no known cause and currently not on anticoagulants, obesity, diabetes presents with palpitations.  Since last night patient had a few episodes of heart racing.  Patient had very transient right lower chest discomfort that resolved.  No cardiac history.  Patient has no pleuritic pain.  Patient says this feels different than when she had blood clots and she has no shortness of breath and no focal unilateral leg pain or swelling.  Patient did see primary doctor and was told possibly had episode of atrial flutter.      Past Medical History:  Diagnosis Date  . Allergic rhinitis   . Anemia   . Anxiety disorder   . Asthma   . Benign essential hypertension   . Depression   . Diabetes mellitus without complication ([place])   . Edema   . History of blood clots   . Hyperglycemia   . Lumbar radiculopathy   . Metrorrhagia   . Morbid obesity ([place])   . OSA (obstructive sleep apnea)   . Snoring     Patient Active Problem List   Diagnosis Date Noted  . Obesity, morbid ([place]) 12/24/2013  . Obesity hypoventilation syndrome ([place]) 12/24/2013  . Iron deficiency anemia 09/29/2012  . Pulmonary embolism ([place]) 09/26/2012  . SOB (shortness of breath) 09/25/2012  . Anemia 09/25/2012  . PALPITATIONS 03/10/2010  . DEPRESSION 12/09/2009  . MENORRHAGIA 12/09/2009  . ROTATOR CUFF INJURY, LEFT SHOULDER 12/09/2009  . PANIC DISORDER 07/17/2009  . LUMBAR RADICULOPATHY, LEFT 07/17/2009  . SLEEP APNEA, OBSTRUCTIVE 11/05/2008  . PERIPHERAL EDEMA 11/05/2008  . URINALYSIS, ABNORMAL 11/05/2008  . Morbid obesity ([place]) 06/09/2007  . HYPERTENSION, BENIGN ESSENTIAL 06/09/2007  . ALLERGIC RHINITIS 06/09/2007  . ASTHMA  06/09/2007  . URTICARIA 06/09/2007  . ANXIETY DISORDER, HX OF 06/09/2007  . DEPRESSION, HX OF 06/09/2007  . HYPERGLYCEMIA 06/02/2007  . CYST, VULVA 07/25/2006  . ANEMIA, IRON DEFICIENCY 12/28/2005  . ECHOCARDIOGRAM, ABNORMAL 01/25/2005  . Personal history of venous thrombosis and embolism 01/24/2005    Past Surgical History:  Procedure Laterality Date  . CESAREAN SECTION    . CHOLECYSTECTOMY    . TUBAL LIGATION      OB History    No data available       Home Medications    Prior to Admission medications   Medication Sig Start Date End Date Taking? Authorizing Provider  albuterol (PROAIR HFA) 108 (90 Base) MCG/ACT inhaler Use 2 puffs every 4 hours as needed for cough or wheeze.  May use 2 puffs 10-20 minutes prior to exercise. 02/09/16   Charlies Silvers, MD  glipiZIDE (GLUCOTROL XL) 10 MG 24 hr tablet Take 10 mg by mouth daily with breakfast.    [provider]  losartan (COZAAR) 100 MG tablet Take 1 tablet (100 mg total) by mouth daily. 02/02/13   Thurnell Lose, MD  metFORMIN (GLUCOPHAGE) 1000 MG tablet Take 1,000 mg by mouth 2 (two) times daily with a meal.    [provider]  mometasone-formoterol (DULERA) 200-5 MCG/ACT AERO Inhale 2 puffs into the lungs 2 (two) times daily. 11/11/15   Charlies Silvers, MD  predniSONE (DELTASONE) 10 MG tablet Take 3 tablets (  30 mg total) by mouth daily. 6/96/29   Defelice, Jeanett Schlein, NP    Family History Family History  Problem Relation Age of Onset  . Leukemia Father   . Diabetes Father   . Asthma Mother   . Hypertension Mother   . Breast cancer Maternal Aunt   . Breast cancer Paternal Aunt   . Diabetes Brother     Social History Social History  Substance Use Topics  . Smoking status: Never Smoker  . Smokeless tobacco: Never Used  . Alcohol use Yes     Allergies   Doxycycline; Fish allergy; Amoxicillin; Other; Strawberry extract; Celexa [citalopram hydrobromide]; and Gabapentin   Review of  Systems Review of Systems  Constitutional: Negative for chills and fever.  HENT: Negative for congestion.   Eyes: Negative for visual disturbance.  Respiratory: Negative for shortness of breath.   Cardiovascular: Positive for chest pain and palpitations. Negative for leg swelling.  Gastrointestinal: Negative for abdominal pain and vomiting.  Genitourinary: Negative for dysuria and flank pain.  Musculoskeletal: Negative for back pain, neck pain and neck stiffness.  Skin: Negative for rash.  Neurological: Negative for light-headedness and headaches.     Physical Exam Updated Vital Signs BP (!) 175/85 (BP Location: Right Arm)   Pulse (!) 112   Temp 98.5 F (36.9 C) (Oral)   Resp 14   Ht 5\' 9"  (1.753 m)   Wt (!) 211.4 kg (466 lb)   SpO2 98%   BMI 68.82 kg/m   Physical Exam  Constitutional: She appears well-developed and well-nourished. No distress.  HENT:  Head: Normocephalic and atraumatic.  Eyes: Conjunctivae are normal.  Neck: Neck supple.  Cardiovascular: Regular rhythm.  Tachycardia present.   No murmur heard. Pulmonary/Chest: Effort normal and breath sounds normal. No respiratory distress.  Abdominal: Soft. There is no tenderness.  Musculoskeletal: She exhibits no edema.  Neurological: She is alert.  Skin: Skin is warm and dry.  Psychiatric: She has a normal mood and affect.  Nursing note and vitals reviewed.    ED Treatments / Results  Labs (all labs ordered are listed, but only abnormal results are displayed) Labs Reviewed  BASIC METABOLIC PANEL - Abnormal; Notable for the following:       Result Value   Glucose, Bld 162 (*)    All other components within normal limits  CBC - Abnormal; Notable for the following:    WBC 10.8 (*)    RBC 5.26 (*)    Hemoglobin 8.3 (*)    HCT 29.8 (*)    MCV 56.7 (*)    MCH 15.8 (*)    MCHC 27.9 (*)    RDW 20.6 (*)    Platelets 716 (*)    All other components within normal limits  I-STAT TROPONIN, ED    EKG  EKG  Interpretation None     EKG reviewed heart rate 108, sinus, flattened T waves, no ST elevation, normal QT.  Radiology Dg Chest 2 View  Result Date: 08/27/2017 CLINICAL DATA:  Chest pain EXAM: CHEST  2 VIEW COMPARISON:  04/22/2016 FINDINGS: The heart size and mediastinal contours are within normal limits. Both lungs are clear. The visualized skeletal structures demonstrate degenerative osteophytes of the spine. IMPRESSION: No active cardiopulmonary disease. Electronically Signed   By: Donavan Foil M.D.   On: 08/27/2017 19:16    Procedures Procedures (including critical care time)  Medications Ordered in ED Medications - No data to display   Initial Impression / Assessment and Plan / ED  Course  I have reviewed the triage vital signs and the nursing notes.  Pertinent labs & imaging results that were available during my care of the patient were reviewed by me and considered in my medical decision making (see chart for details).    Patient presents with intermittent palpitations and mild sinus tachycardia.  EKG reviewed no acute findings except for the sinus tachycardia.  With blood clot history and brief atypical chest discomfort plan for CT angiogram to look for any blood clot.  Discussed outpatient follow-up with cardiology and primary doctor.  Patient feels improved in the ER. On reassessment patient smiling well-appearing no chest pain, no shortness of breath.  I have low suspicion for blood clot however CT scan indeterminate.  Plan for d-dimer if negative patient will go home and follow-up with cardiology and primary doctor.  If positive patient still wishes to go home and had a VQ scan done tomorrow or Monday during the day.  Patient's care will be signed out to follow d-dimer.  Attention was  Final Clinical Impressions(s) / ED Diagnoses   Final diagnoses:  Palpitations    New Prescriptions New Prescriptions   No medications on file     Elnora Morrison, MD 08/28/17 [phone]

## 2017-08-28 LAB — D-DIMER, QUANTITATIVE: D-Dimer, Quant: 0.46 ug/mL-FEU (ref 0.00–0.50)

## 2017-08-28 NOTE — ED Notes (Signed)
Pt understood dc material. NAD noted. Work excuse given with DC papers

## 2017-09-20 ENCOUNTER — Other Ambulatory Visit (HOSPITAL_COMMUNITY): Payer: Self-pay | Admitting: Family Medicine

## 2017-09-20 DIAGNOSIS — I4892 Unspecified atrial flutter: Secondary | ICD-10-CM

## 2017-09-28 ENCOUNTER — Ambulatory Visit (HOSPITAL_COMMUNITY)
Admission: RE | Admit: 2017-09-28 | Discharge: 2017-09-28 | Disposition: A | Discharge status: Home / Self Care | Payer: Medicaid Other | Source: Ambulatory Visit | Attending: Family Medicine | Admitting: Family Medicine

## 2017-09-28 ENCOUNTER — Encounter (HOSPITAL_COMMUNITY): Payer: Self-pay

## 2017-09-29 ENCOUNTER — Encounter (HOSPITAL_COMMUNITY): Payer: Medicaid Other | Attending: Family Medicine

## 2018-01-12 ENCOUNTER — Encounter: Payer: Self-pay | Admitting: Neurology

## 2018-01-12 ENCOUNTER — Institutional Professional Consult (permissible substitution): Payer: Self-pay | Admitting: Neurology

## 2018-02-15 ENCOUNTER — Ambulatory Visit: Payer: Medicaid Other | Admitting: Obstetrics and Gynecology

## 2018-03-01 ENCOUNTER — Ambulatory Visit: Payer: Medicaid Other | Admitting: Obstetrics and Gynecology

## 2018-03-01 ENCOUNTER — Other Ambulatory Visit (HOSPITAL_COMMUNITY)
Admission: RE | Admit: 2018-03-01 | Discharge: 2018-03-01 | Disposition: A | Discharge status: Home / Self Care | Payer: Medicaid Other | Source: Ambulatory Visit | Attending: Obstetrics and Gynecology | Admitting: Obstetrics and Gynecology

## 2018-03-01 ENCOUNTER — Encounter: Payer: Self-pay | Admitting: Obstetrics and Gynecology

## 2018-03-01 DIAGNOSIS — Z3202 Encounter for pregnancy test, result negative: Secondary | ICD-10-CM | POA: Diagnosis not present

## 2018-03-01 DIAGNOSIS — N920 Excessive and frequent menstruation with regular cycle: Secondary | ICD-10-CM | POA: Insufficient documentation

## 2018-03-01 DIAGNOSIS — B9689 Other specified bacterial agents as the cause of diseases classified elsewhere: Secondary | ICD-10-CM | POA: Diagnosis not present

## 2018-03-01 DIAGNOSIS — N760 Acute vaginitis: Secondary | ICD-10-CM | POA: Diagnosis not present

## 2018-03-01 DIAGNOSIS — D219 Benign neoplasm of connective and other soft tissue, unspecified: Secondary | ICD-10-CM

## 2018-03-01 LAB — POCT URINE PREGNANCY: PREG TEST UR: NEGATIVE

## 2018-03-01 NOTE — Patient Instructions (Signed)
Vaginal Hysterectomy A vaginal hysterectomy is a procedure to remove all or part of the uterus through a small incision in the vagina. In this procedure, your health care provider may remove your entire uterus, including the lower end (cervix). You may need a vaginal hysterectomy to treat:  Uterine fibroids.  A condition that causes the lining of the uterus to grow in other areas (endometriosis).  Problems with pelvic support.  Cancer of the cervix, ovaries, uterus, or tissue that lines the uterus (endometrium).  Excessive (dysfunctional) uterine bleeding.  When removing your uterus, your health care provider may also remove the organs that produce eggs (ovaries) and the tubes that carry eggs to your uterus (fallopian tubes). After a vaginal hysterectomy, you will no longer be able to have a baby. You will also no longer get your menstrual period. Tell a health care provider about:  Any allergies you have.  All medicines you are taking, including vitamins, herbs, eye drops, creams, and over-the-counter medicines.  Any problems you or family members have had with anesthetic medicines.  Any blood disorders you have.  Any surgeries you have had.  Any medical conditions you have.  Whether you are pregnant or may be pregnant. What are the risks? Generally, this is a safe procedure. However, problems may occur, including:  Bleeding.  Infection.  A blood clot that forms in your leg and travels to your lungs (pulmonary embolism).  Damage to surrounding organs.  Pain during sex.  What happens before the procedure?  Ask your health care provider what organs will be removed during surgery.  Ask your health care provider about: ? Changing or stopping your regular medicines. This is especially important if you are taking diabetes medicines or blood thinners. ? Taking medicines such as aspirin and ibuprofen. These medicines can thin your blood. Do not take these medicines before  your procedure if your health care provider instructs you not to.  Follow instructions from your health care provider about eating or drinking restrictions.  Do not use any tobacco products, such as cigarettes, chewing tobacco, and e-cigarettes. If you need help quitting, ask your health care provider.  Plan to have someone take you home after discharge from the hospital. What happens during the procedure?  To reduce your risk of infection: ? Your health care team will wash or sanitize their hands. ? Your skin will be washed with soap.  An IV tube will be inserted into one of your veins.  You may be given antibiotic medicine to help prevent infection.  You will be given one or more of the following: ? A medicine to help you relax (sedative). ? A medicine to numb the area (local anesthetic). ? A medicine to make you fall asleep (general anesthetic). ? A medicine that is injected into an area of your body to numb everything beyond the injection site (regional anesthetic).  Your surgeon will make an incision in your vagina.  Your surgeon will locate and remove all or part of your uterus.  Your ovaries and fallopian tubes may be removed at the same time.  The incision will be closed with stitches (sutures) that dissolve over time. The procedure may vary among health care providers and hospitals. What happens after the procedure?  Your blood pressure, heart rate, breathing rate, and blood oxygen level will be monitored often until the medicines you were given have worn off.  You will be encouraged to get up and walk around after a few hours to help prevent   complications.  You may have IV tubes in place for a few days.  You will be given pain medicine as needed.  Do not drive for 24 hours if you were given a sedative. This information is not intended to replace advice given to you by your health care provider. Make sure you discuss any questions you have with your health care  provider. Document Released: 02/09/2016 Document Revised: 03/25/2016 Document Reviewed: 11/02/2015 Elsevier Interactive Patient Education  2018 [employer]  

## 2018-03-01 NOTE — Progress Notes (Signed)
Pt presents for annual, pap, all STD testing. Pt c/o menorrhagia and referred d/t fibroids found on recent u/s. PCP recommends Hyst or Ablation.

## 2018-03-02 LAB — HEPATITIS B SURFACE ANTIGEN: Hepatitis B Surface Ag: NEGATIVE

## 2018-03-02 LAB — HEPATITIS C ANTIBODY: Hep C Virus Ab: <0.1 s/co ratio (ref 0.0–0.9)

## 2018-03-02 LAB — HIV ANTIBODY (ROUTINE TESTING W REFLEX): HIV Screen 4th Generation wRfx: NONREACTIVE

## 2018-03-02 LAB — RPR: RPR: NONREACTIVE

## 2018-03-02 NOTE — Progress Notes (Signed)
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   MRN: [id number] Ms Manternach presents in referral from [facility] for pap smear and evalution of her heavy cycles. Pt rpeorts monthly cycles, last @ 5 days, heavy, changes 2 pads q4hrs, acciednets overnight and cramps. Has tried OCP's and Depo Provera in the past but did not tolerate. U/S 2/19 uterine fibroids. H/O anemia suspected to be related to her cycles. Presently not sexual active. H/O trich in the past. Last pap unknown but no history of abnormal. C/S, GB. BTL  C/S term twins TSVD 6 # 7 oz.  H/O DVT/PE, HTN, DM. COPD, OSA and morbid obesity  PE AF BP 160/96 Lungs clear Heart RRR Breast sym supple no masses, nipple discharge or adenopathy abd soft + BS GU nl EGBUS cervix no lesion, uterus small mobile no masses, limited by pt habitus  ENDOMETRIAL BIOPSY     The indications for endometrial biopsy were reviewed.   Risks of the biopsy including cramping, bleeding, infection, uterine perforation, inadequate specimen and need for additional procedures  were discussed. The patient states she understands and agrees to undergo procedure today. Consent was signed. Time out was performed. Urine HCG was negative. During the pelvic exam, the cervix was prepped with Betadine. A single-toothed tenaculum was placed on the anterior lip of the cervix to stabilize it. The 3 mm pipelle was introduced into the endometrial cavity without difficulty to a depth of 9 cm, and a moderate amount of tissue was obtained and sent to pathology. The instruments were removed from the patient's vagina. Minimal bleeding from the cervix was noted. The patient tolerated the procedure well. Routine post-procedure instructions were given to the patient.     A/P  Yearly GYN exam, pap smear today  STD testing per pt request  Heavy cycles  Uterine fibroids  Chronic medical problems as listed.  Management options reviewed with pt. Unfortunately due to  H/O DVT/PE, not a canidate for medical treatment. EMBX completed d/t to morbid obesity. Pt desires definite therapy. TVH with BS reviewed with pt. Await EMBX results. If normal will need pre op clearance form PCP and anesthesia evaluation as well. Discussed need for anticoagulation therapy with pt d/t H/O DVT /PE. R/B/post op care reviewed. Pt will be contacted with [facility] and then will send letter to PCP for clearance.

## 2018-03-03 LAB — CYTOLOGY - PAP
BACTERIAL VAGINITIS: POSITIVE — AB
CANDIDA VAGINITIS: NEGATIVE
Chlamydia: NEGATIVE
Diagnosis: NEGATIVE
Diagnosis: REACTIVE
HPV (WINDOPATH): NOT DETECTED
Neisseria Gonorrhea: NEGATIVE
TRICH (WINDOWPATH): NEGATIVE

## 2018-03-07 ENCOUNTER — Telehealth: Payer: Self-pay

## 2018-03-07 MED ORDER — METRONIDAZOLE 500 MG PO TABS: 500.0000 mg | ORAL_TABLET | Freq: Two times a day (BID) | ORAL | 0 refills | Status: DC

## 2018-03-07 NOTE — Telephone Encounter (Signed)
Advised pt of results, rx sent, and advised to follow up with PCP for pre-op clearance. Pt agreed.

## 2018-10-20 ENCOUNTER — Telehealth: Payer: Self-pay | Admitting: Hematology and Oncology

## 2018-10-20 ENCOUNTER — Encounter: Payer: Self-pay | Admitting: Hematology and Oncology

## 2018-10-20 NOTE — Telephone Encounter (Signed)
New hem referral received from [facility] for IDA. Pt has been cld and scheduled to see Dr. Lindi Adie on 1/15 at 1pm. Pt aware to arrive 30 minutes early. Letter mailed.

## 2018-11-14 NOTE — Progress Notes (Signed)
[place] NOTE  Patient Care Team: [facility], [place] as PCP - General Velvet Bathe, MD (Inactive) as Referring Physician (Family Medicine)  CHIEF COMPLAINTS/PURPOSE OF CONSULTATION: Iron deficiency anemia  HISTORY OF PRESENTING ILLNESS:  [name] 43 y.o. female is here because of recent diagnosis of iron deficiency anemia. She was referred from [facility]. Her anemia began about 1 year ago after heavy menstrual bleeding and was marked with fatigue. She was prescribed iron supplements, but they were not covered by her insurance so she was unable to pick them up from the pharmacy. She has a history of Type 2 diabetes.   She presents to the clinic today alone. She notes she thinks she has been anemic her entire life. 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 She has the sickle cell trait, as does everyone in her family, and it was first addressed when she had unexplained fatigue, thirst, and pain in her joints. She has been on iron supplements previously, which were ineffective. She has not had a colonoscopy or upper endoscopy. She has two fibroid tumors. She menstruates regularly and about every third cycle she has an extremely heavy flow. This heavy flow lasts 5 days, necessitates two tampons at one time, and is so heavy that she wakes up in the night covered in blood. Her menstrual cycles lead to extreme fatigue, body aches, migraines and a loss of appetite. She has regular cravings for ice. She has a history of cholecystectomy and c-section. She has three children. She desires a gastric bypass surgery, but was told she needed to be seen here first. She is looking to get her fibroids removed because they cause her pain that is a 6-7 out of 10.   I reviewed her records extensively and collaborated the history with the patient.  REVIEW OF SYSTEMS:   Constitutional: Denies fevers, chills or abnormal night sweats (+) cravings for ice (+)  fatigue Eyes: Denies blurriness of vision, double vision or watery eyes Ears, nose, mouth, throat, and face: Denies mucositis or sore throat Respiratory: Denies cough, dyspnea or wheezes Cardiovascular: Denies palpitation, chest discomfort or lower extremity swelling Gastrointestinal:  Denies nausea, heartburn or change in bowel habits Skin: Denies abnormal skin rashes GYN:(+) heavy menstruation  Lymphatics: Denies new lymphadenopathy or easy bruising Neurological: Denies numbness, tingling or new weaknesses Behavioral/Psych: Mood is stable, no new changes  All other systems were reviewed with the patient and are negative.  MEDICAL HISTORY:  Past Medical History:  Diagnosis Date  . Allergic rhinitis   . Anemia   . Anxiety disorder   . Asthma   . Benign essential hypertension   . Depression   . Diabetes mellitus without complication ([place])   . Edema   . History of blood clots   . Hyperglycemia   . Lumbar radiculopathy   . Metrorrhagia   . Morbid obesity ([place])   . OSA (obstructive sleep apnea)   . Sickle cell trait ([place])   . Snoring     SURGICAL HISTORY: Past Surgical History:  Procedure Laterality Date  . CESAREAN SECTION    . CHOLECYSTECTOMY    . TUBAL LIGATION      SOCIAL HISTORY: Social History   Socioeconomic History  . Marital status: Single    Spouse name: Not on file  . Number of children: 3  . Years of education: 31  . Highest education level: Not on file  Occupational History  . Not on file  Social Needs  . Financial resource strain: Not on file  . Food insecurity:    Worry: Not on file    Inability: Not on file  . Transportation needs:    Medical: Not on file    Non-medical: Not on file  Tobacco Use  . Smoking status: Never Smoker  . Smokeless tobacco: Never Used  Substance and Sexual Activity  . Alcohol use: Yes  . Drug use: No  . Sexual activity: Yes    Partners: Male    Birth control/protection: Surgical  Lifestyle  . Physical  activity:    Days per week: Not on file    Minutes per session: Not on file  . Stress: Not on file  Relationships  . Social connections:    Talks on phone: Not on file    Gets together: Not on file    Attends religious service: Not on file    Active member of club or organization: Not on file    Attends meetings of clubs or organizations: Not on file    Relationship status: Not on file  . Intimate partner violence:    Fear of current or ex partner: Not on file    Emotionally abused: Not on file    Physically abused: Not on file    Forced sexual activity: Not on file  Other Topics Concern  . Not on file  Social History Narrative   Patient is single and lives with her children.   Patient has three children.   Patient is currently unemployed.   Patient is a Equities trader in college now.   Patient is right-handed.   Patient drinks one cup of tea every other day.    FAMILY HISTORY: Family History  Problem Relation Age of Onset  . Leukemia Father   . Diabetes Father   . Asthma Mother   . Hypertension Mother   . Breast cancer Maternal Aunt   . Breast cancer Paternal Aunt   . Diabetes Brother     ALLERGIES:  is allergic to doxycycline; fish allergy; amoxicillin; other; strawberry extract; celexa [citalopram hydrobromide]; and gabapentin.  MEDICATIONS:  Current Outpatient Medications  Medication Sig Dispense Refill  . albuterol (PROAIR HFA) 108 (90 Base) MCG/ACT inhaler Use 2 puffs every 4 hours as needed for cough or wheeze.  May use 2 puffs 10-20 minutes prior to exercise. 1 Inhaler 0  . cetirizine (ZYRTEC) 10 MG tablet Take 10 mg by mouth daily.    . cyclobenzaprine (FLEXERIL) 10 MG tablet Take 10 mg by mouth 3 (three) times daily.    Marland Kitchen glipiZIDE (GLUCOTROL XL) 10 MG 24 hr tablet Take 10 mg by mouth daily with breakfast.    . HYDROcodone-acetaminophen (NORCO) 10-325 MG tablet TK 1 T PO BID PRN  0  . ibuprofen (ADVIL,MOTRIN) 800 MG tablet Take 800 mg by mouth 2 (two) times daily.     Marland Kitchen losartan (COZAAR) 100 MG tablet Take 1 tablet (100 mg total) by mouth daily. 30 tablet 3  . metFORMIN (GLUCOPHAGE) 1000 MG tablet Take 1,000 mg by mouth 2 (two) times daily with a meal.    . metroNIDAZOLE (FLAGYL) 500 MG tablet Take 1 tablet (500 mg total) by mouth 2 (two) times daily. 14 tablet 0  . mometasone-formoterol (DULERA) 200-5 MCG/ACT AERO Inhale 2 puffs into the lungs 2 (two) times daily. 1 Inhaler 0  . montelukast (SINGULAIR) 10 MG tablet Take 10 mg by mouth at bedtime.     No current facility-administered medications for this  visit.    PHYSICAL EXAMINATION: ECOG PERFORMANCE STATUS: 1 - Symptomatic but completely ambulatory  Vitals:   11/15/18 1305  BP: (!) 163/91  Pulse: 100  Resp: 20  Temp: 98.6 F (37 C)  SpO2: 100%   Filed Weights   11/15/18 1305  Weight: (!) 447 lb 6.4 oz (202.9 kg)    GENERAL:alert, no distress and comfortable SKIN: skin color, texture, turgor are normal, no rashes or significant lesions EYES: normal, conjunctiva are pink and non-injected, sclera clear OROPHARYNX:no exudate, no erythema and lips, buccal mucosa, and tongue normal  NECK: supple, thyroid normal size, non-tender, without nodularity LYMPH:  no palpable lymphadenopathy in the cervical, axillary or inguinal LUNGS: clear to auscultation and percussion with normal breathing effort HEART: regular rate & rhythm and no murmurs and no lower extremity edema ABDOMEN:abdomen soft, non-tender and normal bowel sounds Musculoskeletal:no cyanosis of digits and no clubbing  PSYCH: alert & oriented x 3 with fluent speech NEURO: no focal motor/sensory deficits  LABORATORY DATA:  I have reviewed the data as listed Lab Results  Component Value Date   WBC 10.8 (H) 08/27/2017   HGB 8.3 (L) 08/27/2017   HCT 29.8 (L) 08/27/2017   MCV 56.7 (L) 08/27/2017   PLT 716 (H) 08/27/2017   Lab Results  Component Value Date   NA 135 08/27/2017   K 3.9 08/27/2017   CL 101 08/27/2017   CO2 23  08/27/2017    RADIOGRAPHIC STUDIES: I have personally reviewed the radiological reports and agreed with the findings in the report.  ASSESSMENT AND PLAN:  ANEMIA, IRON DEFICIENCY Most likely cause: Uterine bleeding from fibroids plus malabsorption IV iron treatment: 09/28/2012 Longstanding history of iron deficiency (all her life)  Blood work review 09/03/2018: Hemoglobin 9.8, MCV 61, platelet 663 Differential diagnosis: Iron deficiency plus/minus thalassemia Recommendation: 1.  Check iron studies and ferritin 2.  Check hemoglobin electrophoresis I suspect that the patient will be iron deficient and will require IV iron treatment.  Patient will need a gastroenterology consultation for the microcytic anemia. We discussed the different cause of iron deficiency including bleeding and malabsorption. Return to clinic for IV iron treatment if she is found to be low on the iron studies being done today.  We will recheck her in 3 months to assess the response to IV iron therapy.. Patient plans to undergo gastric bypass surgery in the future.  And she might need IV iron therapy periodically. She is also checking in to see if she can have the fibroids removed.   All questions were answered. The patient knows to call the clinic with any problems, questions or concerns.   Nicholas Lose, MD 11/15/2018   I, Cloyde Reams Dorshimer, am acting as scribe for Nicholas Lose, MD.  I have reviewed the above documentation for accuracy and completeness, and I agree with the above.

## 2018-11-15 ENCOUNTER — Inpatient Hospital Stay: Payer: Medicaid Other | Attending: Hematology and Oncology | Admitting: Hematology and Oncology

## 2018-11-15 ENCOUNTER — Inpatient Hospital Stay: Payer: Medicaid Other

## 2018-11-15 DIAGNOSIS — F329 Major depressive disorder, single episode, unspecified: Secondary | ICD-10-CM | POA: Insufficient documentation

## 2018-11-15 DIAGNOSIS — Z7984 Long term (current) use of oral hypoglycemic drugs: Secondary | ICD-10-CM | POA: Insufficient documentation

## 2018-11-15 DIAGNOSIS — D500 Iron deficiency anemia secondary to blood loss (chronic): Secondary | ICD-10-CM

## 2018-11-15 DIAGNOSIS — E669 Obesity, unspecified: Secondary | ICD-10-CM | POA: Diagnosis not present

## 2018-11-15 DIAGNOSIS — I10 Essential (primary) hypertension: Secondary | ICD-10-CM | POA: Insufficient documentation

## 2018-11-15 DIAGNOSIS — N920 Excessive and frequent menstruation with regular cycle: Secondary | ICD-10-CM | POA: Insufficient documentation

## 2018-11-15 DIAGNOSIS — E1165 Type 2 diabetes mellitus with hyperglycemia: Secondary | ICD-10-CM | POA: Diagnosis not present

## 2018-11-15 DIAGNOSIS — R5383 Other fatigue: Secondary | ICD-10-CM | POA: Insufficient documentation

## 2018-11-15 DIAGNOSIS — D509 Iron deficiency anemia, unspecified: Secondary | ICD-10-CM | POA: Insufficient documentation

## 2018-11-15 DIAGNOSIS — Z803 Family history of malignant neoplasm of breast: Secondary | ICD-10-CM | POA: Insufficient documentation

## 2018-11-15 DIAGNOSIS — G4730 Sleep apnea, unspecified: Secondary | ICD-10-CM | POA: Diagnosis not present

## 2018-11-15 DIAGNOSIS — Z806 Family history of leukemia: Secondary | ICD-10-CM | POA: Insufficient documentation

## 2018-11-15 DIAGNOSIS — Z79899 Other long term (current) drug therapy: Secondary | ICD-10-CM | POA: Diagnosis not present

## 2018-11-15 DIAGNOSIS — J45909 Unspecified asthma, uncomplicated: Secondary | ICD-10-CM | POA: Diagnosis not present

## 2018-11-15 DIAGNOSIS — D573 Sickle-cell trait: Secondary | ICD-10-CM | POA: Insufficient documentation

## 2018-11-15 DIAGNOSIS — D259 Leiomyoma of uterus, unspecified: Secondary | ICD-10-CM | POA: Diagnosis not present

## 2018-11-15 LAB — CBC WITH DIFFERENTIAL (CANCER CENTER ONLY)
ABS IMMATURE GRANULOCYTES: 0.02 10*3/uL (ref 0.00–0.07)
Basophils Absolute: 0 10*3/uL (ref 0.0–0.1)
Basophils Relative: 0 %
Eosinophils Absolute: 0.1 10*3/uL (ref 0.0–0.5)
Eosinophils Relative: 1 %
HCT: 34.7 % — ABNORMAL LOW (ref 36.0–46.0)
Hemoglobin: 10.3 g/dL — ABNORMAL LOW (ref 12.0–15.0)
Immature Granulocytes: 0 %
LYMPHS PCT: 29 %
Lymphs Abs: 2.3 10*3/uL (ref 0.7–4.0)
MCH: 19.3 pg — AB (ref 26.0–34.0)
MCHC: 29.7 g/dL — AB (ref 30.0–36.0)
MCV: 65 fL — ABNORMAL LOW (ref 80.0–100.0)
MONO ABS: 0.4 10*3/uL (ref 0.1–1.0)
Monocytes Relative: 5 %
NEUTROS ABS: 5.3 10*3/uL (ref 1.7–7.7)
NRBC: 0 % (ref 0.0–0.2)
Neutrophils Relative %: 65 %
PLATELETS: 621 10*3/uL — AB (ref 150–400)
RBC: 5.34 MIL/uL — AB (ref 3.87–5.11)
RDW: 22.9 % — ABNORMAL HIGH (ref 11.5–15.5)
WBC: 8.1 10*3/uL (ref 4.0–10.5)

## 2018-11-15 NOTE — Assessment & Plan Note (Signed)
Most likely cause: Uterine bleeding from fibroids plus malabsorption IV iron treatment: 09/28/2012 Longstanding history of iron deficiency (all her life)  Blood work review 09/03/2018: Hemoglobin 9.8, MCV 61, platelet 663 Differential diagnosis: Iron deficiency plus/minus thalassemia Recommendation: 1.  Check iron studies and ferritin 2.  Check hemoglobin electrophoresis I suspect that the patient will be iron deficient and will require IV iron treatment.  We discussed the different cause of iron deficiency including bleeding and malabsorption. Return to clinic for IV iron treatment if she is found to be low on the iron studies being done today.  We will recheck her in 3 months to assess the response to IV iron therapy.. Patient plans to undergo gastric bypass surgery in the future.  And she might need IV iron therapy periodically. She is also checking in to see if she can have the fibroids removed.

## 2018-11-16 LAB — HEMOGLOBINOPATHY EVALUATION
HGB A2 QUANT: 3.1 % (ref 1.8–3.2)
HGB F QUANT: 0 % (ref 0.0–2.0)
HGB S QUANTITAION: 32.6 % — AB
Hgb A: 64.3 % — ABNORMAL LOW (ref 96.4–98.8)
Hgb C: 0 %
Hgb Variant: 0 %

## 2018-11-16 LAB — IRON AND TIBC
Iron: 14 ug/dL — ABNORMAL LOW (ref 41–142)
Saturation Ratios: 4 % — ABNORMAL LOW (ref 21–57)
TIBC: 336 ug/dL (ref 236–444)
UIBC: 322 ug/dL (ref 120–384)

## 2018-11-16 LAB — FERRITIN: FERRITIN: 9 ng/mL — AB (ref 11–307)

## 2018-11-23 ENCOUNTER — Telehealth: Payer: Self-pay | Admitting: Hematology and Oncology

## 2018-11-23 NOTE — Telephone Encounter (Signed)
Scheduled appt per 1/17 sch message - per patient availability. Pt is aware of appt date and time

## 2018-11-28 ENCOUNTER — Other Ambulatory Visit: Payer: Self-pay | Admitting: Hematology and Oncology

## 2018-11-29 ENCOUNTER — Inpatient Hospital Stay: Payer: Medicaid Other

## 2018-11-29 VITALS — BP 106/65 | HR 100 | Temp 98.5°F | Resp 18

## 2018-11-29 DIAGNOSIS — D500 Iron deficiency anemia secondary to blood loss (chronic): Secondary | ICD-10-CM

## 2018-11-29 DIAGNOSIS — D509 Iron deficiency anemia, unspecified: Secondary | ICD-10-CM | POA: Diagnosis not present

## 2018-11-29 MED ORDER — SODIUM CHLORIDE 0.9 % IV SOLN
510.0000 mg | Freq: Once | INTRAVENOUS | Status: AC
  Administered 2018-11-29: 510 mg via INTRAVENOUS
  Filled 2018-11-29: qty 17

## 2018-11-29 MED ORDER — SODIUM CHLORIDE 0.9 % IV SOLN
Freq: Once | INTRAVENOUS | Status: AC
  Administered 2018-11-29: 14:00:00 via INTRAVENOUS
  Filled 2018-11-29: qty 250

## 2018-11-29 NOTE — Patient Instructions (Signed)

## 2018-12-06 ENCOUNTER — Inpatient Hospital Stay: Payer: Medicaid Other | Attending: Hematology and Oncology

## 2018-12-06 VITALS — BP 132/72 | HR 92 | Temp 98.5°F | Resp 18

## 2018-12-06 DIAGNOSIS — D500 Iron deficiency anemia secondary to blood loss (chronic): Secondary | ICD-10-CM | POA: Insufficient documentation

## 2018-12-06 DIAGNOSIS — N920 Excessive and frequent menstruation with regular cycle: Secondary | ICD-10-CM | POA: Insufficient documentation

## 2018-12-06 MED ORDER — SODIUM CHLORIDE 0.9 % IV SOLN
Freq: Once | INTRAVENOUS | Status: AC
  Administered 2018-12-06: 15:00:00 via INTRAVENOUS
  Filled 2018-12-06: qty 250

## 2018-12-06 MED ORDER — SODIUM CHLORIDE 0.9 % IV SOLN
510.0000 mg | Freq: Once | INTRAVENOUS | Status: AC
  Administered 2018-12-06: 510 mg via INTRAVENOUS
  Filled 2018-12-06: qty 17

## 2018-12-06 NOTE — Patient Instructions (Signed)

## 2019-01-24 ENCOUNTER — Telehealth: Payer: Self-pay | Admitting: Oncology

## 2019-01-24 NOTE — Telephone Encounter (Signed)
[facility] 4/29 moved f/u to AM. Confirmed with patient.

## 2019-02-21 NOTE — Assessment & Plan Note (Signed)
Most likely cause: Uterine bleeding from fibroids plus malabsorption IV iron treatment: 09/28/2012 Longstanding history of iron deficiency (all her life)  Blood work review  09/03/2018: Hemoglobin 9.8, MCV 61, platelet 663 02/26/2019:  IV iron treatment: 11/29/2018 Patient plans to undergo gastric bypass surgery in the future.  And she might need IV iron therapy periodically. She is also checking in to see if she can have the fibroids removed. Return to clinic in 3 months with labs done ahead of time and follow-up.

## 2019-02-26 ENCOUNTER — Other Ambulatory Visit: Payer: Self-pay

## 2019-02-26 ENCOUNTER — Inpatient Hospital Stay: Payer: Medicaid Other | Attending: Hematology and Oncology

## 2019-02-26 ENCOUNTER — Telehealth: Payer: Self-pay | Admitting: Hematology and Oncology

## 2019-02-26 DIAGNOSIS — D500 Iron deficiency anemia secondary to blood loss (chronic): Secondary | ICD-10-CM | POA: Diagnosis present

## 2019-02-26 LAB — IRON AND TIBC
Iron: 38 ug/dL — ABNORMAL LOW (ref 41–142)
Saturation Ratios: 12 % — ABNORMAL LOW (ref 21–57)
TIBC: 330 ug/dL (ref 236–444)
UIBC: 292 ug/dL (ref 120–384)

## 2019-02-26 LAB — CBC WITH DIFFERENTIAL (CANCER CENTER ONLY)
Abs Immature Granulocytes: 0.04 10*3/uL (ref 0.00–0.07)
Basophils Absolute: 0 10*3/uL (ref 0.0–0.1)
Basophils Relative: 0 %
Eosinophils Absolute: 0.1 10*3/uL (ref 0.0–0.5)
Eosinophils Relative: 1 %
HCT: 38.3 % (ref 36.0–46.0)
Hemoglobin: 12 g/dL (ref 12.0–15.0)
Immature Granulocytes: 0 %
Lymphocytes Relative: 28 %
Lymphs Abs: 2.9 10*3/uL (ref 0.7–4.0)
MCH: 22.9 pg — ABNORMAL LOW (ref 26.0–34.0)
MCHC: 31.3 g/dL (ref 30.0–36.0)
MCV: 73.1 fL — ABNORMAL LOW (ref 80.0–100.0)
Monocytes Absolute: 0.5 10*3/uL (ref 0.1–1.0)
Monocytes Relative: 5 %
Neutro Abs: 6.8 10*3/uL (ref 1.7–7.7)
Neutrophils Relative %: 66 %
Platelet Count: 484 10*3/uL — ABNORMAL HIGH (ref 150–400)
RBC: 5.24 MIL/uL — ABNORMAL HIGH (ref 3.87–5.11)
RDW: 19.6 % — ABNORMAL HIGH (ref 11.5–15.5)
WBC Count: 10.3 10*3/uL (ref 4.0–10.5)
nRBC: 0 % (ref 0.0–0.2)

## 2019-02-26 LAB — FERRITIN: Ferritin: 35 ng/mL (ref 11–307)

## 2019-02-26 NOTE — Telephone Encounter (Signed)
Called regarding upcoming Webex appointment, patient is notified and e-mail was sent.

## 2019-02-27 NOTE — Progress Notes (Signed)
HEMATOLOGY-ONCOLOGY [place] VISIT PROGRESS NOTE  I connected with [name] on 02/28/2019 at  9:30 AM EDT by Webex video conference and verified that I am speaking with the correct person using two identifiers.  I discussed the limitations, risks, security and privacy concerns of performing an evaluation and management service by Webex and the availability of in person appointments.  I also discussed with the patient that there may be a patient responsible charge related to this service. The patient expressed understanding and agreed to proceed.   Patient's Location: Home Physician Location: Clinic  CHIEF COMPLIANT: Follow-up of iron deficiency anemia  INTERVAL HISTORY: [name] is a 43 y.o. female with above-mentioned history of iron deficiency anemia who received two doses of IV iron on 11/29/18 and 12/06/18. Her lab work from yesterday showed: Hg 12.0, MCV 73.1, RDW 19.6, iron saturation 12%, ferritin 35. She presents today over Webex for 27-month follow-up to review her labs and decide if future IV iron treatment is needed.  She is complaining of leg cramps but otherwise feeling well.  REVIEW OF SYSTEMS:   Constitutional: Denies fevers, chills or abnormal weight loss Eyes: Denies blurriness of vision Ears, nose, mouth, throat, and face: Denies mucositis or sore throat Respiratory: Denies cough, dyspnea or wheezes Cardiovascular: Denies palpitation, chest discomfort Gastrointestinal:  Denies nausea, heartburn or change in bowel habits Skin: Denies abnormal skin rashes Lymphatics: Denies new lymphadenopathy or easy bruising Neurological:Denies numbness, tingling or new weaknesses Behavioral/Psych: Mood is stable, no new changes  Extremities: No lower extremity edema Breast: denies any pain or lumps or nodules in either breasts All other systems were reviewed with the patient and are negative.  Observations/Objective:  There were no vitals filed for this visit. There is no height or  weight on file to calculate BMI.  I have reviewed the data as listed CMP Latest Ref Rng & Units 08/27/2017 04/22/2016 11/24/2012  Glucose 65 - 99 mg/dL 162(H) 115(H) 104(H)  BUN 6 - 20 mg/dL 10 11 14   Creatinine 0.44 - 1.00 mg/dL 0.90 0.80 0.97  Sodium 135 - 145 mmol/L 135 136 137  Potassium 3.5 - 5.1 mmol/L 3.9 5.2(H) 4.3  Chloride 101 - 111 mmol/L 101 103 101  CO2 22 - 32 mmol/L 23 28 27   Calcium 8.9 - 10.3 mg/dL 9.1 8.6(L) 9.3  Total Protein 6.0 - 8.3 g/dL - - 8.0  Total Bilirubin 0.3 - 1.2 mg/dL - - 0.2(L)  Alkaline Phos 39 - 117 U/L - - 97  AST 0 - 37 U/L - - 10  ALT 0 - 35 U/L - - 8    Lab Results  Component Value Date   WBC 10.3 02/26/2019   HGB 12.0 02/26/2019   HCT 38.3 02/26/2019   MCV 73.1 (L) 02/26/2019   PLT 484 (H) 02/26/2019   NEUTROABS 6.8 02/26/2019      Assessment Plan:  Iron deficiency anemia Most likely cause: Uterine bleeding from fibroids plus malabsorption IV iron treatment: 09/28/2012, January 2020 Longstanding history of iron deficiency (all her life)  Blood work review  09/03/2018: Hemoglobin 9.8, MCV 61, platelet 663 02/26/2019: Iron saturation 12%, ferritin 35 improved from 9, hemoglobin 12 improved from 10.3  IV iron treatment: 11/29/2018 Patient plans to undergo gastric bypass surgery in the future.  And she might need IV iron therapy periodically. She is also checking in to see if she can have the fibroids removed. Return to clinic in 3 months with labs done ahead of time and follow-up.  I discussed the assessment and treatment plan with the patient. The patient was provided an opportunity to ask questions and all were answered. The patient agreed with the plan and demonstrated an understanding of the instructions. The patient was advised to call back or seek an in-person evaluation if the symptoms worsen or if the condition fails to improve as anticipated.   I provided 15 minutes of face-to-face Web Ex time during this encounter.    Rulon Eisenmenger, MD 02/28/2019    I, Molly Dorshimer, am acting as scribe for Nicholas Lose, MD.  I have reviewed the above documentation for accuracy and completeness, and I agree with the above.

## 2019-02-28 ENCOUNTER — Inpatient Hospital Stay (HOSPITAL_BASED_OUTPATIENT_CLINIC_OR_DEPARTMENT_OTHER): Payer: Medicaid Other | Admitting: Hematology and Oncology

## 2019-02-28 DIAGNOSIS — D509 Iron deficiency anemia, unspecified: Secondary | ICD-10-CM

## 2019-03-01 ENCOUNTER — Telehealth: Payer: Self-pay | Admitting: Hematology and Oncology

## 2019-03-01 NOTE — Telephone Encounter (Signed)
Tried to reach regarding schedule °

## 2019-04-27 ENCOUNTER — Other Ambulatory Visit: Payer: Self-pay | Admitting: Physician Assistant

## 2019-05-25 ENCOUNTER — Telehealth: Payer: Self-pay | Admitting: Hematology and Oncology

## 2019-05-25 NOTE — Telephone Encounter (Signed)
I talk with patient regarding reschedule °

## 2019-05-25 NOTE — Assessment & Plan Note (Deleted)
Most likely cause: Uterine bleeding from fibroids plus malabsorption IV iron treatment: 09/28/2012, January 2020 Longstanding history of iron deficiency(all her life)  Blood work review 05/28/2019:  Previous IV iron treatment: January 2020  Patient plans to undergo gastric bypass surgery in the future. And she might need IV iron therapy periodically. She is also checking in to see if she can have the fibroids removed. Return to clinic in 3 months with labs done ahead of time and follow-up.

## 2019-05-28 ENCOUNTER — Inpatient Hospital Stay: Payer: Medicaid Other

## 2019-05-30 ENCOUNTER — Ambulatory Visit: Payer: Medicaid Other | Admitting: Hematology and Oncology

## 2019-06-04 NOTE — Assessment & Plan Note (Signed)
Most likely cause: Uterine bleeding from fibroids plus malabsorption IV iron treatment: 09/28/2012, January 2020 Longstanding history of iron deficiency(all her life)  Blood work review  09/03/2018: Hemoglobin 9.8, MCV 61, platelet 663 02/26/2019: Iron saturation 12%, ferritin 35 improved from 9, hemoglobin 12 improved from 10.3 06/07/2019:  Patient plans to undergo gastric bypass surgery in the future. And she might need IV iron therapy periodically. She is also checking in to see if she can have the fibroids removed.

## 2019-06-07 ENCOUNTER — Inpatient Hospital Stay: Payer: Medicaid Other | Attending: Hematology and Oncology

## 2019-06-10 NOTE — Progress Notes (Signed)
I called and left a message for the patient to call us back and schedule an appointment for labs and follow-up to see me. She missed doing her lab work as well. This encounter was created in error - please disregard.

## 2019-06-11 ENCOUNTER — Telehealth: Payer: Self-pay | Admitting: Hematology and Oncology

## 2019-06-11 ENCOUNTER — Inpatient Hospital Stay: Payer: Medicaid Other | Admitting: Hematology and Oncology

## 2019-06-11 DIAGNOSIS — D509 Iron deficiency anemia, unspecified: Secondary | ICD-10-CM

## 2019-06-11 NOTE — Telephone Encounter (Signed)
No 8/10 los, schedule message, orders, referrals at time of check out.

## 2019-07-27 ENCOUNTER — Telehealth: Payer: Self-pay | Admitting: Hematology and Oncology

## 2019-07-27 NOTE — Telephone Encounter (Signed)
Returned patient's phone call regarding scheduling an appointment, left a voicemail. 

## 2019-08-24 ENCOUNTER — Telehealth: Payer: Self-pay | Admitting: Hematology and Oncology

## 2019-08-24 NOTE — Telephone Encounter (Signed)
Scheduled appt per 10/21 sch message - unable to reach pt . Left message with appt date and time

## 2019-08-27 ENCOUNTER — Inpatient Hospital Stay: Payer: Medicaid Other | Attending: Hematology and Oncology

## 2019-08-27 ENCOUNTER — Other Ambulatory Visit: Payer: Self-pay

## 2019-08-27 DIAGNOSIS — Z79899 Other long term (current) drug therapy: Secondary | ICD-10-CM | POA: Diagnosis not present

## 2019-08-27 DIAGNOSIS — D509 Iron deficiency anemia, unspecified: Secondary | ICD-10-CM | POA: Diagnosis not present

## 2019-08-27 DIAGNOSIS — Z791 Long term (current) use of non-steroidal anti-inflammatories (NSAID): Secondary | ICD-10-CM | POA: Diagnosis not present

## 2019-08-27 DIAGNOSIS — Z7984 Long term (current) use of oral hypoglycemic drugs: Secondary | ICD-10-CM | POA: Diagnosis not present

## 2019-08-27 DIAGNOSIS — Z7951 Long term (current) use of inhaled steroids: Secondary | ICD-10-CM | POA: Diagnosis not present

## 2019-08-27 LAB — CBC WITH DIFFERENTIAL (CANCER CENTER ONLY)
Abs Immature Granulocytes: 0.03 10*3/uL (ref 0.00–0.07)
Basophils Absolute: 0 10*3/uL (ref 0.0–0.1)
Basophils Relative: 0 %
Eosinophils Absolute: 0.1 10*3/uL (ref 0.0–0.5)
Eosinophils Relative: 1 %
HCT: 36.3 % (ref 36.0–46.0)
Hemoglobin: 11 g/dL — ABNORMAL LOW (ref 12.0–15.0)
Immature Granulocytes: 0 %
Lymphocytes Relative: 21 %
Lymphs Abs: 2 10*3/uL (ref 0.7–4.0)
MCH: 20 pg — ABNORMAL LOW (ref 26.0–34.0)
MCHC: 30.3 g/dL (ref 30.0–36.0)
MCV: 66 fL — ABNORMAL LOW (ref 80.0–100.0)
Monocytes Absolute: 0.4 10*3/uL (ref 0.1–1.0)
Monocytes Relative: 5 %
Neutro Abs: 6.9 10*3/uL (ref 1.7–7.7)
Neutrophils Relative %: 73 %
Platelet Count: 592 10*3/uL — ABNORMAL HIGH (ref 150–400)
RBC: 5.5 MIL/uL — ABNORMAL HIGH (ref 3.87–5.11)
RDW: 19.4 % — ABNORMAL HIGH (ref 11.5–15.5)
WBC Count: 9.6 10*3/uL (ref 4.0–10.5)
nRBC: 0 % (ref 0.0–0.2)

## 2019-08-28 LAB — FERRITIN: Ferritin: 28 ng/mL (ref 11–307)

## 2019-08-28 LAB — IRON AND TIBC
Iron: 17 ug/dL — ABNORMAL LOW (ref 41–142)
Saturation Ratios: 5 % — ABNORMAL LOW (ref 21–57)
TIBC: 320 ug/dL (ref 236–444)
UIBC: 303 ug/dL (ref 120–384)

## 2019-08-30 ENCOUNTER — Inpatient Hospital Stay: Payer: Medicaid Other | Admitting: Hematology and Oncology

## 2019-08-30 ENCOUNTER — Inpatient Hospital Stay (HOSPITAL_BASED_OUTPATIENT_CLINIC_OR_DEPARTMENT_OTHER): Payer: Medicaid Other | Admitting: Hematology and Oncology

## 2019-08-30 DIAGNOSIS — D509 Iron deficiency anemia, unspecified: Secondary | ICD-10-CM

## 2019-08-30 NOTE — Progress Notes (Signed)
Patient Care Team: [facility] [place] as PCP - Samara Snide, MD (Inactive) as Referring Physician (Family Medicine)  DIAGNOSIS:  Encounter Diagnosis  Name Primary?  . Iron deficiency anemia, unspecified iron deficiency anemia type     CHIEF COMPLIANT: Follow-up of iron deficiency anemia  INTERVAL HISTORY: [name] is a 43 year old above-mentioned history of iron deficiency anemia who received IV iron therapy in January 2020.  She is here for routine follow-up.  She has had a prior history of iron deficiency anemia for most of her life.  The cause of iron deficiency is heavy bleeding from uterine fibroids. She is complaining of worsening fatigue once again. She continues to have problems with fibroid bleeding.  She sees a gynecologist soon. She wants to get the weight loss surgery done before the fibroid surgery because her gynecologist had told her that if she does not lose weight there will be surgical complications with fibroid surgery.  REVIEW OF SYSTEMS:   Constitutional: Denies fevers, chills or abnormal weight loss Eyes: Denies blurriness of vision Ears, nose, mouth, throat, and face: Denies mucositis or sore throat Respiratory: Denies cough, dyspnea or wheezes Cardiovascular: Denies palpitation, chest discomfort Gastrointestinal:  Denies nausea, heartburn or change in bowel habits Skin: Denies abnormal skin rashes Lymphatics: Denies new lymphadenopathy or easy bruising Neurological:Denies numbness, tingling or new weaknesses Behavioral/Psych: Mood is stable, no new changes  Extremities: No lower extremity edema   All other systems were reviewed with the patient and are negative.  I have reviewed the past medical history, past surgical history, social history and family history with the patient and they are unchanged from previous note.  ALLERGIES:  is allergic to doxycycline; fish allergy; amoxicillin; other; strawberry extract; celexa [citalopram  hydrobromide]; and gabapentin.  MEDICATIONS:  Current Outpatient Medications  Medication Sig Dispense Refill  . albuterol (PROAIR HFA) 108 (90 Base) MCG/ACT inhaler Use 2 puffs every 4 hours as needed for cough or wheeze.  May use 2 puffs 10-20 minutes prior to exercise. 1 Inhaler 0  . cetirizine (ZYRTEC) 10 MG tablet Take 10 mg by mouth daily.    . cyclobenzaprine (FLEXERIL) 10 MG tablet Take 10 mg by mouth 3 (three) times daily.    Marland Kitchen glipiZIDE (GLUCOTROL XL) 10 MG 24 hr tablet Take 10 mg by mouth daily with breakfast.    . HYDROcodone-acetaminophen (NORCO) 10-325 MG tablet TK 1 T PO BID PRN  0  . ibuprofen (ADVIL,MOTRIN) 800 MG tablet Take 800 mg by mouth 2 (two) times daily.    Marland Kitchen losartan (COZAAR) 100 MG tablet Take 1 tablet (100 mg total) by mouth daily. 30 tablet 3  . metFORMIN (GLUCOPHAGE) 1000 MG tablet Take 1,000 mg by mouth 2 (two) times daily with a meal.    . metroNIDAZOLE (FLAGYL) 500 MG tablet Take 1 tablet (500 mg total) by mouth 2 (two) times daily. 14 tablet 0  . mometasone-formoterol (DULERA) 200-5 MCG/ACT AERO Inhale 2 puffs into the lungs 2 (two) times daily. 1 Inhaler 0  . montelukast (SINGULAIR) 10 MG tablet Take 10 mg by mouth at bedtime.     No current facility-administered medications for this visit.     PHYSICAL EXAMINATION: ECOG PERFORMANCE STATUS: 1 - Symptomatic but completely ambulatory  There were no vitals filed for this visit. There were no vitals filed for this visit.  GENERAL:alert, no distress and comfortable SKIN: skin color, texture, turgor are normal, no rashes or significant lesions EYES: normal, Conjunctiva are pink and non-injected, sclera  clear OROPHARYNX:no exudate, no erythema and lips, buccal mucosa, and tongue normal  NECK: supple, thyroid normal size, non-tender, without nodularity LYMPH:  no palpable lymphadenopathy in the cervical, axillary or inguinal LUNGS: clear to auscultation and percussion with normal breathing effort HEART:  regular rate & rhythm and no murmurs and no lower extremity edema ABDOMEN:abdomen soft, non-tender and normal bowel sounds MUSCULOSKELETAL:no cyanosis of digits and no clubbing  NEURO: alert & oriented x 3 with fluent speech, no focal motor/sensory deficits EXTREMITIES: No lower extremity edema   LABORATORY DATA:  I have reviewed the data as listed CMP Latest Ref Rng & Units 08/27/2017 04/22/2016 11/24/2012  Glucose 65 - 99 mg/dL 162(H) 115(H) 104(H)  BUN 6 - 20 mg/dL 10 11 14   Creatinine 0.44 - 1.00 mg/dL 0.90 0.80 0.97  Sodium 135 - 145 mmol/L 135 136 137  Potassium 3.5 - 5.1 mmol/L 3.9 5.2(H) 4.3  Chloride 101 - 111 mmol/L 101 103 101  CO2 22 - 32 mmol/L 23 28 27   Calcium 8.9 - 10.3 mg/dL 9.1 8.6(L) 9.3  Total Protein 6.0 - 8.3 g/dL - - 8.0  Total Bilirubin 0.3 - 1.2 mg/dL - - 0.2(L)  Alkaline Phos 39 - 117 U/L - - 97  AST 0 - 37 U/L - - 10  ALT 0 - 35 U/L - - 8    Lab Results  Component Value Date   WBC 9.6 08/27/2019   HGB 11.0 (L) 08/27/2019   HCT 36.3 08/27/2019   MCV 66.0 (L) 08/27/2019   PLT 592 (H) 08/27/2019   NEUTROABS 6.9 08/27/2019    ASSESSMENT & PLAN:  Iron deficiency anemia Most likely cause: Uterine bleeding from fibroids plus malabsorption IV iron treatment: 09/28/2012, January 2020 Longstanding history of iron deficiency(all her life)  Blood work review  09/03/2018: Hemoglobin 9.8, MCV 61, platelet 663 02/26/2019: Iron saturation 12%, ferritin 35 improved from 9, hemoglobin 12 improved from 10.3 08/27/2019: Hemoglobin 11, MCV 66, platelets 592, iron saturation 5%, ferritin 28  IV iron treatment: 11/29/2018 Based on low iron saturation, I believe she will benefit from IV iron infusion.  Patient plans to undergo gastric bypass surgery in the future. And she might need IV iron therapy periodically. She is also checking in to see if she can have the fibroids removed.  Return to clinic in 3 months with labs done ahead of time and follow-up.  No orders  of the defined types were placed in this encounter.  The patient has a good understanding of the overall plan. she agrees with it. she will call with any problems that may develop before the next visit here.   Harriette Ohara, MD 08/30/19

## 2019-08-30 NOTE — Assessment & Plan Note (Signed)
Most likely cause: Uterine bleeding from fibroids plus malabsorption IV iron treatment: 09/28/2012, January 2020 Longstanding history of iron deficiency(all her life)  Blood work review  09/03/2018: Hemoglobin 9.8, MCV 61, platelet 663 02/26/2019: Iron saturation 12%, ferritin 35 improved from 9, hemoglobin 12 improved from 10.3 08/27/2019: Hemoglobin 11, MCV 66, platelets 592, iron saturation 5%, ferritin 28  IV iron treatment: 11/29/2018 Based on low iron saturation, I believe she will benefit from IV iron infusion.  Patient plans to undergo gastric bypass surgery in the future. And she might need IV iron therapy periodically. She is also checking in to see if she can have the fibroids removed.  Return to clinic in 3 months with labs done ahead of time and follow-up.

## 2019-08-30 NOTE — Assessment & Plan Note (Deleted)
Most likely cause: Uterine bleeding from fibroids plus malabsorption IV iron treatment: 09/28/2012, January 2020 Longstanding history of iron deficiency(all her life)  Blood work review  09/03/2018: Hemoglobin 9.8, MCV 61, platelet 663 02/26/2019: Iron saturation 12%, ferritin 35 improved from 9, hemoglobin 12 improved from 10.3 08/27/2019: Hemoglobin 11, MCV 66, platelets 592, iron saturation 5%, ferritin 28  IV iron treatment: 11/29/2018 Based on low iron saturation, I believe she will benefit from IV iron infusion.  Patient plans to undergo gastric bypass surgery in the future. And she might need IV iron therapy periodically. She is also checking in to see if she can have the fibroids removed.  Return to clinic in 3 months with labs done ahead of time and follow-up.

## 2019-09-06 ENCOUNTER — Other Ambulatory Visit: Payer: Self-pay | Admitting: Medical Oncology

## 2019-09-06 ENCOUNTER — Other Ambulatory Visit: Payer: Self-pay

## 2019-09-06 ENCOUNTER — Inpatient Hospital Stay: Payer: Medicaid Other | Attending: Hematology and Oncology

## 2019-09-06 VITALS — BP 139/79 | HR 92 | Temp 98.5°F | Resp 18

## 2019-09-06 DIAGNOSIS — D509 Iron deficiency anemia, unspecified: Secondary | ICD-10-CM

## 2019-09-06 DIAGNOSIS — D500 Iron deficiency anemia secondary to blood loss (chronic): Secondary | ICD-10-CM

## 2019-09-06 DIAGNOSIS — R110 Nausea: Secondary | ICD-10-CM

## 2019-09-06 DIAGNOSIS — Z79899 Other long term (current) drug therapy: Secondary | ICD-10-CM | POA: Insufficient documentation

## 2019-09-06 MED ORDER — ACETAMINOPHEN 325 MG PO TABS
650.0000 mg | ORAL_TABLET | Freq: Once | ORAL | Status: AC
  Administered 2019-09-06: 650 mg via ORAL

## 2019-09-06 MED ORDER — SODIUM CHLORIDE 0.9 % IV SOLN
200.0000 mg | Freq: Once | INTRAVENOUS | Status: AC
  Administered 2019-09-06: 200 mg via INTRAVENOUS
  Filled 2019-09-06: qty 10

## 2019-09-06 MED ORDER — ONDANSETRON HCL 4 MG/2ML IJ SOLN
INTRAMUSCULAR | Status: AC
  Filled 2019-09-06: qty 4

## 2019-09-06 MED ORDER — SODIUM CHLORIDE 0.9 % IV SOLN
Freq: Once | INTRAVENOUS | Status: AC
  Administered 2019-09-06: 14:00:00 via INTRAVENOUS
  Filled 2019-09-06: qty 250

## 2019-09-06 MED ORDER — ACETAMINOPHEN 325 MG PO TABS
ORAL_TABLET | ORAL | Status: AC
  Filled 2019-09-06: qty 2

## 2019-09-06 MED ORDER — ONDANSETRON HCL 4 MG/2ML IJ SOLN
8.0000 mg | Freq: Once | INTRAMUSCULAR | Status: AC
  Administered 2019-09-06: 8 mg via INTRAVENOUS

## 2019-09-06 NOTE — Progress Notes (Signed)
Immediately post iron infusion pt started c/o nausea.  No other signs/symptoms of reaction.  Per Dr. Lindi Adie 8mg  zofran given.  Pt quickly stated nausea subsided.  VSS.  No other complaints.

## 2019-09-13 ENCOUNTER — Other Ambulatory Visit: Payer: Self-pay

## 2019-09-13 ENCOUNTER — Inpatient Hospital Stay: Payer: Medicaid Other

## 2019-09-13 VITALS — BP 136/96 | HR 95 | Temp 98.3°F | Resp 18

## 2019-09-13 DIAGNOSIS — D509 Iron deficiency anemia, unspecified: Secondary | ICD-10-CM

## 2019-09-13 DIAGNOSIS — D500 Iron deficiency anemia secondary to blood loss (chronic): Secondary | ICD-10-CM

## 2019-09-13 MED ORDER — SODIUM CHLORIDE 0.9 % IV SOLN
Freq: Once | INTRAVENOUS | Status: AC
  Administered 2019-09-13: 15:00:00 via INTRAVENOUS
  Filled 2019-09-13: qty 250

## 2019-09-13 MED ORDER — ACETAMINOPHEN 325 MG PO TABS
ORAL_TABLET | ORAL | Status: AC
  Filled 2019-09-13: qty 2

## 2019-09-13 MED ORDER — ONDANSETRON HCL 4 MG/2ML IJ SOLN
8.0000 mg | Freq: Once | INTRAMUSCULAR | Status: AC
  Administered 2019-09-13: 15:00:00 8 mg via INTRAVENOUS

## 2019-09-13 MED ORDER — ACETAMINOPHEN 325 MG PO TABS
650.0000 mg | ORAL_TABLET | Freq: Once | ORAL | Status: AC
  Administered 2019-09-13: 650 mg via ORAL

## 2019-09-13 MED ORDER — SODIUM CHLORIDE 0.9 % IV SOLN
200.0000 mg | Freq: Once | INTRAVENOUS | Status: AC
  Administered 2019-09-13: 200 mg via INTRAVENOUS
  Filled 2019-09-13: qty 10

## 2019-09-13 MED ORDER — ONDANSETRON HCL 4 MG/2ML IJ SOLN
INTRAMUSCULAR | Status: AC
  Filled 2019-09-13: qty 4

## 2019-09-13 NOTE — Patient Instructions (Signed)
Iron Sucrose injection What is this medicine? IRON SUCROSE (AHY ern SOO krohs) is an iron complex. Iron is used to make healthy red blood cells, which carry oxygen and nutrients throughout the body. This medicine is used to treat iron deficiency anemia in people with chronic kidney disease. This medicine may be used for other purposes; ask your health care provider or pharmacist if you have questions. COMMON BRAND NAME(S): Venofer What should I tell my health care provider before I take this medicine? They need to know if you have any of these conditions:  anemia not caused by low iron levels  heart disease  high levels of iron in the blood  kidney disease  liver disease  an unusual or allergic reaction to iron, other medicines, foods, dyes, or preservatives  pregnant or trying to get pregnant  breast-feeding How should I use this medicine? This medicine is for infusion into a vein. It is given by a health care professional in a hospital or clinic setting. Talk to your pediatrician regarding the use of this medicine in children. While this drug may be prescribed for children as young as 2 years for selected conditions, precautions do apply. Overdosage: If you think you have taken too much of this medicine contact a poison control center or emergency room at once. NOTE: This medicine is only for you. Do not share this medicine with others. What if I miss a dose? It is important not to miss your dose. Call your doctor or health care professional if you are unable to keep an appointment. What may interact with this medicine? Do not take this medicine with any of the following medications:  deferoxamine  dimercaprol  other iron products This medicine may also interact with the following medications:  chloramphenicol  deferasirox This list may not describe all possible interactions. Give your health care provider a list of all the medicines, herbs, non-prescription drugs, or  dietary supplements you use. Also tell them if you smoke, drink alcohol, or use illegal drugs. Some items may interact with your medicine. What should I watch for while using this medicine? Visit your doctor or healthcare professional regularly. Tell your doctor or healthcare professional if your symptoms do not start to get better or if they get worse. You may need blood work done while you are taking this medicine. You may need to follow a special diet. Talk to your doctor. Foods that contain iron include: whole grains/cereals, dried fruits, beans, or peas, leafy green vegetables, and organ meats (liver, kidney). What side effects may I notice from receiving this medicine? Side effects that you should report to your doctor or health care professional as soon as possible:  allergic reactions like skin rash, itching or hives, swelling of the face, lips, or tongue  breathing problems  changes in blood pressure  cough  fast, irregular heartbeat  feeling faint or lightheaded, falls  fever or chills  flushing, sweating, or hot feelings  joint or muscle aches/pains  seizures  swelling of the ankles or feet  unusually weak or tired Side effects that usually do not require medical attention (report to your doctor or health care professional if they continue or are bothersome):  diarrhea  feeling achy  headache  irritation at site where injected  nausea, vomiting  stomach upset  tiredness This list may not describe all possible side effects. Call your doctor for medical advice about side effects. You may report side effects to FDA at 1-800-FDA-1088. Where should I keep   my medicine? This drug is given in a hospital or clinic and will not be stored at home. NOTE: This sheet is a summary. It may not cover all possible information. If you have questions about this medicine, talk to your doctor, pharmacist, or health care provider.  2020 Elsevier/Gold Standard (2011-07-29  17:14:35) Coronavirus (COVID-19) Are you at risk?  Are you at risk for the Coronavirus (COVID-19)?  To be considered HIGH RISK for Coronavirus (COVID-19), you have to meet the following criteria:  . Traveled to [place], [place], [place], [place] or [place]; or in the [place] to [place], [place], [place], or [place]; and have fever, cough, and shortness of breath within the last 2 weeks of travel OR . Been in close contact with a person diagnosed with COVID-19 within the last 2 weeks and have fever, cough, and shortness of breath . IF YOU DO NOT MEET THESE CRITERIA, YOU ARE CONSIDERED LOW RISK FOR COVID-19.  What to do if you are HIGH RISK for COVID-19?  . If you are having a medical emergency, call 911. . Seek medical care right away. Before you go to a doctor's office, urgent care or emergency department, call ahead and tell them about your recent travel, contact with someone diagnosed with COVID-19, and your symptoms. You should receive instructions from your physician's office regarding next steps of care.  . When you arrive at healthcare provider, tell the healthcare staff immediately you have returned from visiting [place], [place], [place], [place] or [place]; or traveled in the [place] to [place], [place], [place], or [place]; in the last two weeks or you have been in close contact with a person diagnosed with COVID-19 in the last 2 weeks.   . Tell the health care staff about your symptoms: fever, cough and shortness of breath. . After you have been seen by a medical provider, you will be either: o Tested for (COVID-19) and discharged home on quarantine except to seek medical care if symptoms worsen, and asked to  - Stay home and avoid contact with others until you get your results (4-5 days)  - Avoid travel on public transportation if possible (such as bus, train, or airplane) or o Sent to the Emergency Department by EMS for evaluation, COVID-19 testing, and  possible admission depending on your condition and test results.  What to do if you are LOW RISK for COVID-19?  Reduce your risk of any infection by using the same precautions used for avoiding the common cold or flu:  . Wash your hands often with soap and warm water for at least 20 seconds.  If soap and water are not readily available, use an alcohol-based hand sanitizer with at least 60% alcohol.  . If coughing or sneezing, cover your mouth and nose by coughing or sneezing into the elbow areas of your shirt or coat, into a tissue or into your sleeve (not your hands). . Avoid shaking hands with others and consider head nods or verbal greetings only. . Avoid touching your eyes, nose, or mouth with unwashed hands.  . Avoid close contact with people who are sick. . Avoid places or events with large numbers of people in one location, like concerts or sporting events. . Carefully consider travel plans you have or are making. . If you are planning any travel outside or inside the [place], visit the CDC's Travelers' Health webpage for the latest health notices. . If you have some symptoms but not all   symptoms, continue to monitor at home and seek medical attention if your symptoms worsen. . If you are having a medical emergency, call 911.   ADDITIONAL HEALTHCARE OPTIONS FOR PATIENTS  [facility] / e-Visit: [email or web address]         [facility]: [phone]  [facility]: [phone]                   [facility]: [phone]  

## 2019-09-20 ENCOUNTER — Inpatient Hospital Stay: Payer: Medicaid Other

## 2019-09-20 ENCOUNTER — Telehealth: Payer: Self-pay | Admitting: Hematology and Oncology

## 2019-09-20 NOTE — Telephone Encounter (Signed)
Returned patient's phone call regarding cancelling 11/19 appointment, patient is having a conflict with transportation and will not have transportation for the rest of this week. Patient will receive a call back once I speak with providers nurse to reschedule.    Message to provider.

## 2019-09-26 ENCOUNTER — Telehealth: Payer: Self-pay | Admitting: Hematology and Oncology

## 2019-09-26 ENCOUNTER — Ambulatory Visit: Payer: Medicaid Other

## 2019-09-26 NOTE — Telephone Encounter (Signed)
Returned patient's phone call regarding rescheduling 11/19 missed appointment, 11/19 appointment has been rescheduled to 12/25 per providers nurse approval.

## 2019-09-28 ENCOUNTER — Other Ambulatory Visit: Payer: Self-pay

## 2019-09-28 ENCOUNTER — Inpatient Hospital Stay: Payer: Medicaid Other

## 2019-09-28 VITALS — BP 131/88 | HR 88 | Temp 98.7°F | Resp 17

## 2019-09-28 DIAGNOSIS — D500 Iron deficiency anemia secondary to blood loss (chronic): Secondary | ICD-10-CM

## 2019-09-28 DIAGNOSIS — D509 Iron deficiency anemia, unspecified: Secondary | ICD-10-CM

## 2019-09-28 MED ORDER — SODIUM CHLORIDE 0.9 % IV SOLN
200.0000 mg | Freq: Once | INTRAVENOUS | Status: AC
  Administered 2019-09-28: 15:00:00 200 mg via INTRAVENOUS
  Filled 2019-09-28: qty 10

## 2019-09-28 MED ORDER — ONDANSETRON HCL 4 MG/2ML IJ SOLN
INTRAMUSCULAR | Status: AC
  Filled 2019-09-28: qty 4

## 2019-09-28 MED ORDER — ONDANSETRON HCL 4 MG/2ML IJ SOLN
8.0000 mg | Freq: Once | INTRAMUSCULAR | Status: AC
  Administered 2019-09-28: 8 mg via INTRAVENOUS

## 2019-09-28 MED ORDER — ACETAMINOPHEN 325 MG PO TABS
ORAL_TABLET | ORAL | Status: AC
  Filled 2019-09-28: qty 2

## 2019-09-28 MED ORDER — SODIUM CHLORIDE 0.9 % IV SOLN
Freq: Once | INTRAVENOUS | Status: AC
  Administered 2019-09-28: 14:00:00 via INTRAVENOUS
  Filled 2019-09-28: qty 250

## 2019-09-28 MED ORDER — ACETAMINOPHEN 325 MG PO TABS
650.0000 mg | ORAL_TABLET | Freq: Once | ORAL | Status: AC
  Administered 2019-09-28: 650 mg via ORAL

## 2019-09-28 NOTE — Patient Instructions (Signed)
Iron Sucrose injection What is this medicine? IRON SUCROSE (AHY ern SOO krohs) is an iron complex. Iron is used to make healthy red blood cells, which carry oxygen and nutrients throughout the body. This medicine is used to treat iron deficiency anemia in people with chronic kidney disease. This medicine may be used for other purposes; ask your health care provider or pharmacist if you have questions. COMMON BRAND NAME(S): Venofer What should I tell my health care provider before I take this medicine? They need to know if you have any of these conditions:  anemia not caused by low iron levels  heart disease  high levels of iron in the blood  kidney disease  liver disease  an unusual or allergic reaction to iron, other medicines, foods, dyes, or preservatives  pregnant or trying to get pregnant  breast-feeding How should I use this medicine? This medicine is for infusion into a vein. It is given by a health care professional in a hospital or clinic setting. Talk to your pediatrician regarding the use of this medicine in children. While this drug may be prescribed for children as young as 2 years for selected conditions, precautions do apply. Overdosage: If you think you have taken too much of this medicine contact a poison control center or emergency room at once. NOTE: This medicine is only for you. Do not share this medicine with others. What if I miss a dose? It is important not to miss your dose. Call your doctor or health care professional if you are unable to keep an appointment. What may interact with this medicine? Do not take this medicine with any of the following medications:  deferoxamine  dimercaprol  other iron products This medicine may also interact with the following medications:  chloramphenicol  deferasirox This list may not describe all possible interactions. Give your health care provider a list of all the medicines, herbs, non-prescription drugs, or  dietary supplements you use. Also tell them if you smoke, drink alcohol, or use illegal drugs. Some items may interact with your medicine. What should I watch for while using this medicine? Visit your doctor or healthcare professional regularly. Tell your doctor or healthcare professional if your symptoms do not start to get better or if they get worse. You may need blood work done while you are taking this medicine. You may need to follow a special diet. Talk to your doctor. Foods that contain iron include: whole grains/cereals, dried fruits, beans, or peas, leafy green vegetables, and organ meats (liver, kidney). What side effects may I notice from receiving this medicine? Side effects that you should report to your doctor or health care professional as soon as possible:  allergic reactions like skin rash, itching or hives, swelling of the face, lips, or tongue  breathing problems  changes in blood pressure  cough  fast, irregular heartbeat  feeling faint or lightheaded, falls  fever or chills  flushing, sweating, or hot feelings  joint or muscle aches/pains  seizures  swelling of the ankles or feet  unusually weak or tired Side effects that usually do not require medical attention (report to your doctor or health care professional if they continue or are bothersome):  diarrhea  feeling achy  headache  irritation at site where injected  nausea, vomiting  stomach upset  tiredness This list may not describe all possible side effects. Call your doctor for medical advice about side effects. You may report side effects to FDA at 1-800-FDA-1088. Where should I keep   my medicine? This drug is given in a hospital or clinic and will not be stored at home. NOTE: This sheet is a summary. It may not cover all possible information. If you have questions about this medicine, talk to your doctor, pharmacist, or health care provider.  2020 Elsevier/Gold Standard (2011-07-29  17:14:35) Coronavirus (COVID-19) Are you at risk?  Are you at risk for the Coronavirus (COVID-19)?  To be considered HIGH RISK for Coronavirus (COVID-19), you have to meet the following criteria:  . Traveled to [place], [place], [place], [place] or [place]; or in the [place] to [place], [place], [place], or [place]; and have fever, cough, and shortness of breath within the last 2 weeks of travel OR . Been in close contact with a person diagnosed with COVID-19 within the last 2 weeks and have fever, cough, and shortness of breath . IF YOU DO NOT MEET THESE CRITERIA, YOU ARE CONSIDERED LOW RISK FOR COVID-19.  What to do if you are HIGH RISK for COVID-19?  . If you are having a medical emergency, call 911. . Seek medical care right away. Before you go to a doctor's office, urgent care or emergency department, call ahead and tell them about your recent travel, contact with someone diagnosed with COVID-19, and your symptoms. You should receive instructions from your physician's office regarding next steps of care.  . When you arrive at healthcare provider, tell the healthcare staff immediately you have returned from visiting [place], [place], [place], [place] or [place]; or traveled in the [place] to [place], [place], [place], or [place]; in the last two weeks or you have been in close contact with a person diagnosed with COVID-19 in the last 2 weeks.   . Tell the health care staff about your symptoms: fever, cough and shortness of breath. . After you have been seen by a medical provider, you will be either: o Tested for (COVID-19) and discharged home on quarantine except to seek medical care if symptoms worsen, and asked to  - Stay home and avoid contact with others until you get your results (4-5 days)  - Avoid travel on public transportation if possible (such as bus, train, or airplane) or o Sent to the Emergency Department by EMS for evaluation, COVID-19 testing, and  possible admission depending on your condition and test results.  What to do if you are LOW RISK for COVID-19?  Reduce your risk of any infection by using the same precautions used for avoiding the common cold or flu:  . Wash your hands often with soap and warm water for at least 20 seconds.  If soap and water are not readily available, use an alcohol-based hand sanitizer with at least 60% alcohol.  . If coughing or sneezing, cover your mouth and nose by coughing or sneezing into the elbow areas of your shirt or coat, into a tissue or into your sleeve (not your hands). . Avoid shaking hands with others and consider head nods or verbal greetings only. . Avoid touching your eyes, nose, or mouth with unwashed hands.  . Avoid close contact with people who are sick. . Avoid places or events with large numbers of people in one location, like concerts or sporting events. . Carefully consider travel plans you have or are making. . If you are planning any travel outside or inside the [place], visit the CDC's Travelers' Health webpage for the latest health notices. . If you have some symptoms but not all   symptoms, continue to monitor at home and seek medical attention if your symptoms worsen. . If you are having a medical emergency, call 911.   ADDITIONAL HEALTHCARE OPTIONS FOR PATIENTS  [facility] / e-Visit: [email or web address]         [facility]: [phone]  [facility]: [phone]                   [facility]: [phone]  

## 2019-10-25 ENCOUNTER — Telehealth: Payer: Self-pay | Admitting: Hematology and Oncology

## 2019-10-25 ENCOUNTER — Inpatient Hospital Stay: Payer: Medicaid Other

## 2019-10-25 NOTE — Telephone Encounter (Signed)
Returned patient's phone call regarding cancelling 12/24 infusion appointment, per 12/24 scheduled message appointment has been cancelled. Left a voicemail for patient to reschedule.

## 2019-12-21 ENCOUNTER — Other Ambulatory Visit: Payer: Self-pay | Admitting: *Deleted

## 2019-12-21 DIAGNOSIS — D509 Iron deficiency anemia, unspecified: Secondary | ICD-10-CM

## 2019-12-24 ENCOUNTER — Inpatient Hospital Stay: Payer: Medicaid Other | Attending: Hematology and Oncology

## 2019-12-27 ENCOUNTER — Inpatient Hospital Stay: Payer: Medicaid Other | Admitting: Hematology and Oncology

## 2019-12-27 NOTE — Assessment & Plan Note (Deleted)
Most likely cause: Uterine bleeding from fibroids plus malabsorption IV iron treatment: 09/28/2012,January 2020, Nov 2020 Longstanding history of iron deficiency(all her life)  Blood work review  09/03/2018: Hemoglobin 9.8, MCV 61, platelet 663 02/26/2019:Iron saturation 12%, ferritin 35improved from 9, hemoglobin 12 improved from 10.3 08/27/2019: Hemoglobin 11, MCV 66, platelets 592, iron saturation 5%, ferritin 28 12/27/19:   Patient plans to undergo gastric bypass surgery in the future. And she might need IV iron therapy periodically. She is also checking in to see if she can have the fibroids removed.  Return to clinic in 3 months with labs done ahead of time and follow-up.

## 2020-02-26 ENCOUNTER — Telehealth: Payer: Self-pay

## 2020-02-26 NOTE — Telephone Encounter (Signed)
RN returned call.  Pt has labs drawn at PCP and requesting for MD to review for infusion needs.   Pt will have labs faxed directly to our clinic.  RN informed once labs received we will have MD review for recommendations and set up appointments accordingly.  Pt verbalized understanding and agreement and will have [facility] fax labs.

## 2020-04-30 ENCOUNTER — Encounter (HOSPITAL_COMMUNITY): Payer: Self-pay | Admitting: Emergency Medicine

## 2020-04-30 ENCOUNTER — Emergency Department (HOSPITAL_COMMUNITY)
Admission: EM | Admit: 2020-04-30 | Discharge: 2020-04-30 | Disposition: A | Discharge status: Home / Self Care | Payer: Medicaid Other | Attending: Emergency Medicine | Admitting: Emergency Medicine

## 2020-04-30 ENCOUNTER — Emergency Department (HOSPITAL_COMMUNITY): Payer: Medicaid Other

## 2020-04-30 ENCOUNTER — Other Ambulatory Visit: Payer: Self-pay

## 2020-04-30 DIAGNOSIS — Z5321 Procedure and treatment not carried out due to patient leaving prior to being seen by health care provider: Secondary | ICD-10-CM | POA: Diagnosis not present

## 2020-04-30 DIAGNOSIS — M79605 Pain in left leg: Secondary | ICD-10-CM | POA: Diagnosis not present

## 2020-04-30 DIAGNOSIS — R110 Nausea: Secondary | ICD-10-CM | POA: Diagnosis not present

## 2020-04-30 DIAGNOSIS — R079 Chest pain, unspecified: Secondary | ICD-10-CM | POA: Diagnosis present

## 2020-04-30 DIAGNOSIS — M79604 Pain in right leg: Secondary | ICD-10-CM | POA: Diagnosis not present

## 2020-04-30 LAB — CBC
HCT: 32.7 % — ABNORMAL LOW (ref 36.0–46.0)
Hemoglobin: 9.4 g/dL — ABNORMAL LOW (ref 12.0–15.0)
MCH: 17.8 pg — ABNORMAL LOW (ref 26.0–34.0)
MCHC: 28.7 g/dL — ABNORMAL LOW (ref 30.0–36.0)
MCV: 61.9 fL — ABNORMAL LOW (ref 80.0–100.0)
Platelets: 624 10*3/uL — ABNORMAL HIGH (ref 150–400)
RBC: 5.28 MIL/uL — ABNORMAL HIGH (ref 3.87–5.11)
RDW: 21.1 % — ABNORMAL HIGH (ref 11.5–15.5)
WBC: 15.1 10*3/uL — ABNORMAL HIGH (ref 4.0–10.5)
nRBC: 0.1 % (ref 0.0–0.2)

## 2020-04-30 LAB — BASIC METABOLIC PANEL
Anion gap: 13 (ref 5–15)
BUN: 12 mg/dL (ref 6–20)
CO2: 24 mmol/L (ref 22–32)
Calcium: 8.6 mg/dL — ABNORMAL LOW (ref 8.9–10.3)
Chloride: 100 mmol/L (ref 98–111)
Creatinine, Ser: 0.89 mg/dL (ref 0.44–1.00)
GFR calc Af Amer: >60 mL/min (ref >60)
GFR calc non Af Amer: >60 mL/min (ref >60)
Glucose, Bld: 373 mg/dL — ABNORMAL HIGH (ref 70–99)
Potassium: 4.6 mmol/L (ref 3.5–5.1)
Sodium: 137 mmol/L (ref 135–145)

## 2020-04-30 LAB — TROPONIN I (HIGH SENSITIVITY): Troponin I (High Sensitivity): 3 ng/L (ref <18)

## 2020-04-30 LAB — I-STAT BETA HCG BLOOD, ED (NOT ORDERABLE): I-stat hCG, quantitative: <5 m[IU]/mL (ref <5)

## 2020-04-30 NOTE — ED Triage Notes (Signed)
Patient complains of bilateral lower leg pain and L chest pain since last night. Has been taking muscle relaxers and oxycodone w/o relief. Endorses nausea w/ no vomiting or abdominal pain.

## 2020-05-13 ENCOUNTER — Other Ambulatory Visit: Payer: Self-pay | Admitting: Physician Assistant

## 2020-05-13 DIAGNOSIS — M545 Low back pain, unspecified: Secondary | ICD-10-CM

## 2020-05-27 ENCOUNTER — Other Ambulatory Visit: Payer: Medicaid Other

## 2020-06-09 ENCOUNTER — Telehealth: Payer: Self-pay | Admitting: Hematology and Oncology

## 2020-06-09 NOTE — Telephone Encounter (Signed)
Scheduled per 8/9 sch message. Pt is aware of appt time and date. 

## 2020-06-10 ENCOUNTER — Emergency Department (HOSPITAL_COMMUNITY)
Admission: EM | Admit: 2020-06-10 | Discharge: 2020-06-10 | Discharge status: Against Medical Advice | Payer: Medicaid Other

## 2020-06-10 LAB — CBG MONITORING, ED: Glucose-Capillary: 405 mg/dL — ABNORMAL HIGH (ref 70–99)

## 2020-06-13 ENCOUNTER — Other Ambulatory Visit: Payer: Self-pay

## 2020-06-13 ENCOUNTER — Other Ambulatory Visit: Payer: Self-pay | Admitting: Hematology and Oncology

## 2020-06-13 ENCOUNTER — Inpatient Hospital Stay: Payer: Medicaid Other

## 2020-06-13 ENCOUNTER — Inpatient Hospital Stay: Payer: Medicaid Other | Attending: Hematology and Oncology | Admitting: Hematology and Oncology

## 2020-06-13 DIAGNOSIS — Z79899 Other long term (current) drug therapy: Secondary | ICD-10-CM | POA: Diagnosis not present

## 2020-06-13 DIAGNOSIS — E669 Obesity, unspecified: Secondary | ICD-10-CM | POA: Insufficient documentation

## 2020-06-13 DIAGNOSIS — D500 Iron deficiency anemia secondary to blood loss (chronic): Secondary | ICD-10-CM | POA: Insufficient documentation

## 2020-06-13 DIAGNOSIS — R5383 Other fatigue: Secondary | ICD-10-CM | POA: Insufficient documentation

## 2020-06-13 DIAGNOSIS — Z9884 Bariatric surgery status: Secondary | ICD-10-CM | POA: Insufficient documentation

## 2020-06-13 DIAGNOSIS — D509 Iron deficiency anemia, unspecified: Secondary | ICD-10-CM

## 2020-06-13 LAB — CBC WITH DIFFERENTIAL (CANCER CENTER ONLY)
Abs Immature Granulocytes: 0.07 10*3/uL (ref 0.00–0.07)
Basophils Absolute: 0 10*3/uL (ref 0.0–0.1)
Basophils Relative: 0 %
Eosinophils Absolute: 0.2 10*3/uL (ref 0.0–0.5)
Eosinophils Relative: 1 %
HCT: 33.9 % — ABNORMAL LOW (ref 36.0–46.0)
Hemoglobin: 9.3 g/dL — ABNORMAL LOW (ref 12.0–15.0)
Immature Granulocytes: 1 %
Lymphocytes Relative: 30 %
Lymphs Abs: 4.5 10*3/uL — ABNORMAL HIGH (ref 0.7–4.0)
MCH: 16.2 pg — ABNORMAL LOW (ref 26.0–34.0)
MCHC: 27.4 g/dL — ABNORMAL LOW (ref 30.0–36.0)
MCV: 59.2 fL — ABNORMAL LOW (ref 80.0–100.0)
Monocytes Absolute: 0.8 10*3/uL (ref 0.1–1.0)
Monocytes Relative: 5 %
Neutro Abs: 9.3 10*3/uL — ABNORMAL HIGH (ref 1.7–7.7)
Neutrophils Relative %: 63 %
Platelet Count: 755 10*3/uL — ABNORMAL HIGH (ref 150–400)
RBC: 5.73 MIL/uL — ABNORMAL HIGH (ref 3.87–5.11)
RDW: 22.1 % — ABNORMAL HIGH (ref 11.5–15.5)
WBC Count: 14.9 10*3/uL — ABNORMAL HIGH (ref 4.0–10.5)
nRBC: 0.2 % (ref 0.0–0.2)

## 2020-06-13 LAB — IRON AND TIBC
Iron: 18 ug/dL — ABNORMAL LOW (ref 41–142)
Saturation Ratios: 5 % — ABNORMAL LOW (ref 21–57)
TIBC: 390 ug/dL (ref 236–444)
UIBC: 372 ug/dL (ref 120–384)

## 2020-06-13 LAB — FERRITIN: Ferritin: 19 ng/mL (ref 11–307)

## 2020-06-13 MED ORDER — GABAPENTIN 100 MG PO CAPS
100.0000 mg | ORAL_CAPSULE | Freq: Three times a day (TID) | ORAL | Status: DC
Start: 2020-06-13 — End: 2023-10-31

## 2020-06-13 NOTE — Progress Notes (Signed)
Patient Care Team: [facility] as PCP - Samara Snide, MD (Inactive) as Referring Physician (Family Medicine)  DIAGNOSIS:    ICD-10-CM   1. Iron deficiency anemia due to chronic blood loss  D50.0     CHIEF COMPLIANT: Follow-up of iron deficiency anemia  INTERVAL HISTORY: [name] is a 44 y.o. with above-mentioned history of iron deficiency anemia who previously received IV iron therapy. She presents to the clinic today for follow-up.  Her major complaint is diffuse aches and pains which are chronic in nature.  She also feeling extremely fatigued.  She is also craving ice chips.  She did not show up for her appointment previously.  She tells me that she has had transportation issues.  ALLERGIES:  is allergic to doxycycline, fish allergy, amoxicillin, other, strawberry extract, celexa [citalopram hydrobromide], and gabapentin.  MEDICATIONS:  Current Outpatient Medications  Medication Sig Dispense Refill  . albuterol (PROAIR HFA) 108 (90 Base) MCG/ACT inhaler Use 2 puffs every 4 hours as needed for cough or wheeze.  May use 2 puffs 10-20 minutes prior to exercise. 1 Inhaler 0  . cetirizine (ZYRTEC) 10 MG tablet Take 10 mg by mouth daily.    . cyclobenzaprine (FLEXERIL) 10 MG tablet Take 10 mg by mouth 3 (three) times daily.    Marland Kitchen glipiZIDE (GLUCOTROL XL) 10 MG 24 hr tablet Take 10 mg by mouth daily with breakfast.    . HYDROcodone-acetaminophen (NORCO) 10-325 MG tablet TK 1 T PO BID PRN  0  . ibuprofen (ADVIL,MOTRIN) 800 MG tablet Take 800 mg by mouth 2 (two) times daily.    Marland Kitchen losartan (COZAAR) 100 MG tablet Take 1 tablet (100 mg total) by mouth daily. 30 tablet 3  . metFORMIN (GLUCOPHAGE) 1000 MG tablet Take 1,000 mg by mouth 2 (two) times daily with a meal.    . metroNIDAZOLE (FLAGYL) 500 MG tablet Take 1 tablet (500 mg total) by mouth 2 (two) times daily. 14 tablet 0  . mometasone-formoterol (DULERA) 200-5 MCG/ACT AERO Inhale 2 puffs into the lungs 2 (two) times  daily. 1 Inhaler 0  . montelukast (SINGULAIR) 10 MG tablet Take 10 mg by mouth at bedtime.     No current facility-administered medications for this visit.    PHYSICAL EXAMINATION: ECOG PERFORMANCE STATUS: 1 - Symptomatic but completely ambulatory  Vitals:   06/13/20 1009  BP: (!) 148/98  Pulse: (!) 113  Resp: 19  Temp: 98.4 F (36.9 C)  SpO2: 98%   Filed Weights   06/13/20 1009  Weight: (!) 476 lb 9.6 oz (216.2 kg)    LABORATORY DATA:  I have reviewed the data as listed CMP Latest Ref Rng & Units 04/30/2020 08/27/2017 04/22/2016  Glucose 70 - 99 mg/dL 373(H) 162(H) 115(H)  BUN 6 - 20 mg/dL 12 10 11   Creatinine 0.44 - 1.00 mg/dL 0.89 0.90 0.80  Sodium 135 - 145 mmol/L 137 135 136  Potassium 3.5 - 5.1 mmol/L 4.6 3.9 5.2(H)  Chloride 98 - 111 mmol/L 100 101 103  CO2 22 - 32 mmol/L 24 23 28   Calcium 8.9 - 10.3 mg/dL 8.6(L) 9.1 8.6(L)  Total Protein 6.0 - 8.3 g/dL - - -  Total Bilirubin 0.3 - 1.2 mg/dL - - -  Alkaline Phos 39 - 117 U/L - - -  AST 0 - 37 U/L - - -  ALT 0 - 35 U/L - - -    Lab Results  Component Value Date   WBC 14.9 (H) 06/13/2020  HGB 9.3 (L) 06/13/2020   HCT 33.9 (L) 06/13/2020   MCV 59.2 (L) 06/13/2020   PLT 755 (H) 06/13/2020   NEUTROABS PENDING 06/13/2020    ASSESSMENT & PLAN:  ANEMIA, IRON DEFICIENCY Most likely cause: Uterine bleeding from fibroids plus malabsorption IV iron treatment: 09/28/2012,January 2020 Longstanding history of iron deficiency(all her life)  Blood work review  09/03/2018: Hemoglobin 9.8, MCV 61, platelet 663 02/26/2019:Iron saturation 12%, ferritin 35improved from 9, hemoglobin 12 improved from 10.3 08/27/2019: Hemoglobin 11, MCV 66, platelets 592, iron saturation 5%, ferritin 28 04/30/2020: Hemoglobin 9.4, MCV 61.9, platelets 624  IV iron treatment: 11/29/2018, November 2020  CBC and iron studies have been drawn today.    She will be set up for IV iron therapy. Severe obesity: Patient wants to consider  gastric bypass surgery but she has not pursued it yet.  I will see her back in 4 months with labs done ahead of time and follow-up.   No orders of the defined types were placed in this encounter.  The patient has a good understanding of the overall plan. she agrees with it. she will call with any problems that may develop before the next visit here.  Total time spent: 30 mins including face to face time and time spent for planning, charting and coordination of care  Nicholas Lose, MD 06/13/2020  I, Cloyde Reams Dorshimer, am acting as scribe for Dr. Nicholas Lose.  I have reviewed the above documentation for accuracy and completeness, and I agree with the above.

## 2020-06-13 NOTE — Assessment & Plan Note (Addendum)
Most likely cause: Uterine bleeding from fibroids plus malabsorption IV iron treatment: 09/28/2012,January 2020 Longstanding history of iron deficiency(all her life)  Blood work review  09/03/2018: Hemoglobin 9.8, MCV 61, platelet 663 02/26/2019:Iron saturation 12%, ferritin 35improved from 9, hemoglobin 12 improved from 10.3 08/27/2019: Hemoglobin 11, MCV 66, platelets 592, iron saturation 5%, ferritin 28 04/30/2020: Hemoglobin 9.4, MCV 61.9, platelets 624  IV iron treatment: 11/29/2018, November 2020  CBC and iron studies have been drawn today.  Patient will most likely need IV iron therapy.  I will call her with the results of these tests and determine when we can give her intravenous iron therapy.

## 2020-06-24 ENCOUNTER — Inpatient Hospital Stay: Payer: Medicaid Other

## 2020-06-24 ENCOUNTER — Other Ambulatory Visit: Payer: Self-pay

## 2020-06-24 VITALS — BP 134/81 | HR 95 | Temp 97.8°F | Resp 18

## 2020-06-24 DIAGNOSIS — D509 Iron deficiency anemia, unspecified: Secondary | ICD-10-CM

## 2020-06-24 DIAGNOSIS — D500 Iron deficiency anemia secondary to blood loss (chronic): Secondary | ICD-10-CM

## 2020-06-24 MED ORDER — SODIUM CHLORIDE 0.9 % IV SOLN
300.0000 mg | Freq: Once | INTRAVENOUS | Status: AC
  Administered 2020-06-24: 300 mg via INTRAVENOUS
  Filled 2020-06-24: qty 10

## 2020-06-24 MED ORDER — SODIUM CHLORIDE 0.9 % IV SOLN
Freq: Once | INTRAVENOUS | Status: AC
  Filled 2020-06-24: qty 250

## 2020-06-24 NOTE — Patient Instructions (Signed)

## 2020-06-26 ENCOUNTER — Other Ambulatory Visit: Payer: Self-pay | Admitting: Sports Medicine

## 2020-06-26 DIAGNOSIS — M545 Low back pain, unspecified: Secondary | ICD-10-CM

## 2020-06-27 ENCOUNTER — Other Ambulatory Visit: Payer: Self-pay

## 2020-06-27 ENCOUNTER — Inpatient Hospital Stay: Payer: Medicaid Other

## 2020-06-27 VITALS — BP 115/68 | HR 96 | Temp 98.4°F | Resp 18

## 2020-06-27 DIAGNOSIS — D500 Iron deficiency anemia secondary to blood loss (chronic): Secondary | ICD-10-CM | POA: Diagnosis not present

## 2020-06-27 DIAGNOSIS — D509 Iron deficiency anemia, unspecified: Secondary | ICD-10-CM

## 2020-06-27 MED ORDER — SODIUM CHLORIDE 0.9 % IV SOLN
300.0000 mg | Freq: Once | INTRAVENOUS | Status: AC
  Administered 2020-06-27: 300 mg via INTRAVENOUS
  Filled 2020-06-27: qty 10

## 2020-06-27 MED ORDER — SODIUM CHLORIDE 0.9 % IV SOLN
Freq: Once | INTRAVENOUS | Status: AC
  Filled 2020-06-27: qty 250

## 2020-06-27 NOTE — Progress Notes (Signed)
Patient refused 30 min post observation. Patient discharged in stable condition with no complaints.

## 2020-06-27 NOTE — Patient Instructions (Signed)

## 2020-06-29 ENCOUNTER — Other Ambulatory Visit: Payer: Self-pay

## 2020-06-29 ENCOUNTER — Emergency Department (HOSPITAL_COMMUNITY)
Admission: EM | Admit: 2020-06-29 | Discharge: 2020-06-30 | Disposition: A | Discharge status: Home / Self Care | Payer: Medicaid Other | Attending: Emergency Medicine | Admitting: Emergency Medicine

## 2020-06-29 ENCOUNTER — Encounter (HOSPITAL_COMMUNITY): Payer: Self-pay

## 2020-06-29 DIAGNOSIS — Z5321 Procedure and treatment not carried out due to patient leaving prior to being seen by health care provider: Secondary | ICD-10-CM | POA: Diagnosis not present

## 2020-06-29 DIAGNOSIS — R531 Weakness: Secondary | ICD-10-CM | POA: Diagnosis not present

## 2020-06-29 LAB — CBG MONITORING, ED: Glucose-Capillary: 165 mg/dL — ABNORMAL HIGH (ref 70–99)

## 2020-06-29 NOTE — ED Triage Notes (Signed)
Per ems: pt coming in c/o sudden onset of weakness 1 hr ago. No shob, chest pain, n/v/d. Negative orthostatics

## 2020-06-30 LAB — BASIC METABOLIC PANEL
Anion gap: 13 (ref 5–15)
BUN: 12 mg/dL (ref 6–20)
CO2: 22 mmol/L (ref 22–32)
Calcium: 8.5 mg/dL — ABNORMAL LOW (ref 8.9–10.3)
Chloride: 103 mmol/L (ref 98–111)
Creatinine, Ser: 0.81 mg/dL (ref 0.44–1.00)
GFR calc Af Amer: >60 mL/min (ref >60)
GFR calc non Af Amer: >60 mL/min (ref >60)
Glucose, Bld: 161 mg/dL — ABNORMAL HIGH (ref 70–99)
Potassium: 3.5 mmol/L (ref 3.5–5.1)
Sodium: 138 mmol/L (ref 135–145)

## 2020-06-30 LAB — CBC
HCT: 34.1 % — ABNORMAL LOW (ref 36.0–46.0)
Hemoglobin: 9.5 g/dL — ABNORMAL LOW (ref 12.0–15.0)
MCH: 17.6 pg — ABNORMAL LOW (ref 26.0–34.0)
MCHC: 27.9 g/dL — ABNORMAL LOW (ref 30.0–36.0)
MCV: 63.3 fL — ABNORMAL LOW (ref 80.0–100.0)
Platelets: 740 10*3/uL — ABNORMAL HIGH (ref 150–400)
RBC: 5.39 MIL/uL — ABNORMAL HIGH (ref 3.87–5.11)
RDW: 25.5 % — ABNORMAL HIGH (ref 11.5–15.5)
WBC: 16.1 10*3/uL — ABNORMAL HIGH (ref 4.0–10.5)
nRBC: 0.5 % — ABNORMAL HIGH (ref 0.0–0.2)

## 2020-06-30 LAB — I-STAT BETA HCG BLOOD, ED (MC, WL, AP ONLY): I-stat hCG, quantitative: <5 m[IU]/mL (ref <5)

## 2020-07-02 ENCOUNTER — Inpatient Hospital Stay: Payer: Medicaid Other | Attending: Hematology and Oncology

## 2020-07-02 ENCOUNTER — Other Ambulatory Visit: Payer: Self-pay

## 2020-07-02 VITALS — BP 128/70 | HR 88 | Temp 98.2°F | Resp 18

## 2020-07-02 DIAGNOSIS — K909 Intestinal malabsorption, unspecified: Secondary | ICD-10-CM | POA: Diagnosis not present

## 2020-07-02 DIAGNOSIS — N920 Excessive and frequent menstruation with regular cycle: Secondary | ICD-10-CM | POA: Insufficient documentation

## 2020-07-02 DIAGNOSIS — D259 Leiomyoma of uterus, unspecified: Secondary | ICD-10-CM | POA: Diagnosis not present

## 2020-07-02 DIAGNOSIS — D500 Iron deficiency anemia secondary to blood loss (chronic): Secondary | ICD-10-CM | POA: Insufficient documentation

## 2020-07-02 DIAGNOSIS — D509 Iron deficiency anemia, unspecified: Secondary | ICD-10-CM

## 2020-07-02 MED ORDER — ONDANSETRON HCL 4 MG/2ML IJ SOLN
INTRAMUSCULAR | Status: AC
  Filled 2020-07-02: qty 4

## 2020-07-02 MED ORDER — ONDANSETRON HCL 4 MG/2ML IJ SOLN
8.0000 mg | Freq: Once | INTRAMUSCULAR | Status: AC
  Administered 2020-07-02: 8 mg via INTRAVENOUS

## 2020-07-02 MED ORDER — SODIUM CHLORIDE 0.9 % IV SOLN
300.0000 mg | Freq: Once | INTRAVENOUS | Status: AC
  Administered 2020-07-02: 300 mg via INTRAVENOUS
  Filled 2020-07-02: qty 10

## 2020-07-02 MED ORDER — SODIUM CHLORIDE 0.9 % IV SOLN
Freq: Once | INTRAVENOUS | Status: AC
  Filled 2020-07-02: qty 250

## 2020-07-02 NOTE — Progress Notes (Signed)
Patient declined post observation. Verbal education provided.

## 2020-07-02 NOTE — Patient Instructions (Signed)

## 2020-07-16 ENCOUNTER — Ambulatory Visit
Admission: RE | Admit: 2020-07-16 | Discharge: 2020-07-16 | Disposition: A | Discharge status: Home / Self Care | Payer: Medicaid Other | Source: Ambulatory Visit | Attending: Sports Medicine | Admitting: Sports Medicine

## 2020-07-16 DIAGNOSIS — M545 Low back pain, unspecified: Secondary | ICD-10-CM

## 2020-10-08 ENCOUNTER — Inpatient Hospital Stay: Payer: Medicaid Other | Attending: Hematology and Oncology

## 2020-10-08 ENCOUNTER — Other Ambulatory Visit: Payer: Self-pay

## 2020-10-08 DIAGNOSIS — Z79899 Other long term (current) drug therapy: Secondary | ICD-10-CM | POA: Diagnosis not present

## 2020-10-08 DIAGNOSIS — N920 Excessive and frequent menstruation with regular cycle: Secondary | ICD-10-CM | POA: Insufficient documentation

## 2020-10-08 DIAGNOSIS — Z7984 Long term (current) use of oral hypoglycemic drugs: Secondary | ICD-10-CM | POA: Diagnosis not present

## 2020-10-08 DIAGNOSIS — D500 Iron deficiency anemia secondary to blood loss (chronic): Secondary | ICD-10-CM | POA: Insufficient documentation

## 2020-10-08 LAB — IRON AND TIBC
Iron: 18 ug/dL — ABNORMAL LOW (ref 41–142)
Saturation Ratios: 5 % — ABNORMAL LOW (ref 21–57)
TIBC: 353 ug/dL (ref 236–444)
UIBC: 335 ug/dL (ref 120–384)

## 2020-10-08 LAB — CBC WITH DIFFERENTIAL (CANCER CENTER ONLY)
Abs Immature Granulocytes: 0.06 10*3/uL (ref 0.00–0.07)
Basophils Absolute: 0 10*3/uL (ref 0.0–0.1)
Basophils Relative: 0 %
Eosinophils Absolute: 0.1 10*3/uL (ref 0.0–0.5)
Eosinophils Relative: 1 %
HCT: 35.5 % — ABNORMAL LOW (ref 36.0–46.0)
Hemoglobin: 10.9 g/dL — ABNORMAL LOW (ref 12.0–15.0)
Immature Granulocytes: 1 %
Lymphocytes Relative: 24 %
Lymphs Abs: 3.1 10*3/uL (ref 0.7–4.0)
MCH: 20.6 pg — ABNORMAL LOW (ref 26.0–34.0)
MCHC: 30.7 g/dL (ref 30.0–36.0)
MCV: 67.2 fL — ABNORMAL LOW (ref 80.0–100.0)
Monocytes Absolute: 0.5 10*3/uL (ref 0.1–1.0)
Monocytes Relative: 4 %
Neutro Abs: 9.2 10*3/uL — ABNORMAL HIGH (ref 1.7–7.7)
Neutrophils Relative %: 70 %
Platelet Count: 598 10*3/uL — ABNORMAL HIGH (ref 150–400)
RBC: 5.28 MIL/uL — ABNORMAL HIGH (ref 3.87–5.11)
RDW: 20.6 % — ABNORMAL HIGH (ref 11.5–15.5)
WBC Count: 13 10*3/uL — ABNORMAL HIGH (ref 4.0–10.5)
nRBC: 0 % (ref 0.0–0.2)

## 2020-10-08 LAB — FERRITIN: Ferritin: 21 ng/mL (ref 11–307)

## 2020-10-14 NOTE — Progress Notes (Signed)
Patient Care Team: [facility] [place] as PCP - Samara Snide, MD (Inactive) as Referring Physician (Family Medicine)  DIAGNOSIS:    ICD-10-CM   1. Iron deficiency anemia, unspecified iron deficiency anemia type  D50.9     CHIEF COMPLIANT: Follow-up of iron deficiency anemia  INTERVAL HISTORY: [name] is a 44 y.o. with above-mentioned history of iron deficiency anemia who previously received IV iron therapy (last 07/02/20). Labs on 10/08/20 showed: Hg 10.9, HCT 35.5, iron saturation 5%, ferritin 21. She presents to the clinic today for follow-up.  Complains of severe fatigue, easy bruising, diffuse hives for which she takes prednisone daily.  The prednisone is causing moon facies.  She is craving for ice chips for the last 2 weeks.  She felt remarkably better after IV iron infusion.  Slowly her energy levels declined.  ALLERGIES:  is allergic to doxycycline, fish allergy, fish oil, citalopram, amoxicillin, other, penicillins, strawberry extract, celexa [citalopram hydrobromide], and gabapentin.  MEDICATIONS:  Current Outpatient Medications  Medication Sig Dispense Refill  . albuterol (PROAIR HFA) 108 (90 Base) MCG/ACT inhaler Use 2 puffs every 4 hours as needed for cough or wheeze.  May use 2 puffs 10-20 minutes prior to exercise. 1 Inhaler 0  . cetirizine (ZYRTEC) 10 MG tablet Take 10 mg by mouth daily.    . cyclobenzaprine (FLEXERIL) 10 MG tablet Take 10 mg by mouth 3 (three) times daily.    Marland Kitchen gabapentin (NEURONTIN) 100 MG capsule Take 1 capsule (100 mg total) by mouth 3 (three) times daily.    Marland Kitchen glipiZIDE (GLUCOTROL XL) 10 MG 24 hr tablet Take 10 mg by mouth daily with breakfast.    . ibuprofen (ADVIL,MOTRIN) 800 MG tablet Take 800 mg by mouth 2 (two) times daily.    Marland Kitchen losartan (COZAAR) 100 MG tablet Take 1 tablet (100 mg total) by mouth daily. 30 tablet 3  . metFORMIN (GLUCOPHAGE) 1000 MG tablet Take 1,000 mg by mouth 2 (two) times daily with a meal.    .  mometasone-formoterol (DULERA) 200-5 MCG/ACT AERO Inhale 2 puffs into the lungs 2 (two) times daily. 1 Inhaler 0  . montelukast (SINGULAIR) 10 MG tablet Take 10 mg by mouth at bedtime.     No current facility-administered medications for this visit.    PHYSICAL EXAMINATION: ECOG PERFORMANCE STATUS: 1 - Symptomatic but completely ambulatory  Vitals:   10/15/20 1534  BP: (!) 153/87  Pulse: (!) 108  Resp: 18  Temp: 98.4 F (36.9 C)  SpO2: 100%   Filed Weights   10/15/20 1534  Weight: (!) 462 lb 8 oz (209.8 kg)    LABORATORY DATA:  I have reviewed the data as listed CMP Latest Ref Rng & Units 06/29/2020 04/30/2020 08/27/2017  Glucose 70 - 99 mg/dL 161(H) 373(H) 162(H)  BUN 6 - 20 mg/dL 12 12 10   Creatinine 0.44 - 1.00 mg/dL 0.81 0.89 0.90  Sodium 135 - 145 mmol/L 138 137 135  Potassium 3.5 - 5.1 mmol/L 3.5 4.6 3.9  Chloride 98 - 111 mmol/L 103 100 101  CO2 22 - 32 mmol/L 22 24 23   Calcium 8.9 - 10.3 mg/dL 8.5(L) 8.6(L) 9.1  Total Protein 6.0 - 8.3 g/dL - - -  Total Bilirubin 0.3 - 1.2 mg/dL - - -  Alkaline Phos 39 - 117 U/L - - -  AST 0 - 37 U/L - - -  ALT 0 - 35 U/L - - -    Lab Results  Component Value Date  WBC 13.0 (H) 10/08/2020   HGB 10.9 (L) 10/08/2020   HCT 35.5 (L) 10/08/2020   MCV 67.2 (L) 10/08/2020   PLT 598 (H) 10/08/2020   NEUTROABS 9.2 (H) 10/08/2020    ASSESSMENT & PLAN:  Iron deficiency anemia Most likely cause: Uterine bleeding from fibroids plus malabsorption IV iron treatment: 09/28/2012,January 2020 Longstanding history of iron deficiency(all her life)  Blood work review  09/03/2018: Hemoglobin 9.8, MCV 61, platelet 663 02/26/2019:Iron saturation 12%, ferritin 35improved from 9, hemoglobin 12 improved from 10.3 08/27/2019: Hemoglobin 11, MCV 66, platelets 592, iron saturation 5%, ferritin 28 04/30/2020: Hemoglobin 9.4, MCV 61.9, platelets 624 10/08/2020: Hemoglobin 10.9, MCV 67.2, iron saturation 5%, ferritin 21, platelets 598  IV iron  treatment: 11/29/2018, November 2020, August 2021  Based on the blood work and her craving for ice chips and the symptoms, I recommended that she received 3 doses of Venofer.  She might need IV iron once every 3 months. Severe obesity: Patient wants to consider gastric bypass surgery but she has not pursued it yet.  Return to clinic in 4 months with labs done ahead of time and follow-up.  No orders of the defined types were placed in this encounter.  The patient has a good understanding of the overall plan. she agrees with it. she will call with any problems that may develop before the next visit here.  Total time spent: 20 mins including face to face time and time spent for planning, charting and coordination of care  Nicholas Lose, MD 10/15/2020  I, Cloyde Reams Dorshimer, am acting as scribe for Dr. Nicholas Lose.  I have reviewed the above documentation for accuracy and completeness, and I agree with the above.

## 2020-10-15 ENCOUNTER — Inpatient Hospital Stay (HOSPITAL_BASED_OUTPATIENT_CLINIC_OR_DEPARTMENT_OTHER): Payer: Medicaid Other | Admitting: Hematology and Oncology

## 2020-10-15 ENCOUNTER — Other Ambulatory Visit: Payer: Self-pay

## 2020-10-15 DIAGNOSIS — D509 Iron deficiency anemia, unspecified: Secondary | ICD-10-CM | POA: Diagnosis not present

## 2020-10-15 DIAGNOSIS — D500 Iron deficiency anemia secondary to blood loss (chronic): Secondary | ICD-10-CM | POA: Diagnosis not present

## 2020-10-15 NOTE — Assessment & Plan Note (Signed)
Most likely cause: Uterine bleeding from fibroids plus malabsorption IV iron treatment: 09/28/2012,January 2020 Longstanding history of iron deficiency(all her life)  Blood work review  09/03/2018: Hemoglobin 9.8, MCV 61, platelet 663 02/26/2019:Iron saturation 12%, ferritin 35improved from 9, hemoglobin 12 improved from 10.3 08/27/2019: Hemoglobin 11, MCV 66, platelets 592, iron saturation 5%, ferritin 28 04/30/2020: Hemoglobin 9.4, MCV 61.9, platelets 624 10/08/2020: Hemoglobin 10.9, MCV 67.2, iron saturation 5%, ferritin 21, platelets 598  IV iron treatment: 11/29/2018, November 2020, August 2021  Based on the blood work she does not need IV iron at this time but I discussed with her that the iron saturation is still running low at 5%.  She might need iron in 3 months. Return to clinic with labs ahead of time and follow-up in 3 months. Severe obesity: Patient wants to consider gastric bypass surgery but she has not pursued it yet.

## 2020-10-20 ENCOUNTER — Telehealth: Payer: Self-pay | Admitting: Hematology and Oncology

## 2020-10-20 NOTE — Telephone Encounter (Signed)
Scheduled per 12/15 los. Called pt and left a msg

## 2020-11-03 ENCOUNTER — Telehealth: Payer: Self-pay | Admitting: Hematology and Oncology

## 2020-11-03 ENCOUNTER — Inpatient Hospital Stay: Payer: Medicaid Other

## 2020-11-03 NOTE — Telephone Encounter (Signed)
Scheduled appts per 1/3 sch msg. Pt to get updated appt calendar at next visit per appt notes.

## 2020-11-04 IMAGING — CT CT L SPINE W/O CM
1 of 7 series · 5 of 14 positions shown, 7 images · non-contrast
Comparison: Radiography 06/04/2009

CLINICAL DATA: Low back pain and right leg pain over the last 5
months. Difficulty walking or standing.

EXAM:
CT LUMBAR SPINE WITHOUT CONTRAST
TECHNIQUE: Multidetector CT imaging of the lumbar spine was performed without
intravenous contrast administration. Multiplanar CT image
reconstructions were also generated.

[Series 3: l spine soft · axial · 0.44mm/px · z∈[-216,-50]mm · 5 of 83 slices shown, 7 images]
[im 14/83  soft-tissue]
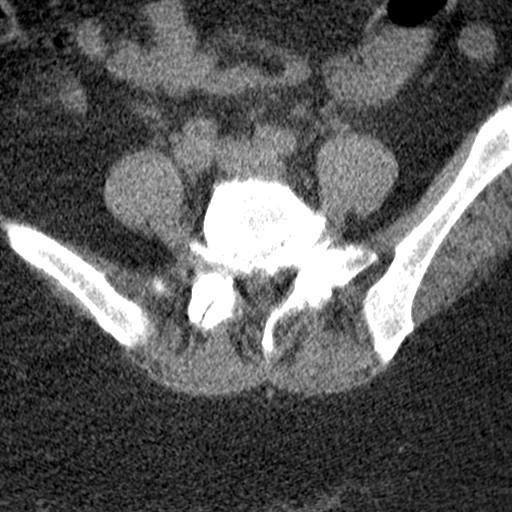
[im 14/83  bone]
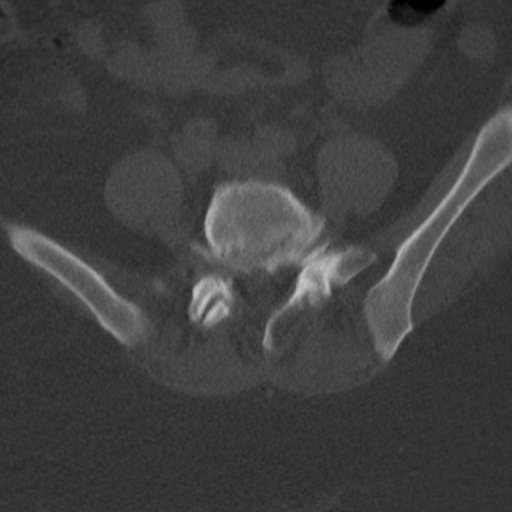
[im 28/83  bone]
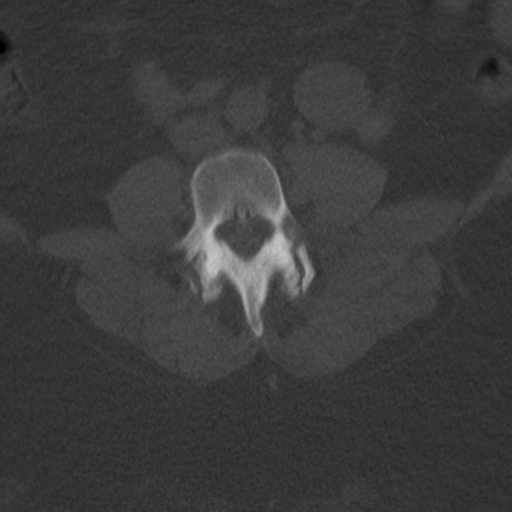
[im 42/83  bone]
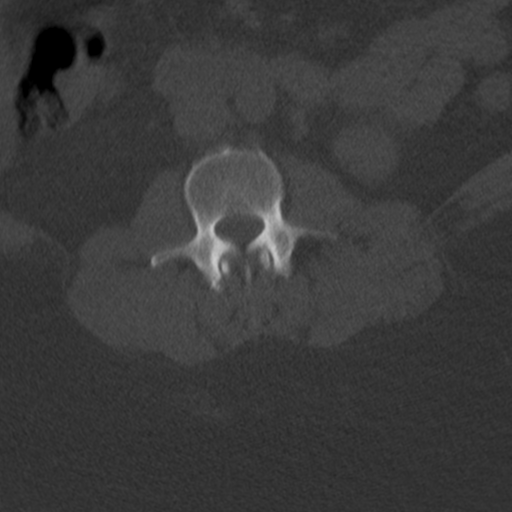
[im 55/83  bone]
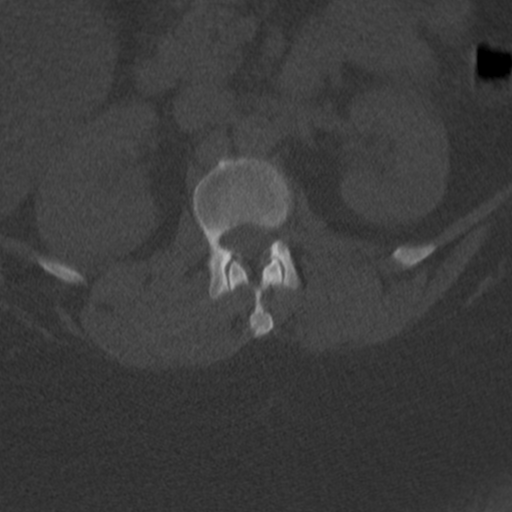
[im 69/83  soft-tissue]
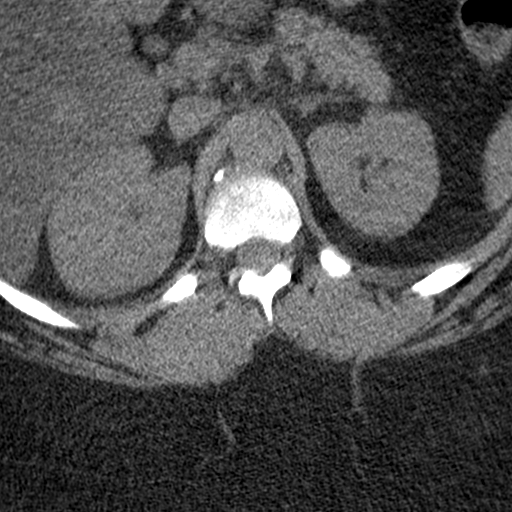
[im 69/83  bone]
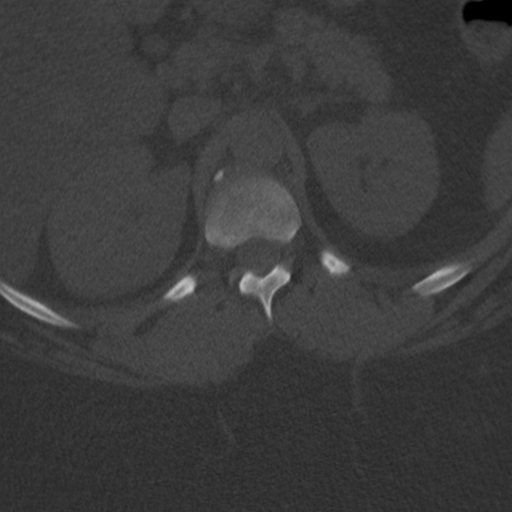

[5 of 14 positions shown; findings below may reference images not displayed]

FINDINGS: Segmentation: 5 lumbar type vertebral bodies.

Alignment: Mild curvature convex to the right. No antero or
retrolisthesis in the supine position.

Vertebrae: No fracture or primary bone lesion. See below regarding
degenerative findings.

Paraspinal and other soft tissues: Negative

Disc levels: No significant disc level pathology seen from T11-12
through L1-2.

L2-3: No disc abnormality. Facet degeneration and ligamentous
hypertrophy. No compressive stenosis.

L3-4: Minimal disc bulge. Bilateral facet arthropathy with
ligamentous hypertrophy and calcification. Mild narrowing of the
lateral recesses without likely neural compression.

L4-5: Bulging of the disc. Pronounced bilateral facet arthropathy
and ligamentous hypertrophy and calcification. Moderate canal
stenosis at this level could cause neural compression on either or
both sides. The appearance could worsen with standing or flexion
based on the morphology of the facet arthropathy.

L5-S1: Chronic disc degeneration with loss of disc height and vacuum
phenomenon. Prominent endplate osteophytes. Bilateral facet
osteoarthritis with prominent osteophytes. Stenosis of both neural
foramina that could compress either or both L5 nerves. Central canal
appears sufficiently patent.

Bilateral sacroiliac osteoarthritis which could be painful.
IMPRESSION: 1. L3-4: Minimal disc bulge. Bilateral facet arthropathy with
ligamentous hypertrophy and calcification. Mild narrowing of the
lateral recesses without likely neural compression.
2. L4-5: Bulging of the disc. Pronounced bilateral facet arthropathy
and ligamentous hypertrophy and calcification. Moderate canal
stenosis at this level could cause neural compression on either or
both sides. The appearance could worsen with standing or flexion
based on the morphology of the facet arthropathy.
3. L5-S1: Chronic disc degeneration with loss of disc height and
vacuum phenomenon. Prominent endplate osteophytes and facet
osteophytes. Stenosis of both neural foramina that could compress
either or both L5 nerves.
4. Bilateral sacroiliac osteoarthritis which could be painful.

## 2020-11-05 ENCOUNTER — Other Ambulatory Visit: Payer: Self-pay

## 2020-11-05 ENCOUNTER — Inpatient Hospital Stay: Payer: Medicaid Other | Attending: Hematology and Oncology

## 2020-11-05 VITALS — BP 97/65 | HR 87 | Temp 98.5°F | Resp 18

## 2020-11-05 DIAGNOSIS — D509 Iron deficiency anemia, unspecified: Secondary | ICD-10-CM

## 2020-11-05 DIAGNOSIS — D500 Iron deficiency anemia secondary to blood loss (chronic): Secondary | ICD-10-CM | POA: Diagnosis present

## 2020-11-05 DIAGNOSIS — Z79899 Other long term (current) drug therapy: Secondary | ICD-10-CM | POA: Diagnosis not present

## 2020-11-05 MED ORDER — SODIUM CHLORIDE 0.9 % IV SOLN
300.0000 mg | Freq: Once | INTRAVENOUS | Status: AC
  Administered 2020-11-05: 300 mg via INTRAVENOUS
  Filled 2020-11-05: qty 10

## 2020-11-05 MED ORDER — SODIUM CHLORIDE 0.9 % IV SOLN
Freq: Once | INTRAVENOUS | Status: AC
  Filled 2020-11-05: qty 250

## 2020-11-05 NOTE — Progress Notes (Signed)
Patient declined to stay for her observation period following her venofer infusion. Patient's vitals retaken and remained stable. Patient in no acute distress at discharge.

## 2020-11-05 NOTE — Patient Instructions (Signed)

## 2020-11-10 ENCOUNTER — Ambulatory Visit: Payer: Medicaid Other

## 2020-11-17 ENCOUNTER — Ambulatory Visit: Payer: Medicaid Other

## 2020-11-20 ENCOUNTER — Telehealth: Payer: Self-pay | Admitting: Hematology and Oncology

## 2020-11-20 NOTE — Telephone Encounter (Signed)
Rescheduled appt per 1/17 sch msg - unable to reach pt . Left message with appt date and time

## 2020-11-23 ENCOUNTER — Inpatient Hospital Stay: Payer: Medicaid Other

## 2020-11-23 ENCOUNTER — Telehealth: Payer: Self-pay | Admitting: *Deleted

## 2020-11-23 NOTE — Telephone Encounter (Signed)
PC to patient asking if she is coming for infusion today, pt states she didn't know she had an appointment.  Will leave message for scheduling to contact patient for another appointment.

## 2020-11-24 ENCOUNTER — Telehealth: Payer: Self-pay | Admitting: Hematology and Oncology

## 2020-11-24 NOTE — Telephone Encounter (Signed)
R/s appt per 1/23 sch msg- left  Message for pt with appt date and time

## 2020-11-26 ENCOUNTER — Inpatient Hospital Stay: Payer: Medicaid Other

## 2020-12-25 ENCOUNTER — Telehealth: Payer: Self-pay | Admitting: Hematology and Oncology

## 2020-12-25 NOTE — Telephone Encounter (Signed)
Called pt with added appts no answer, left pt a msg

## 2021-01-02 ENCOUNTER — Inpatient Hospital Stay: Payer: Medicaid Other

## 2021-01-09 ENCOUNTER — Inpatient Hospital Stay: Payer: Medicaid Other

## 2021-02-10 ENCOUNTER — Inpatient Hospital Stay: Payer: Medicaid Other | Attending: Hematology and Oncology

## 2021-02-10 ENCOUNTER — Other Ambulatory Visit: Payer: Self-pay

## 2021-02-10 DIAGNOSIS — D509 Iron deficiency anemia, unspecified: Secondary | ICD-10-CM | POA: Diagnosis present

## 2021-02-10 LAB — CBC WITH DIFFERENTIAL (CANCER CENTER ONLY)
Abs Immature Granulocytes: 0.05 10*3/uL (ref 0.00–0.07)
Basophils Absolute: 0 10*3/uL (ref 0.0–0.1)
Basophils Relative: 0 %
Eosinophils Absolute: 0.4 10*3/uL (ref 0.0–0.5)
Eosinophils Relative: 3 %
HCT: 41.7 % (ref 36.0–46.0)
Hemoglobin: 12.8 g/dL (ref 12.0–15.0)
Immature Granulocytes: 0 %
Lymphocytes Relative: 26 %
Lymphs Abs: 3.5 10*3/uL (ref 0.7–4.0)
MCH: 20.8 pg — ABNORMAL LOW (ref 26.0–34.0)
MCHC: 30.7 g/dL (ref 30.0–36.0)
MCV: 67.9 fL — ABNORMAL LOW (ref 80.0–100.0)
Monocytes Absolute: 0.5 10*3/uL (ref 0.1–1.0)
Monocytes Relative: 4 %
Neutro Abs: 9.2 10*3/uL — ABNORMAL HIGH (ref 1.7–7.7)
Neutrophils Relative %: 67 %
Platelet Count: 682 10*3/uL — ABNORMAL HIGH (ref 150–400)
RBC: 6.14 MIL/uL — ABNORMAL HIGH (ref 3.87–5.11)
RDW: 19.4 % — ABNORMAL HIGH (ref 11.5–15.5)
WBC Count: 13.7 10*3/uL — ABNORMAL HIGH (ref 4.0–10.5)
nRBC: 0 % (ref 0.0–0.2)

## 2021-02-10 NOTE — Progress Notes (Signed)
error    This encounter was created in error - please disregard.

## 2021-02-10 NOTE — Assessment & Plan Note (Signed)
Most likely cause: Uterine bleeding from fibroids plus malabsorption IV iron treatment: 09/28/2012,January 2020 Longstanding history of iron deficiency(all her life)  Blood work review  09/03/2018: Hemoglobin 9.8, MCV 61, platelet 663 02/26/2019:Iron saturation 12%, ferritin 35improved from 9, hemoglobin 12 improved from 10.3 08/27/2019: Hemoglobin 11, MCV 66, platelets 592, iron saturation 5%, ferritin 28 04/30/2020: Hemoglobin 9.4, MCV 61.9, platelets 624 10/08/2020: Hemoglobin 10.9, MCV 67.2, iron saturation 5%, ferritin 21, platelets 598 02/10/21: Hb 12.8, MCV 67.9  IV iron treatment: 11/29/2018, November 2020, August 2021, Jan 2022  She might need IV iron once every 3 months. Severe obesity: Patient wants to consider gastric bypass surgery but she has not pursued it yet.  Return to clinic in 3 months with labs done ahead of time and follow-up.

## 2021-02-11 ENCOUNTER — Inpatient Hospital Stay: Payer: Medicaid Other | Admitting: Hematology and Oncology

## 2021-02-11 DIAGNOSIS — D500 Iron deficiency anemia secondary to blood loss (chronic): Secondary | ICD-10-CM

## 2021-02-11 LAB — IRON AND TIBC
Iron: 29 ug/dL — ABNORMAL LOW (ref 41–142)
Saturation Ratios: 8 % — ABNORMAL LOW (ref 21–57)
TIBC: 363 ug/dL (ref 236–444)
UIBC: 334 ug/dL (ref 120–384)

## 2021-02-11 LAB — FERRITIN: Ferritin: 36 ng/mL (ref 11–307)

## 2021-02-12 ENCOUNTER — Ambulatory Visit: Payer: Medicaid Other | Admitting: Hematology and Oncology

## 2021-02-17 ENCOUNTER — Telehealth: Payer: Self-pay | Admitting: Hematology and Oncology

## 2021-02-17 NOTE — Telephone Encounter (Signed)
Scheduled per 4/13 los. Called and spoke with pt confirmed 7/12 and 7/13 appts

## 2021-04-07 DIAGNOSIS — Z0289 Encounter for other administrative examinations: Secondary | ICD-10-CM

## 2021-04-13 ENCOUNTER — Other Ambulatory Visit: Payer: Self-pay | Admitting: Physician Assistant

## 2021-04-13 DIAGNOSIS — M79604 Pain in right leg: Secondary | ICD-10-CM

## 2021-04-15 ENCOUNTER — Emergency Department (HOSPITAL_COMMUNITY)
Admission: EM | Admit: 2021-04-15 | Discharge: 2021-04-15 | Disposition: A | Discharge status: Home / Self Care | Payer: Medicaid Other | Attending: Emergency Medicine | Admitting: Emergency Medicine

## 2021-04-15 ENCOUNTER — Other Ambulatory Visit: Payer: Self-pay

## 2021-04-15 DIAGNOSIS — G8929 Other chronic pain: Secondary | ICD-10-CM | POA: Insufficient documentation

## 2021-04-15 DIAGNOSIS — M549 Dorsalgia, unspecified: Secondary | ICD-10-CM | POA: Insufficient documentation

## 2021-04-15 DIAGNOSIS — Z5321 Procedure and treatment not carried out due to patient leaving prior to being seen by health care provider: Secondary | ICD-10-CM | POA: Insufficient documentation

## 2021-04-15 NOTE — ED Triage Notes (Signed)
Per EMS-chronic back pain-history of scoliosis-called PCP and they could not see her today

## 2021-04-16 ENCOUNTER — Other Ambulatory Visit: Payer: Self-pay

## 2021-04-16 ENCOUNTER — Emergency Department (HOSPITAL_COMMUNITY)
Admission: EM | Admit: 2021-04-16 | Discharge: 2021-04-16 | Disposition: A | Discharge status: Home / Self Care | Payer: Medicaid Other | Attending: Emergency Medicine | Admitting: Emergency Medicine

## 2021-04-16 ENCOUNTER — Emergency Department (HOSPITAL_BASED_OUTPATIENT_CLINIC_OR_DEPARTMENT_OTHER)
Admission: EM | Admit: 2021-04-16 | Discharge: 2021-04-16 | Disposition: A | Discharge status: Home / Self Care | Payer: Medicaid Other | Source: Home / Self Care | Attending: Emergency Medicine | Admitting: Emergency Medicine

## 2021-04-16 ENCOUNTER — Encounter (HOSPITAL_BASED_OUTPATIENT_CLINIC_OR_DEPARTMENT_OTHER): Payer: Self-pay | Admitting: *Deleted

## 2021-04-16 DIAGNOSIS — Z79899 Other long term (current) drug therapy: Secondary | ICD-10-CM | POA: Insufficient documentation

## 2021-04-16 DIAGNOSIS — M545 Low back pain, unspecified: Secondary | ICD-10-CM | POA: Diagnosis present

## 2021-04-16 DIAGNOSIS — M5416 Radiculopathy, lumbar region: Secondary | ICD-10-CM | POA: Diagnosis not present

## 2021-04-16 DIAGNOSIS — Z7984 Long term (current) use of oral hypoglycemic drugs: Secondary | ICD-10-CM | POA: Diagnosis not present

## 2021-04-16 DIAGNOSIS — I10 Essential (primary) hypertension: Secondary | ICD-10-CM | POA: Insufficient documentation

## 2021-04-16 DIAGNOSIS — E1169 Type 2 diabetes mellitus with other specified complication: Secondary | ICD-10-CM | POA: Insufficient documentation

## 2021-04-16 DIAGNOSIS — G8929 Other chronic pain: Secondary | ICD-10-CM | POA: Insufficient documentation

## 2021-04-16 DIAGNOSIS — M5441 Lumbago with sciatica, right side: Secondary | ICD-10-CM | POA: Insufficient documentation

## 2021-04-16 DIAGNOSIS — E119 Type 2 diabetes mellitus without complications: Secondary | ICD-10-CM | POA: Diagnosis not present

## 2021-04-16 DIAGNOSIS — Z7951 Long term (current) use of inhaled steroids: Secondary | ICD-10-CM | POA: Insufficient documentation

## 2021-04-16 DIAGNOSIS — J45909 Unspecified asthma, uncomplicated: Secondary | ICD-10-CM | POA: Diagnosis not present

## 2021-04-16 MED ORDER — KETOROLAC TROMETHAMINE 30 MG/ML IJ SOLN
30.0000 mg | Freq: Once | INTRAMUSCULAR | Status: AC
  Administered 2021-04-16: 30 mg via INTRAMUSCULAR
  Filled 2021-04-16: qty 1

## 2021-04-16 MED ORDER — METHOCARBAMOL 500 MG PO TABS
500.0000 mg | ORAL_TABLET | Freq: Once | ORAL | Status: AC
  Administered 2021-04-16: 500 mg via ORAL
  Filled 2021-04-16: qty 1

## 2021-04-16 MED ORDER — HYDROMORPHONE HCL 2 MG/ML IJ SOLN
2.0000 mg | Freq: Once | INTRAMUSCULAR | Status: AC
  Administered 2021-04-16: 2 mg via INTRAMUSCULAR
  Filled 2021-04-16: qty 1

## 2021-04-16 MED ORDER — OXYCODONE HCL 5 MG PO TABS
15.0000 mg | ORAL_TABLET | Freq: Once | ORAL | Status: AC
  Administered 2021-04-16: 15 mg via ORAL
  Filled 2021-04-16: qty 3

## 2021-04-16 NOTE — ED Triage Notes (Signed)
Pt  was just discharged from [facility] with the c/o right lower back pain radiating to her right leg and a lump to her right lower leg.  Came in today via ambulance with the same complaint.

## 2021-04-16 NOTE — ED Triage Notes (Signed)
Patient is A&Ox4 from home she is here with complaints of left leg and hip pain. There is a lump below the left knee.   Hx: blood clots, diabetes, and sickle cell trait.  CBG: 300  Hr: 99 Spo2: 97% RA

## 2021-04-16 NOTE — ED Provider Notes (Signed)
[facility] DEPT Provider Note   CSN: [id number] Arrival date & time: 04/16/21  0052     History Chief Complaint  Patient presents with   Hip Pain   Leg Pain    [name] is a 45 y.o. female.  Patient with chronic low back and radicular right leg pain presents with uncontrolled pain. She reports being out of her oxycodone 15 mg prescribed through Pain [facility] for the past 2 days. She has a prescription waiting to be picked up tomorrow. No new symptoms, no injury or fall. No bowel or bladder incontinence. No abdominal pain, fever.   The history is provided by the patient. No language interpreter was used.  Hip Pain  Leg Pain Associated symptoms: back pain   Associated symptoms: no fever       Past Medical History:  Diagnosis Date   Allergic rhinitis    Anemia    Anxiety disorder    Asthma    Benign essential hypertension    Depression    Diabetes mellitus without complication ([place])    Edema    History of blood clots    Hyperglycemia    Lumbar radiculopathy    Metrorrhagia    Morbid obesity ([facility])    OSA (obstructive sleep apnea)    Sickle cell trait ([place])    Snoring     Patient Active Problem List   Diagnosis Date Noted   Menorrhagia 03/01/2018   Fibroids 03/01/2018   Obesity, morbid ([place]) 12/24/2013   Obesity hypoventilation syndrome ([place]) 12/24/2013   Iron deficiency anemia 09/29/2012   Pulmonary embolism ([place]) 09/26/2012   SOB (shortness of breath) 09/25/2012   Anemia 09/25/2012   PALPITATIONS 03/10/2010   DEPRESSION 12/09/2009   MENORRHAGIA 12/09/2009   ROTATOR CUFF INJURY, LEFT SHOULDER 12/09/2009   PANIC DISORDER 07/17/2009   LUMBAR RADICULOPATHY, LEFT 07/17/2009   SLEEP APNEA, OBSTRUCTIVE 11/05/2008   PERIPHERAL EDEMA 11/05/2008   URINALYSIS, ABNORMAL 11/05/2008   Morbid obesity ([place]) 06/09/2007   HYPERTENSION, BENIGN ESSENTIAL 06/09/2007   ALLERGIC RHINITIS 06/09/2007   ASTHMA 06/09/2007   URTICARIA  06/09/2007   ANXIETY DISORDER, HX OF 06/09/2007   DEPRESSION, HX OF 06/09/2007   HYPERGLYCEMIA 06/02/2007   CYST, VULVA 07/25/2006   ANEMIA, IRON DEFICIENCY 12/28/2005   ECHOCARDIOGRAM, ABNORMAL 01/25/2005   Personal history of venous thrombosis and embolism 01/24/2005    Past Surgical History:  Procedure Laterality Date   CESAREAN SECTION     CHOLECYSTECTOMY     TUBAL LIGATION       OB History     Gravida  3   Para      Term      Preterm      AB      Living  3      SAB      IAB      Ectopic      Multiple      Live Births  2           Family History  Problem Relation Age of Onset   Leukemia Father    Diabetes Father    Asthma Mother    Hypertension Mother    Breast cancer Maternal Aunt    Breast cancer Paternal Aunt    Diabetes Brother     Social History   Tobacco Use   Smoking status: Never   Smokeless tobacco: Never  Vaping Use   Vaping Use: Never used  Substance Use Topics   Alcohol use:  Yes   Drug use: No    Home Medications Prior to Admission medications   Medication Sig Start Date End Date Taking? Authorizing Provider  albuterol (PROAIR HFA) 108 (90 Base) MCG/ACT inhaler Use 2 puffs every 4 hours as needed for cough or wheeze.  May use 2 puffs 10-20 minutes prior to exercise. 02/09/16  Yes Bardelas, Jens Som, MD  cetirizine (ZYRTEC) 10 MG tablet Take 10 mg by mouth daily.   Yes [provider]  cyclobenzaprine (FLEXERIL) 10 MG tablet Take 10 mg by mouth 3 (three) times daily.   Yes [provider]  gabapentin (NEURONTIN) 100 MG capsule Take 1 capsule (100 mg total) by mouth 3 (three) times daily. 06/13/20  Yes Nicholas Lose, MD  glipiZIDE (GLUCOTROL XL) 10 MG 24 hr tablet Take 10 mg by mouth daily with breakfast.   Yes [provider]  ibuprofen (ADVIL,MOTRIN) 800 MG tablet Take 800 mg by mouth 2 (two) times daily.   Yes [provider]  LINZESS 72 MCG capsule Take 72 mcg by mouth daily as needed.  01/15/21  Yes [provider]  losartan (COZAAR) 100 MG tablet Take 1 tablet (100 mg total) by mouth daily. 02/02/13  Yes Thurnell Lose, MD  metFORMIN (GLUCOPHAGE) 1000 MG tablet Take 1,000 mg by mouth 2 (two) times daily with a meal.   Yes [provider]  metoprolol succinate (TOPROL-XL) 50 MG 24 hr tablet Take 50 mg by mouth daily. 02/16/21  Yes [provider]  mometasone-formoterol (DULERA) 200-5 MCG/ACT AERO Inhale 2 puffs into the lungs 2 (two) times daily. 11/11/15  Yes Bardelas, Jose A, MD  montelukast (SINGULAIR) 10 MG tablet Take 10 mg by mouth at bedtime.   Yes [provider]  oxyCODONE (ROXICODONE) 15 MG immediate release tablet Take 15 mg by mouth 4 (four) times daily as needed. 03/20/21  Yes [provider]  SUMAtriptan (IMITREX) 100 MG tablet Take 100 mg by mouth as directed. 03/16/21  Yes [provider]  topiramate (TOPAMAX) 50 MG tablet Take 50 mg by mouth at bedtime. 01/19/21  Yes [provider]    Allergies    Doxycycline, Fish allergy, Fish oil, Citalopram, Amoxicillin, Other, Penicillins, and Strawberry extract  Review of Systems   Review of Systems  Constitutional:  Negative for fever.  Respiratory: Negative.    Cardiovascular: Negative.   Gastrointestinal: Negative.   Genitourinary:  Negative for enuresis.  Musculoskeletal:  Positive for back pain.  Skin:  Negative for color change.  Neurological:  Positive for numbness (Right lower extremity). Negative for weakness.   Physical Exam Updated Vital Signs BP (!) 146/97   Pulse 93   Temp 98.3 F (36.8 C)   Resp 14   Ht 5\' 8"  (1.727 m)   Wt (!) 185.1 kg   SpO2 100%   BMI 62.04 kg/m   Physical Exam Vitals and nursing note reviewed.  Constitutional:      Appearance: Normal appearance. She is obese.  Pulmonary:     Effort: Pulmonary effort is normal.  Abdominal:     Palpations: Abdomen is soft.     Comments: Non-tender.  Musculoskeletal:      Comments: Tender to right paralumbar back. No redness. FROM LE's. No strength deficits. Distal pulses present.   Neurological:     Mental Status: She is alert.    ED Results / Procedures / Treatments   Labs (all labs ordered are listed, but only abnormal results are displayed) Labs Reviewed - No  data to display  EKG None  Radiology No results found.  Procedures Procedures   Medications Ordered in ED Medications  HYDROmorphone (DILAUDID) injection 2 mg (has no administration in time range)  methocarbamol (ROBAXIN) tablet 500 mg (has no administration in time range)    ED Course  I have reviewed the triage vital signs and the nursing notes.  Pertinent labs & imaging results that were available during my care of the patient were reviewed by me and considered in my medical decision making (see chart for details).    MDM Rules/Calculators/A&P                          Patient to ED with uncontrolled chronic low back pain with radiation into the right leg, "worse than usual". Out of pain medications til tomorrow.   No neurologic deficits. Exam c/w lumbar radiculopathy. IM Dilaudid, oral muscle relaxer provided. Encouraged to get her medications and follow up as planned.   Final Clinical Impression(s) / ED Diagnoses Final diagnoses:  None   Low back pain Lumbar radiculopathy  Rx / DC Orders ED Discharge Orders     None        Charlann Lange, PA-C 04/16/21 0410    Veryl Speak, MD 04/16/21 [phone]

## 2021-04-16 NOTE — Discharge Instructions (Addendum)
Take your medication as prescribed and follow up with your doctor and with the spine specialist as planned.

## 2021-04-16 NOTE — Discharge Instructions (Addendum)
Restart your chronic pain management medications tomorrow as directed.  Rest today.

## 2021-04-16 NOTE — ED Provider Notes (Signed)
[facility] EMERGENCY DEPT Provider Note   CSN: [id number] Arrival date & time: 04/16/21  1401     History Chief Complaint  Patient presents with   Sciatica    [name] is a 45 y.o. female.  Patient with a history of chronic right-sided back pain and right-sided sciatica.  Being followed by pain management as well as orthopedics and also by spine surgery.  Patient seen at [facility] earlier today at about 4 in the morning.  Patient given IM hydromorphone for the pain.  Patient is under pain management.  She does not have her normal oxycodone 15 mg available until tomorrow.  Because she is gone through her 30-day supply early.  Patient states the pain is mostly right lumbar part of the back radiates into the leg.  Associated with baseline numbness and weakness to the foot.  Patient states this is all chronic nothing significantly new or worse      Past Medical History:  Diagnosis Date   Allergic rhinitis    Anemia    Anxiety disorder    Asthma    Benign essential hypertension    Depression    Diabetes mellitus without complication ([place])    Edema    History of blood clots    Hyperglycemia    Lumbar radiculopathy    Metrorrhagia    Morbid obesity (HCC)    OSA (obstructive sleep apnea)    Sickle cell trait ([place])    Snoring     Patient Active Problem List   Diagnosis Date Noted   Menorrhagia 03/01/2018   Fibroids 03/01/2018   Obesity, morbid ([place]) 12/24/2013   Obesity hypoventilation syndrome ([place]) 12/24/2013   Iron deficiency anemia 09/29/2012   Pulmonary embolism ([place]) 09/26/2012   SOB (shortness of breath) 09/25/2012   Anemia 09/25/2012   PALPITATIONS 03/10/2010   DEPRESSION 12/09/2009   MENORRHAGIA 12/09/2009   ROTATOR CUFF INJURY, LEFT SHOULDER 12/09/2009   PANIC DISORDER 07/17/2009   LUMBAR RADICULOPATHY, LEFT 07/17/2009   SLEEP APNEA, OBSTRUCTIVE 11/05/2008   PERIPHERAL EDEMA 11/05/2008   URINALYSIS, ABNORMAL 11/05/2008   Morbid obesity  ([place]) 06/09/2007   HYPERTENSION, BENIGN ESSENTIAL 06/09/2007   ALLERGIC RHINITIS 06/09/2007   ASTHMA 06/09/2007   URTICARIA 06/09/2007   ANXIETY DISORDER, HX OF 06/09/2007   DEPRESSION, HX OF 06/09/2007   HYPERGLYCEMIA 06/02/2007   CYST, VULVA 07/25/2006   ANEMIA, IRON DEFICIENCY 12/28/2005   ECHOCARDIOGRAM, ABNORMAL 01/25/2005   Personal history of venous thrombosis and embolism 01/24/2005    Past Surgical History:  Procedure Laterality Date   CESAREAN SECTION     CHOLECYSTECTOMY     TUBAL LIGATION       OB History     Gravida  3   Para      Term      Preterm      AB      Living  3      SAB      IAB      Ectopic      Multiple      Live Births  2           Family History  Problem Relation Age of Onset   Leukemia Father    Diabetes Father    Asthma Mother    Hypertension Mother    Breast cancer Maternal Aunt    Breast cancer Paternal Aunt    Diabetes Brother     Social History   Tobacco Use   Smoking status: Never  Smokeless tobacco: Never  Vaping Use   Vaping Use: Never used  Substance Use Topics   Alcohol use: Yes   Drug use: No    Home Medications Prior to Admission medications   Medication Sig Start Date End Date Taking? Authorizing Provider  cyclobenzaprine (FLEXERIL) 10 MG tablet Take 10 mg by mouth 3 (three) times daily.   Yes [provider]  losartan (COZAAR) 100 MG tablet Take 1 tablet (100 mg total) by mouth daily. 02/02/13  Yes Thurnell Lose, MD  metFORMIN (GLUCOPHAGE) 1000 MG tablet Take 1,000 mg by mouth 2 (two) times daily with a meal.   Yes [provider]  metoprolol succinate (TOPROL-XL) 50 MG 24 hr tablet Take 50 mg by mouth daily. 02/16/21  Yes [provider]  albuterol (PROAIR HFA) 108 (90 Base) MCG/ACT inhaler Use 2 puffs every 4 hours as needed for cough or wheeze.  May use 2 puffs 10-20 minutes prior to exercise. 02/09/16   Charlies Silvers, MD  cetirizine (ZYRTEC) 10 MG tablet Take  10 mg by mouth daily.    [provider]  gabapentin (NEURONTIN) 100 MG capsule Take 1 capsule (100 mg total) by mouth 3 (three) times daily. 06/13/20   Nicholas Lose, MD  glipiZIDE (GLUCOTROL XL) 10 MG 24 hr tablet Take 10 mg by mouth daily with breakfast.    [provider]  ibuprofen (ADVIL,MOTRIN) 800 MG tablet Take 800 mg by mouth 2 (two) times daily.    [provider]  LINZESS 72 MCG capsule Take 72 mcg by mouth daily as needed. 01/15/21   [provider]  mometasone-formoterol (DULERA) 200-5 MCG/ACT AERO Inhale 2 puffs into the lungs 2 (two) times daily. 11/11/15   Charlies Silvers, MD  montelukast (SINGULAIR) 10 MG tablet Take 10 mg by mouth at bedtime.    [provider]  oxyCODONE (ROXICODONE) 15 MG immediate release tablet Take 15 mg by mouth 4 (four) times daily as needed. 03/20/21   [provider]  SUMAtriptan (IMITREX) 100 MG tablet Take 100 mg by mouth as directed. 03/16/21   [provider]  topiramate (TOPAMAX) 50 MG tablet Take 50 mg by mouth at bedtime. 01/19/21   [provider]    Allergies    Doxycycline, Fish allergy, Fish oil, Citalopram, Amoxicillin, Other, Penicillins, and Strawberry extract  Review of Systems   Review of Systems  Constitutional:  Negative for chills and fever.  HENT:  Negative for ear pain and sore throat.   Eyes:  Negative for pain and visual disturbance.  Respiratory:  Negative for cough and shortness of breath.   Cardiovascular:  Negative for chest pain and palpitations.  Gastrointestinal:  Negative for abdominal pain and vomiting.  Genitourinary:  Negative for dysuria and hematuria.  Musculoskeletal:  Positive for back pain. Negative for arthralgias.  Skin:  Negative for color change and rash.  Neurological:  Positive for weakness and numbness. Negative for seizures and syncope.  All other systems reviewed and are negative.  Physical Exam Updated Vital Signs BP (!)  163/103 (BP Location: Left Wrist)   Pulse (!) 106   Temp 98.1 F (36.7 C)   Resp (!) 28   Ht 1.727 m (5\' 8" )   Wt (!) 185.1 kg   LMP 03/01/2021   SpO2 100%   BMI 62.04 kg/m   Physical Exam Vitals and nursing note reviewed.  Constitutional:      General: She is not in acute distress.    Appearance:  She is well-developed. She is obese.  HENT:     Head: Normocephalic and atraumatic.  Eyes:     Conjunctiva/sclera: Conjunctivae normal.  Cardiovascular:     Rate and Rhythm: Normal rate and regular rhythm.     Heart sounds: No murmur heard. Pulmonary:     Effort: Pulmonary effort is normal. No respiratory distress.     Breath sounds: Normal breath sounds.  Abdominal:     Palpations: Abdomen is soft.     Tenderness: There is no abdominal tenderness.  Musculoskeletal:     Cervical back: Neck supple.     Comments: Patient with pain that radiates down to the right leg.  Slight numbness to the foot.  And slight weakness to the toe area.  Good cap refill.  Skin:    General: Skin is warm and dry.  Neurological:     Mental Status: She is alert and oriented to person, place, and time.     Sensory: Sensory deficit present.     Motor: Weakness present.    ED Results / Procedures / Treatments   Labs (all labs ordered are listed, but only abnormal results are displayed) Labs Reviewed - No data to display  EKG None  Radiology No results found.  Procedures Procedures   Medications Ordered in ED Medications  oxyCODONE (Oxy IR/ROXICODONE) immediate release tablet 15 mg (15 mg Oral Given 04/16/21 1630)    ED Course  I have reviewed the triage vital signs and the nursing notes.  Pertinent labs & imaging results that were available during my care of the patient were reviewed by me and considered in my medical decision making (see chart for details).    MDM Rules/Calculators/A&P                          Patient preferred to get her oxycodone here 15 mg.  She says that will be  a big help.  Not able to give her long-term prescription because she is under pain management she understands that.  Patient stable for discharge home.  No acute changes to her sciatica.  This is all been chronic and is being followed by several specialist.  Final Clinical Impression(s) / ED Diagnoses Final diagnoses:  Chronic right-sided low back pain with right-sided sciatica    Rx / DC Orders ED Discharge Orders     None        Fredia Sorrow, MD 04/16/21 1653

## 2021-04-23 ENCOUNTER — Other Ambulatory Visit: Payer: Self-pay | Admitting: Physician Assistant

## 2021-04-23 DIAGNOSIS — Z1231 Encounter for screening mammogram for malignant neoplasm of breast: Secondary | ICD-10-CM

## 2021-05-01 ENCOUNTER — Emergency Department (HOSPITAL_COMMUNITY): Payer: Medicaid Other

## 2021-05-01 ENCOUNTER — Emergency Department (HOSPITAL_COMMUNITY)
Admission: EM | Admit: 2021-05-01 | Discharge: 2021-05-01 | Disposition: A | Discharge status: Home / Self Care | Payer: Medicaid Other | Attending: Emergency Medicine | Admitting: Emergency Medicine

## 2021-05-01 ENCOUNTER — Other Ambulatory Visit: Payer: Self-pay

## 2021-05-01 DIAGNOSIS — I10 Essential (primary) hypertension: Secondary | ICD-10-CM | POA: Diagnosis not present

## 2021-05-01 DIAGNOSIS — Y92009 Unspecified place in unspecified non-institutional (private) residence as the place of occurrence of the external cause: Secondary | ICD-10-CM

## 2021-05-01 DIAGNOSIS — E119 Type 2 diabetes mellitus without complications: Secondary | ICD-10-CM | POA: Diagnosis not present

## 2021-05-01 DIAGNOSIS — M546 Pain in thoracic spine: Secondary | ICD-10-CM | POA: Insufficient documentation

## 2021-05-01 DIAGNOSIS — Z7984 Long term (current) use of oral hypoglycemic drugs: Secondary | ICD-10-CM | POA: Insufficient documentation

## 2021-05-01 DIAGNOSIS — W010XXA Fall on same level from slipping, tripping and stumbling without subsequent striking against object, initial encounter: Secondary | ICD-10-CM | POA: Insufficient documentation

## 2021-05-01 DIAGNOSIS — F0781 Postconcussional syndrome: Secondary | ICD-10-CM | POA: Diagnosis not present

## 2021-05-01 DIAGNOSIS — J45909 Unspecified asthma, uncomplicated: Secondary | ICD-10-CM | POA: Diagnosis not present

## 2021-05-01 DIAGNOSIS — M545 Low back pain, unspecified: Secondary | ICD-10-CM | POA: Insufficient documentation

## 2021-05-01 DIAGNOSIS — M25551 Pain in right hip: Secondary | ICD-10-CM | POA: Insufficient documentation

## 2021-05-01 DIAGNOSIS — T07XXXA Unspecified multiple injuries, initial encounter: Secondary | ICD-10-CM

## 2021-05-01 DIAGNOSIS — G44309 Post-traumatic headache, unspecified, not intractable: Secondary | ICD-10-CM | POA: Insufficient documentation

## 2021-05-01 DIAGNOSIS — Z79899 Other long term (current) drug therapy: Secondary | ICD-10-CM | POA: Insufficient documentation

## 2021-05-01 DIAGNOSIS — S0990XA Unspecified injury of head, initial encounter: Secondary | ICD-10-CM | POA: Diagnosis present

## 2021-05-01 DIAGNOSIS — S0093XA Contusion of unspecified part of head, initial encounter: Secondary | ICD-10-CM | POA: Diagnosis not present

## 2021-05-01 DIAGNOSIS — Z7951 Long term (current) use of inhaled steroids: Secondary | ICD-10-CM | POA: Insufficient documentation

## 2021-05-01 MED ORDER — ACETAMINOPHEN ER 650 MG PO TBCR
650.0000 mg | EXTENDED_RELEASE_TABLET | Freq: Three times a day (TID) | ORAL | 0 refills | Status: DC
Start: 2021-05-01 — End: 2023-09-26

## 2021-05-01 MED ORDER — KETOROLAC TROMETHAMINE 30 MG/ML IJ SOLN
30.0000 mg | Freq: Once | INTRAMUSCULAR | Status: AC
  Administered 2021-05-01: 30 mg via INTRAMUSCULAR
  Filled 2021-05-01: qty 1

## 2021-05-01 MED ORDER — NAPROXEN 500 MG PO TABS: 500.0000 mg | ORAL_TABLET | Freq: Two times a day (BID) | ORAL | 0 refills | Status: DC

## 2021-05-01 MED ORDER — NAPROXEN 500 MG PO TABS
500.0000 mg | ORAL_TABLET | Freq: Once | ORAL | Status: AC
  Administered 2021-05-01: 500 mg via ORAL
  Filled 2021-05-01: qty 1

## 2021-05-01 MED ORDER — LIDOCAINE 5 % EX PTCH: 1.0000 | MEDICATED_PATCH | CUTANEOUS | 0 refills | Status: DC

## 2021-05-01 MED ORDER — ACETAMINOPHEN 325 MG PO TABS
650.0000 mg | ORAL_TABLET | Freq: Once | ORAL | Status: AC
  Administered 2021-05-01: 650 mg via ORAL
  Filled 2021-05-01: qty 2

## 2021-05-01 MED ORDER — MORPHINE SULFATE 30 MG PO TABS: 30.0000 mg | ORAL_TABLET | ORAL | Status: DC | PRN

## 2021-05-01 NOTE — ED Notes (Signed)
In radiology

## 2021-05-01 NOTE — ED Triage Notes (Signed)
Patient arrives via EMS from her home.  She uses a walker to ambulate, reports tripping and falling today.  Patient co having right hip pain

## 2021-05-01 NOTE — ED Notes (Signed)
Patient is off the floor for radiology studies- VS pending, not available

## 2021-05-01 NOTE — ED Provider Notes (Signed)
[facility] DEPT Provider Note   CSN: [id number] Arrival date & time: 05/01/21  1251     History Chief Complaint  Patient presents with   [name] is a 45 y.o. female.  HPI    45 year old female comes in a chief complaint of fall and resultant pain.  Patient had a fall 3 days ago and then again yesterday.  The fall was mechanical because her legs were giving out.  She fell backwards on 1 occasion and started having pain on the right side of her hip and lower back.  On another occasion she fell backwards and might of struck her head.  She is having some headache, seeing spots occasionally.  Patient is able to walk in her house, but it is limited because of her pain.  Pain is primarily in the lower back and right hip.  Past Medical History:  Diagnosis Date   Allergic rhinitis    Anemia    Anxiety disorder    Asthma    Benign essential hypertension    Depression    Diabetes mellitus without complication ([place])    Edema    History of blood clots    Hyperglycemia    Lumbar radiculopathy    Metrorrhagia    Morbid obesity (HCC)    OSA (obstructive sleep apnea)    Sickle cell trait ([place])    Snoring     Patient Active Problem List   Diagnosis Date Noted   Menorrhagia 03/01/2018   Fibroids 03/01/2018   Obesity, morbid ([place]) 12/24/2013   Obesity hypoventilation syndrome ([place]) 12/24/2013   Iron deficiency anemia 09/29/2012   Pulmonary embolism ([place]) 09/26/2012   SOB (shortness of breath) 09/25/2012   Anemia 09/25/2012   PALPITATIONS 03/10/2010   DEPRESSION 12/09/2009   MENORRHAGIA 12/09/2009   ROTATOR CUFF INJURY, LEFT SHOULDER 12/09/2009   PANIC DISORDER 07/17/2009   LUMBAR RADICULOPATHY, LEFT 07/17/2009   SLEEP APNEA, OBSTRUCTIVE 11/05/2008   PERIPHERAL EDEMA 11/05/2008   URINALYSIS, ABNORMAL 11/05/2008   Morbid obesity ([place]) 06/09/2007   HYPERTENSION, BENIGN ESSENTIAL 06/09/2007   ALLERGIC RHINITIS 06/09/2007   ASTHMA  06/09/2007   URTICARIA 06/09/2007   ANXIETY DISORDER, HX OF 06/09/2007   DEPRESSION, HX OF 06/09/2007   HYPERGLYCEMIA 06/02/2007   CYST, VULVA 07/25/2006   ANEMIA, IRON DEFICIENCY 12/28/2005   ECHOCARDIOGRAM, ABNORMAL 01/25/2005   Personal history of venous thrombosis and embolism 01/24/2005    Past Surgical History:  Procedure Laterality Date   CESAREAN SECTION     CHOLECYSTECTOMY     TUBAL LIGATION       OB History     Gravida  3   Para      Term      Preterm      AB      Living  3      SAB      IAB      Ectopic      Multiple      Live Births  2           Family History  Problem Relation Age of Onset   Leukemia Father    Diabetes Father    Asthma Mother    Hypertension Mother    Breast cancer Maternal Aunt    Breast cancer Paternal Aunt    Diabetes Brother     Social History   Tobacco Use   Smoking status: Never   Smokeless tobacco: Never  Vaping Use  Vaping Use: Never used  Substance Use Topics   Alcohol use: Yes   Drug use: No    Home Medications Prior to Admission medications   Medication Sig Start Date End Date Taking? Authorizing Provider  acetaminophen (TYLENOL 8 HOUR) 650 MG CR tablet Take 1 tablet (650 mg total) by mouth every 8 (eight) hours. 05/01/21  Yes Kyrah Schiro, MD  lidocaine (LIDODERM) 5 % Place 1 patch onto the skin daily. Remove & Discard patch within 12 hours or as directed by MD 05/01/21  Yes Varney Biles, MD  naproxen (NAPROSYN) 500 MG tablet Take 1 tablet (500 mg total) by mouth 2 (two) times daily. 05/01/21  Yes Varney Biles, MD  albuterol (PROAIR HFA) 108 (90 Base) MCG/ACT inhaler Use 2 puffs every 4 hours as needed for cough or wheeze.  May use 2 puffs 10-20 minutes prior to exercise. 02/09/16   Charlies Silvers, MD  cetirizine (ZYRTEC) 10 MG tablet Take 10 mg by mouth daily.    [provider]  cyclobenzaprine (FLEXERIL) 10 MG tablet Take 10 mg by mouth 3 (three) times daily.    [provider]  gabapentin (NEURONTIN) 100 MG capsule Take 1 capsule (100 mg total) by mouth 3 (three) times daily. 06/13/20   Nicholas Lose, MD  glipiZIDE (GLUCOTROL XL) 10 MG 24 hr tablet Take 10 mg by mouth daily with breakfast.    [provider]  LINZESS 72 MCG capsule Take 72 mcg by mouth daily as needed. 01/15/21   [provider]  losartan (COZAAR) 100 MG tablet Take 1 tablet (100 mg total) by mouth daily. 02/02/13   Thurnell Lose, MD  metFORMIN (GLUCOPHAGE) 1000 MG tablet Take 1,000 mg by mouth 2 (two) times daily with a meal.    [provider]  metoprolol succinate (TOPROL-XL) 50 MG 24 hr tablet Take 50 mg by mouth daily. 02/16/21   [provider]  mometasone-formoterol (DULERA) 200-5 MCG/ACT AERO Inhale 2 puffs into the lungs 2 (two) times daily. 11/11/15   Charlies Silvers, MD  montelukast (SINGULAIR) 10 MG tablet Take 10 mg by mouth at bedtime.    [provider]  oxyCODONE (ROXICODONE) 15 MG immediate release tablet Take 15 mg by mouth 4 (four) times daily as needed. 03/20/21   [provider]  SUMAtriptan (IMITREX) 100 MG tablet Take 100 mg by mouth as directed. 03/16/21   [provider]  topiramate (TOPAMAX) 50 MG tablet Take 50 mg by mouth at bedtime. 01/19/21   [provider]    Allergies    Doxycycline, Fish allergy, Fish oil, Citalopram, Amoxicillin, Other, Penicillins, and Strawberry extract  Review of Systems   Review of Systems  Constitutional:  Positive for activity change.  Eyes:  Positive for visual disturbance.  Respiratory:  Negative for shortness of breath.   Cardiovascular:  Negative for chest pain.  Musculoskeletal:  Positive for arthralgias.  Skin:  Negative for wound.  Neurological:  Positive for headaches.  All other systems reviewed and are negative.  Physical Exam Updated Vital Signs BP (!) 148/81   Pulse 98   Temp 99.1 F (37.3 C) (Oral)   Resp 18   Ht 5\' 8"  (1.727 m)   Wt  (!) 185.1 kg   SpO2 96%   BMI 62.04 kg/m   Physical Exam Vitals and nursing note reviewed.  Constitutional:      Appearance: She is well-developed.  HENT:     Head: Normocephalic and atraumatic.  Eyes:  Pupils: Pupils are equal, round, and reactive to light.  Neck:     Comments: No midline c-spine tenderness Cardiovascular:     Rate and Rhythm: Normal rate and regular rhythm.     Heart sounds: No murmur heard. Pulmonary:     Effort: Pulmonary effort is normal. No respiratory distress.     Breath sounds: Normal breath sounds.  Chest:     Chest wall: No tenderness.  Abdominal:     General: Bowel sounds are normal. There is no distension.     Palpations: Abdomen is soft.     Tenderness: There is no abdominal tenderness.  Musculoskeletal:     Cervical back: Normal range of motion and neck supple.     Comments: No long bone tenderness - upper and lower extrmeities and no pelvic pain, instability.  Skin:    General: Skin is warm and dry.     Findings: No rash.  Neurological:     Mental Status: She is alert and oriented to person, place, and time.     Cranial Nerves: No cranial nerve deficit.    ED Results / Procedures / Treatments   Labs (all labs ordered are listed, but only abnormal results are displayed) Labs Reviewed - No data to display  EKG None  Radiology DG Thoracic Spine 2 View  Result Date: 05/01/2021 CLINICAL DATA:  Recent fall with upper back pain, initial encounter EXAM: THORACIC SPINE 2 VIEWS COMPARISON:  None. FINDINGS: Mild osteophytic changes are seen. Pedicles are within normal limits and no paraspinal mass lesion is seen. Lateral view is limited by patient body habitus. No compression deformity is seen. IMPRESSION: Limited exam as described. Degenerative changes of the thoracic spine are noted. No acute abnormality is seen. Electronically Signed   By: Inez Catalina M.D.   On: 05/01/2021 16:55   DG Lumbar Spine Complete  Result Date:  05/01/2021 CLINICAL DATA:  Recent fall with low back pain, initial encounter EXAM: LUMBAR SPINE - COMPLETE 4+ VIEW COMPARISON:  06/04/2009 FINDINGS: Five lumbar type vertebral bodies are well visualized. Vertebral body height is well maintained. No pars defects are noted. Degenerative facet changes are seen. No anterolisthesis is noted. Disc space narrowing at L5-S1 is seen. IMPRESSION: Mild degenerative change without acute abnormality. Electronically Signed   By: Inez Catalina M.D.   On: 05/01/2021 16:53   CT Head Wo Contrast  Result Date: 05/01/2021 CLINICAL DATA:  Tripping injury, fall, head injury EXAM: CT HEAD WITHOUT CONTRAST TECHNIQUE: Contiguous axial images were obtained from the base of the skull through the vertex without intravenous contrast. COMPARISON:  None. FINDINGS: Brain: No evidence of acute infarction, hemorrhage, hydrocephalus, extra-axial collection or mass lesion/mass effect. Vascular: No hyperdense vessel or unexpected calcification. Skull: Normal. Negative for fracture or focal lesion. Sinuses/Orbits: No acute finding. Other: None. IMPRESSION: No acute intracranial abnormality by noncontrast CT. Electronically Signed   By: Jerilynn Mages.  Shick M.D.   On: 05/01/2021 16:19   DG Hip Unilat W or Wo Pelvis 2-3 Views Right  Result Date: 05/01/2021 CLINICAL DATA:  Recent fall with right hip pain, initial encounter EXAM: DG HIP (WITH OR WITHOUT PELVIS) 3V RIGHT COMPARISON:  None. FINDINGS: Pelvic ring appears intact. Degenerative changes of the hip joints are noted bilaterally. No acute fracture or dislocation is noted. No soft tissue abnormality is seen. IMPRESSION: Degenerative change without acute abnormality. Electronically Signed   By: Inez Catalina M.D.   On: 05/01/2021 16:54    Procedures Procedures   Medications Ordered in  ED Medications  morphine (MSIR) tablet 30 mg (has no administration in time range)  ketorolac (TORADOL) 30 MG/ML injection 30 mg (has no administration in time range)   acetaminophen (TYLENOL) tablet 650 mg (has no administration in time range)  naproxen (NAPROSYN) tablet 500 mg (500 mg Oral Given 05/01/21 1525)    ED Course  I have reviewed the triage vital signs and the nursing notes.  Pertinent labs & imaging results that were available during my care of the patient were reviewed by me and considered in my medical decision making (see chart for details).    MDM Rules/Calculators/A&P                          45 year old comes in with chief complaint of fall. She had 2 separate falls spanning 2 days prior to ED arrival.  Having some headaches along with vision change.  CT head ordered to make sure there is no intracranial bleed.  Clinical suspicion is high for postconcussive syndrome.  C-spine has been cleared clinically.  Rest of the musculoskeletal survey reveals lower thoracic, diffuse lumbar spine tenderness and also right hip tenderness.  X-rays ordered.  5:21 PM CT scan and x-rays are reassuring.  Results discussed with the patient.  She informs me that she is able to ambulate, just shorter distances because of pain.  She does not want CT scan of the pelvis to look for any occult fracture.  We have informed her that she will have to add nonopioid pain medication to her [place] 15 for further pain control and start Hubbert therapy.  We have advised PCP follow-up in 5 to 7 days with strict ER return precautions.  Final Clinical Impression(s) / ED Diagnoses Final diagnoses:  Multiple contusions  Fall in home, initial encounter  Post concussion syndrome    Rx / DC Orders ED Discharge Orders          Ordered    lidocaine (LIDODERM) 5 %  Every 24 hours        05/01/21 1708    naproxen (NAPROSYN) 500 MG tablet  2 times daily        05/01/21 1708    acetaminophen (TYLENOL 8 HOUR) 650 MG CR tablet  Every 8 hours        05/01/21 [place], Alyssia Heese, MD 05/01/21 1722

## 2021-05-01 NOTE — Discharge Instructions (Addendum)
The x-rays do not reveal any evidence of fracture.  Please see your primary care doctor in 7 to 10 days if your pain is not improving.  Read instructions provided on [place] treatment.

## 2021-05-11 ENCOUNTER — Other Ambulatory Visit: Payer: Self-pay | Admitting: *Deleted

## 2021-05-11 ENCOUNTER — Other Ambulatory Visit: Payer: Medicaid Other

## 2021-05-11 DIAGNOSIS — D509 Iron deficiency anemia, unspecified: Secondary | ICD-10-CM

## 2021-05-12 ENCOUNTER — Inpatient Hospital Stay: Payer: Medicaid Other | Attending: Hematology and Oncology

## 2021-05-12 NOTE — Assessment & Plan Note (Signed)
Most likely cause: Uterine bleeding from fibroids plus malabsorption IV iron treatment: 09/28/2012,January 2020 Longstanding history of iron deficiency(all her life)  Blood work review 09/03/2018: Hemoglobin 9.8, MCV 61, platelet 663 02/26/2019:Iron saturation 12%, ferritin 35improved from 9, hemoglobin 12 improved from 10.3 08/27/2019: Hemoglobin 11, MCV 66, platelets 592, iron saturation 5%, ferritin 28 04/30/2020: Hemoglobin 9.4, MCV 61.9, platelets 624 10/08/2020: Hemoglobin 10.9, MCV 67.2, iron saturation 5%, ferritin 21, platelets 598 02/10/21: Hb 12.8, ferritin 36, Iron Sat 8%, TIBC 363  IV iron treatment: 11/29/2018, November 2020, August 2021, Jan 2022 (1 dose)

## 2021-05-12 NOTE — Progress Notes (Signed)
I called the patient but there was no response.  I left a voicemail.

## 2021-05-13 ENCOUNTER — Inpatient Hospital Stay: Payer: Medicaid Other | Admitting: Hematology and Oncology

## 2021-05-13 ENCOUNTER — Other Ambulatory Visit: Payer: Self-pay

## 2021-05-13 DIAGNOSIS — D500 Iron deficiency anemia secondary to blood loss (chronic): Secondary | ICD-10-CM

## 2021-05-13 NOTE — Progress Notes (Signed)
This encounter was created in error - please disregard. This encounter was created in error - please disregard. I called the patient and there was no answer.  I left a voicemail.

## 2021-05-21 ENCOUNTER — Ambulatory Visit
Admission: RE | Admit: 2021-05-21 | Discharge: 2021-05-21 | Disposition: A | Discharge status: Home / Self Care | Payer: Medicaid Other | Source: Ambulatory Visit | Attending: Physician Assistant | Admitting: Physician Assistant

## 2021-05-21 DIAGNOSIS — M79604 Pain in right leg: Secondary | ICD-10-CM

## 2021-07-22 ENCOUNTER — Encounter: Payer: Self-pay | Admitting: Hematology and Oncology

## 2021-07-22 ENCOUNTER — Other Ambulatory Visit: Payer: Self-pay

## 2021-07-22 ENCOUNTER — Ambulatory Visit
Admission: RE | Admit: 2021-07-22 | Discharge: 2021-07-22 | Disposition: A | Discharge status: Home / Self Care | Payer: Medicaid Other | Source: Ambulatory Visit | Attending: Physician Assistant | Admitting: Physician Assistant

## 2021-07-22 DIAGNOSIS — Z1231 Encounter for screening mammogram for malignant neoplasm of breast: Secondary | ICD-10-CM

## 2021-07-27 ENCOUNTER — Inpatient Hospital Stay: Payer: Medicaid Other | Attending: Hematology and Oncology

## 2021-07-27 ENCOUNTER — Other Ambulatory Visit: Payer: Self-pay

## 2021-07-27 DIAGNOSIS — D509 Iron deficiency anemia, unspecified: Secondary | ICD-10-CM | POA: Insufficient documentation

## 2021-07-27 LAB — CBC WITH DIFFERENTIAL (CANCER CENTER ONLY)
Abs Immature Granulocytes: 0.03 10*3/uL (ref 0.00–0.07)
Basophils Absolute: 0 10*3/uL (ref 0.0–0.1)
Basophils Relative: 0 %
Eosinophils Absolute: 0.1 10*3/uL (ref 0.0–0.5)
Eosinophils Relative: 2 %
HCT: 30.1 % — ABNORMAL LOW (ref 36.0–46.0)
Hemoglobin: 9 g/dL — ABNORMAL LOW (ref 12.0–15.0)
Immature Granulocytes: 0 %
Lymphocytes Relative: 24 %
Lymphs Abs: 2.1 10*3/uL (ref 0.7–4.0)
MCH: 19.1 pg — ABNORMAL LOW (ref 26.0–34.0)
MCHC: 29.9 g/dL — ABNORMAL LOW (ref 30.0–36.0)
MCV: 63.8 fL — ABNORMAL LOW (ref 80.0–100.0)
Monocytes Absolute: 0.4 10*3/uL (ref 0.1–1.0)
Monocytes Relative: 4 %
Neutro Abs: 6.3 10*3/uL (ref 1.7–7.7)
Neutrophils Relative %: 70 %
Platelet Count: 517 10*3/uL — ABNORMAL HIGH (ref 150–400)
RBC: 4.72 MIL/uL (ref 3.87–5.11)
RDW: 17.7 % — ABNORMAL HIGH (ref 11.5–15.5)
WBC Count: 9 10*3/uL (ref 4.0–10.5)
nRBC: 0 % (ref 0.0–0.2)

## 2021-07-27 NOTE — Progress Notes (Addendum)
MYCHART VIRTUAL VISIT: Verified patient's information and proceeded to do the visit.     Patient Care Team: Milford Cage, PA as PCP - General (Physician Assistant) Velvet Bathe, MD (Inactive) as Referring Physician (Family Medicine)  DIAGNOSIS:    ICD-10-CM   1. Iron deficiency anemia, unspecified iron deficiency anemia type  D50.9       CHIEF COMPLIANT: Follow-up of iron deficiency anemia  INTERVAL HISTORY: [name] is a 45 y.o. with above-mentioned history of iron deficiency anemia who previously received IV iron therapy (last 07/02/20). Labs on 07/27/2021 showed: Hgb 9.0 and HCT 30.1. She presents to the clinic today for follow-up.  She reports to me that she has complaints of fatigue as well as craving for ice chips.  She was very worried because she was informed that there is a possibility that she may have a leukemia given the fact that she has elevated white blood cell count as well as elevated platelets.  She has lost some weight by trying to eat less and exercise.  ALLERGIES:  is allergic to doxycycline, fish allergy, fish oil, citalopram, amoxicillin, other, penicillins, and strawberry extract.  MEDICATIONS:  Current Outpatient Medications  Medication Sig Dispense Refill   acetaminophen (TYLENOL 8 HOUR) 650 MG CR tablet Take 1 tablet (650 mg total) by mouth every 8 (eight) hours. 30 tablet 0   albuterol (PROAIR HFA) 108 (90 Base) MCG/ACT inhaler Use 2 puffs every 4 hours as needed for cough or wheeze.  May use 2 puffs 10-20 minutes prior to exercise. 1 Inhaler 0   cetirizine (ZYRTEC) 10 MG tablet Take 10 mg by mouth daily.     cyclobenzaprine (FLEXERIL) 10 MG tablet Take 10 mg by mouth 3 (three) times daily.     gabapentin (NEURONTIN) 100 MG capsule Take 1 capsule (100 mg total) by mouth 3 (three) times daily.     glipiZIDE (GLUCOTROL XL) 10 MG 24 hr tablet Take 10 mg by mouth daily with breakfast.     lidocaine (LIDODERM) 5 % Place 1 patch onto the skin daily.  Remove & Discard patch within 12 hours or as directed by MD 30 patch 0   LINZESS 72 MCG capsule Take 72 mcg by mouth daily as needed.     losartan (COZAAR) 100 MG tablet Take 1 tablet (100 mg total) by mouth daily. 30 tablet 3   metFORMIN (GLUCOPHAGE) 1000 MG tablet Take 1,000 mg by mouth 2 (two) times daily with a meal.     metoprolol succinate (TOPROL-XL) 50 MG 24 hr tablet Take 50 mg by mouth daily.     mometasone-formoterol (DULERA) 200-5 MCG/ACT AERO Inhale 2 puffs into the lungs 2 (two) times daily. 1 Inhaler 0   montelukast (SINGULAIR) 10 MG tablet Take 10 mg by mouth at bedtime.     naproxen (NAPROSYN) 500 MG tablet Take 1 tablet (500 mg total) by mouth 2 (two) times daily. 30 tablet 0   oxyCODONE (ROXICODONE) 15 MG immediate release tablet Take 15 mg by mouth 4 (four) times daily as needed.     SUMAtriptan (IMITREX) 100 MG tablet Take 100 mg by mouth as directed.     topiramate (TOPAMAX) 50 MG tablet Take 50 mg by mouth at bedtime.     No current facility-administered medications for this visit.    PHYSICAL EXAMINATION: ECOG PERFORMANCE STATUS: 1 - Symptomatic but completely ambulatory   LABORATORY DATA:  I have reviewed the data as listed CMP Latest Ref Rng & Units 06/29/2020 04/30/2020  08/27/2017  Glucose 70 - 99 mg/dL 161(H) 373(H) 162(H)  BUN 6 - 20 mg/dL 12 12 10   Creatinine 0.44 - 1.00 mg/dL 0.81 0.89 0.90  Sodium 135 - 145 mmol/L 138 137 135  Potassium 3.5 - 5.1 mmol/L 3.5 4.6 3.9  Chloride 98 - 111 mmol/L 103 100 101  CO2 22 - 32 mmol/L 22 24 23   Calcium 8.9 - 10.3 mg/dL 8.5(L) 8.6(L) 9.1  Total Protein 6.0 - 8.3 g/dL - - -  Total Bilirubin 0.3 - 1.2 mg/dL - - -  Alkaline Phos 39 - 117 U/L - - -  AST 0 - 37 U/L - - -  ALT 0 - 35 U/L - - -    Lab Results  Component Value Date   WBC 9.0 07/27/2021   HGB 9.0 (L) 07/27/2021   HCT 30.1 (L) 07/27/2021   MCV 63.8 (L) 07/27/2021   PLT 517 (H) 07/27/2021   NEUTROABS 6.3 07/27/2021    ASSESSMENT & PLAN:  Iron  deficiency anemia Most likely cause: Uterine bleeding from fibroids plus malabsorption IV iron treatment: 09/28/2012, January 2020, November 2020, August 2021, Jan 2022 (1 dose) Longstanding history of iron deficiency (all her life)   Blood work review 09/03/2018: Hemoglobin 9.8, MCV 61, platelet 663 02/26/2019: Iron saturation 12%, ferritin 35 improved from 9, hemoglobin 12 improved from 10.3 08/27/2019: Hemoglobin 11, MCV 66, platelets 592, iron saturation 5%, ferritin 28 04/30/2020: Hemoglobin 9.4, MCV 61.9, platelets 624 10/08/2020: Hemoglobin 10.9, MCV 67.2, iron saturation 5%, ferritin 21, platelets 598 02/10/21: Hb 12.8, ferritin 36, Iron Sat 8%, TIBC 363 07/27/2021: Hemoglobin 9 CV 63.8, RDW 17.7, platelets 517, iron studies: Ferritin 17, iron saturation 5%  Counseling: I discussed with the patient that she does not have acute leukemia.  Her white blood cell count in the differential are within normal limits.  The elevated platelet count is most likely related to iron deficiency which is quite common and reactive thrombocytosis is fairly common finding in iron deficiency anemia.  Plan: Administer oral iron ferrous sulfate twice daily.  I instructed her to take stool softeners.  She takes Linzess. Recheck labs in 3 months.  If she has intolerance to oral iron or if her labs get worse then we may have to consider giving IV iron.   No orders of the defined types were placed in this encounter.  The patient has a good understanding of the overall plan. she agrees with it. she will call with any problems that may develop before the next visit here.  Total time spent: 20 mins including face to face time and time spent for planning, charting and coordination of care  Rulon Eisenmenger, MD, MPH 07/28/2021  I, Thana Ates, am acting as scribe for Dr. Nicholas Lose.  I have reviewed the above documentation for accuracy and completeness, and I agree with the above.

## 2021-07-28 ENCOUNTER — Telehealth (HOSPITAL_BASED_OUTPATIENT_CLINIC_OR_DEPARTMENT_OTHER): Payer: Medicaid Other | Admitting: Hematology and Oncology

## 2021-07-28 DIAGNOSIS — D509 Iron deficiency anemia, unspecified: Secondary | ICD-10-CM | POA: Diagnosis not present

## 2021-07-28 LAB — FERRITIN: Ferritin: 21 ng/mL (ref 11–307)

## 2021-07-28 LAB — IRON AND TIBC
Iron: 19 ug/dL — ABNORMAL LOW (ref 41–142)
Saturation Ratios: 5 % — ABNORMAL LOW (ref 21–57)
TIBC: 349 ug/dL (ref 236–444)
UIBC: 330 ug/dL (ref 120–384)

## 2021-07-28 MED ORDER — FERROUS GLUCONATE 324 (38 FE) MG PO TABS: 324.0000 mg | ORAL_TABLET | Freq: Two times a day (BID) | ORAL | 3 refills | Status: DC

## 2021-07-28 NOTE — Assessment & Plan Note (Signed)
Most likely cause: Uterine bleeding from fibroids plus malabsorption IV iron treatment: 09/28/2012,January 2020, November 2020, August 2021, Jan 2022 (1 dose) Longstanding history of iron deficiency(all her life)  Blood work review 09/03/2018: Hemoglobin 9.8, MCV 61, platelet 663 02/26/2019:Iron saturation 12%, ferritin 35improved from 9, hemoglobin 12 improved from 10.3 08/27/2019: Hemoglobin 11, MCV 66, platelets 592, iron saturation 5%, ferritin 28 04/30/2020: Hemoglobin 9.4, MCV 61.9, platelets 624 10/08/2020: Hemoglobin 10.9, MCV 67.2, iron saturation 5%, ferritin 21, platelets 598 02/10/21: Hb 12.8, ferritin 36, Iron Sat 8%, TIBC 363 07/27/2021: Hemoglobin 9 CV 63.8, RDW 17.7, platelets 517, iron studies pending

## 2021-11-18 ENCOUNTER — Other Ambulatory Visit (HOSPITAL_COMMUNITY): Payer: Self-pay | Admitting: Physician Assistant

## 2021-11-18 ENCOUNTER — Other Ambulatory Visit: Payer: Self-pay | Admitting: Physician Assistant

## 2021-11-18 DIAGNOSIS — R0602 Shortness of breath: Secondary | ICD-10-CM

## 2021-11-20 ENCOUNTER — Encounter (HOSPITAL_COMMUNITY): Payer: Self-pay

## 2021-11-20 ENCOUNTER — Emergency Department (HOSPITAL_BASED_OUTPATIENT_CLINIC_OR_DEPARTMENT_OTHER): Admit: 2021-11-20 | Discharge: 2021-11-20 | Disposition: A | Discharge status: Home / Self Care | Payer: Medicaid Other

## 2021-11-20 ENCOUNTER — Emergency Department (HOSPITAL_COMMUNITY)
Admission: EM | Admit: 2021-11-20 | Discharge: 2021-11-20 | Disposition: A | Discharge status: Home / Self Care | Payer: Medicaid Other | Attending: Emergency Medicine | Admitting: Emergency Medicine

## 2021-11-20 ENCOUNTER — Emergency Department (HOSPITAL_COMMUNITY): Payer: Medicaid Other

## 2021-11-20 ENCOUNTER — Other Ambulatory Visit: Payer: Self-pay

## 2021-11-20 DIAGNOSIS — M25561 Pain in right knee: Secondary | ICD-10-CM | POA: Diagnosis not present

## 2021-11-20 DIAGNOSIS — M79661 Pain in right lower leg: Secondary | ICD-10-CM | POA: Insufficient documentation

## 2021-11-20 DIAGNOSIS — Z79899 Other long term (current) drug therapy: Secondary | ICD-10-CM | POA: Insufficient documentation

## 2021-11-20 DIAGNOSIS — M7918 Myalgia, other site: Secondary | ICD-10-CM

## 2021-11-20 DIAGNOSIS — Z7984 Long term (current) use of oral hypoglycemic drugs: Secondary | ICD-10-CM | POA: Diagnosis not present

## 2021-11-20 DIAGNOSIS — M79604 Pain in right leg: Secondary | ICD-10-CM

## 2021-11-20 DIAGNOSIS — M7989 Other specified soft tissue disorders: Secondary | ICD-10-CM | POA: Insufficient documentation

## 2021-11-20 DIAGNOSIS — M791 Myalgia, unspecified site: Secondary | ICD-10-CM | POA: Diagnosis not present

## 2021-11-20 NOTE — ED Provider Notes (Signed)
[facility] DEPT Provider Note   CSN: [id number] Arrival date & time: 11/20/21  1314     History  Chief Complaint  Patient presents with   Leg Swelling   Knee Pain   calf pain    [name] is a 46 y.o. female.  46 year old female who presents with right leg discomfort.  Patient uses a cane and uses her right hand when she walks.  States that she has dull achiness in her right calf and right lateral thigh.  Does have a history of DVT in the past was concerned about this.  Denies any pleuritic chest pain.  She has not been short of breath.  Pain is worse with movement and better with remaining still.  No treatment use prior arrival      Home Medications Prior to Admission medications   Medication Sig Start Date End Date Taking? Authorizing Provider  acetaminophen (TYLENOL 8 HOUR) 650 MG CR tablet Take 1 tablet (650 mg total) by mouth every 8 (eight) hours. 05/01/21   Varney Biles, MD  albuterol (PROAIR HFA) 108 (90 Base) MCG/ACT inhaler Use 2 puffs every 4 hours as needed for cough or wheeze.  May use 2 puffs 10-20 minutes prior to exercise. 02/09/16   Charlies Silvers, MD  cetirizine (ZYRTEC) 10 MG tablet Take 10 mg by mouth daily.    [provider]  cyclobenzaprine (FLEXERIL) 10 MG tablet Take 10 mg by mouth 3 (three) times daily.    [provider]  ferrous gluconate (FERGON) 324 MG tablet Take 1 tablet (324 mg total) by mouth 2 (two) times daily with a meal. 07/28/21   Nicholas Lose, MD  gabapentin (NEURONTIN) 100 MG capsule Take 1 capsule (100 mg total) by mouth 3 (three) times daily. 06/13/20   Nicholas Lose, MD  glipiZIDE (GLUCOTROL XL) 10 MG 24 hr tablet Take 10 mg by mouth daily with breakfast.    [provider]  lidocaine (LIDODERM) 5 % Place 1 patch onto the skin daily. Remove & Discard patch within 12 hours or as directed by MD 05/01/21   Varney Biles, MD  LINZESS 72 MCG capsule Take 72 mcg by mouth daily as  needed. 01/15/21   [provider]  losartan (COZAAR) 100 MG tablet Take 1 tablet (100 mg total) by mouth daily. 02/02/13   Thurnell Lose, MD  metFORMIN (GLUCOPHAGE) 1000 MG tablet Take 1,000 mg by mouth 2 (two) times daily with a meal.    [provider]  metoprolol succinate (TOPROL-XL) 50 MG 24 hr tablet Take 50 mg by mouth daily. 02/16/21   [provider]  mometasone-formoterol (DULERA) 200-5 MCG/ACT AERO Inhale 2 puffs into the lungs 2 (two) times daily. 11/11/15   Charlies Silvers, MD  montelukast (SINGULAIR) 10 MG tablet Take 10 mg by mouth at bedtime.    [provider]  naproxen (NAPROSYN) 500 MG tablet Take 1 tablet (500 mg total) by mouth 2 (two) times daily. 05/01/21   Varney Biles, MD  oxyCODONE (ROXICODONE) 15 MG immediate release tablet Take 15 mg by mouth 4 (four) times daily as needed. 03/20/21   [provider]  SUMAtriptan (IMITREX) 100 MG tablet Take 100 mg by mouth as directed. 03/16/21   [provider]  topiramate (TOPAMAX) 50 MG tablet Take 50 mg by mouth at bedtime. 01/19/21   [provider]      Allergies    Doxycycline, Fish allergy, Fish oil, Citalopram, Amoxicillin, Other, Penicillins,  and Strawberry extract    Review of Systems   Review of Systems  All other systems reviewed and are negative.  Physical Exam Updated Vital Signs BP (!) 147/84    Pulse 100    Temp 98.9 F (37.2 C) (Oral)    Resp 18    Ht 1.753 m (5\' 9" )    Wt (!) 178.7 kg    LMP 10/20/2021    SpO2 100%    BMI 58.18 kg/m  Physical Exam Vitals and nursing note reviewed.  Constitutional:      General: She is not in acute distress.    Appearance: Normal appearance. She is well-developed. She is not toxic-appearing.  HENT:     Head: Normocephalic and atraumatic.  Eyes:     General: Lids are normal.     Conjunctiva/sclera: Conjunctivae normal.     Pupils: Pupils are equal, round, and reactive to light.  Neck:     Thyroid: No thyroid  mass.     Trachea: No tracheal deviation.  Cardiovascular:     Rate and Rhythm: Normal rate and regular rhythm.     Heart sounds: Normal heart sounds. No murmur heard.   No gallop.  Pulmonary:     Effort: Pulmonary effort is normal. No respiratory distress.     Breath sounds: Normal breath sounds. No stridor. No decreased breath sounds, wheezing, rhonchi or rales.  Abdominal:     General: There is no distension.     Palpations: Abdomen is soft.     Tenderness: There is no abdominal tenderness. There is no rebound.  Musculoskeletal:        General: No tenderness. Normal range of motion.     Cervical back: Normal range of motion and neck supple.       Legs:  Skin:    General: Skin is warm and dry.     Findings: No abrasion or rash.  Neurological:     Mental Status: She is alert and oriented to person, place, and time. Mental status is at baseline.     GCS: GCS eye subscore is 4. GCS verbal subscore is 5. GCS motor subscore is 6.     Cranial Nerves: No cranial nerve deficit.     Sensory: No sensory deficit.     Motor: Motor function is intact.  Psychiatric:        Attention and Perception: Attention normal.        Speech: Speech normal.        Behavior: Behavior normal.    ED Results / Procedures / Treatments   Labs (all labs ordered are listed, but only abnormal results are displayed) Labs Reviewed  D-DIMER, QUANTITATIVE  CBC WITH DIFFERENTIAL/PLATELET  BASIC METABOLIC PANEL    EKG None  Radiology DG Chest 2 View  Result Date: 11/20/2021 CLINICAL DATA:  Bilateral leg swelling. Right knee calf pain. History of asthma. Hypertension. Shortness of breath. EXAM: CHEST - 2 VIEW COMPARISON:  04/30/2020 FINDINGS: Thoracic spondylosis. Substantial bilateral degenerative glenohumeral arthropathy. Possible free osteochondral fragments in the glenohumeral joints in the subscapular recess regions. The lungs appear clear. Cardiac and mediastinal contours normal. No pleural effusion  identified. IMPRESSION: 1. No acute findings. 2. Advanced for age glenohumeral arthropathy. 3. Thoracic spondylosis. Electronically Signed   By: Van Clines M.D.   On: 11/20/2021 14:38   VAS [place] LOWER EXTREMITY VENOUS (DVT) (7a-7p)  Result Date: 11/20/2021  Lower Venous DVT Study Patient Name:  KAYBREE WILLIAMS Resende  Date of Exam:  11/20/2021 Medical Rec #: [id number]     Accession #:    [id number] Date of Birth: Jan 22, 1976     Patient Gender: F Patient Age:   32 years Exam Location:  [facility] Procedure:      VAS [place] LOWER EXTREMITY VENOUS (DVT) Referring Phys: Judson Roch SMOOT --------------------------------------------------------------------------------  Indications: Pain.  Risk Factors: None identified. Limitations: Body habitus and poor ultrasound/tissue interface. Comparison Study: No prior studies. Performing Technologist: Oliver Hum RVT  Examination Guidelines: A complete evaluation includes B-mode imaging, spectral Doppler, color Doppler, and power Doppler as needed of all accessible portions of each vessel. Bilateral testing is considered an integral part of a complete examination. Limited examinations for reoccurring indications may be performed as noted. The reflux portion of the exam is performed with the patient in reverse Trendelenburg.  +---------+---------------+---------+-----------+----------+-------------------+  RIGHT     Compressibility Phasicity Spontaneity Properties Thrombus Aging       +---------+---------------+---------+-----------+----------+-------------------+  CFV       Full            Yes       Yes                                         +---------+---------------+---------+-----------+----------+-------------------+  SFJ       Full                                                                  +---------+---------------+---------+-----------+----------+-------------------+  FV Prox   Full                                                                   +---------+---------------+---------+-----------+----------+-------------------+  FV Mid    Full                                                                  +---------+---------------+---------+-----------+----------+-------------------+  FV Distal Full                                                                  +---------+---------------+---------+-----------+----------+-------------------+  PFV       Full                                                                  +---------+---------------+---------+-----------+----------+-------------------+  POP  Full            Yes       Yes                                         +---------+---------------+---------+-----------+----------+-------------------+  PTV       Full                                                                  +---------+---------------+---------+-----------+----------+-------------------+  PERO                                                       Not well visualized  +---------+---------------+---------+-----------+----------+-------------------+   +----+---------------+---------+-----------+----------+--------------+  LEFT Compressibility Phasicity Spontaneity Properties Thrombus Aging  +----+---------------+---------+-----------+----------+--------------+  CFV  Full            Yes       Yes                                    +----+---------------+---------+-----------+----------+--------------+    Summary: RIGHT: - There is no evidence of deep vein thrombosis in the lower extremity. However, portions of this examination were limited- see technologist comments above.  - No cystic structure found in the popliteal fossa.  LEFT: - No evidence of common femoral vein obstruction.  *See table(s) above for measurements and observations. Electronically signed by Deitra Mayo MD on 11/20/2021 at 3:26:38 PM.    Final     Procedures Procedures    Medications Ordered in ED Medications - No data to display  ED Course/  Medical Decision Making/ A&P                           Medical Decision Making  Patient's lower extremity Dopplers were negative for blood clots.  She has no concerning findings for pulmonary embolus.  Suspect musculoskeletal pain due to patient's gait with her cane.  Will discharge home        Final Clinical Impression(s) / ED Diagnoses Final diagnoses:  None    Rx / DC Orders ED Discharge Orders     None         Lacretia Leigh, MD 11/20/21 [id number]

## 2021-11-20 NOTE — Progress Notes (Signed)
Right lower extremity venous duplex has been completed. Preliminary results can be found in CV Proc through chart review.  Results were given to Lavonna Rua PA.  11/20/21 2:25 PM [name] RVT

## 2021-11-20 NOTE — ED Provider Triage Note (Signed)
Emergency Medicine Provider Triage Evaluation Note  [name] , a 46 y.o. female  was evaluated in triage.  Pt complains of right lower leg pain. She states that same has been ongoing for the past month but specifically worse since Wednesday. 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 She states that her last blood clot was in 2013. She also endorses some worsening shortness of breath with exertion for the past week.  Denies chest pain  Review of Systems  Positive:  Negative: See above  Physical Exam  BP (!) 175/102 (BP Location: Right Wrist)    Pulse (!) 122    Temp 98.9 F (37.2 C) (Oral)    Resp 18    Ht 5\' 9"  (1.753 m)    Wt (!) 178.7 kg    LMP 10/20/2021    SpO2 99%    BMI 58.18 kg/m  Gen:   Awake, no distress   Resp:  Normal effort  MSK:   Moves extremities without difficulty  Other:  Pain to palpation of posterior right calf and popliteal fossa. DP and PT pulses intact and 2+, no palpable cord  Medical Decision Making  Medically screening exam initiated at 1:41 PM.  Appropriate orders placed.  Chelcie Estorga Helin was informed that the remainder of the evaluation will be completed by another provider, this initial triage assessment does not replace that evaluation, and the importance of remaining in the ED until their evaluation is complete.     Bud Face, PA-C 11/20/21 1347

## 2021-11-20 NOTE — ED Triage Notes (Signed)
Patient c/o bilateral leg swelling, right knee and calf pain. Patient has a history of blood clots.

## 2021-11-20 NOTE — ED Notes (Signed)
Unable to obtain labs at this time

## 2021-12-01 ENCOUNTER — Ambulatory Visit (HOSPITAL_COMMUNITY): Admission: RE | Admit: 2021-12-01 | Payer: Medicaid Other | Source: Ambulatory Visit

## 2021-12-01 ENCOUNTER — Encounter (HOSPITAL_COMMUNITY): Payer: Self-pay

## 2021-12-08 ENCOUNTER — Telehealth: Payer: Self-pay | Admitting: *Deleted

## 2021-12-08 NOTE — Telephone Encounter (Signed)
Received call from pt with complaint of SHOB x several weeks and positive D-Dimer.  Pt states her PCP has ordered CT angio and requesting advice from MD for his opinion on the situation.  Pre MD pt needing to follow care with PCP and proceed with CT angio to r/o PE.  Pt verbalized understanding and states she will continue to work with PCP.

## 2021-12-09 ENCOUNTER — Telehealth: Payer: Self-pay | Admitting: Hematology and Oncology

## 2021-12-09 NOTE — Telephone Encounter (Signed)
Sch per 2/7 inbasket pt aware

## 2021-12-16 ENCOUNTER — Other Ambulatory Visit: Payer: Self-pay

## 2021-12-16 DIAGNOSIS — D509 Iron deficiency anemia, unspecified: Secondary | ICD-10-CM

## 2021-12-17 ENCOUNTER — Other Ambulatory Visit: Payer: Self-pay

## 2021-12-17 ENCOUNTER — Inpatient Hospital Stay: Payer: Medicaid Other | Attending: Hematology and Oncology

## 2021-12-17 DIAGNOSIS — D509 Iron deficiency anemia, unspecified: Secondary | ICD-10-CM | POA: Diagnosis not present

## 2021-12-17 LAB — CBC WITH DIFFERENTIAL (CANCER CENTER ONLY)
Abs Immature Granulocytes: 0.01 10*3/uL (ref 0.00–0.07)
Basophils Absolute: 0 10*3/uL (ref 0.0–0.1)
Basophils Relative: 0 %
Eosinophils Absolute: 0.1 10*3/uL (ref 0.0–0.5)
Eosinophils Relative: 1 %
HCT: 31.3 % — ABNORMAL LOW (ref 36.0–46.0)
Hemoglobin: 9.4 g/dL — ABNORMAL LOW (ref 12.0–15.0)
Immature Granulocytes: 0 %
Lymphocytes Relative: 34 %
Lymphs Abs: 2.6 10*3/uL (ref 0.7–4.0)
MCH: 17.5 pg — ABNORMAL LOW (ref 26.0–34.0)
MCHC: 30 g/dL (ref 30.0–36.0)
MCV: 58.4 fL — ABNORMAL LOW (ref 80.0–100.0)
Monocytes Absolute: 0.4 10*3/uL (ref 0.1–1.0)
Monocytes Relative: 5 %
Neutro Abs: 4.6 10*3/uL (ref 1.7–7.7)
Neutrophils Relative %: 60 %
Platelet Count: 612 10*3/uL — ABNORMAL HIGH (ref 150–400)
RBC: 5.36 MIL/uL — ABNORMAL HIGH (ref 3.87–5.11)
RDW: 20.8 % — ABNORMAL HIGH (ref 11.5–15.5)
WBC Count: 7.7 10*3/uL (ref 4.0–10.5)
nRBC: 0 % (ref 0.0–0.2)

## 2021-12-17 LAB — CMP (CANCER CENTER ONLY)
ALT: 16 U/L (ref 0–44)
AST: 18 U/L (ref 15–41)
Albumin: 3.4 g/dL — ABNORMAL LOW (ref 3.5–5.0)
Alkaline Phosphatase: 96 U/L (ref 38–126)
Anion gap: 8 (ref 5–15)
BUN: 14 mg/dL (ref 6–20)
CO2: 20 mmol/L — ABNORMAL LOW (ref 22–32)
Calcium: 8.8 mg/dL — ABNORMAL LOW (ref 8.9–10.3)
Chloride: 109 mmol/L (ref 98–111)
Creatinine: 1.06 mg/dL — ABNORMAL HIGH (ref 0.44–1.00)
GFR, Estimated: >60 mL/min (ref >60)
Glucose, Bld: 128 mg/dL — ABNORMAL HIGH (ref 70–99)
Potassium: 3.8 mmol/L (ref 3.5–5.1)
Sodium: 137 mmol/L (ref 135–145)
Total Bilirubin: 0.4 mg/dL (ref 0.3–1.2)
Total Protein: 7.8 g/dL (ref 6.5–8.1)

## 2021-12-18 LAB — FERRITIN: Ferritin: 9 ng/mL — ABNORMAL LOW (ref 11–307)

## 2021-12-18 LAB — IRON AND IRON BINDING CAPACITY (CC-WL,HP ONLY)
Iron: 20 ug/dL — ABNORMAL LOW (ref 28–170)
Saturation Ratios: 5 % — ABNORMAL LOW (ref 10.4–31.8)
TIBC: 419 ug/dL (ref 250–450)
UIBC: 399 ug/dL (ref 148–442)

## 2021-12-21 NOTE — Progress Notes (Signed)
°  HEMATOLOGY-ONCOLOGY TELEPHONE VISIT PROGRESS NOTE  I connected with [name] on 12/22/2021 at 10:45 AM EST by telephone and verified that I am speaking with the correct person using two identifiers.  I discussed the limitations, risks, security and privacy concerns of performing an evaluation and management service by telephone and the availability of in person appointments.  I also discussed with the patient that there may be a patient responsible charge related to this service. The patient expressed understanding and agreed to proceed.   History of Present Illness: [name] is a 46 y.o. female with above-mentioned history of iron deficiency anemia who previously received IV iron therapy (last 07/02/20). Labs on 12/17/2021 showed: Hg 9.4, HCT 31.3, iron saturation 5%, ferritin 9. She presents via telephone today for follow-up. Patient reports that she has been experiencing shortness of breath with exertion as well as intermittent chest pains.  Her primary care team has ordered a CT angiogram and she is waiting for that to be scheduled.  She feels cravings for ice chips as well as fatigue and weakness.   Observations/Objective:     Assessment Plan:  ANEMIA, IRON DEFICIENCY Most likely cause: Uterine bleeding from fibroids plus malabsorption IV iron treatment: 09/28/2012, January 2020, November 2020, August 2021, Jan 2022 (1 dose) Longstanding history of iron deficiency (all her life)   Blood work review 09/03/2018: Hemoglobin 9.8, MCV 61, platelet 663 02/26/2019: Iron saturation 12%, ferritin 35 improved from 9, hemoglobin 12 improved from 10.3 08/27/2019: Hemoglobin 11, MCV 66, platelets 592, iron saturation 5%, ferritin 28 04/30/2020: Hemoglobin 9.4, MCV 61.9, platelets 624 10/08/2020: Hemoglobin 10.9, MCV 67.2, iron saturation 5%, ferritin 21, platelets 598 02/10/21: Hb 12.8, ferritin 36, Iron Sat 8%, TIBC 363 07/27/2021: Hemoglobin 9 CV 63.8, RDW 17.7, platelets 517, iron studies:  Ferritin 17, iron saturation 5% 12/22/2021: Hemoglobin 9.4, MCV 58.4, platelets 612, iron saturation 5%, ferritin 9  Current treatment: couldn't tolerate oral iron  She continues to have heavy menstrual cycles. I recommended intravenous iron therapy We will plan on giving Venofer 2 doses of 400 mg each.  Recheck labs and follow-up in 4 months   I discussed the assessment and treatment plan with the patient. The patient was provided an opportunity to ask questions and all were answered. The patient agreed with the plan and demonstrated an understanding of the instructions. The patient was advised to call back or seek an in-person evaluation if the symptoms worsen or if the condition fails to improve as anticipated.   Total time spent: 30 mins including non-face to face time and time spent for planning, charting and coordination of care  Rulon Eisenmenger, MD 12/22/2021    I, Thana Ates, am acting as scribe for Nicholas Lose, MD.  I have reviewed the above documentation for accuracy and completeness, and I agree with the above.

## 2021-12-22 ENCOUNTER — Inpatient Hospital Stay (HOSPITAL_BASED_OUTPATIENT_CLINIC_OR_DEPARTMENT_OTHER): Payer: Medicaid Other | Admitting: Hematology and Oncology

## 2021-12-22 DIAGNOSIS — D500 Iron deficiency anemia secondary to blood loss (chronic): Secondary | ICD-10-CM | POA: Diagnosis not present

## 2021-12-22 NOTE — Assessment & Plan Note (Signed)
Most likely cause: Uterine bleeding from fibroids plus malabsorption IV iron treatment: 09/28/2012,January 2020, November 2020, August 2021, Jan 2022 (1 dose) Longstanding history of iron deficiency(all her life)  Blood work review 09/03/2018: Hemoglobin 9.8, MCV 61, platelet 663 02/26/2019:Iron saturation 12%, ferritin 35improved from 9, hemoglobin 12 improved from 10.3 08/27/2019: Hemoglobin 11, MCV 66, platelets 592, iron saturation 5%, ferritin 28 04/30/2020: Hemoglobin 9.4, MCV 61.9, platelets 624 10/08/2020: Hemoglobin 10.9, MCV 67.2, iron saturation 5%, ferritin 21, platelets 598 02/10/21: Hb 12.8, ferritin 36, Iron Sat 8%, TIBC 363 07/27/2021: Hemoglobin 9 CV 63.8, RDW 17.7, platelets 517, iron studies: Ferritin 17, iron saturation 5% 12/22/2021: Hemoglobin 9.4, MCV 58.4, platelets 612, iron saturation 5%, ferritin 9  Current treatment: Oral iron twice a day with stool softeners I recommended intravenous iron therapy  Recheck labs and follow-up in 4 months

## 2022-01-01 ENCOUNTER — Inpatient Hospital Stay: Payer: Medicaid Other

## 2022-01-01 ENCOUNTER — Telehealth: Payer: Self-pay | Admitting: Hematology and Oncology

## 2022-01-01 NOTE — Telephone Encounter (Signed)
.  Called patient to schedule appointment per 3/3 inbasket, patient is aware of date and time.  ? ?

## 2022-01-07 ENCOUNTER — Ambulatory Visit (HOSPITAL_COMMUNITY): Payer: Medicaid Other

## 2022-01-07 ENCOUNTER — Telehealth: Payer: Self-pay | Admitting: Hematology and Oncology

## 2022-01-07 ENCOUNTER — Inpatient Hospital Stay: Payer: Medicaid Other

## 2022-01-07 NOTE — Telephone Encounter (Signed)
.  Called pt per 3/9 inbasket , Patient was unavailable, a message with appt time and date was left with number on file.   ?

## 2022-01-08 MED FILL — Iron Sucrose Inj 20 MG/ML (Fe Equiv): INTRAVENOUS | Qty: 20 | Status: AC

## 2022-01-09 ENCOUNTER — Inpatient Hospital Stay: Payer: Medicaid Other | Attending: Hematology and Oncology

## 2022-01-14 ENCOUNTER — Ambulatory Visit (HOSPITAL_COMMUNITY): Payer: Medicaid Other

## 2022-01-15 ENCOUNTER — Telehealth: Payer: Self-pay | Admitting: Hematology and Oncology

## 2022-01-15 ENCOUNTER — Inpatient Hospital Stay: Payer: Medicaid Other

## 2022-01-15 NOTE — Telephone Encounter (Signed)
.  Called pt per 3/17 inbasket , Patient was unavailable, a message with appt time and date was left with number on file.   ?

## 2022-02-05 ENCOUNTER — Inpatient Hospital Stay: Payer: Medicaid Other

## 2022-02-05 ENCOUNTER — Telehealth: Payer: Self-pay | Admitting: Hematology and Oncology

## 2022-02-05 NOTE — Telephone Encounter (Signed)
.  Called pt per 4/7 inbasket , Patient was unavailable, a message with appt time and date was left with number on file.   ?

## 2022-02-12 ENCOUNTER — Inpatient Hospital Stay (HOSPITAL_BASED_OUTPATIENT_CLINIC_OR_DEPARTMENT_OTHER): Payer: Medicaid Other | Admitting: Physician Assistant

## 2022-02-12 ENCOUNTER — Other Ambulatory Visit: Payer: Self-pay

## 2022-02-12 ENCOUNTER — Inpatient Hospital Stay: Payer: Medicaid Other | Attending: Hematology and Oncology

## 2022-02-12 VITALS — BP 118/71 | HR 97 | Temp 97.7°F | Resp 18

## 2022-02-12 DIAGNOSIS — L299 Pruritus, unspecified: Secondary | ICD-10-CM | POA: Diagnosis not present

## 2022-02-12 DIAGNOSIS — D573 Sickle-cell trait: Secondary | ICD-10-CM | POA: Insufficient documentation

## 2022-02-12 DIAGNOSIS — I10 Essential (primary) hypertension: Secondary | ICD-10-CM | POA: Diagnosis not present

## 2022-02-12 DIAGNOSIS — G4733 Obstructive sleep apnea (adult) (pediatric): Secondary | ICD-10-CM | POA: Insufficient documentation

## 2022-02-12 DIAGNOSIS — Z803 Family history of malignant neoplasm of breast: Secondary | ICD-10-CM | POA: Diagnosis not present

## 2022-02-12 DIAGNOSIS — D509 Iron deficiency anemia, unspecified: Secondary | ICD-10-CM | POA: Diagnosis present

## 2022-02-12 DIAGNOSIS — N921 Excessive and frequent menstruation with irregular cycle: Secondary | ICD-10-CM | POA: Diagnosis not present

## 2022-02-12 DIAGNOSIS — Z7984 Long term (current) use of oral hypoglycemic drugs: Secondary | ICD-10-CM | POA: Diagnosis not present

## 2022-02-12 DIAGNOSIS — E119 Type 2 diabetes mellitus without complications: Secondary | ICD-10-CM | POA: Insufficient documentation

## 2022-02-12 DIAGNOSIS — D500 Iron deficiency anemia secondary to blood loss (chronic): Secondary | ICD-10-CM

## 2022-02-12 DIAGNOSIS — Z79899 Other long term (current) drug therapy: Secondary | ICD-10-CM | POA: Diagnosis not present

## 2022-02-12 MED ORDER — LORATADINE 10 MG PO TABS: 10.0000 mg | ORAL_TABLET | Freq: Once | ORAL | Status: DC

## 2022-02-12 MED ORDER — SODIUM CHLORIDE 0.9 % IV SOLN
400.0000 mg | Freq: Once | INTRAVENOUS | Status: AC
  Administered 2022-02-12: 400 mg via INTRAVENOUS
  Filled 2022-02-12: qty 20

## 2022-02-12 MED ORDER — SODIUM CHLORIDE 0.9 % IV SOLN: Freq: Once | INTRAVENOUS | Status: AC

## 2022-02-12 MED ORDER — DIPHENHYDRAMINE HCL 25 MG PO CAPS
50.0000 mg | ORAL_CAPSULE | Freq: Once | ORAL | Status: AC
  Administered 2022-02-12: 50 mg via ORAL

## 2022-02-12 NOTE — Patient Instructions (Signed)

## 2022-02-12 NOTE — Progress Notes (Signed)
? ? ? ?Symptom Management Consult note ?[place]   ? ?Patient Care Team: ?Milford Cage, PA as PCP - General (Physician Assistant) ?Velvet Bathe, MD (Inactive) as Referring Physician (Family Medicine)  ? ? ?Name of the patient: [name]  [id number]  February 23, 1976  ? ?Date of visit: 02/12/2022  ? ? ?Chief complaint/ Reason for visit- itching ? ?Oncology History  ? No history exists.  ? ? ?Current Therapy: venofer infusions ? ?Interval history- [name] is a 46 yo female with past medical history significant for iron deficiency anemia seen in the infusion center today with chief complaint of itching.  Onset was 20 minutes ago.  Patient states she first noticed the itching on her breasts and then her upper thighs.  She states the itching has progressively worsened.  She is here receiving IV Venofer.  She tells me she has had iron infusions in the past without any adverse reactions.  Patient has not had one in several years.  She took her usual home medications today, did not have any premedications before receiving treatment today.  Patient admits to multiple seasonal and environmental allergies as well as some medication allergies.  She does not think she has had any known triggers today however states she is easily bothered by things such as being around a person that has a dog or dust.  She states her itching feels like her typical allergic reactions.  She states the itching right now is very mild.  She denies any shortness of breath, wheezing, chest pain, abdominal pain, nausea, emesis, rash.  Denies any new medications, lotions, soaps or laundry detergents. ? ? ? ? ?ROS  ?All other systems are reviewed and are negative for acute change except as noted in the HPI. ? ? ? ?Allergies  ?Allergen Reactions  ? Doxycycline Hives  ? Fish Allergy Hives and Shortness Of Breath  ?  Gets dizzy  ? Fish Oil Anaphylaxis  ? Other Hives, Anaphylaxis and Itching  ?  Allergic to nuts. Causes mouth to itch,  breaks out in hives, trouble breathing  ? Citalopram Itching and Hives  ?  unknown ?Made her pass out  ? Amoxicillin   ?  REACTION: hives ?Has patient had a PCN reaction causing immediate rash, facial/tongue/throat swelling, SOB or lightheadedness with hypotension: Yes ?Has patient had a PCN reaction causing severe rash involving mucus membranes or skin necrosis: No ?Has patient had a PCN reaction that required hospitalization No ?Has patient had a PCN reaction occurring within the last 10 years: No ?If all of the above answers are "NO", then may proceed with Cephalosporin use. ?  ? Penicillins Hives  ? Strawberry Extract   ?  Makes mouth itch  ? ? ? ?Past Medical History:  ?Diagnosis Date  ? Allergic rhinitis   ? Anemia   ? Anxiety disorder   ? Asthma   ? Benign essential hypertension   ? Depression   ? Diabetes mellitus without complication ([place])   ? Edema   ? History of blood clots   ? Hyperglycemia   ? Lumbar radiculopathy   ? Metrorrhagia   ? Morbid obesity ([place])   ? OSA (obstructive sleep apnea)   ? Sickle cell trait ([place])   ? Snoring   ? ? ? ?Past Surgical History:  ?Procedure Laterality Date  ? CESAREAN SECTION    ? CHOLECYSTECTOMY    ? TUBAL LIGATION    ? ? ?Social History  ? ?Socioeconomic History  ?  Marital status: Single  ?  Spouse name: Not on file  ? Number of children: 3  ? Years of education: 34  ? Highest education level: Not on file  ?Occupational History  ? Not on file  ?Tobacco Use  ? Smoking status: Never  ? Smokeless tobacco: Never  ?Vaping Use  ? Vaping Use: Never used  ?Substance and Sexual Activity  ? Alcohol use: Not Currently  ? Drug use: No  ? Sexual activity: Yes  ?  Partners: Male  ?  Birth control/protection: Surgical  ?Other Topics Concern  ? Not on file  ?Social History Narrative  ? Patient is single and lives with her children.  ? Patient has three children.  ? Patient is currently unemployed.  ? Patient is a Equities trader in college now.  ? Patient is right-handed.  ? Patient drinks one  cup of tea every other day.  ? ?Social Determinants of Health  ? ?Financial Resource Strain: Not on file  ?Food Insecurity: Not on file  ?Transportation Needs: Not on file  ?Physical Activity: Not on file  ?Stress: Not on file  ?Social Connections: Not on file  ?Intimate Partner Violence: Not on file  ? ? ?Family History  ?Problem Relation Age of Onset  ? Asthma Mother   ? Hypertension Mother   ? Leukemia Father   ? Diabetes Father   ? Breast cancer Maternal Aunt   ? Breast cancer Paternal Aunt   ? Breast cancer Cousin   ? Diabetes Brother   ? ? ? ?Current Outpatient Medications:  ?  acetaminophen (TYLENOL 8 HOUR) 650 MG CR tablet, Take 1 tablet (650 mg total) by mouth every 8 (eight) hours., Disp: 30 tablet, Rfl: 0 ?  albuterol (PROAIR HFA) 108 (90 Base) MCG/ACT inhaler, Use 2 puffs every 4 hours as needed for cough or wheeze.  May use 2 puffs 10-20 minutes prior to exercise., Disp: 1 Inhaler, Rfl: 0 ?  cetirizine (ZYRTEC) 10 MG tablet, Take 10 mg by mouth daily., Disp: , Rfl:  ?  cyclobenzaprine (FLEXERIL) 10 MG tablet, Take 10 mg by mouth 3 (three) times daily., Disp: , Rfl:  ?  ferrous gluconate (FERGON) 324 MG tablet, Take 1 tablet (324 mg total) by mouth 2 (two) times daily with a meal., Disp: 60 tablet, Rfl: 3 ?  gabapentin (NEURONTIN) 100 MG capsule, Take 1 capsule (100 mg total) by mouth 3 (three) times daily., Disp: , Rfl:  ?  glipiZIDE (GLUCOTROL XL) 10 MG 24 hr tablet, Take 10 mg by mouth daily with breakfast., Disp: , Rfl:  ?  lidocaine (LIDODERM) 5 %, Place 1 patch onto the skin daily. Remove & Discard patch within 12 hours or as directed by MD, Disp: 30 patch, Rfl: 0 ?  LINZESS 72 MCG capsule, Take 72 mcg by mouth daily as needed., Disp: , Rfl:  ?  losartan (COZAAR) 100 MG tablet, Take 1 tablet (100 mg total) by mouth daily., Disp: 30 tablet, Rfl: 3 ?  metFORMIN (GLUCOPHAGE) 1000 MG tablet, Take 1,000 mg by mouth 2 (two) times daily with a meal., Disp: , Rfl:  ?  metoprolol succinate (TOPROL-XL) 50 MG  24 hr tablet, Take 50 mg by mouth daily., Disp: , Rfl:  ?  mometasone-formoterol (DULERA) 200-5 MCG/ACT AERO, Inhale 2 puffs into the lungs 2 (two) times daily., Disp: 1 Inhaler, Rfl: 0 ?  montelukast (SINGULAIR) 10 MG tablet, Take 10 mg by mouth at bedtime., Disp: , Rfl:  ?  naproxen (NAPROSYN) 500  MG tablet, Take 1 tablet (500 mg total) by mouth 2 (two) times daily., Disp: 30 tablet, Rfl: 0 ?  oxyCODONE (ROXICODONE) 15 MG immediate release tablet, Take 15 mg by mouth 4 (four) times daily as needed., Disp: , Rfl:  ?  SUMAtriptan (IMITREX) 100 MG tablet, Take 100 mg by mouth as directed., Disp: , Rfl:  ?  topiramate (TOPAMAX) 50 MG tablet, Take 50 mg by mouth at bedtime., Disp: , Rfl:  ? ?PHYSICAL EXAM: ?ECOG FS:1 - Symptomatic but completely ambulatory ? ? T: 98.2 F oral    BP: 1569/75     HR: 100     Resp: 20    O2:98% ?Physical Exam ?Vitals and nursing note reviewed.  ?Constitutional:   ?   Appearance: She is well-developed. She is not ill-appearing or toxic-appearing.  ?   Comments: Airway is intact.  Patient speaking in full sentences without respiratory distress.  No facial swelling or angioedema.  ?HENT:  ?   Head: Normocephalic and atraumatic.  ?   Nose: Nose normal.  ?Eyes:  ?   General: No scleral icterus.    ?   Right eye: No discharge.     ?   Left eye: No discharge.  ?   Conjunctiva/sclera: Conjunctivae normal.  ?Neck:  ?   Vascular: No JVD.  ?Cardiovascular:  ?   Rate and Rhythm: Normal rate and regular rhythm.  ?   Pulses: Normal pulses.  ?   Heart sounds: Normal heart sounds.  ?Pulmonary:  ?   Effort: Pulmonary effort is normal. No respiratory distress.  ?   Breath sounds: Normal breath sounds. No stridor. No wheezing, rhonchi or rales.  ?Abdominal:  ?   General: There is no distension.  ?Musculoskeletal:     ?   General: Normal range of motion.  ?   Cervical back: Normal range of motion.  ?Skin: ?   General: Skin is warm and dry.  ?   Findings: No rash.  ?Neurological:  ?   Mental Status: She is  oriented to person, place, and time.  ?   GCS: GCS eye subscore is 4. GCS verbal subscore is 5. GCS motor subscore is 6.  ?   Comments: Fluent speech, no facial droop.  ?Psychiatric:     ?   Behavior: Beh

## 2022-02-12 NOTE — Progress Notes (Signed)
Patient noted itching sensation on chest about 2 hours and 15 minutes into Venofer infusion. Patient noted that this has happened before and occurs both in the presence and absence of receiving an iron infusion.  ?Provider notified and Milinda Cave, PA-C came to chairside to assess patient. Received verbal orders to continue with remainder of iron infusion and provide patient with benadryl PO. Confirmed patient is not driving themselves today.  ?Patient noted improvement of symptoms. No c/o shortness of breath, chest pain, or throat swelling at any point during infusion. Patient observed for 30-minutes and vital signs retaken and remained stable.  ?Per Milinda Cave, PA-C, pt is okay to be d/c. Pt provided with education should she have any further itchiness. Pt verbalized understanding of education.  ?

## 2022-04-08 NOTE — Progress Notes (Incomplete)
HEMATOLOGY-ONCOLOGY TELEPHONE VISIT PROGRESS NOTE  I connected with '@PTNAME'$ @ on 04/08/22 at  2:45 PM EDT by telephone and verified that I am speaking with the correct person using two identifiers.  I discussed the limitations, risks, security and privacy concerns of performing an evaluation and management service by telephone and the availability of in person appointments.  I also discussed with the patient that there may be a patient responsible charge related to this service. The patient expressed understanding and agreed to proceed.   History of Present Illness: [name] is a 46 y.o. with above-mentioned history of iron deficiency anemia. She presents to the clinic via phone visit.  Oncology History   No history exists.    REVIEW OF SYSTEMS:   Constitutional: Denies fevers, chills or abnormal weight loss Eyes: Denies blurriness of vision Ears, nose, mouth, throat, and face: Denies mucositis or sore throat Respiratory: Denies cough, dyspnea or wheezes Cardiovascular: Denies palpitation, chest discomfort Gastrointestinal:  Denies nausea, heartburn or change in bowel habits Skin: Denies abnormal skin rashes Lymphatics: Denies new lymphadenopathy or easy bruising Neurological:Denies numbness, tingling or new weaknesses Behavioral/Psych: Mood is stable, no new changes  Extremities: No lower extremity edema Breast: *** denies any pain or lumps or nodules in either breasts All other systems were reviewed with the patient and are negative. Observations/Objective:     Assessment Plan:  No problem-specific Assessment & Plan notes found for this encounter.    I discussed the assessment and treatment plan with the patient. The patient was provided an opportunity to ask questions and all were answered. The patient agreed with the plan and demonstrated an understanding of the instructions. The patient was advised to call back or seek an in-person evaluation if the symptoms worsen or if the  condition fails to improve as anticipated.   I provided *** minutes of non-face-to-face time during this encounter. Landon Bassford Bryson Ha, CMA  I Gardiner Coins am scribing for Dr. Lindi Adie  ***

## 2022-04-21 ENCOUNTER — Inpatient Hospital Stay: Payer: Medicaid Other | Attending: Hematology and Oncology

## 2022-04-22 ENCOUNTER — Inpatient Hospital Stay: Payer: Medicaid Other | Admitting: Hematology and Oncology

## 2022-04-22 DIAGNOSIS — D509 Iron deficiency anemia, unspecified: Secondary | ICD-10-CM

## 2022-04-22 NOTE — Assessment & Plan Note (Signed)
Most likely cause: Uterine bleeding from fibroids plus malabsorption IV iron treatment: 09/28/2012,January 2020, November 2020, August 2021, Jan 2022 (1 dose), 02/12/22 (1 dose) Longstanding history of iron deficiency(all her life)  Blood work review 09/03/2018: Hemoglobin 9.8, MCV 61, platelet 663 02/26/2019:Iron saturation 12%, ferritin 35improved from 9, hemoglobin 12 improved from 10.3 08/27/2019: Hemoglobin 11, MCV 66, platelets 592, iron saturation 5%, ferritin 28 04/30/2020: Hemoglobin 9.4, MCV 61.9, platelets 624 10/08/2020: Hemoglobin 10.9, MCV 67.2, iron saturation 5%, ferritin 21, platelets 598 02/10/21: Hb 12.8, ferritin 36, Iron Sat 8%, TIBC 363 07/27/2021: Hemoglobin 9 CV 63.8, RDW 17.7, platelets 517, iron studies: Ferritin 17, iron saturation 5%

## 2022-08-05 NOTE — Progress Notes (Signed)
Patient Care Team: Milford Cage, PA as PCP - General (Physician Assistant) Velvet Bathe, MD (Inactive) as Referring Physician (Family Medicine)  DIAGNOSIS:  Encounter Diagnosis  Name Primary?   Iron deficiency anemia, unspecified iron deficiency anemia type     CHIEF COMPLIANT: Follow-up of iron deficiency anemia  INTERVAL HISTORY: [name] is a 46 y.o. with above-mentioned history of iron deficiency anemia who previously received IV iron therapy (last 07/02/20). Labs on 07/27/2021 showed: Hgb 9.0 and HCT 30.1. She presents to the clinic today for follow-up.  She is complaining of profound fatigue and shortness of breath with exertion.   ALLERGIES:  is allergic to doxycycline, fish allergy, fish oil, other, citalopram, amoxicillin, penicillins, and strawberry extract.  MEDICATIONS:  Current Outpatient Medications  Medication Sig Dispense Refill   acetaminophen (TYLENOL 8 HOUR) 650 MG CR tablet Take 1 tablet (650 mg total) by mouth every 8 (eight) hours. 30 tablet 0   albuterol (PROAIR HFA) 108 (90 Base) MCG/ACT inhaler Use 2 puffs every 4 hours as needed for cough or wheeze.  May use 2 puffs 10-20 minutes prior to exercise. 1 Inhaler 0   cetirizine (ZYRTEC) 10 MG tablet Take 10 mg by mouth daily.     cyclobenzaprine (FLEXERIL) 10 MG tablet Take 10 mg by mouth 3 (three) times daily.     ferrous gluconate (FERGON) 324 MG tablet Take 1 tablet (324 mg total) by mouth 2 (two) times daily with a meal. 60 tablet 3   gabapentin (NEURONTIN) 100 MG capsule Take 1 capsule (100 mg total) by mouth 3 (three) times daily.     glipiZIDE (GLUCOTROL XL) 10 MG 24 hr tablet Take 10 mg by mouth daily with breakfast.     lidocaine (LIDODERM) 5 % Place 1 patch onto the skin daily. Remove & Discard patch within 12 hours or as directed by MD 30 patch 0   LINZESS 72 MCG capsule Take 72 mcg by mouth daily as needed.     losartan (COZAAR) 100 MG tablet Take 1 tablet (100 mg total) by mouth daily. 30  tablet 3   metFORMIN (GLUCOPHAGE) 1000 MG tablet Take 1,000 mg by mouth 2 (two) times daily with a meal.     metoprolol succinate (TOPROL-XL) 50 MG 24 hr tablet Take 50 mg by mouth daily.     mometasone-formoterol (DULERA) 200-5 MCG/ACT AERO Inhale 2 puffs into the lungs 2 (two) times daily. 1 Inhaler 0   montelukast (SINGULAIR) 10 MG tablet Take 10 mg by mouth at bedtime.     naproxen (NAPROSYN) 500 MG tablet Take 1 tablet (500 mg total) by mouth 2 (two) times daily. 30 tablet 0   oxyCODONE (ROXICODONE) 15 MG immediate release tablet Take 15 mg by mouth 4 (four) times daily as needed.     SUMAtriptan (IMITREX) 100 MG tablet Take 100 mg by mouth as directed.     topiramate (TOPAMAX) 50 MG tablet Take 50 mg by mouth at bedtime.     No current facility-administered medications for this visit.    PHYSICAL EXAMINATION: ECOG PERFORMANCE STATUS: 1 - Symptomatic but completely ambulatory  There were no vitals filed for this visit. There were no vitals filed for this visit.    LABORATORY DATA:  I have reviewed the data as listed    Latest Ref Rng & Units 12/17/2021    3:20 PM 06/29/2020   11:41 PM 04/30/2020    1:46 PM  CMP  Glucose 70 - 99 mg/dL 128  161  373   BUN 6 - 20 mg/dL '14  12  12   '$ Creatinine 0.44 - 1.00 mg/dL 1.06  0.81  0.89   Sodium 135 - 145 mmol/L 137  138  137   Potassium 3.5 - 5.1 mmol/L 3.8  3.5  4.6   Chloride 98 - 111 mmol/L 109  103  100   CO2 22 - 32 mmol/L '20  22  24   '$ Calcium 8.9 - 10.3 mg/dL 8.8  8.5  8.6   Total Protein 6.5 - 8.1 g/dL 7.8     Total Bilirubin 0.3 - 1.2 mg/dL 0.4     Alkaline Phos 38 - 126 U/L 96     AST 15 - 41 U/L 18     ALT 0 - 44 U/L 16       Lab Results  Component Value Date   WBC 8.6 08/06/2022   HGB 8.7 (L) 08/06/2022   HCT 30.0 (L) 08/06/2022   MCV 60.1 (L) 08/06/2022   PLT 638 (H) 08/06/2022   NEUTROABS 5.1 08/06/2022    ASSESSMENT & PLAN:  Iron deficiency anemia Most likely cause: Uterine bleeding from fibroids plus  malabsorption IV iron treatment: 09/28/2012, January 2020, November 2020, August 2021, Jan 2022 (1 dose) Longstanding history of iron deficiency (all her life)   Blood work review 09/03/2018: Hemoglobin 9.8, MCV 61, platelet 663 02/26/2019: Iron saturation 12%, ferritin 35 improved from 9, hemoglobin 12 improved from 10.3 08/27/2019: Hemoglobin 11, MCV 66, platelets 592, iron saturation 5%, ferritin 28 04/30/2020: Hemoglobin 9.4, MCV 61.9, platelets 624 10/08/2020: Hemoglobin 10.9, MCV 67.2, iron saturation 5%, ferritin 21, platelets 598 02/10/21: Hb 12.8, ferritin 36, Iron Sat 8%, TIBC 363 07/27/2021: Hemoglobin 9 CV 63.8, RDW 17.7, platelets 517, iron studies: Ferritin 17, iron saturation 5% 08/06/2022: Hemoglobin 8.7, MCV 60.1, platelets 638, iron saturation 2%, ferritin 7  Recommendation: IV iron therapy Previously she had itching to IV iron infusions: We will need to premedicate her with Benadryl and Tylenol We will set her up for 3 doses of IV iron Recheck labs in 4 months and telephone visit 2 days after to discuss results   No orders of the defined types were placed in this encounter.  The patient has a good understanding of the overall plan. she agrees with it. she will call with any problems that may develop before the next visit here. Total time spent: 30 mins including face to face time and time spent for planning, charting and co-ordination of care   Harriette Ohara, MD 08/10/22    I Gardiner Coins am scribing for Dr. Lindi Adie  I have reviewed the above documentation for accuracy and completeness, and I agree with the above.

## 2022-08-06 ENCOUNTER — Other Ambulatory Visit: Payer: Self-pay

## 2022-08-06 ENCOUNTER — Inpatient Hospital Stay: Payer: Medicaid Other | Attending: Hematology and Oncology

## 2022-08-06 DIAGNOSIS — D500 Iron deficiency anemia secondary to blood loss (chronic): Secondary | ICD-10-CM

## 2022-08-06 DIAGNOSIS — D509 Iron deficiency anemia, unspecified: Secondary | ICD-10-CM | POA: Diagnosis present

## 2022-08-06 LAB — IRON AND IRON BINDING CAPACITY (CC-WL,HP ONLY)
Iron: 7 ug/dL — ABNORMAL LOW (ref 28–170)
Saturation Ratios: 2 % — ABNORMAL LOW (ref 10.4–31.8)
TIBC: 433 ug/dL (ref 250–450)
UIBC: 426 ug/dL (ref 148–442)

## 2022-08-06 LAB — CBC WITH DIFFERENTIAL (CANCER CENTER ONLY)
Abs Immature Granulocytes: 0.01 10*3/uL (ref 0.00–0.07)
Basophils Absolute: 0 10*3/uL (ref 0.0–0.1)
Basophils Relative: 0 %
Eosinophils Absolute: 0.1 10*3/uL (ref 0.0–0.5)
Eosinophils Relative: 2 %
HCT: 30 % — ABNORMAL LOW (ref 36.0–46.0)
Hemoglobin: 8.7 g/dL — ABNORMAL LOW (ref 12.0–15.0)
Immature Granulocytes: 0 %
Lymphocytes Relative: 34 %
Lymphs Abs: 3 10*3/uL (ref 0.7–4.0)
MCH: 17.4 pg — ABNORMAL LOW (ref 26.0–34.0)
MCHC: 29 g/dL — ABNORMAL LOW (ref 30.0–36.0)
MCV: 60.1 fL — ABNORMAL LOW (ref 80.0–100.0)
Monocytes Absolute: 0.4 10*3/uL (ref 0.1–1.0)
Monocytes Relative: 5 %
Neutro Abs: 5.1 10*3/uL (ref 1.7–7.7)
Neutrophils Relative %: 59 %
Platelet Count: 638 10*3/uL — ABNORMAL HIGH (ref 150–400)
RBC: 4.99 MIL/uL (ref 3.87–5.11)
RDW: 20.8 % — ABNORMAL HIGH (ref 11.5–15.5)
WBC Count: 8.6 10*3/uL (ref 4.0–10.5)
nRBC: 0 % (ref 0.0–0.2)

## 2022-08-06 LAB — FERRITIN: Ferritin: 7 ng/mL — ABNORMAL LOW (ref 11–307)

## 2022-08-10 ENCOUNTER — Inpatient Hospital Stay (HOSPITAL_BASED_OUTPATIENT_CLINIC_OR_DEPARTMENT_OTHER): Payer: Medicaid Other | Admitting: Hematology and Oncology

## 2022-08-10 DIAGNOSIS — D509 Iron deficiency anemia, unspecified: Secondary | ICD-10-CM

## 2022-08-10 NOTE — Assessment & Plan Note (Signed)
Most likely cause: Uterine bleeding from fibroids plus malabsorption IV iron treatment: 09/28/2012,January 2020, November 2020, August 2021, Jan 2022 (1 dose) Longstanding history of iron deficiency(all her life)  Blood work review 09/03/2018: Hemoglobin 9.8, MCV 61, platelet 663 02/26/2019:Iron saturation 12%, ferritin 35improved from 9, hemoglobin 12 improved from 10.3 08/27/2019: Hemoglobin 11, MCV 66, platelets 592, iron saturation 5%, ferritin 28 04/30/2020: Hemoglobin 9.4, MCV 61.9, platelets 624 10/08/2020: Hemoglobin 10.9, MCV 67.2, iron saturation 5%, ferritin 21, platelets 598 02/10/21: Hb 12.8, ferritin 36, Iron Sat 8%, TIBC 363 07/27/2021: Hemoglobin 9 CV 63.8, RDW 17.7, platelets 517, iron studies: Ferritin 17, iron saturation 5% 08/06/2022: Hemoglobin 8.7, MCV 60.1, platelets 638, iron saturation 2%, ferritin 7  Recommendation: IV iron therapy Previously she had itching to IV iron infusions: We will need to premedicate her with Benadryl and Tylenol

## 2022-08-13 ENCOUNTER — Telehealth: Payer: Self-pay | Admitting: Hematology and Oncology

## 2022-08-13 NOTE — Telephone Encounter (Signed)
Called patient to see if she would be okay with receiving IV iron at [facility]. Patient stated that she was okay receiving iron at the [facility] location. Scheduling message was sent to the schedulers at [facility]. Called patient later to schedule FU labs and telephone visit with Der.Gudena. Unable to leave a voicemail due to mailbox being full.

## 2022-08-27 ENCOUNTER — Inpatient Hospital Stay: Payer: Medicaid Other

## 2022-08-27 VITALS — BP 121/64 | HR 76 | Temp 98.1°F | Resp 18

## 2022-08-27 DIAGNOSIS — D500 Iron deficiency anemia secondary to blood loss (chronic): Secondary | ICD-10-CM

## 2022-08-27 DIAGNOSIS — D509 Iron deficiency anemia, unspecified: Secondary | ICD-10-CM | POA: Diagnosis not present

## 2022-08-27 MED ORDER — DIPHENHYDRAMINE HCL 25 MG PO CAPS
50.0000 mg | ORAL_CAPSULE | Freq: Once | ORAL | Status: AC
  Administered 2022-08-27: 50 mg via ORAL
  Filled 2022-08-27: qty 2

## 2022-08-27 MED ORDER — SODIUM CHLORIDE 0.9 % IV SOLN
300.0000 mg | Freq: Once | INTRAVENOUS | Status: AC
  Administered 2022-08-27: 300 mg via INTRAVENOUS
  Filled 2022-08-27: qty 300

## 2022-08-27 MED ORDER — ACETAMINOPHEN 325 MG PO TABS
650.0000 mg | ORAL_TABLET | Freq: Once | ORAL | Status: AC
  Administered 2022-08-27: 650 mg via ORAL
  Filled 2022-08-27: qty 2

## 2022-08-27 MED ORDER — SODIUM CHLORIDE 0.9 % IV SOLN: Freq: Once | INTRAVENOUS | Status: AC

## 2022-08-27 NOTE — Progress Notes (Signed)
Pt declined to stay for 30-minute observation after venofer infusion. VSS at discharge

## 2022-08-27 NOTE — Patient Instructions (Signed)

## 2022-09-03 ENCOUNTER — Inpatient Hospital Stay: Payer: Medicaid Other

## 2022-09-10 ENCOUNTER — Inpatient Hospital Stay: Payer: Medicaid Other

## 2022-09-17 ENCOUNTER — Inpatient Hospital Stay: Payer: Medicaid Other | Attending: Hematology and Oncology

## 2022-09-17 VITALS — BP 138/92 | HR 90 | Temp 98.9°F | Resp 20

## 2022-09-17 DIAGNOSIS — Z79899 Other long term (current) drug therapy: Secondary | ICD-10-CM | POA: Insufficient documentation

## 2022-09-17 DIAGNOSIS — D509 Iron deficiency anemia, unspecified: Secondary | ICD-10-CM | POA: Diagnosis not present

## 2022-09-17 DIAGNOSIS — D500 Iron deficiency anemia secondary to blood loss (chronic): Secondary | ICD-10-CM

## 2022-09-17 MED ORDER — SODIUM CHLORIDE 0.9 % IV SOLN: Freq: Once | INTRAVENOUS | Status: AC

## 2022-09-17 MED ORDER — ACETAMINOPHEN 325 MG PO TABS
650.0000 mg | ORAL_TABLET | Freq: Once | ORAL | Status: DC
  Filled 2022-09-17: qty 2

## 2022-09-17 MED ORDER — DIPHENHYDRAMINE HCL 25 MG PO CAPS
50.0000 mg | ORAL_CAPSULE | Freq: Once | ORAL | Status: AC
  Administered 2022-09-17: 50 mg via ORAL
  Filled 2022-09-17: qty 2

## 2022-09-17 MED ORDER — SODIUM CHLORIDE 0.9 % IV SOLN
300.0000 mg | Freq: Once | INTRAVENOUS | Status: AC
  Administered 2022-09-17: 300 mg via INTRAVENOUS
  Filled 2022-09-17: qty 200

## 2022-09-17 NOTE — Patient Instructions (Signed)

## 2022-12-09 ENCOUNTER — Encounter: Payer: Self-pay | Admitting: Hematology and Oncology

## 2022-12-14 ENCOUNTER — Telehealth: Payer: Self-pay | Admitting: Hematology and Oncology

## 2022-12-14 ENCOUNTER — Inpatient Hospital Stay: Payer: Medicare Other

## 2022-12-14 NOTE — Telephone Encounter (Signed)
Patient called to reschedule appointments from this week to next week due to feeling under the weather. Patient is negative for Covid. Rescheduled patient and notified.

## 2022-12-16 ENCOUNTER — Inpatient Hospital Stay: Payer: Medicare Other | Admitting: Hematology and Oncology

## 2022-12-21 ENCOUNTER — Inpatient Hospital Stay: Payer: Medicare Other | Attending: Hematology and Oncology

## 2022-12-21 NOTE — Progress Notes (Signed)
HEMATOLOGY-ONCOLOGY TELEPHONE VISIT PROGRESS NOTE  I connected with our patient on 12/23/22 at  8:00 AM EST by telephone and verified that I am speaking with the correct person using two identifiers.  I discussed the limitations, risks, security and privacy concerns of performing an evaluation and management service by telephone and the availability of in person appointments.  I also discussed with the patient that there may be a patient responsible charge related to this service. The patient expressed understanding and agreed to proceed.   History of Present Illness: [name] is a 47 y.o. with above-mentioned history of iron deficiency anemia who previously received IV iron therapy (last 07/02/20). Labs on 07/27/2021 showed: Hgb 9.0 and HCT 30.1. She presents to the clinic today for a telephone  follow-up to review labs.  She did not do any lab work as we suggested because she has been suffering from a stomach virus.  She has been having nausea vomiting and diarrhea.    REVIEW OF SYSTEMS:   Constitutional: Denies fevers, chills or abnormal weight loss Abdominal pain nausea vomiting diarrhea All other systems were reviewed with the patient and are negative. Observations/Objective:     Assessment Plan:  Iron deficiency anemia Most likely cause: Uterine bleeding from fibroids plus malabsorption IV iron treatment: 09/28/2012, January 2020, November 2020, August 2021, Jan 2022 (1 dose), Oct 2023 Longstanding history of iron deficiency (all her life)   Blood work review 09/03/2018: Hemoglobin 9.8, MCV 61, platelet 663 02/26/2019: Iron saturation 12%, ferritin 35 improved from 9, hemoglobin 12 improved from 10.3 08/27/2019: Hemoglobin 11, MCV 66, platelets 592, iron saturation 5%, ferritin 28 04/30/2020: Hemoglobin 9.4, MCV 61.9, platelets 624 10/08/2020: Hemoglobin 10.9, MCV 67.2, iron saturation 5%, ferritin 21, platelets 598 02/10/21: Hb 12.8, ferritin 36, Iron Sat 8%, TIBC 363 07/27/2021:  Hemoglobin 9 CV 63.8, RDW 17.7, platelets 517, iron studies: Ferritin 17, iron saturation 5% 08/06/2022: Hemoglobin 8.7, MCV 60.1, platelets 638, iron saturation 2%, ferritin 7  Previously she had itching to IV iron infusions: We will need to premedicate her with Benadryl and Tylenol when she gets I am   Gastrointestinal viral infection: Patient has been down with this for the last 3 days.  Therefore she could not do her lab work.  She thinks that she is stable and will be able to maintain her hydration.  She will call us back to set up an appointment for labs and then a telephone appointment to call and discuss the results once she gets better.    I discussed the assessment and treatment plan with the patient. The patient was provided an opportunity to ask questions and all were answered. The patient agreed with the plan and demonstrated an understanding of the instructions. The patient was advised to call back or seek an in-person evaluation if the symptoms worsen or if the condition fails to improve as anticipated.   I provided 12 minutes of non-face-to-face time during this encounter.  This includes time for charting and coordination of care   Harriette Ohara, MD  I Gardiner Coins am acting as a scribe for Dr.Vinay Gudena  I have reviewed the above documentation for accuracy and completeness, and I agree with the above.

## 2022-12-23 ENCOUNTER — Inpatient Hospital Stay: Payer: Medicare Other | Admitting: Hematology and Oncology

## 2022-12-23 DIAGNOSIS — D509 Iron deficiency anemia, unspecified: Secondary | ICD-10-CM

## 2022-12-23 NOTE — Assessment & Plan Note (Signed)
Most likely cause: Uterine bleeding from fibroids plus malabsorption IV iron treatment: 09/28/2012, January 2020, November 2020, August 2021, Jan 2022 (1 dose), Oct 2023 Longstanding history of iron deficiency (all her life)   Blood work review 09/03/2018: Hemoglobin 9.8, MCV 61, platelet 663 02/26/2019: Iron saturation 12%, ferritin 35 improved from 9, hemoglobin 12 improved from 10.3 08/27/2019: Hemoglobin 11, MCV 66, platelets 592, iron saturation 5%, ferritin 28 04/30/2020: Hemoglobin 9.4, MCV 61.9, platelets 624 10/08/2020: Hemoglobin 10.9, MCV 67.2, iron saturation 5%, ferritin 21, platelets 598 02/10/21: Hb 12.8, ferritin 36, Iron Sat 8%, TIBC 363 07/27/2021: Hemoglobin 9 CV 63.8, RDW 17.7, platelets 517, iron studies: Ferritin 17, iron saturation 5% 08/06/2022: Hemoglobin 8.7, MCV 60.1, platelets 638, iron saturation 2%, ferritin 7   Recommendation: IV iron therapy Previously she had itching to IV iron infusions: We will need to premedicate her with Benadryl and Tylenol We will set her up for 3 doses of IV iron Recheck labs in 4 months and telephone visit 2 days after to discuss results

## 2022-12-24 ENCOUNTER — Telehealth: Payer: Self-pay | Admitting: Hematology and Oncology

## 2022-12-24 NOTE — Telephone Encounter (Signed)
Scheduled appointment per 2/22 los. Left voicemail.

## 2023-01-08 ENCOUNTER — Encounter: Payer: Self-pay | Admitting: Hematology and Oncology

## 2023-01-10 ENCOUNTER — Encounter: Payer: Self-pay | Admitting: Hematology and Oncology

## 2023-01-11 NOTE — Progress Notes (Signed)
HEMATOLOGY-ONCOLOGY TELEPHONE VISIT PROGRESS NOTE  I connected with our patient on 01/17/23 at  8:00 AM EDT by telephone and verified that I am speaking with the correct person using two identifiers.  I discussed the limitations, risks, security and privacy concerns of performing an evaluation and management service by telephone and the availability of in person appointments.  I also discussed with the patient that there may be a patient responsible charge related to this service. The patient expressed understanding and agreed to proceed.   History of Present Illness: [name] is a 47 y.o. with the history of iron deficiency anemia who previously received IV iron therapy. She presents to the clinic for a telephone follow-up to discuss lab results.  She feels extremely exhausted and is without any energy.  REVIEW OF SYSTEMS:   Constitutional: Denies fevers, chills or abnormal weight loss All other systems were reviewed with the patient and are negative. Observations/Objective:     Assessment Plan:  ANEMIA, IRON DEFICIENCY Most likely cause: Uterine bleeding from fibroids plus malabsorption IV iron treatment: 09/28/2012, January 2020, November 2020, August 2021, Jan 2022 (1 dose), Oct 2023 Longstanding history of iron deficiency (all her life)   Blood work review 09/03/2018: Hemoglobin 9.8, MCV 61, platelet 663 02/26/2019: Iron saturation 12%, ferritin 35 improved from 9, hemoglobin 12 improved from 10.3 08/27/2019: Hemoglobin 11, MCV 66, platelets 592, iron saturation 5%, ferritin 28 04/30/2020: Hemoglobin 9.4, MCV 61.9, platelets 624 10/08/2020: Hemoglobin 10.9, MCV 67.2, iron saturation 5%, ferritin 21, platelets 598 02/10/21: Hb 12.8, ferritin 36, Iron Sat 8%, TIBC 363 07/27/2021: Hemoglobin 9 CV 63.8, RDW 17.7, platelets 517, iron studies: Ferritin 17, iron saturation 5% 08/06/2022: Hemoglobin 8.7, MCV 60.1, platelets 638, iron saturation 2%, ferritin 7 01/13/23: Hb 9.7, MCV 65.6,  Ferritin 13, Iron Sat 8%   Previously she had itching to IV iron infusions: We will need to premedicate her with Benadryl and Tylenol when she gets Iron   Gastrointestinal viral infection: She recovered. We will schedule her for 3 doses of IV iron with Benadryl and Tylenol to prevent itching Return to clinic 3 to 4 months for recheck of labs and follow-up     I discussed the assessment and treatment plan with the patient. The patient was provided an opportunity to ask questions and all were answered. The patient agreed with the plan and demonstrated an understanding of the instructions. The patient was advised to call back or seek an in-person evaluation if the symptoms worsen or if the condition fails to improve as anticipated.   I provided 12 minutes of non-face-to-face time during this encounter.  This includes time for charting and coordination of care   Harriette Ohara, MD

## 2023-01-13 ENCOUNTER — Inpatient Hospital Stay: Payer: Medicare HMO | Attending: Hematology and Oncology

## 2023-01-13 DIAGNOSIS — D509 Iron deficiency anemia, unspecified: Secondary | ICD-10-CM | POA: Insufficient documentation

## 2023-01-13 LAB — IRON AND IRON BINDING CAPACITY (CC-WL,HP ONLY)
Iron: 30 ug/dL (ref 28–170)
Saturation Ratios: 8 % — ABNORMAL LOW (ref 10.4–31.8)
TIBC: 378 ug/dL (ref 250–450)
UIBC: 348 ug/dL (ref 148–442)

## 2023-01-13 LAB — CBC WITH DIFFERENTIAL (CANCER CENTER ONLY)
Abs Immature Granulocytes: 0.01 10*3/uL (ref 0.00–0.07)
Basophils Absolute: 0 10*3/uL (ref 0.0–0.1)
Basophils Relative: 0 %
Eosinophils Absolute: 0.1 10*3/uL (ref 0.0–0.5)
Eosinophils Relative: 1 %
HCT: 32.8 % — ABNORMAL LOW (ref 36.0–46.0)
Hemoglobin: 9.7 g/dL — ABNORMAL LOW (ref 12.0–15.0)
Immature Granulocytes: 0 %
Lymphocytes Relative: 29 %
Lymphs Abs: 2.1 10*3/uL (ref 0.7–4.0)
MCH: 19.4 pg — ABNORMAL LOW (ref 26.0–34.0)
MCHC: 29.6 g/dL — ABNORMAL LOW (ref 30.0–36.0)
MCV: 65.6 fL — ABNORMAL LOW (ref 80.0–100.0)
Monocytes Absolute: 0.4 10*3/uL (ref 0.1–1.0)
Monocytes Relative: 5 %
Neutro Abs: 4.7 10*3/uL (ref 1.7–7.7)
Neutrophils Relative %: 65 %
Platelet Count: 448 10*3/uL — ABNORMAL HIGH (ref 150–400)
RBC: 5 MIL/uL (ref 3.87–5.11)
RDW: 19.4 % — ABNORMAL HIGH (ref 11.5–15.5)
WBC Count: 7.3 10*3/uL (ref 4.0–10.5)
nRBC: 0 % (ref 0.0–0.2)

## 2023-01-14 LAB — FERRITIN: Ferritin: 13 ng/mL (ref 11–307)

## 2023-01-16 NOTE — Assessment & Plan Note (Signed)
Most likely cause: Uterine bleeding from fibroids plus malabsorption IV iron treatment: 09/28/2012, January 2020, November 2020, August 2021, Jan 2022 (1 dose), Oct 2023 Longstanding history of iron deficiency (all her life)   Blood work review 09/03/2018: Hemoglobin 9.8, MCV 61, platelet 663 02/26/2019: Iron saturation 12%, ferritin 35 improved from 9, hemoglobin 12 improved from 10.3 08/27/2019: Hemoglobin 11, MCV 66, platelets 592, iron saturation 5%, ferritin 28 04/30/2020: Hemoglobin 9.4, MCV 61.9, platelets 624 10/08/2020: Hemoglobin 10.9, MCV 67.2, iron saturation 5%, ferritin 21, platelets 598 02/10/21: Hb 12.8, ferritin 36, Iron Sat 8%, TIBC 363 07/27/2021: Hemoglobin 9 CV 63.8, RDW 17.7, platelets 517, iron studies: Ferritin 17, iron saturation 5% 08/06/2022: Hemoglobin 8.7, MCV 60.1, platelets 638, iron saturation 2%, ferritin 7 01/13/23: Hb 9.7, MCV 65.6, Ferritin 13, Iron Sat 8%   Previously she had itching to IV iron infusions: We will need to premedicate her with Benadryl and Tylenol when she gets Iron   Gastrointestinal viral infection: She recovered.

## 2023-01-17 ENCOUNTER — Inpatient Hospital Stay (HOSPITAL_BASED_OUTPATIENT_CLINIC_OR_DEPARTMENT_OTHER): Payer: Medicare HMO | Admitting: Hematology and Oncology

## 2023-01-17 DIAGNOSIS — D500 Iron deficiency anemia secondary to blood loss (chronic): Secondary | ICD-10-CM

## 2023-01-18 ENCOUNTER — Telehealth: Payer: Self-pay | Admitting: Hematology and Oncology

## 2023-01-18 NOTE — Telephone Encounter (Signed)
Per 3/18 LOS. Left  voicemail for patient.

## 2023-01-28 ENCOUNTER — Inpatient Hospital Stay: Payer: Medicare HMO

## 2023-02-01 ENCOUNTER — Emergency Department (HOSPITAL_COMMUNITY)
Admission: EM | Admit: 2023-02-01 | Discharge: 2023-02-02 | Disposition: A | Discharge status: Home / Self Care | Payer: Medicare HMO | Attending: Emergency Medicine | Admitting: Emergency Medicine

## 2023-02-01 ENCOUNTER — Emergency Department (HOSPITAL_COMMUNITY): Payer: Medicare HMO

## 2023-02-01 ENCOUNTER — Encounter: Payer: Self-pay | Admitting: Hematology and Oncology

## 2023-02-01 ENCOUNTER — Encounter (HOSPITAL_COMMUNITY): Payer: Self-pay | Admitting: *Deleted

## 2023-02-01 ENCOUNTER — Other Ambulatory Visit: Payer: Self-pay

## 2023-02-01 DIAGNOSIS — J45909 Unspecified asthma, uncomplicated: Secondary | ICD-10-CM | POA: Diagnosis not present

## 2023-02-01 DIAGNOSIS — Z7984 Long term (current) use of oral hypoglycemic drugs: Secondary | ICD-10-CM | POA: Insufficient documentation

## 2023-02-01 DIAGNOSIS — E119 Type 2 diabetes mellitus without complications: Secondary | ICD-10-CM | POA: Diagnosis not present

## 2023-02-01 DIAGNOSIS — R1031 Right lower quadrant pain: Secondary | ICD-10-CM | POA: Diagnosis present

## 2023-02-01 DIAGNOSIS — I10 Essential (primary) hypertension: Secondary | ICD-10-CM | POA: Diagnosis not present

## 2023-02-01 LAB — URINALYSIS, ROUTINE W REFLEX MICROSCOPIC
Bilirubin Urine: NEGATIVE
Glucose, UA: NEGATIVE mg/dL
Ketones, ur: NEGATIVE mg/dL
Leukocytes,Ua: NEGATIVE
Nitrite: NEGATIVE
Protein, ur: 30 mg/dL — AB
Specific Gravity, Urine: 1.016 (ref 1.005–1.030)
pH: 5 (ref 5.0–8.0)

## 2023-02-01 LAB — COMPREHENSIVE METABOLIC PANEL
ALT: 25 U/L (ref 0–44)
AST: 31 U/L (ref 15–41)
Albumin: 3.8 g/dL (ref 3.5–5.0)
Alkaline Phosphatase: 92 U/L (ref 38–126)
Anion gap: 10 (ref 5–15)
BUN: 14 mg/dL (ref 6–20)
CO2: 23 mmol/L (ref 22–32)
Calcium: 9 mg/dL (ref 8.9–10.3)
Chloride: 102 mmol/L (ref 98–111)
Creatinine, Ser: 1.14 mg/dL — ABNORMAL HIGH (ref 0.44–1.00)
GFR, Estimated: 60 mL/min — ABNORMAL LOW (ref >60)
Glucose, Bld: 161 mg/dL — ABNORMAL HIGH (ref 70–99)
Potassium: 3.5 mmol/L (ref 3.5–5.1)
Sodium: 135 mmol/L (ref 135–145)
Total Bilirubin: 0.5 mg/dL (ref 0.3–1.2)
Total Protein: 8.5 g/dL — ABNORMAL HIGH (ref 6.5–8.1)

## 2023-02-01 LAB — CBC
HCT: 37.7 % (ref 36.0–46.0)
Hemoglobin: 11 g/dL — ABNORMAL LOW (ref 12.0–15.0)
MCH: 19.2 pg — ABNORMAL LOW (ref 26.0–34.0)
MCHC: 29.2 g/dL — ABNORMAL LOW (ref 30.0–36.0)
MCV: 65.7 fL — ABNORMAL LOW (ref 80.0–100.0)
Platelets: 720 10*3/uL — ABNORMAL HIGH (ref 150–400)
RBC: 5.74 MIL/uL — ABNORMAL HIGH (ref 3.87–5.11)
RDW: 20.7 % — ABNORMAL HIGH (ref 11.5–15.5)
WBC: 6 10*3/uL (ref 4.0–10.5)
nRBC: 0 % (ref 0.0–0.2)

## 2023-02-01 LAB — LACTIC ACID, PLASMA: Lactic Acid, Venous: 1.1 mmol/L (ref 0.5–1.9)

## 2023-02-01 LAB — PREGNANCY, URINE: Preg Test, Ur: NEGATIVE

## 2023-02-01 LAB — LIPASE, BLOOD: Lipase: 28 U/L (ref 11–51)

## 2023-02-01 MED ORDER — ONDANSETRON HCL 4 MG/2ML IJ SOLN
4.0000 mg | Freq: Once | INTRAMUSCULAR | Status: AC
  Administered 2023-02-01: 4 mg via INTRAVENOUS
  Filled 2023-02-01: qty 2

## 2023-02-01 MED ORDER — IOHEXOL 300 MG/ML  SOLN
100.0000 mL | Freq: Once | INTRAMUSCULAR | Status: AC | PRN
  Administered 2023-02-01: 100 mL via INTRAVENOUS

## 2023-02-01 MED ORDER — KETOROLAC TROMETHAMINE 15 MG/ML IJ SOLN
15.0000 mg | Freq: Once | INTRAMUSCULAR | Status: AC
  Administered 2023-02-02: 15 mg via INTRAVENOUS
  Filled 2023-02-01: qty 1

## 2023-02-01 MED ORDER — ACETAMINOPHEN 500 MG PO TABS
1000.0000 mg | ORAL_TABLET | Freq: Once | ORAL | Status: AC
  Administered 2023-02-02: 1000 mg via ORAL
  Filled 2023-02-01: qty 2

## 2023-02-01 NOTE — ED Provider Notes (Signed)
[place] EMERGENCY DEPARTMENT AT [facility] Provider Note   CSN: [id number] Arrival date & time: 02/01/23  1920     History  Chief Complaint  Patient presents with   Abdominal Pain    [name] is a 47 y.o. female with HTN, HLD, asthma, OSA, palpitations, menorrhagia, fibroids, IDA, anxiety/panic disorder who presents with abdominal pain.   Pt c/o Rlower abd pain "charlie-horse like pain" radiating into her back for several months, comes and goes, worse when she is active. Today it came on and has been getting worse and worse. Feels almost like a contraction. Today was the worst its ever been lasted x 15 minutes today and then did go away again. Did have an episode of nausea/vomiting on Sunday. Just started monjauro weight loss shot about 3 weeks ago, but was having this issue before she started the shot. Last BM was yesterday, normal. Denies urinary symptoms, vaginal symptoms, diarrhea/constipation/melena/hematochezia. Endorses a fever on Sunday but unsure what it was. Last menstrual period was 01/18/23. Has h/o cholecystectomy and remote h/o C/S.    Abdominal Pain      Home Medications Prior to Admission medications   Medication Sig Start Date End Date Taking? Authorizing Provider  acetaminophen (TYLENOL 8 HOUR) 650 MG CR tablet Take 1 tablet (650 mg total) by mouth every 8 (eight) hours. 05/01/21   Derwood Kaplan, MD  albuterol (PROAIR HFA) 108 (90 Base) MCG/ACT inhaler Use 2 puffs every 4 hours as needed for cough or wheeze.  May use 2 puffs 10-20 minutes prior to exercise. 02/09/16   Fletcher Anon, MD  cetirizine (ZYRTEC) 10 MG tablet Take 10 mg by mouth daily.    [provider]  cyclobenzaprine (FLEXERIL) 10 MG tablet Take 10 mg by mouth 3 (three) times daily.    [provider]  ferrous gluconate (FERGON) 324 MG tablet Take 1 tablet (324 mg total) by mouth 2 (two) times daily with a meal. 07/28/21   Serena Croissant, MD  gabapentin (NEURONTIN)  100 MG capsule Take 1 capsule (100 mg total) by mouth 3 (three) times daily. 06/13/20   Serena Croissant, MD  glipiZIDE (GLUCOTROL XL) 10 MG 24 hr tablet Take 10 mg by mouth daily with breakfast.    [provider]  lidocaine (LIDODERM) 5 % Place 1 patch onto the skin daily. Remove & Discard patch within 12 hours or as directed by MD 05/01/21   Derwood Kaplan, MD  LINZESS 72 MCG capsule Take 72 mcg by mouth daily as needed. 01/15/21   [provider]  losartan (COZAAR) 100 MG tablet Take 1 tablet (100 mg total) by mouth daily. 02/02/13   Leroy Sea, MD  metFORMIN (GLUCOPHAGE) 1000 MG tablet Take 1,000 mg by mouth 2 (two) times daily with a meal.    [provider]  metoprolol succinate (TOPROL-XL) 50 MG 24 hr tablet Take 50 mg by mouth daily. 02/16/21   [provider]  mometasone-formoterol (DULERA) 200-5 MCG/ACT AERO Inhale 2 puffs into the lungs 2 (two) times daily. 11/11/15   Fletcher Anon, MD  montelukast (SINGULAIR) 10 MG tablet Take 10 mg by mouth at bedtime.    [provider]  naproxen (NAPROSYN) 500 MG tablet Take 1 tablet (500 mg total) by mouth 2 (two) times daily. 05/01/21   Derwood Kaplan, MD  oxyCODONE (ROXICODONE) 15 MG immediate release tablet Take 15 mg by mouth 4 (four) times daily as needed. 03/20/21   [provider]  SUMAtriptan (  IMITREX) 100 MG tablet Take 100 mg by mouth as directed. 03/16/21   [provider]  topiramate (TOPAMAX) 50 MG tablet Take 50 mg by mouth at bedtime. 01/19/21   [provider]      Allergies    Doxycycline, Fish allergy, Fish oil, Other, Citalopram, Amoxicillin, Penicillins, and Strawberry extract    Review of Systems   Review of Systems  Gastrointestinal:  Positive for abdominal pain.   Review of systems positive for fevers.  A 10 point review of systems was performed and is negative unless otherwise reported in HPI.  Physical Exam Updated Vital Signs BP 132/87   Pulse 95    Temp 98.5 F (36.9 C)   Resp 16   Ht  (1.753 m)   Wt (!) 190.1 kg   LMP 01/18/2023   SpO2 99%   BMI 61.88 kg/m  Physical Exam General: Normal appearing obese female, lying in bed.  HEENT: PERRLA, Sclera anicteric, MMM, trachea midline.  Cardiology: RRR, no murmurs/rubs/gallops. BL radial and DP pulses equal bilaterally.  Resp: Normal respiratory rate and effort. CTAB, no wheezes, rhonchi, crackles.  Abd: Mild tenderness palpation in the right lower quadrant.  Soft, non-distended. No rebound tenderness or guarding.  GU: Deferred. MSK: No peripheral edema or signs of trauma. Extremities without deformity or TTP. No cyanosis or clubbing. Skin: warm, dry. No rashes or lesions. Back: +R CVA TTP. No midline C, T, or L spine TTP or stepoffs.  Neuro: A&Ox4, CNs II-XII grossly intact. MAEs. Sensation grossly intact.  Psych: Normal mood and affect.   ED Results / Procedures / Treatments   Labs (all labs ordered are listed, but only abnormal results are displayed) Labs Reviewed  COMPREHENSIVE METABOLIC PANEL - Abnormal; Notable for the following components:      Result Value   Glucose, Bld 161 (*)    Creatinine, Ser 1.14 (*)    Total Protein 8.5 (*)    GFR, Estimated 60 (*)    All other components within normal limits  CBC - Abnormal; Notable for the following components:   RBC 5.74 (*)    Hemoglobin 11.0 (*)    MCV 65.7 (*)    MCH 19.2 (*)    MCHC 29.2 (*)    RDW 20.7 (*)    Platelets 720 (*)    All other components within normal limits  URINALYSIS, ROUTINE W REFLEX MICROSCOPIC - Abnormal; Notable for the following components:   APPearance CLOUDY (*)    Hgb urine dipstick MODERATE (*)    Protein, ur 30 (*)    Bacteria, UA RARE (*)    All other components within normal limits  LIPASE, BLOOD  PREGNANCY, URINE  LACTIC ACID, PLASMA    EKG None  Radiology See ED course  Procedures Procedures    Medications Ordered in ED Medications  ondansetron (ZOFRAN)  injection 4 mg (4 mg Intravenous Given 02/01/23 2126)  iohexol (OMNIPAQUE) 300 MG/ML solution 100 mL (100 mLs Intravenous Contrast Given 02/01/23 2257)  acetaminophen (TYLENOL) tablet 1,000 mg (1,000 mg Oral Given 02/02/23 0017)  ketorolac (TORADOL) 15 MG/ML injection 15 mg (15 mg Intravenous Given 02/02/23 0017)    ED Course/ Medical Decision Making/ A&P                          Medical Decision Making Amount and/or Complexity of Data Reviewed Labs: ordered. Decision-making details documented in ED Course. Radiology: ordered. Decision-making details documented in ED Course.  Risk OTC drugs. Prescription drug management.    This patient presents to the ED for concern of abdominal pain, this involves an extensive number of treatment options, and is a complaint that carries with it a high risk of complications and morbidity.  I considered the following differential and admission for this acute, potentially life threatening condition. However patient is overall well-appearing and HDS.   MDM:    For DDX for abdominal pain includes but is not limited to:  Abdominal exam without peritoneal signs. No evidence of acute abdomen at this time. Given colicky nature of pain, consider biliary colic or renal colic as possible causes. Low suspicion for acute pancreatitis (neg lipase), PUD (including gastric perforation), acute infectious processes (pneumonia, hepatitis), acute appendicitis, vascular catastrophe, bowel obstruction, or ovarian torsion, diverticulitis. Consider possible side effect of monjauro however patient states these symptoms began before she started that medication.  Also possible she is having a muscle spasm or cramp given the description of her pain as a charley horse type pain.   Clinical Course as of 02/16/23 0817  Tue Feb 01, 2023  2027 WBC: 6.0 No leukocytosis [HN]  2027 Creatinine(!): 1.14 BL ~1 [HN]  2027 Hemoglobin(!): 11.0 Increased from 9 [HN]  2027 Lipase: 28 [HN]  2321  CT ABDOMEN PELVIS W CONTRAST 1. No acute intra-abdominal or intrapelvic process. Normal appendix. [HN]  2329 Urinalysis, Routine w reflex microscopic -Urine, Clean Catch(!) High squams but no e/o infection [HN]    Clinical Course User Index [HN] Loetta Rough, MD    Labs: I Ordered, and personally interpreted labs.  The pertinent results include:  those listed above  Imaging Studies ordered: I ordered imaging studies including CT abd pelvis w/ contrast I independently visualized and interpreted imaging. I agree with the radiologist interpretation  Additional history obtained from chart review.    Cardiac Monitoring: The patient was maintained on a cardiac monitor.  I personally viewed and interpreted the cardiac monitored which showed an underlying rhythm of: NSR  Reevaluation: After the interventions noted above, I reevaluated the patient and found that they have :improved  Social Determinants of Health: Patient lives independently   Disposition: Patient's imaging is negative and patient is very relieved.  I have higher suspicion at this time for abdominal wall or flank muscle spasm given patient's description as a charley horse type cramping pain.  Patient's labs demonstrate likely volume contraction and mild dehydration and I advised patient to stay well-hydrated which can help muscle cramping. I gave her extensive return precautions related to abdominal pain and back pain and patient reported understanding.  Advised her to follow-up with her primary care provider in 1 to 2 weeks.  DC w/ discharge instructions/return precautions. All questions answered to patient's satisfaction.    Co morbidities that complicate the patient evaluation  Past Medical History:  Diagnosis Date   Allergic rhinitis    Anemia    Anxiety disorder    Asthma    Benign essential hypertension    Depression    Diabetes mellitus without complication    Edema    History of blood clots     Hyperglycemia    Lumbar radiculopathy    Metrorrhagia    Morbid obesity    OSA (obstructive sleep apnea)    Sickle cell trait    Snoring      Medicines Meds ordered this encounter  Medications   ondansetron (ZOFRAN) injection 4 mg   iohexol (OMNIPAQUE) 300 MG/ML solution 100 mL  acetaminophen (TYLENOL) tablet 1,000 mg   ketorolac (TORADOL) 15 MG/ML injection 15 mg    I have reviewed the patients home medicines and have made adjustments as needed  Problem List / ED Course: Problem List Items Addressed This Visit   None Visit Diagnoses     Right lower quadrant abdominal pain    -  Primary                   This note was created using dictation software, which may contain spelling or grammatical errors.    Loetta Rough, MD 02/16/23 [phone]

## 2023-02-01 NOTE — Discharge Instructions (Signed)
Thank you for coming to [facility] Emergency [facility]. You were seen for abdominal pain. We did an exam, labs, and imaging, and these showed no acute findings. You can alternate taking Tylenol and ibuprofen as needed for pain. You can take 650mg  tylenol (acetaminophen) every 4-6 hours, and 600 mg ibuprofen 3 times a day.  Please follow up with your primary care provider within 1 week.   Do not hesitate to return to the ED or call 911 if you experience: -Worsening symptoms -Nausea/vomiting so severe you cannot eat/drink anything -Lightheadedness, passing out -Fevers/chills -Anything else that concerns you

## 2023-02-01 NOTE — ED Triage Notes (Signed)
Pt c/o Rlower abd pain radiating into her back for a while, reports seeing her PCP and is supposed to have a CT ordered. Pain worsening last night. Reports episode of NV

## 2023-02-02 DIAGNOSIS — R1031 Right lower quadrant pain: Secondary | ICD-10-CM | POA: Diagnosis not present

## 2023-02-04 ENCOUNTER — Other Ambulatory Visit: Payer: Self-pay

## 2023-02-04 ENCOUNTER — Inpatient Hospital Stay: Payer: Medicare HMO | Attending: Hematology and Oncology

## 2023-02-04 VITALS — BP 124/74 | HR 76 | Temp 98.0°F | Resp 16

## 2023-02-04 DIAGNOSIS — D509 Iron deficiency anemia, unspecified: Secondary | ICD-10-CM | POA: Diagnosis not present

## 2023-02-04 DIAGNOSIS — Z79899 Other long term (current) drug therapy: Secondary | ICD-10-CM | POA: Insufficient documentation

## 2023-02-04 DIAGNOSIS — D500 Iron deficiency anemia secondary to blood loss (chronic): Secondary | ICD-10-CM

## 2023-02-04 MED ORDER — DIPHENHYDRAMINE HCL 25 MG PO CAPS
50.0000 mg | ORAL_CAPSULE | Freq: Once | ORAL | Status: AC
  Administered 2023-02-04: 50 mg via ORAL
  Filled 2023-02-04: qty 2

## 2023-02-04 MED ORDER — SODIUM CHLORIDE 0.9 % IV SOLN: Freq: Once | INTRAVENOUS | Status: AC

## 2023-02-04 MED ORDER — SODIUM CHLORIDE 0.9 % IV SOLN
300.0000 mg | Freq: Once | INTRAVENOUS | Status: AC
  Administered 2023-02-04: 300 mg via INTRAVENOUS
  Filled 2023-02-04: qty 300

## 2023-02-04 MED ORDER — ACETAMINOPHEN 325 MG PO TABS
650.0000 mg | ORAL_TABLET | Freq: Once | ORAL | Status: AC
  Administered 2023-02-04: 650 mg via ORAL
  Filled 2023-02-04: qty 2

## 2023-02-04 NOTE — Progress Notes (Signed)
Pt observed for 15 minutes post Venofer infusion. Pt tolerated trtmt well w/out incident. VSS at discharge.  Ambulatory to lobby.   

## 2023-02-04 NOTE — Patient Instructions (Signed)

## 2023-02-11 ENCOUNTER — Telehealth: Payer: Self-pay | Admitting: Hematology and Oncology

## 2023-02-11 ENCOUNTER — Inpatient Hospital Stay: Payer: Medicare HMO

## 2023-02-11 ENCOUNTER — Telehealth: Payer: Self-pay | Admitting: *Deleted

## 2023-02-11 NOTE — Telephone Encounter (Signed)
Contacted patient to scheduled appointments. Left message with for patient to call back to schedule missed appointments.

## 2023-02-11 NOTE — Telephone Encounter (Signed)
Pt no show to IV iron infusion this morning.  RN attempt x1 to contact pt.  No answer.  LVM for pt to return call to the office. RN sent message to scheduling team to reschedule missed appt.

## 2023-05-13 ENCOUNTER — Encounter: Payer: Self-pay | Admitting: Hematology and Oncology

## 2023-05-20 ENCOUNTER — Inpatient Hospital Stay: Payer: Medicare HMO | Attending: Hematology and Oncology

## 2023-05-24 ENCOUNTER — Inpatient Hospital Stay: Payer: Medicare HMO | Admitting: Hematology and Oncology

## 2023-05-24 ENCOUNTER — Telehealth: Payer: Self-pay

## 2023-05-24 NOTE — Assessment & Plan Note (Deleted)
Most likely cause: Uterine bleeding from fibroids plus malabsorption IV iron treatment: 09/28/2012, January 2020, November 2020, August 2021, Jan 2022 (1 dose), Oct 2023 Longstanding history of iron deficiency (all her life)   Blood work review 09/03/2018: Hemoglobin 9.8, MCV 61, platelet 663 02/26/2019: Iron saturation 12%, ferritin 35 improved from 9, hemoglobin 12 improved from 10.3 08/27/2019: Hemoglobin 11, MCV 66, platelets 592, iron saturation 5%, ferritin 28 04/30/2020: Hemoglobin 9.4, MCV 61.9, platelets 624 10/08/2020: Hemoglobin 10.9, MCV 67.2, iron saturation 5%, ferritin 21, platelets 598 02/10/21: Hb 12.8, ferritin 36, Iron Sat 8%, TIBC 363 07/27/2021: Hemoglobin 9 CV 63.8, RDW 17.7, platelets 517, iron studies: Ferritin 17, iron saturation 5% 08/06/2022: Hemoglobin 8.7, MCV 60.1, platelets 638, iron saturation 2%, ferritin 7 01/13/23: Hb 9.7, MCV 65.6, Ferritin 13, Iron Sat 8%   Previously she had itching to IV iron infusions: We will need to premedicate her with Benadryl and Tylenol when she gets Iron   Gastrointestinal viral infection: She recovered. We will schedule her for 3 doses of IV iron with Benadryl and Tylenol to prevent itching Return to clinic 3 to 4 months for recheck of labs and follow-up

## 2023-05-24 NOTE — Telephone Encounter (Signed)
Called and LVM regarding MD telephone appt. Pt was a no show to lab on 05/20/23, so we are cancelling 05/24/23 MD appt as there will be no labs to discuss. Gave call back number and advised Pt to call to reschedule appts.

## 2023-07-14 ENCOUNTER — Telehealth: Payer: Self-pay | Admitting: *Deleted

## 2023-07-14 NOTE — Telephone Encounter (Signed)
This RN returned call to pt per her VM stating need to change date of lab - per discussion she thought it was for today but it is scheduled tomorrow.  Note she also stated she has been diagnosed with Breast Cancer ( she was just called) and has an appt tomorrow afternoon with oncologist at [facility].  She states she will keep that appt at [facility] presently but would like to "regroup" and see about seeing Dr Pamelia Hoit for the breast cancer.  "He has been the first doctor who has just explained things so well to me " " I do not want to give him up"  Plan at present is to keep appts as scheduled and follow up next week at phone visit.  Noted records per breast cancer is in Care Everywhere

## 2023-07-15 ENCOUNTER — Inpatient Hospital Stay: Payer: Medicare HMO | Attending: Hematology and Oncology

## 2023-07-15 DIAGNOSIS — D509 Iron deficiency anemia, unspecified: Secondary | ICD-10-CM | POA: Insufficient documentation

## 2023-07-19 ENCOUNTER — Inpatient Hospital Stay (HOSPITAL_BASED_OUTPATIENT_CLINIC_OR_DEPARTMENT_OTHER): Payer: Medicare HMO | Admitting: Hematology and Oncology

## 2023-07-19 ENCOUNTER — Encounter: Payer: Self-pay | Admitting: *Deleted

## 2023-07-19 ENCOUNTER — Encounter: Payer: Self-pay | Admitting: Hematology and Oncology

## 2023-07-19 VITALS — BP 147/88 | HR 115 | Temp 97.0°F | Resp 18 | Ht 69.0 in | Wt >= 6400 oz

## 2023-07-19 DIAGNOSIS — D500 Iron deficiency anemia secondary to blood loss (chronic): Secondary | ICD-10-CM

## 2023-07-19 DIAGNOSIS — C773 Secondary and unspecified malignant neoplasm of axilla and upper limb lymph nodes: Secondary | ICD-10-CM | POA: Diagnosis not present

## 2023-07-19 DIAGNOSIS — C50912 Malignant neoplasm of unspecified site of left female breast: Secondary | ICD-10-CM | POA: Diagnosis not present

## 2023-07-19 DIAGNOSIS — D509 Iron deficiency anemia, unspecified: Secondary | ICD-10-CM | POA: Diagnosis present

## 2023-07-19 MED ORDER — ONDANSETRON HCL 8 MG PO TABS
ORAL_TABLET | ORAL | 1 refills | Status: DC
Start: 2023-07-19 — End: 2023-08-15

## 2023-07-19 MED ORDER — DEXAMETHASONE 4 MG PO TABS
ORAL_TABLET | ORAL | 0 refills | Status: DC
Start: 2023-07-19 — End: 2023-09-02

## 2023-07-19 MED ORDER — LIDOCAINE-PRILOCAINE 2.5-2.5 % EX CREA
TOPICAL_CREAM | CUTANEOUS | 3 refills | Status: DC
Start: 2023-07-19 — End: 2023-08-15

## 2023-07-19 MED ORDER — PROCHLORPERAZINE MALEATE 10 MG PO TABS
10.0000 mg | ORAL_TABLET | Freq: Four times a day (QID) | ORAL | 1 refills | Status: DC | PRN
Start: 2023-07-19 — End: 2024-01-31

## 2023-07-19 NOTE — Progress Notes (Signed)
START ON PATHWAY REGIMEN - Breast     Cycles 1 through 4: A cycle is every 14 days:     Doxorubicin      Cyclophosphamide      [id number]    Cycles 5 through 16: A cycle is every 7 days:     Paclitaxel   **Always confirm dose/schedule in your pharmacy ordering system**  Patient Characteristics: Preoperative or Nonsurgical Candidate, M0 (Clinical Staging), Up to cT4c, Any N, M0, Neoadjuvant Therapy followed by Surgery, Invasive Disease, Chemotherapy, HER2 Negative, ER Positive Therapeutic Status: Preoperative or Nonsurgical Candidate, M0 (Clinical Staging) AJCC M Category: cM0 AJCC Grade: GX ER Status: Positive (+) AJCC 8 Stage Grouping: Unknown HER2 Status: Negative (-) AJCC T Category: cTX AJCC N Category: cN1 PR Status: Positive (+) Breast Surgical Plan: Neoadjuvant Therapy followed by Surgery Intent of Therapy: Curative Intent, Discussed with Patient

## 2023-07-19 NOTE — Progress Notes (Signed)
Patient Care Team: Paschal Dopp, PA as PCP - General (Physician Assistant) Penny Pia, MD (Inactive) as Referring Physician (Family Medicine)  DIAGNOSIS:  Encounter Diagnoses  Name Primary?   Iron deficiency anemia due to chronic blood loss Yes   Breast cancer metastasized to axillary lymph node, left (HCC)     SUMMARY OF ONCOLOGIC HISTORY: Oncology History  Breast cancer metastasized to axillary lymph node, left (HCC)  07/12/2023 Initial Diagnosis   Patient palpated a lump in the left axilla.  Mammogram did not reveal any primary breast tumor.  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+     CHIEF COMPLIANT:   Discussed the use of AI scribe software for clinical note transcription with the patient, who gave verbal consent to proceed.  History of Present Illness   [name], a 47 year old patient, noticed a lump in her breast approximately one to two months ago. She promptly reported this to her healthcare provider during a routine appointment, which led to a mammogram and subsequent biopsy. The cancer cells are estrogen and progesterone receptor positive, indicating that they are feeding on these hormones to grow. The cancer is HER2 negative, which is a favorable prognostic factor as HER2 positive cancers tend to be more aggressive.  [name] also reports feeling another lump near her nipple, which will need further investigation. She has a significant family history of cancer, with her father having suffered from mesothelioma, prostate cancer, and acute leukemia, and her siblings also having been diagnosed with cancer. She is concerned about a potential genetic predisposition to cancer, possibly related to her father's exposure to asbestos.  [name] is currently in the perimenopausal stage, having not had a menstrual cycle since April but not yet having reached a full year without menstruation. She is also trying to lose weight and is  currently on a weight loss medication, Mounjaro.         ALLERGIES:  is allergic to doxycycline, fish allergy, fish oil, other, pollen extract, citalopram, amoxicillin, penicillins, and strawberry extract.  MEDICATIONS:  Current Outpatient Medications  Medication Sig Dispense Refill   albuterol (PROAIR HFA) 108 (90 Base) MCG/ACT inhaler Use 2 puffs every 4 hours as needed for cough or wheeze.  May use 2 puffs 10-20 minutes prior to exercise. 1 Inhaler 0   cetirizine (ZYRTEC) 10 MG tablet Take 10 mg by mouth daily.     cyclobenzaprine (FLEXERIL) 10 MG tablet Take 10 mg by mouth 3 (three) times daily.     gabapentin (NEURONTIN) 100 MG capsule Take 1 capsule (100 mg total) by mouth 3 (three) times daily.     glipiZIDE (GLUCOTROL XL) 10 MG 24 hr tablet Take 10 mg by mouth daily with breakfast.     LINZESS 72 MCG capsule Take 72 mcg by mouth daily as needed.     losartan (COZAAR) 100 MG tablet Take 1 tablet (100 mg total) by mouth daily. 30 tablet 3   metFORMIN (GLUCOPHAGE) 1000 MG tablet Take 1,000 mg by mouth 2 (two) times daily with a meal.     metoprolol succinate (TOPROL-XL) 50 MG 24 hr tablet Take 50 mg by mouth daily.     montelukast (SINGULAIR) 10 MG tablet Take 10 mg by mouth at bedtime.     MOUNJARO 7.5 MG/0.5ML Pen SMARTSIG:7.5 Milligram(s) SUB-Q Once a Week     oxyCODONE (ROXICODONE) 15 MG immediate release tablet Take 15 mg by mouth 4 (four) times daily as needed.  SUMAtriptan (IMITREX) 100 MG tablet Take 100 mg by mouth as directed.     topiramate (TOPAMAX) 50 MG tablet Take 50 mg by mouth at bedtime.     acetaminophen (TYLENOL 8 HOUR) 650 MG CR tablet Take 1 tablet (650 mg total) by mouth every 8 (eight) hours. (Patient not taking: Reported on 07/19/2023) 30 tablet 0   lidocaine (LIDODERM) 5 % Place 1 patch onto the skin daily. Remove & Discard patch within 12 hours or as directed by MD (Patient not taking: Reported on 07/19/2023) 30 patch 0   naproxen (NAPROSYN) 500 MG tablet  Take 1 tablet (500 mg total) by mouth 2 (two) times daily. (Patient not taking: Reported on 07/19/2023) 30 tablet 0   No current facility-administered medications for this visit.    PHYSICAL EXAMINATION: ECOG PERFORMANCE STATUS: 1 - Symptomatic but completely ambulatory  Vitals:   07/19/23 1138  BP: (!) 147/88  Pulse: (!) 115  Resp: 18  Temp: (!) 97 F (36.1 C)  SpO2: 95%   Filed Weights   07/19/23 1138  Weight: (!) 433 lb 14.4 oz (196.8 kg)    Physical Exam   BREAST: Palpable mass in lymph node. Area of concern palpated near nipple. SKIN: Fat lobules under skin palpated.        LABORATORY DATA:  I have reviewed the data as listed    Latest Ref Rng & Units 02/01/2023    7:39 PM 12/17/2021    3:20 PM 06/29/2020   11:41 PM  CMP  Glucose 70 - 99 mg/dL 160  109  323   BUN 6 - 20 mg/dL 14  14  12    Creatinine 0.44 - 1.00 mg/dL 5.57  3.22  0.25   Sodium 135 - 145 mmol/L 135  137  138   Potassium 3.5 - 5.1 mmol/L 3.5  3.8  3.5   Chloride 98 - 111 mmol/L 102  109  103   CO2 22 - 32 mmol/L 23  20  22    Calcium 8.9 - 10.3 mg/dL 9.0  8.8  8.5   Total Protein 6.5 - 8.1 g/dL 8.5  7.8    Total Bilirubin 0.3 - 1.2 mg/dL 0.5  0.4    Alkaline Phos 38 - 126 U/L 92  96    AST 15 - 41 U/L 31  18    ALT 0 - 44 U/L 25  16      Lab Results  Component Value Date   WBC 6.0 02/01/2023   HGB 11.0 (L) 02/01/2023   HCT 37.7 02/01/2023   MCV 65.7 (L) 02/01/2023   PLT 720 (H) 02/01/2023   NEUTROABS 4.7 01/13/2023    ASSESSMENT & PLAN:  ANEMIA, IRON DEFICIENCY Most likely cause: Uterine bleeding from fibroids plus malabsorption IV iron treatment: 09/28/2012, January 2020, November 2020, August 2021, Jan 2022 (1 dose), Oct 2023 Longstanding history of iron deficiency (all her life), April 2024 (1 dose)   Blood work review 09/03/2018: Hemoglobin 9.8, MCV 61, platelet 663 02/26/2019: Iron saturation 12%, ferritin 35 improved from 9, hemoglobin 12 improved from 10.3 08/27/2019: Hemoglobin  11, MCV 66, platelets 592, iron saturation 5%, ferritin 28 04/30/2020: Hemoglobin 9.4, MCV 61.9, platelets 624 10/08/2020: Hemoglobin 10.9, MCV 67.2, iron saturation 5%, ferritin 21, platelets 598 02/10/21: Hb 12.8, ferritin 36, Iron Sat 8%, TIBC 363 07/27/2021: Hemoglobin 9 CV 63.8, RDW 17.7, platelets 517, iron studies: Ferritin 17, iron saturation 5% 08/06/2022: Hemoglobin 8.7, MCV 60.1, platelets 638, iron saturation 2%, ferritin 7 01/13/23:  Hb 9.7, MCV 65.6, Ferritin 13, Iron Sat 8%   Previously she had itching to IV iron infusions: We will need to premedicate her with Benadryl and Tylenol when she gets Iron  Breast cancer metastasized to axillary lymph node, left (HCC) 07/12/2023: Premenopausal patient palpated a lump in the left axilla. Mammogram did not reveal any primary breast tumor. 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+ yeah  Pathology and radiology counseling: Discussed with the patient, the details of pathology including the type of breast cancer,the clinical staging, the significance of ER, PR and HER-2/neu receptors and the implications for treatment. After reviewing the pathology in detail, we proceeded to discuss the different treatment options between surgery, radiation, chemotherapy, antiestrogen therapies.  Plan: Contrast-enhanced mammograms for further evaluation of the breast.  If that is negative then we will obtain a breast MRI. CT CAP and scan for staging   Port placement through her surgeons at [facility] Chemo class Echocardiogram Start chemotherapy in 2 weeks based on port placement.  Chemotherapy Counseling: I discussed the risks and benefits of chemotherapy including the risks of nausea/ vomiting, risk of infection from low WBC count, fatigue due to chemo or anemia, bruising or bleeding due to low platelets, mouth sores, loss/ change in taste and decreased appetite. Liver and kidney function will be monitored through out  chemotherapy as abnormalities in liver and kidney function may be a side effect of treatment. Cardiac dysfunction due to Adriamycin Herceptin and Perjeta were discussed in detail. Risk of permanent bone marrow dysfunction and leukemia due to chemo were also discussed.  Return to clinic in 2 weeks to start chemotherapy. ------------------------------------- Weight Management Patient currently on Mounjaro for weight loss. -Continue Mounjaro as it does not interfere with planned treatment.          No orders of the defined types were placed in this encounter.  The patient has a good understanding of the overall plan. she agrees with it. she will call with any problems that may develop before the next visit here. Total time spent: 30 mins including face to face time and time spent for planning, charting and co-ordination of care   Tamsen Meek, MD 07/19/23

## 2023-07-19 NOTE — Assessment & Plan Note (Signed)
Most likely cause: Uterine bleeding from fibroids plus malabsorption IV iron treatment: 09/28/2012, January 2020, November 2020, August 2021, Jan 2022 (1 dose), Oct 2023 Longstanding history of iron deficiency (all her life), April 2024 (1 dose)   Blood work review 09/03/2018: Hemoglobin 9.8, MCV 61, platelet 663 02/26/2019: Iron saturation 12%, ferritin 35 improved from 9, hemoglobin 12 improved from 10.3 08/27/2019: Hemoglobin 11, MCV 66, platelets 592, iron saturation 5%, ferritin 28 04/30/2020: Hemoglobin 9.4, MCV 61.9, platelets 624 10/08/2020: Hemoglobin 10.9, MCV 67.2, iron saturation 5%, ferritin 21, platelets 598 02/10/21: Hb 12.8, ferritin 36, Iron Sat 8%, TIBC 363 07/27/2021: Hemoglobin 9 CV 63.8, RDW 17.7, platelets 517, iron studies: Ferritin 17, iron saturation 5% 08/06/2022: Hemoglobin 8.7, MCV 60.1, platelets 638, iron saturation 2%, ferritin 7 01/13/23: Hb 9.7, MCV 65.6, Ferritin 13, Iron Sat 8%   Previously she had itching to IV iron infusions: We will need to premedicate her with Benadryl and Tylenol when she gets Iron

## 2023-07-19 NOTE — Assessment & Plan Note (Addendum)
07/12/2023: Premenopausal patient palpated a lump in the left axilla. Mammogram did not reveal any primary breast tumor. 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+ yeah  Pathology and radiology counseling: Discussed with the patient, the details of pathology including the type of breast cancer,the clinical staging, the significance of ER, PR and HER-2/neu receptors and the implications for treatment. After reviewing the pathology in detail, we proceeded to discuss the different treatment options between surgery, radiation, chemotherapy, antiestrogen therapies.  Plan: Contrast-enhanced mammograms for further evaluation of the breast.  If that is negative then we will obtain a breast MRI. CT CAP and scan for staging   Port placement through her surgeons at [facility] Chemo class Echocardiogram Start chemotherapy in 2 weeks based on port placement.  Chemotherapy Counseling: I discussed the risks and benefits of chemotherapy including the risks of nausea/ vomiting, risk of infection from low WBC count, fatigue due to chemo or anemia, bruising or bleeding due to low platelets, mouth sores, loss/ change in taste and decreased appetite. Liver and kidney function will be monitored through out chemotherapy as abnormalities in liver and kidney function may be a side effect of treatment. Cardiac dysfunction due to Adriamycin Herceptin and Perjeta were discussed in detail. Risk of permanent bone marrow dysfunction and leukemia due to chemo were also discussed.  Return to clinic in 2 weeks to start chemotherapy.

## 2023-07-19 NOTE — Addendum Note (Signed)
Addended by: Serena Croissant on: 07/19/2023 05:57 PM   Modules accepted: Orders

## 2023-07-20 ENCOUNTER — Other Ambulatory Visit: Payer: Self-pay

## 2023-07-23 ENCOUNTER — Other Ambulatory Visit: Payer: Self-pay

## 2023-07-25 ENCOUNTER — Encounter: Payer: Self-pay | Admitting: Hematology and Oncology

## 2023-07-26 ENCOUNTER — Encounter (HOSPITAL_COMMUNITY)
Admission: RE | Admit: 2023-07-26 | Discharge: 2023-07-26 | Disposition: A | Discharge status: Home / Self Care | Payer: Medicare HMO | Source: Ambulatory Visit | Attending: Hematology and Oncology | Admitting: Hematology and Oncology

## 2023-07-26 ENCOUNTER — Ambulatory Visit (HOSPITAL_COMMUNITY)
Admission: RE | Admit: 2023-07-26 | Discharge: 2023-07-26 | Disposition: A | Discharge status: Home / Self Care | Payer: Medicare HMO | Source: Ambulatory Visit | Attending: Hematology and Oncology | Admitting: Hematology and Oncology

## 2023-07-26 DIAGNOSIS — C773 Secondary and unspecified malignant neoplasm of axilla and upper limb lymph nodes: Secondary | ICD-10-CM | POA: Insufficient documentation

## 2023-07-26 DIAGNOSIS — C50912 Malignant neoplasm of unspecified site of left female breast: Secondary | ICD-10-CM | POA: Insufficient documentation

## 2023-07-26 LAB — POCT I-STAT CREATININE: Creatinine, Ser: 1 mg/dL (ref 0.44–1.00)

## 2023-07-26 MED ORDER — TECHNETIUM TC 99M MEDRONATE IV KIT
20.0000 | PACK | Freq: Once | INTRAVENOUS | Status: AC | PRN
  Administered 2023-07-26: 19.7 via INTRAVENOUS

## 2023-07-26 MED ORDER — SODIUM CHLORIDE (PF) 0.9 % IJ SOLN
INTRAMUSCULAR | Status: AC
  Filled 2023-07-26: qty 50

## 2023-07-26 MED ORDER — IOHEXOL 300 MG/ML  SOLN
100.0000 mL | Freq: Once | INTRAMUSCULAR | Status: AC | PRN
  Administered 2023-07-26: 100 mL via INTRAVENOUS

## 2023-07-27 ENCOUNTER — Other Ambulatory Visit: Payer: Self-pay | Admitting: *Deleted

## 2023-07-27 DIAGNOSIS — C50912 Malignant neoplasm of unspecified site of left female breast: Secondary | ICD-10-CM

## 2023-07-27 DIAGNOSIS — Z5181 Encounter for therapeutic drug level monitoring: Secondary | ICD-10-CM

## 2023-07-27 NOTE — Progress Notes (Signed)
Pharmacist Chemotherapy Monitoring - Initial Assessment    Anticipated start date: 08/03/23   The following has been reviewed per standard work regarding the patient's treatment regimen: The patient's diagnosis, treatment plan and drug doses, and organ/hematologic function Lab orders and baseline tests specific to treatment regimen  The treatment plan start date, drug sequencing, and pre-medications Prior authorization status  Patient's documented medication list, including drug-drug interaction screen and prescriptions for anti-emetics and supportive care specific to the treatment regimen The drug concentrations, fluid compatibility, administration routes, and timing of the medications to be used The patient's access for treatment and lifetime cumulative dose history, if applicable  The patient's medication allergies and previous infusion related reactions, if applicable   Changes made to treatment plan:  N/A  Follow up needed:  ECHO and prior Eveline Keto, [facility], 07/27/2023  3:13 PM

## 2023-07-28 ENCOUNTER — Ambulatory Visit (HOSPITAL_COMMUNITY): Payer: Medicare HMO

## 2023-07-28 ENCOUNTER — Encounter: Payer: Self-pay | Admitting: *Deleted

## 2023-07-29 ENCOUNTER — Other Ambulatory Visit: Payer: Self-pay

## 2023-08-01 ENCOUNTER — Inpatient Hospital Stay: Payer: Medicare HMO

## 2023-08-01 ENCOUNTER — Inpatient Hospital Stay: Payer: Medicare HMO | Admitting: Pharmacist

## 2023-08-01 DIAGNOSIS — D509 Iron deficiency anemia, unspecified: Secondary | ICD-10-CM | POA: Diagnosis not present

## 2023-08-01 DIAGNOSIS — C50912 Malignant neoplasm of unspecified site of left female breast: Secondary | ICD-10-CM

## 2023-08-01 NOTE — Progress Notes (Signed)
[facility]       Telephone: [phone]?Fax: [phone]   Oncology Clinical Pharmacist Practitioner Initial Assessment  [name] is a 47 y.o. female with a diagnosis of breast cancer. They were contacted today via in-person visit. She is accompanied by Jon Gills (dtr), Essence (dtr), Solmon Ice (friend), and Kathie Rhodes (mother).  Indication/Regimen Doxorubicin (Adriamycin) and cyclophosphamide (Cytoxan) followed by paclitaxel (Taxol) are being used appropriately for treatment of breast cancer by Dr. Serena Croissant.      Wt Readings from Last 1 Encounters:  07/19/23 (!) 433 lb 14.4 oz (196.8 kg)    Estimated body surface area is 3.1 meters squared as calculated from the following:   Height as of 07/19/23: 5\' 9"  (1.753 m).   Weight as of 07/19/23: 433 lb 14.4 oz (196.8 kg).  The dosing regimen is every 14 days for 4 cycles  Doxorubicin (60 mg/m2) on Day 1 Cyclophosphamide (600 mg/m2) on Day 1 Pegfilgrastim (6 mg) on Day 3  Followed by a dosing regimen that is every 7 days for 12 cycles  Paclitaxel (80 mg/m2) on Day 1  It is planned to continue until treatment plan completion or unacceptable toxicity. The tentative start date is: 08/15/23.  Dose Modifications Dr. Pamelia Hoit would like [name] to take dexamethasone daily for two days starting day after chemotherapy. This was reviewed with the patient   Allergies Allergies  Allergen Reactions   Doxycycline Hives   Fish Allergy Hives and Shortness Of Breath    Gets dizzy   Fish Oil Anaphylaxis   Other Hives, Anaphylaxis and Itching    Allergic to nuts. Causes mouth to itch, breaks out in hives, trouble breathing   Pollen Extract Itching   Citalopram Itching and Hives    unknown Made her pass out   Amoxicillin     REACTION: hives Has patient had a PCN reaction causing immediate rash, facial/tongue/throat swelling, SOB or lightheadedness with hypotension: Yes Has patient had a PCN reaction causing severe rash  involving mucus membranes or skin necrosis: No Has patient had a PCN reaction that required hospitalization No Has patient had a PCN reaction occurring within the last 10 years: No If all of the above answers are "NO", then may proceed with Cephalosporin use.    Penicillins Hives   Strawberry Extract     Makes mouth itch    Vitals: No vitals or labs were done today for this chemotherapy education visit   Contraindications Contraindications were reviewed? Yes Contraindications to therapy were identified? No   Safety Precautions The following safety precautions were reviewed:  Fever: reviewed the importance of having a thermometer and the Centers for Disease Control and Prevention (CDC) definition of fever which is 100.91F (38C) or higher. Patient should call 24/7 triage at [phone] if experiencing a fever or any other symptoms Decreased white blood cells (WBCs) and increased risk for infection Decreased platelet count and increased risk of bleeding Decreased hemoglobin, part of the red blood cells that carry iron and oxygen Change in the color of urine Fatigue Hair loss Nausea or vomiting Mouth irritation or sores Doxorubicin (vesicant) Cardiotoxicity Hemorrhagic cystitis Secondary cancers Muscle or joint pain or weakness Peripheral neuropathy Hypersensitivity reactions Avoid grapefruit products Intimacy, sexual activity, contraception, fertility Handling body fluids and waste  Medication Reconciliation Current Outpatient Medications  Medication Sig Dispense Refill   acetaminophen (TYLENOL 8 HOUR) 650 MG CR tablet Take 1 tablet (650 mg total) by mouth every 8 (eight) hours. (Patient not taking: Reported  on 07/19/2023) 30 tablet 0   albuterol (PROAIR HFA) 108 (90 Base) MCG/ACT inhaler Use 2 puffs every 4 hours as needed for cough or wheeze.  May use 2 puffs 10-20 minutes prior to exercise. 1 Inhaler 0   cetirizine (ZYRTEC) 10 MG tablet Take 10 mg by mouth daily.      cyclobenzaprine (FLEXERIL) 10 MG tablet Take 10 mg by mouth 3 (three) times daily.     dexamethasone (DECADRON) 4 MG tablet Take 1 tablet day after chemo and 1 tablet 2 days after chemo with Adriamycin and Cytoxan 8 tablet 0   gabapentin (NEURONTIN) 100 MG capsule Take 1 capsule (100 mg total) by mouth 3 (three) times daily.     glipiZIDE (GLUCOTROL XL) 10 MG 24 hr tablet Take 10 mg by mouth daily with breakfast.     lidocaine (LIDODERM) 5 % Place 1 patch onto the skin daily. Remove & Discard patch within 12 hours or as directed by MD (Patient not taking: Reported on 07/19/2023) 30 patch 0   lidocaine-prilocaine (EMLA) cream Apply to affected area once 30 g 3   LINZESS 72 MCG capsule Take 72 mcg by mouth daily as needed.     losartan (COZAAR) 100 MG tablet Take 1 tablet (100 mg total) by mouth daily. 30 tablet 3   metFORMIN (GLUCOPHAGE) 1000 MG tablet Take 1,000 mg by mouth 2 (two) times daily with a meal.     metoprolol succinate (TOPROL-XL) 50 MG 24 hr tablet Take 50 mg by mouth daily.     montelukast (SINGULAIR) 10 MG tablet Take 10 mg by mouth at bedtime.     MOUNJARO 7.5 MG/0.5ML Pen SMARTSIG:7.5 Milligram(s) SUB-Q Once a Week     naproxen (NAPROSYN) 500 MG tablet Take 1 tablet (500 mg total) by mouth 2 (two) times daily. (Patient not taking: Reported on 07/19/2023) 30 tablet 0   ondansetron (ZOFRAN) 8 MG tablet Take 1 tab (8 mg) by mouth every 8 hrs as needed for nausea/vomiting. Start third day after doxorubicin/cyclophosphamide chemotherapy. 30 tablet 1   oxyCODONE (ROXICODONE) 15 MG immediate release tablet Take 15 mg by mouth 4 (four) times daily as needed.     prochlorperazine (COMPAZINE) 10 MG tablet Take 1 tablet (10 mg total) by mouth every 6 (six) hours as needed for nausea or vomiting. 30 tablet 1   SUMAtriptan (IMITREX) 100 MG tablet Take 100 mg by mouth as directed.     topiramate (TOPAMAX) 50 MG tablet Take 50 mg by mouth at bedtime.     No current facility-administered  medications for this visit.   Medication reconciliation is based on the patient's most recent medication list in the electronic medical record (EMR) including herbal products and OTC medications.   The patient's medication list was reviewed today with the patient? Yes   Drug-drug interactions (DDIs) DDIs were evaluated? Yes Significant DDIs identified? No   Drug-Food Interactions Drug-food interactions were evaluated? Yes Drug-food interactions identified? No   Follow-up Plan  Treatment start date: 08/15/23 Port placement date: 08/08/23 ECHO date: 08/05/23 We reviewed the prescriptions, premedications, and treatment regimen with the patient. Possible side effects of the treatment regimen were reviewed and management strategies were discussed.  Can use loperamide as needed for diarrhea, loratadine as needed for G-CSF bone pain, and Senna-S as needed for constipation. Clinical pharmacy will assist Dr. Serena Croissant and Amelia Jo Dombkowski on an as needed basis going forward  [name] participated in the discussion, expressed understanding, and voiced agreement  with the above plan. All questions were answered to her satisfaction. The patient was advised to contact the clinic at [phone] with any questions or concerns prior to her return visit.   I spent 60 minutes assessing the patient.  Shanterria Franta A. Odetta Pink, PharmD, BCOP, CPP  Anselm Lis, RPH-CPP, 08/01/2023 3:00 PM  **Disclaimer: This note was dictated with voice recognition software. Similar sounding words can inadvertently be transcribed and this note may contain transcription errors which may not have been corrected upon publication of note.**

## 2023-08-03 ENCOUNTER — Inpatient Hospital Stay: Payer: Medicare HMO

## 2023-08-03 ENCOUNTER — Inpatient Hospital Stay: Payer: Medicare HMO | Admitting: Hematology and Oncology

## 2023-08-03 DIAGNOSIS — C50912 Malignant neoplasm of unspecified site of left female breast: Secondary | ICD-10-CM

## 2023-08-05 ENCOUNTER — Ambulatory Visit (HOSPITAL_COMMUNITY)
Admission: RE | Admit: 2023-08-05 | Discharge: 2023-08-05 | Disposition: A | Discharge status: Home / Self Care | Payer: Medicare HMO | Source: Ambulatory Visit | Attending: Hematology and Oncology | Admitting: Hematology and Oncology

## 2023-08-05 ENCOUNTER — Other Ambulatory Visit (HOSPITAL_COMMUNITY): Payer: Self-pay

## 2023-08-05 ENCOUNTER — Inpatient Hospital Stay: Payer: Medicare HMO

## 2023-08-05 DIAGNOSIS — Z79899 Other long term (current) drug therapy: Secondary | ICD-10-CM | POA: Insufficient documentation

## 2023-08-05 DIAGNOSIS — Z5181 Encounter for therapeutic drug level monitoring: Secondary | ICD-10-CM | POA: Diagnosis present

## 2023-08-05 LAB — ECHOCARDIOGRAM COMPLETE
Area-P 1/2: 2.79 cm2
S' Lateral: 3.2 cm
Single Plane A4C EF: 69 %

## 2023-08-05 MED ORDER — PERFLUTREN LIPID MICROSPHERE
1.0000 mL | INTRAVENOUS | Status: AC | PRN
  Administered 2023-08-05: 2 mL via INTRAVENOUS

## 2023-08-06 NOTE — Consult Note (Signed)
Chief Complaint: Patient was seen in consultation today for port a cath placement  Referring Physician(s): Gudena,Vinay  Supervising Physician: Simonne Come  Patient Status: [facility] - Out-pt  History of Present Illness: [name] is a 47 y.o. female with PMH sig for anemia, anxiety, asthma, HTN, DM, depression, obesity, OSA, sickle cell trait , and newly diagnosed metastatic left breast cancer. She is scheduled today for port a cath placement to assist  with treatment.   Past Medical History:  Diagnosis Date   Allergic rhinitis    Anemia    Anxiety disorder    Asthma    Benign essential hypertension    Depression    Diabetes mellitus without complication (HCC)    Edema    History of blood clots    Hyperglycemia    Lumbar radiculopathy    Metrorrhagia    Morbid obesity (HCC)    OSA (obstructive sleep apnea)    Sickle cell trait (HCC)    Snoring     Past Surgical History:  Procedure Laterality Date   CESAREAN SECTION     CHOLECYSTECTOMY     TUBAL LIGATION      Allergies: Doxycycline, Fish allergy, Fish oil, Other, Pollen extract, Citalopram, Amoxicillin, Penicillins, and Strawberry extract  Medications: Prior to Admission medications   Medication Sig Start Date End Date Taking? Authorizing Provider  acetaminophen (TYLENOL 8 HOUR) 650 MG CR tablet Take 1 tablet (650 mg total) by mouth every 8 (eight) hours. Patient not taking: Reported on 07/19/2023 05/01/21   Derwood Kaplan, MD  albuterol (PROAIR HFA) 108 (90 Base) MCG/ACT inhaler Use 2 puffs every 4 hours as needed for cough or wheeze.  May use 2 puffs 10-20 minutes prior to exercise. 02/09/16   Fletcher Anon, MD  cetirizine (ZYRTEC) 10 MG tablet Take 10 mg by mouth daily.    [provider]  cyclobenzaprine (FLEXERIL) 10 MG tablet Take 10 mg by mouth 3 (three) times daily.    [provider]  dexamethasone (DECADRON) 4 MG tablet Take 1 tablet day after chemo and 1 tablet 2 days after chemo  with Adriamycin and Cytoxan 07/19/23   Serena Croissant, MD  gabapentin (NEURONTIN) 100 MG capsule Take 1 capsule (100 mg total) by mouth 3 (three) times daily. 06/13/20   Serena Croissant, MD  glipiZIDE (GLUCOTROL XL) 10 MG 24 hr tablet Take 10 mg by mouth daily with breakfast.    [provider]  lidocaine (LIDODERM) 5 % Place 1 patch onto the skin daily. Remove & Discard patch within 12 hours or as directed by MD Patient not taking: Reported on 07/19/2023 05/01/21   Derwood Kaplan, MD  lidocaine-prilocaine (EMLA) cream Apply to affected area once 07/19/23   Serena Croissant, MD  LINZESS 72 MCG capsule Take 72 mcg by mouth daily as needed. 01/15/21   [provider]  losartan (COZAAR) 100 MG tablet Take 1 tablet (100 mg total) by mouth daily. 02/02/13   Leroy Sea, MD  metFORMIN (GLUCOPHAGE) 1000 MG tablet Take 1,000 mg by mouth 2 (two) times daily with a meal.    [provider]  metoprolol succinate (TOPROL-XL) 50 MG 24 hr tablet Take 50 mg by mouth daily. 02/16/21   [provider]  montelukast (SINGULAIR) 10 MG tablet Take 10 mg by mouth at bedtime.    [provider]  MOUNJARO 7.5 MG/0.5ML Pen SMARTSIG:7.5 Milligram(s) SUB-Q Once a Week 06/11/23   [provider]  naproxen (NAPROSYN) 500 MG tablet Take  1 tablet (500 mg total) by mouth 2 (two) times daily. Patient not taking: Reported on 07/19/2023 05/01/21   Derwood Kaplan, MD  ondansetron (ZOFRAN) 8 MG tablet Take 1 tab (8 mg) by mouth every 8 hrs as needed for nausea/vomiting. Start third day after doxorubicin/cyclophosphamide chemotherapy. 07/19/23   Serena Croissant, MD  oxyCODONE (ROXICODONE) 15 MG immediate release tablet Take 15 mg by mouth 4 (four) times daily as needed. 03/20/21   [provider]  prochlorperazine (COMPAZINE) 10 MG tablet Take 1 tablet (10 mg total) by mouth every 6 (six) hours as needed for nausea or vomiting. 07/19/23   Serena Croissant, MD  SUMAtriptan (IMITREX) 100 MG  tablet Take 100 mg by mouth as directed. 03/16/21   [provider]  topiramate (TOPAMAX) 50 MG tablet Take 50 mg by mouth at bedtime. 01/19/21   [provider]     Family History  Problem Relation Age of Onset   Asthma Mother    Hypertension Mother    Leukemia Father    Diabetes Father    Breast cancer Maternal Aunt    Breast cancer Paternal Aunt    Breast cancer Cousin    Diabetes Brother     Social History   Socioeconomic History   Marital status: Single    Spouse name: Not on file   Number of children: 3   Years of education: 16   Highest education level: Not on file  Occupational History   Not on file  Tobacco Use   Smoking status: Never   Smokeless tobacco: Never  Vaping Use   Vaping status: Never Used  Substance and Sexual Activity   Alcohol use: Not Currently   Drug use: No   Sexual activity: Yes    Partners: Male    Birth control/protection: Surgical  Other Topics Concern   Not on file  Social History Narrative   Patient is single and lives with her children.   Patient has three children.   Patient is currently unemployed.   Patient is a Holiday representative in college now.   Patient is right-handed.   Patient drinks one cup of tea every other day.   Social Determinants of Health   Financial Resource Strain: Not on file  Food Insecurity: Not on file  Transportation Needs: Not on file  Physical Activity: Not on file  Stress: Not on file  Social Connections: Unknown (06/07/2023)   Received from [facility]   Social Network    Social Network: Not on file      Review of Systems  Vital Signs: LMP 02/21/2023 (Exact Date) Comment: Pt hasnt had cycle since 01/2023  Code Status:   Physical Exam  Imaging: ECHOCARDIOGRAM COMPLETE  Result Date: 08/05/2023    ECHOCARDIOGRAM REPORT   Patient Name:   [name] Date of Exam: 08/05/2023 Medical Rec #:  [id number]    Height:       69.0 in Accession #:    [id number]   Weight:       433.9 lb Date of  Birth:  [birth year]    BSA:          2.871 m Patient Age:    47 years     BP:           147/88 mmHg Patient Gender: F            HR:           76 bpm. Exam Location:  Outpatient Procedure: 2D Echo, Cardiac Doppler,  Color Doppler and Intracardiac            Opacification Agent Indications:    Chemo  History:        Patient has no prior history of Echocardiogram examinations.  Sonographer:    Milda Smart Referring Phys: [id number] Serena Croissant  Sonographer Comments: Suboptimal subcostal window and suboptimal parasternal window. IMPRESSIONS  1. Left ventricular ejection fraction, by estimation, is 60 to 65%. The left ventricle has normal function. The left ventricle has no regional wall motion abnormalities. Left ventricular diastolic parameters were normal.  2. Right ventricular systolic function is normal. The right ventricular size is normal.  3. The mitral valve is grossly normal. Trivial mitral valve regurgitation. No evidence of mitral stenosis.  4. The aortic valve is normal in structure. Aortic valve regurgitation is not visualized. No aortic stenosis is present.  5. The inferior vena cava is normal in size with greater than 50% respiratory variability, suggesting right atrial pressure of 3 mmHg. Comparison(s): No prior Echocardiogram. FINDINGS  Left Ventricle: Left ventricular ejection fraction, by estimation, is 60 to 65%. The left ventricle has normal function. The left ventricle has no regional wall motion abnormalities. Definity contrast agent was given IV to delineate the left ventricular  endocardial borders. The left ventricular internal cavity size was normal in size. There is no left ventricular hypertrophy. Left ventricular diastolic parameters were normal. Right Ventricle: The right ventricular size is normal. No increase in right ventricular wall thickness. Right ventricular systolic function is normal. Left Atrium: Left atrial size was normal in size. Right Atrium: Right atrial size was normal in  size. Pericardium: There is no evidence of pericardial effusion. Mitral Valve: The mitral valve is grossly normal. Trivial mitral valve regurgitation. No evidence of mitral valve stenosis. Tricuspid Valve: The tricuspid valve is normal in structure. Tricuspid valve regurgitation is trivial. No evidence of tricuspid stenosis. Aortic Valve: The aortic valve is normal in structure. Aortic valve regurgitation is not visualized. No aortic stenosis is present. Pulmonic Valve: The pulmonic valve was not well visualized. Pulmonic valve regurgitation is not visualized. No evidence of pulmonic stenosis. Aorta: The aortic root and ascending aorta are structurally normal, with no evidence of dilitation. Venous: The inferior vena cava is normal in size with greater than 50% respiratory variability, suggesting right atrial pressure of 3 mmHg. IAS/Shunts: The interatrial septum was not well visualized.  LEFT VENTRICLE PLAX 2D LVIDd:         4.80 cm      Diastology LVIDs:         3.20 cm      LV e' medial:    7.94 cm/s LV PW:         0.90 cm      LV E/e' medial:  9.0 LV IVS:        0.70 cm      LV e' lateral:   13.90 cm/s LVOT diam:     2.30 cm      LV E/e' lateral: 5.1 LV SV:         91 LV SV Index:   32 LVOT Area:     4.15 cm  LV Volumes (MOD) LV vol d, MOD A4C: 108.0 ml LV vol s, MOD A4C: 33.5 ml LV SV MOD A4C:     108.0 ml RIGHT VENTRICLE RV S prime:     10.40 cm/s LEFT ATRIUM             Index  RIGHT ATRIUM           Index LA diam:        4.00 cm 1.39 cm/m   RA Area:     10.10 cm LA Vol (A2C):   53.8 ml 18.74 ml/m  RA Volume:   19.00 ml  6.62 ml/m LA Vol (A4C):   50.7 ml 17.66 ml/m LA Biplane Vol: 52.0 ml 18.11 ml/m  AORTIC VALVE LVOT Vmax:   101.00 cm/s LVOT Vmean:  74.500 cm/s LVOT VTI:    0.220 m  AORTA Ao Root diam: 2.80 cm Ao Asc diam:  3.60 cm MITRAL VALVE               TRICUSPID VALVE MV Area (PHT): 2.79 cm    TR Peak grad:   4.6 mmHg MV Decel Time: 272 msec    TR Vmax:        107.00 cm/s MV E velocity:  71.40 cm/s MV A velocity: 71.40 cm/s  SHUNTS MV E/A ratio:  1.00        Systemic VTI:  0.22 m                            Systemic Diam: 2.30 cm Sunit Tolia Electronically signed by Tessa Lerner Signature Date/Time: 08/05/2023/6:15:29 PM    Final    NM Bone Scan Whole Body  Result Date: 07/27/2023 CLINICAL DATA:  Invasive breast carcinoma.  Back pain and hip pain. EXAM: NUCLEAR MEDICINE WHOLE BODY BONE SCAN TECHNIQUE: Whole body anterior and posterior images were obtained approximately 3 hours after intravenous injection of radiopharmaceutical. Technique is somewhat limited by patient body habitus. RADIOPHARMACEUTICALS:  19.7 mCi Technetium-27m MDP IV COMPARISON:  None Available. FINDINGS: No focal accumulation of radiotracer within the axillary or appendicular skeleton to localize breast cancer skeletal metastasis. Degenerate uptake noted in the posterior aspect the L4 vertebral body. Degenerative uptake noted in the shoulder. IMPRESSION: No evidence skeletal metastasis. Electronically Signed   By: Genevive Bi M.D.   On: 07/27/2023 10:04   CT CHEST W CONTRAST  Result Date: 07/26/2023 CLINICAL DATA:  Newly diagnosed left breast cancer, staging. * Tracking Code: BO *. EXAM: CT CHEST, ABDOMEN, AND PELVIS WITH CONTRAST TECHNIQUE: Multidetector CT imaging of the chest, abdomen and pelvis was performed following the standard protocol during bolus administration of intravenous contrast. RADIATION DOSE REDUCTION: This exam was performed according to the departmental dose-optimization program which includes automated exposure control, adjustment of the mA and/or kV according to patient size and/or use of iterative reconstruction technique. CONTRAST:  OMNIPAQUE IOHEXOL 300 MG/ML  SOLN COMPARISON:  CT abdomen and pelvis February 01, 2023 FINDINGS: CT CHEST FINDINGS Cardiovascular: Normal caliber thoracic aorta. No central pulmonary embolus on this nondedicated study. Normal size heart. No significant pericardial  effusion/thickening. Mediastinum/Nodes: Enlarged left axillary lymph node measures 17 mm in short axis on image 27/2. No pathologically enlarged mediastinal, hilar or right axillary lymph nodes. The esophagus is grossly unremarkable. No suspicious thyroid nodule. Lungs/Pleura: No suspicious pulmonary nodules or masses. No pleural effusion. No pneumothorax. Musculoskeletal: No aggressive lytic or blastic lesion of bone. CT ABDOMEN PELVIS FINDINGS Hepatobiliary: No suspicious hepatic lesion. Gallbladder surgically absent. No biliary ductal dilation. Pancreas: No pancreatic ductal dilation or evidence of acute inflammation. Spleen: No splenomegaly. Adrenals/Urinary Tract: Bilateral adrenal glands appear normal. No hydronephrosis. Kidneys demonstrate symmetric enhancement. Urinary bladder is unremarkable for degree of distension. Stomach/Bowel: Radiopaque enteric contrast material traverses the ascending colon. Stomach  is unremarkable for degree of distension. No pathologic dilation of small or large bowel. No evidence of acute bowel inflammation. Vascular/Lymphatic: Normal caliber abdominal aorta. Smooth IVC contours. The portal, splenic and superior mesenteric veins are patent. No pathologically enlarged abdominal or pelvic lymph nodes. Reproductive: Uterus and bilateral adnexa are unremarkable. Other: No significant abdominopelvic free fluid. Musculoskeletal: No aggressive lytic or blastic lesion of bone. Multilevel degenerative changes spine. IMPRESSION: 1. Enlarged left axillary lymph node, compatible with nodal disease involvement. No definite mass in the left breast identified. 2. No evidence of distant metastatic disease in the chest, abdomen or pelvis. Electronically Signed   By: Maudry Mayhew M.D.   On: 07/26/2023 16:29   CT ABDOMEN PELVIS W CONTRAST  Result Date: 07/26/2023 CLINICAL DATA:  Newly diagnosed left breast cancer, staging. * Tracking Code: BO *. EXAM: CT CHEST, ABDOMEN, AND PELVIS WITH  CONTRAST TECHNIQUE: Multidetector CT imaging of the chest, abdomen and pelvis was performed following the standard protocol during bolus administration of intravenous contrast. RADIATION DOSE REDUCTION: This exam was performed according to the departmental dose-optimization program which includes automated exposure control, adjustment of the mA and/or kV according to patient size and/or use of iterative reconstruction technique. CONTRAST:  OMNIPAQUE IOHEXOL 300 MG/ML  SOLN COMPARISON:  CT abdomen and pelvis February 01, 2023 FINDINGS: CT CHEST FINDINGS Cardiovascular: Normal caliber thoracic aorta. No central pulmonary embolus on this nondedicated study. Normal size heart. No significant pericardial effusion/thickening. Mediastinum/Nodes: Enlarged left axillary lymph node measures 17 mm in short axis on image 27/2. No pathologically enlarged mediastinal, hilar or right axillary lymph nodes. The esophagus is grossly unremarkable. No suspicious thyroid nodule. Lungs/Pleura: No suspicious pulmonary nodules or masses. No pleural effusion. No pneumothorax. Musculoskeletal: No aggressive lytic or blastic lesion of bone. CT ABDOMEN PELVIS FINDINGS Hepatobiliary: No suspicious hepatic lesion. Gallbladder surgically absent. No biliary ductal dilation. Pancreas: No pancreatic ductal dilation or evidence of acute inflammation. Spleen: No splenomegaly. Adrenals/Urinary Tract: Bilateral adrenal glands appear normal. No hydronephrosis. Kidneys demonstrate symmetric enhancement. Urinary bladder is unremarkable for degree of distension. Stomach/Bowel: Radiopaque enteric contrast material traverses the ascending colon. Stomach is unremarkable for degree of distension. No pathologic dilation of small or large bowel. No evidence of acute bowel inflammation. Vascular/Lymphatic: Normal caliber abdominal aorta. Smooth IVC contours. The portal, splenic and superior mesenteric veins are patent. No pathologically enlarged abdominal or  pelvic lymph nodes. Reproductive: Uterus and bilateral adnexa are unremarkable. Other: No significant abdominopelvic free fluid. Musculoskeletal: No aggressive lytic or blastic lesion of bone. Multilevel degenerative changes spine. IMPRESSION: 1. Enlarged left axillary lymph node, compatible with nodal disease involvement. No definite mass in the left breast identified. 2. No evidence of distant metastatic disease in the chest, abdomen or pelvis. Electronically Signed   By: Maudry Mayhew M.D.   On: 07/26/2023 16:29    Labs:  CBC: Recent Labs    01/13/23 1541 02/01/23 1939  WBC 7.3 6.0  HGB 9.7* 11.0*  HCT 32.8* 37.7  PLT 448* 720*    COAGS: No results for input(s): "INR", "APTT" in the last 8760 hours.  BMP: Recent Labs    02/01/23 1939 07/26/23 1538  NA 135  --   K 3.5  --   CL 102  --   CO2 23  --   GLUCOSE 161*  --   BUN 14  --   CALCIUM 9.0  --   CREATININE 1.14* 1.00  GFRNONAA 60*  --     LIVER FUNCTION TESTS: Recent  Labs    02/01/23 1939  BILITOT 0.5  AST 31  ALT 25  ALKPHOS 92  PROT 8.5*  ALBUMIN 3.8    TUMOR MARKERS: No results for input(s): "AFPTM", "CEA", "CA199", "CHROMGRNA" in the last 8760 hours.  Assessment and Plan: 47 y.o. female with PMH sig for anemia, anxiety, asthma, HTN, DM, depression, obesity, OSA, sickle cell trait , and newly diagnosed metastatic left breast cancer. She is scheduled today for port a cath placement to assist  with treatment. Risks and benefits of image guided port-a-catheter placement was discussed with the patient including, but not limited to bleeding, infection, pneumothorax, or fibrin sheath development and need for additional procedures.  All of the patient's questions were answered, patient is agreeable to proceed. Consent signed and in chart.    Thank you for this interesting consult.  I greatly enjoyed meeting Tamala M Nabers and look forward to participating in their care.  A copy of this report was sent to the  requesting provider on this date.  Electronically Signed: D. Jeananne Rama, PA-C 08/06/2023, 6:02 PM   I spent a total of   25 minutes  in face to face in clinical consultation, greater than 50% of which was counseling/coordinating care for port a cath placement

## 2023-08-08 ENCOUNTER — Other Ambulatory Visit: Payer: Self-pay

## 2023-08-08 ENCOUNTER — Ambulatory Visit (HOSPITAL_COMMUNITY)
Admission: RE | Admit: 2023-08-08 | Discharge: 2023-08-08 | Disposition: A | Discharge status: Home / Self Care | Payer: Medicare HMO | Source: Ambulatory Visit | Attending: Hematology and Oncology | Admitting: Hematology and Oncology

## 2023-08-08 ENCOUNTER — Ambulatory Visit (HOSPITAL_COMMUNITY)
Admission: RE | Admit: 2023-08-08 | Discharge: 2023-08-08 | Disposition: A | Discharge status: Home / Self Care | Payer: Medicare HMO | Source: Ambulatory Visit | Attending: Hematology and Oncology

## 2023-08-08 ENCOUNTER — Encounter (HOSPITAL_COMMUNITY): Payer: Self-pay

## 2023-08-08 DIAGNOSIS — I10 Essential (primary) hypertension: Secondary | ICD-10-CM | POA: Diagnosis not present

## 2023-08-08 DIAGNOSIS — F419 Anxiety disorder, unspecified: Secondary | ICD-10-CM | POA: Insufficient documentation

## 2023-08-08 DIAGNOSIS — J45909 Unspecified asthma, uncomplicated: Secondary | ICD-10-CM | POA: Diagnosis not present

## 2023-08-08 DIAGNOSIS — D573 Sickle-cell trait: Secondary | ICD-10-CM | POA: Insufficient documentation

## 2023-08-08 DIAGNOSIS — G4733 Obstructive sleep apnea (adult) (pediatric): Secondary | ICD-10-CM | POA: Diagnosis not present

## 2023-08-08 DIAGNOSIS — C773 Secondary and unspecified malignant neoplasm of axilla and upper limb lymph nodes: Secondary | ICD-10-CM | POA: Insufficient documentation

## 2023-08-08 DIAGNOSIS — C50912 Malignant neoplasm of unspecified site of left female breast: Secondary | ICD-10-CM | POA: Diagnosis present

## 2023-08-08 DIAGNOSIS — D649 Anemia, unspecified: Secondary | ICD-10-CM | POA: Insufficient documentation

## 2023-08-08 DIAGNOSIS — E119 Type 2 diabetes mellitus without complications: Secondary | ICD-10-CM | POA: Diagnosis not present

## 2023-08-08 HISTORY — PX: IR IMAGING GUIDED PORT INSERTION: IMG5740

## 2023-08-08 LAB — GLUCOSE, CAPILLARY: Glucose-Capillary: 184 mg/dL — ABNORMAL HIGH (ref 70–99)

## 2023-08-08 MED ORDER — MIDAZOLAM HCL 2 MG/2ML IJ SOLN
INTRAMUSCULAR | Status: AC
  Filled 2023-08-08: qty 2

## 2023-08-08 MED ORDER — FENTANYL CITRATE (PF) 100 MCG/2ML IJ SOLN
INTRAMUSCULAR | Status: DC | PRN
  Administered 2023-08-08 (×2): 50 ug via INTRAVENOUS

## 2023-08-08 MED ORDER — LIDOCAINE-EPINEPHRINE 1 %-1:100000 IJ SOLN
20.0000 mL | Freq: Once | INTRAMUSCULAR | Status: AC
  Administered 2023-08-08: 20 mL via INTRADERMAL

## 2023-08-08 MED ORDER — HEPARIN SOD (PORK) LOCK FLUSH 100 UNIT/ML IV SOLN
500.0000 [IU] | Freq: Once | INTRAVENOUS | Status: AC
  Administered 2023-08-08: 500 [IU] via INTRAVENOUS

## 2023-08-08 MED ORDER — FENTANYL CITRATE (PF) 100 MCG/2ML IJ SOLN
INTRAMUSCULAR | Status: AC
  Filled 2023-08-08: qty 2

## 2023-08-08 MED ORDER — HEPARIN SOD (PORK) LOCK FLUSH 100 UNIT/ML IV SOLN
INTRAVENOUS | Status: AC
  Filled 2023-08-08: qty 5

## 2023-08-08 MED ORDER — MIDAZOLAM HCL 2 MG/2ML IJ SOLN
INTRAMUSCULAR | Status: DC | PRN
  Administered 2023-08-08 (×2): 1 mg via INTRAVENOUS

## 2023-08-08 MED ORDER — SODIUM CHLORIDE 0.9 % IV SOLN: INTRAVENOUS | Status: DC

## 2023-08-08 MED ORDER — LIDOCAINE-EPINEPHRINE 1 %-1:100000 IJ SOLN
INTRAMUSCULAR | Status: AC
  Filled 2023-08-08: qty 1

## 2023-08-08 NOTE — Procedures (Signed)
Pre Procedure Dx: Poor venous access Post Procedural Dx: Same  Successful placement of right IJ approach port-a-cath with tip at the superior caval atrial junction. The catheter is ready for immediate use.  Estimated Blood Loss: Trace  Complications: None immediate.  Jay Haile Bosler, MD Pager #: [phone]   

## 2023-08-08 NOTE — Progress Notes (Signed)
Pharmacist Chemotherapy Monitoring - Initial Assessment    Anticipated start date: 08/15/23   The following has been reviewed per standard work regarding the patient's treatment regimen: The patient's diagnosis, treatment plan and drug doses, and organ/hematologic function Lab orders and baseline tests specific to treatment regimen  The treatment plan start date, drug sequencing, and pre-medications Prior authorization status  Patient's documented medication list, including drug-drug interaction screen and prescriptions for anti-emetics and supportive care specific to the treatment regimen The drug concentrations, fluid compatibility, administration routes, and timing of the medications to be used The patient's access for treatment and lifetime cumulative dose history, if applicable  The patient's medication allergies and previous infusion related reactions, if applicable   Changes made to treatment plan:  N/A  Follow up needed:  Pending authorization for treatment  Port to be placed 08/08/23   [name], RPH, 08/08/2023  1:32 PM

## 2023-08-12 MED FILL — Dexamethasone Sodium Phosphate Inj 100 MG/10ML: INTRAMUSCULAR | Qty: 1 | Status: AC

## 2023-08-12 MED FILL — Fosaprepitant Dimeglumine For IV Infusion 150 MG (Base Eq): INTRAVENOUS | Qty: 5 | Status: AC

## 2023-08-13 NOTE — Progress Notes (Unsigned)
[facility] Cancer Follow [place] [address] [place] [address]   DIAGNOSIS: Cancer Staging  No matching staging information was found for the patient.   SUMMARY OF ONCOLOGIC HISTORY: Oncology History  Breast cancer metastasized to axillary lymph node, left (HCC)  07/12/2023 Initial Diagnosis   Patient palpated a lump in the left axilla.  Mammogram did not reveal any primary breast tumor.  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+   08/15/2023 -  Chemotherapy   Patient is on Treatment Plan : BREAST ADJUVANT DOSE DENSE AC q14d / PACLitaxel q7d       CURRENT THERAPY:  INTERVAL HISTORY: [name] 47 y.o. female returns for    Patient Active Problem List   Diagnosis Date Noted   Breast cancer metastasized to axillary lymph node, left (HCC) 07/19/2023   Menorrhagia 03/01/2018   Fibroids 03/01/2018   Obesity, morbid (HCC) 12/24/2013   Obesity hypoventilation syndrome (HCC) 12/24/2013   Iron deficiency anemia 09/29/2012   Pulmonary embolism (HCC) 09/26/2012   SOB (shortness of breath) 09/25/2012   Anemia 09/25/2012   PALPITATIONS 03/10/2010   DEPRESSION 12/09/2009   MENORRHAGIA 12/09/2009   Disorder of bursae and tendons in shoulder region 12/09/2009   PANIC DISORDER 07/17/2009   LUMBAR RADICULOPATHY, LEFT 07/17/2009   SLEEP APNEA, OBSTRUCTIVE 11/05/2008   PERIPHERAL EDEMA 11/05/2008   URINALYSIS, ABNORMAL 11/05/2008   Morbid obesity (HCC) 06/09/2007   HYPERTENSION, BENIGN ESSENTIAL 06/09/2007   Allergic rhinitis 06/09/2007   Asthma 06/09/2007   URTICARIA 06/09/2007   ANXIETY DISORDER, HX OF 06/09/2007   DEPRESSION, HX OF 06/09/2007   HYPERGLYCEMIA 06/02/2007   CYST, VULVA 07/25/2006   ANEMIA, IRON DEFICIENCY 12/28/2005   ECHOCARDIOGRAM, ABNORMAL 01/25/2005   Personal history of venous thrombosis and embolism 01/24/2005    is allergic to doxycycline, fish allergy, fish oil,  other, pollen extract, citalopram, amoxicillin, penicillins, and strawberry extract.  MEDICAL HISTORY: Past Medical History:  Diagnosis Date   Allergic rhinitis    Anemia    Anxiety disorder    Asthma    Benign essential hypertension    Depression    Diabetes mellitus without complication (HCC)    Edema    History of blood clots    Hyperglycemia    Lumbar radiculopathy    Metrorrhagia    Morbid obesity (HCC)    OSA (obstructive sleep apnea)    Sickle cell trait (HCC)    Snoring     SURGICAL HISTORY: Past Surgical History:  Procedure Laterality Date   CESAREAN SECTION     CHOLECYSTECTOMY     IR IMAGING GUIDED PORT INSERTION  08/08/2023   TUBAL LIGATION      SOCIAL HISTORY: Social History   Socioeconomic History   Marital status: Single    Spouse name: Not on file   Number of children: 3   Years of education: 16   Highest education level: Not on file  Occupational History   Not on file  Tobacco Use   Smoking status: Never   Smokeless tobacco: Never  Vaping Use   Vaping status: Never Used  Substance and Sexual Activity   Alcohol use: Not Currently   Drug use: No   Sexual activity: Yes    Partners: Male    Birth control/protection: Surgical  Other Topics Concern   Not on file  Social History Narrative   Patient is single and lives with  her children.   Patient has three children.   Patient is currently unemployed.   Patient is a Holiday representative in college now.   Patient is right-handed.   Patient drinks one cup of tea every other day.   Social Determinants of Health   Financial Resource Strain: Not on file  Food Insecurity: Not on file  Transportation Needs: Not on file  Physical Activity: Not on file  Stress: Not on file  Social Connections: Unknown (06/07/2023)   Received from [facility]   Social Network    Social Network: Not on file  Intimate Partner Violence: Unknown (06/07/2023)   Received from [facility]   HITS    Physically Hurt: Not on file     Insult or Talk Down To: Not on file    Threaten Physical Harm: Not on file    Scream or Curse: Not on file    FAMILY HISTORY: Family History  Problem Relation Age of Onset   Asthma Mother    Hypertension Mother    Leukemia Father    Diabetes Father    Breast cancer Maternal Aunt    Breast cancer Paternal Aunt    Breast cancer Cousin    Diabetes Brother     Review of Systems  Constitutional:  Negative for appetite change, chills, fatigue, fever and unexpected weight change.  HENT:   Negative for hearing loss, lump/mass and trouble swallowing.   Eyes:  Negative for eye problems and icterus.  Respiratory:  Negative for chest tightness, cough and shortness of breath.   Cardiovascular:  Negative for chest pain, leg swelling and palpitations.  Gastrointestinal:  Negative for abdominal distention, abdominal pain, constipation, diarrhea, nausea and vomiting.  Endocrine: Negative for hot flashes.  Genitourinary:  Negative for difficulty urinating.   Musculoskeletal:  Negative for arthralgias.  Skin:  Negative for itching and rash.  Neurological:  Negative for dizziness, extremity weakness, headaches and numbness.  Hematological:  Negative for adenopathy. Does not bruise/bleed easily.  Psychiatric/Behavioral:  Negative for depression. The patient is not nervous/anxious.       PHYSICAL EXAMINATION    There were no vitals filed for this visit.  Physical Exam Constitutional:      General: She is not in acute distress.    Appearance: Normal appearance. She is not toxic-appearing.  HENT:     Head: Normocephalic and atraumatic.     Mouth/Throat:     Mouth: Mucous membranes are moist.     Pharynx: Oropharynx is clear. No oropharyngeal exudate or posterior oropharyngeal erythema.  Eyes:     General: No scleral icterus. Cardiovascular:     Rate and Rhythm: Normal rate and regular rhythm.     Pulses: Normal pulses.     Heart sounds: Normal heart sounds.  Pulmonary:     Effort:  Pulmonary effort is normal.     Breath sounds: Normal breath sounds.  Abdominal:     General: Abdomen is flat. Bowel sounds are normal. There is no distension.     Palpations: Abdomen is soft.     Tenderness: There is no abdominal tenderness.  Musculoskeletal:        General: No swelling.     Cervical back: Neck supple.  Lymphadenopathy:     Cervical: No cervical adenopathy.  Skin:    General: Skin is warm and dry.     Findings: No rash.  Neurological:     General: No focal deficit present.     Mental Status: She is alert.  Psychiatric:  Mood and Affect: Mood normal.        Behavior: Behavior normal.     LABORATORY DATA:  CBC    Component Value Date/Time   WBC 6.0 02/01/2023 1939   RBC 5.74 (H) 02/01/2023 1939   HGB 11.0 (L) 02/01/2023 1939   HGB 9.7 (L) 01/13/2023 1541   HCT 37.7 02/01/2023 1939   PLT 720 (H) 02/01/2023 1939   PLT 448 (H) 01/13/2023 1541   MCV 65.7 (L) 02/01/2023 1939   MCH 19.2 (L) 02/01/2023 1939   MCHC 29.2 (L) 02/01/2023 1939   RDW 20.7 (H) 02/01/2023 1939   LYMPHSABS 2.1 01/13/2023 1541   MONOABS 0.4 01/13/2023 1541   EOSABS 0.1 01/13/2023 1541   BASOSABS 0.0 01/13/2023 1541    CMP     Component Value Date/Time   NA 135 02/01/2023 1939   K 3.5 02/01/2023 1939   CL 102 02/01/2023 1939   CO2 23 02/01/2023 1939   GLUCOSE 161 (H) 02/01/2023 1939   BUN 14 02/01/2023 1939   CREATININE 1.00 07/26/2023 1538   CREATININE 1.06 (H) 12/17/2021 1520   CALCIUM 9.0 02/01/2023 1939   PROT 8.5 (H) 02/01/2023 1939   ALBUMIN 3.8 02/01/2023 1939   AST 31 02/01/2023 1939   AST 18 12/17/2021 1520   ALT 25 02/01/2023 1939   ALT 16 12/17/2021 1520   ALKPHOS 92 02/01/2023 1939   BILITOT 0.5 02/01/2023 1939   BILITOT 0.4 12/17/2021 1520   GFRNONAA 60 (L) 02/01/2023 1939   GFRNONAA >60 12/17/2021 1520   GFRAA >60 06/29/2020 2341      ASSESSMENT and THERAPY PLAN:   No problem-specific Assessment & Plan notes found for this  encounter.    All questions were answered. The patient knows to call the clinic with any problems, questions or concerns. We can certainly see the patient much sooner if necessary.  Total encounter time:*** minutes*in face-to-face visit time, chart review, lab review, care coordination, order entry, and documentation of the encounter time.    Lillard Anes, NP 08/13/23 4:17 PM Medical Oncology and Hematology [facility] [address] [place], [place] [address] Tel. [phone]    Fax. [phone]  *Total Encounter Time as defined by the Centers for Medicare and Medicaid Services includes, in addition to the face-to-face time of a patient visit (documented in the note above) non-face-to-face time: obtaining and reviewing outside history, ordering and reviewing medications, tests or procedures, care coordination (communications with other health care professionals or caregivers) and documentation in the medical record.

## 2023-08-15 ENCOUNTER — Other Ambulatory Visit: Payer: Self-pay

## 2023-08-15 ENCOUNTER — Inpatient Hospital Stay: Payer: Medicare HMO | Attending: Hematology and Oncology

## 2023-08-15 ENCOUNTER — Encounter: Payer: Self-pay | Admitting: Adult Health

## 2023-08-15 ENCOUNTER — Encounter: Payer: Self-pay | Admitting: *Deleted

## 2023-08-15 ENCOUNTER — Inpatient Hospital Stay (HOSPITAL_BASED_OUTPATIENT_CLINIC_OR_DEPARTMENT_OTHER): Payer: Medicare HMO | Admitting: Adult Health

## 2023-08-15 ENCOUNTER — Inpatient Hospital Stay: Payer: Medicare HMO

## 2023-08-15 VITALS — BP 133/84 | HR 93 | Temp 97.7°F | Resp 18 | Ht 69.0 in | Wt >= 6400 oz

## 2023-08-15 VITALS — BP 117/69 | HR 90 | Temp 98.0°F | Resp 18

## 2023-08-15 DIAGNOSIS — E119 Type 2 diabetes mellitus without complications: Secondary | ICD-10-CM | POA: Insufficient documentation

## 2023-08-15 DIAGNOSIS — N920 Excessive and frequent menstruation with regular cycle: Secondary | ICD-10-CM | POA: Insufficient documentation

## 2023-08-15 DIAGNOSIS — D509 Iron deficiency anemia, unspecified: Secondary | ICD-10-CM | POA: Diagnosis not present

## 2023-08-15 DIAGNOSIS — C773 Secondary and unspecified malignant neoplasm of axilla and upper limb lymph nodes: Secondary | ICD-10-CM

## 2023-08-15 DIAGNOSIS — Z170 Estrogen receptor positive status [ER+]: Secondary | ICD-10-CM | POA: Diagnosis not present

## 2023-08-15 DIAGNOSIS — D500 Iron deficiency anemia secondary to blood loss (chronic): Secondary | ICD-10-CM

## 2023-08-15 DIAGNOSIS — D573 Sickle-cell trait: Secondary | ICD-10-CM | POA: Insufficient documentation

## 2023-08-15 DIAGNOSIS — H5710 Ocular pain, unspecified eye: Secondary | ICD-10-CM | POA: Insufficient documentation

## 2023-08-15 DIAGNOSIS — D709 Neutropenia, unspecified: Secondary | ICD-10-CM | POA: Insufficient documentation

## 2023-08-15 DIAGNOSIS — R197 Diarrhea, unspecified: Secondary | ICD-10-CM | POA: Diagnosis not present

## 2023-08-15 DIAGNOSIS — R399 Unspecified symptoms and signs involving the genitourinary system: Secondary | ICD-10-CM

## 2023-08-15 DIAGNOSIS — G4733 Obstructive sleep apnea (adult) (pediatric): Secondary | ICD-10-CM | POA: Insufficient documentation

## 2023-08-15 DIAGNOSIS — N3090 Cystitis, unspecified without hematuria: Secondary | ICD-10-CM | POA: Insufficient documentation

## 2023-08-15 DIAGNOSIS — Z7985 Long-term (current) use of injectable non-insulin antidiabetic drugs: Secondary | ICD-10-CM | POA: Insufficient documentation

## 2023-08-15 DIAGNOSIS — I10 Essential (primary) hypertension: Secondary | ICD-10-CM | POA: Diagnosis not present

## 2023-08-15 DIAGNOSIS — Z5189 Encounter for other specified aftercare: Secondary | ICD-10-CM | POA: Diagnosis not present

## 2023-08-15 DIAGNOSIS — Z86711 Personal history of pulmonary embolism: Secondary | ICD-10-CM | POA: Diagnosis not present

## 2023-08-15 DIAGNOSIS — Z79899 Other long term (current) drug therapy: Secondary | ICD-10-CM | POA: Insufficient documentation

## 2023-08-15 DIAGNOSIS — K1230 Oral mucositis (ulcerative), unspecified: Secondary | ICD-10-CM | POA: Diagnosis not present

## 2023-08-15 DIAGNOSIS — R110 Nausea: Secondary | ICD-10-CM | POA: Diagnosis not present

## 2023-08-15 DIAGNOSIS — C50912 Malignant neoplasm of unspecified site of left female breast: Secondary | ICD-10-CM

## 2023-08-15 DIAGNOSIS — E662 Morbid (severe) obesity with alveolar hypoventilation: Secondary | ICD-10-CM | POA: Diagnosis not present

## 2023-08-15 DIAGNOSIS — R070 Pain in throat: Secondary | ICD-10-CM | POA: Diagnosis not present

## 2023-08-15 DIAGNOSIS — T451X5A Adverse effect of antineoplastic and immunosuppressive drugs, initial encounter: Secondary | ICD-10-CM | POA: Insufficient documentation

## 2023-08-15 DIAGNOSIS — R634 Abnormal weight loss: Secondary | ICD-10-CM | POA: Insufficient documentation

## 2023-08-15 DIAGNOSIS — Z5111 Encounter for antineoplastic chemotherapy: Secondary | ICD-10-CM | POA: Insufficient documentation

## 2023-08-15 DIAGNOSIS — Z7984 Long term (current) use of oral hypoglycemic drugs: Secondary | ICD-10-CM | POA: Insufficient documentation

## 2023-08-15 DIAGNOSIS — Z803 Family history of malignant neoplasm of breast: Secondary | ICD-10-CM | POA: Insufficient documentation

## 2023-08-15 LAB — CMP (CANCER CENTER ONLY)
ALT: 8 U/L (ref 0–44)
AST: 11 U/L — ABNORMAL LOW (ref 15–41)
Albumin: 3.4 g/dL — ABNORMAL LOW (ref 3.5–5.0)
Alkaline Phosphatase: 88 U/L (ref 38–126)
Anion gap: 5 (ref 5–15)
BUN: 10 mg/dL (ref 6–20)
CO2: 28 mmol/L (ref 22–32)
Calcium: 8.8 mg/dL — ABNORMAL LOW (ref 8.9–10.3)
Chloride: 103 mmol/L (ref 98–111)
Creatinine: 0.96 mg/dL (ref 0.44–1.00)
GFR, Estimated: >60 mL/min (ref >60)
Glucose, Bld: 110 mg/dL — ABNORMAL HIGH (ref 70–99)
Potassium: 3.8 mmol/L (ref 3.5–5.1)
Sodium: 136 mmol/L (ref 135–145)
Total Bilirubin: 0.3 mg/dL (ref 0.3–1.2)
Total Protein: 7 g/dL (ref 6.5–8.1)

## 2023-08-15 LAB — CBC WITH DIFFERENTIAL (CANCER CENTER ONLY)
Abs Immature Granulocytes: 0.02 10*3/uL (ref 0.00–0.07)
Basophils Absolute: 0 10*3/uL (ref 0.0–0.1)
Basophils Relative: 0 %
Eosinophils Absolute: 0.1 10*3/uL (ref 0.0–0.5)
Eosinophils Relative: 1 %
HCT: 32.9 % — ABNORMAL LOW (ref 36.0–46.0)
Hemoglobin: 10.3 g/dL — ABNORMAL LOW (ref 12.0–15.0)
Immature Granulocytes: 0 %
Lymphocytes Relative: 30 %
Lymphs Abs: 2 10*3/uL (ref 0.7–4.0)
MCH: 21.6 pg — ABNORMAL LOW (ref 26.0–34.0)
MCHC: 31.3 g/dL (ref 30.0–36.0)
MCV: 69.1 fL — ABNORMAL LOW (ref 80.0–100.0)
Monocytes Absolute: 0.5 10*3/uL (ref 0.1–1.0)
Monocytes Relative: 8 %
Neutro Abs: 4.1 10*3/uL (ref 1.7–7.7)
Neutrophils Relative %: 61 %
Platelet Count: 433 10*3/uL — ABNORMAL HIGH (ref 150–400)
RBC: 4.76 MIL/uL (ref 3.87–5.11)
RDW: 17.6 % — ABNORMAL HIGH (ref 11.5–15.5)
WBC Count: 6.7 10*3/uL (ref 4.0–10.5)
nRBC: 0 % (ref 0.0–0.2)

## 2023-08-15 LAB — URINALYSIS, COMPLETE (UACMP) WITH MICROSCOPIC
Bilirubin Urine: NEGATIVE
Glucose, UA: NEGATIVE mg/dL
Hgb urine dipstick: NEGATIVE
Ketones, ur: NEGATIVE mg/dL
Leukocytes,Ua: NEGATIVE
Nitrite: NEGATIVE
Protein, ur: NEGATIVE mg/dL
Specific Gravity, Urine: 1.013 (ref 1.005–1.030)
pH: 5 (ref 5.0–8.0)

## 2023-08-15 LAB — PREGNANCY, URINE: Preg Test, Ur: NEGATIVE

## 2023-08-15 MED ORDER — SODIUM CHLORIDE 0.9 % IV SOLN
600.0000 mg/m2 | Freq: Once | INTRAVENOUS | Status: AC
  Administered 2023-08-15: 2000 mg via INTRAVENOUS
  Filled 2023-08-15: qty 100

## 2023-08-15 MED ORDER — HEPARIN SOD (PORK) LOCK FLUSH 100 UNIT/ML IV SOLN
500.0000 [IU] | Freq: Once | INTRAVENOUS | Status: AC | PRN
  Administered 2023-08-15: 500 [IU]

## 2023-08-15 MED ORDER — SODIUM CHLORIDE 0.9% FLUSH
10.0000 mL | Freq: Once | INTRAVENOUS | Status: AC | PRN
  Administered 2023-08-15: 10 mL

## 2023-08-15 MED ORDER — SODIUM CHLORIDE 0.9 % IV SOLN
150.0000 mg | Freq: Once | INTRAVENOUS | Status: AC
  Administered 2023-08-15: 150 mg via INTRAVENOUS
  Filled 2023-08-15: qty 150

## 2023-08-15 MED ORDER — DOXORUBICIN HCL CHEMO IV INJECTION 2 MG/ML
60.0000 mg/m2 | Freq: Once | INTRAVENOUS | Status: AC
  Administered 2023-08-15: 186 mg via INTRAVENOUS
  Filled 2023-08-15: qty 93

## 2023-08-15 MED ORDER — SODIUM CHLORIDE 0.9 % IV SOLN: Freq: Once | INTRAVENOUS | Status: AC

## 2023-08-15 MED ORDER — SODIUM CHLORIDE 0.9% FLUSH
10.0000 mL | INTRAVENOUS | Status: DC | PRN
  Administered 2023-08-15: 10 mL

## 2023-08-15 MED ORDER — SODIUM CHLORIDE 0.9 % IV SOLN
10.0000 mg | Freq: Once | INTRAVENOUS | Status: AC
  Administered 2023-08-15: 10 mg via INTRAVENOUS
  Filled 2023-08-15: qty 10

## 2023-08-15 MED ORDER — PALONOSETRON HCL INJECTION 0.25 MG/5ML
0.2500 mg | Freq: Once | INTRAVENOUS | Status: AC
  Administered 2023-08-15: 0.25 mg via INTRAVENOUS
  Filled 2023-08-15: qty 5

## 2023-08-15 MED ORDER — LIDOCAINE-PRILOCAINE 2.5-2.5 % EX CREA: TOPICAL_CREAM | CUTANEOUS | 3 refills | Status: DC

## 2023-08-15 NOTE — Assessment & Plan Note (Signed)
07/12/2023: Premenopausal patient palpated a lump in the left axilla. Mammogram did not reveal any primary breast tumor. 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+ yeah  Pathology and radiology counseling: Discussed with the patient, the details of pathology including the type of breast cancer,the clinical staging, the significance of ER, PR and HER-2/neu receptors and the implications for treatment. After reviewing the pathology in detail, we proceeded to discuss the different treatment options between surgery, radiation, chemotherapy, antiestrogen therapies.  Plan: Contrast-enhanced mammograms for further evaluation of the breast.  If that is negative then we will obtain a breast MRI. CT CAP and scan for staging   Port placement through her surgeons at [facility] Chem[facility] class Echocardiogram Start chemotherapy in 2 weeks based on port placement.  ------------------------------------------------------------------------------------------------------------------------------------------------------------------------------------------------------------------------------------------------------------  Contrast-enhanced mammogram has not yet occurred.  I reached out to her breast cancer navigators about this. CT chest abdomen pelvis on September 24 shows no evidence of metastatic disease. Echocardiogram on August 05, 2023 demonstrates a left ventricular ejection fraction of 60 to 65%.  Current treatment: Adriamycin, Cytoxan cycle 1  Urinary frequency: Will obtain urinalysis and urine culture.  I asked my CMA, Deritra to see if this could be added onto her urine pregnancy test. Port-A-Cath: I sent in Emla cream to the [facility] [place] list at her request. Her labs today are stable and she will proceed with treatment so long as her c-Met is within parameters.  RTC in 2 days for injection and in 2 weeks for labs, f/u, and cycle 2 of therapy.

## 2023-08-15 NOTE — Patient Instructions (Signed)
[place] [facility]  Discharge Instructions: Thank you for choosing [facility] to provide your oncology and hematology care.   If you have a lab appointment with the Cancer Center, please go directly to the Cancer Center and check in at the registration area.   Wear comfortable clothing and clothing appropriate for easy access to any Portacath or PICC line.   We strive to give you quality time with your provider. You may need to reschedule your appointment if you arrive late (15 or more minutes).  Arriving late affects you and other patients whose appointments are after yours.  Also, if you miss three or more appointments without notifying the office, you may be dismissed from the clinic at the provider's discretion.      For prescription refill requests, have your pharmacy contact our office and allow 72 hours for refills to be completed.    Today you received the following chemotherapy and/or immunotherapy agents Cytoxan, Adriamycin      To help prevent nausea and vomiting after your treatment, we encourage you to take your nausea medication as directed.  BELOW ARE SYMPTOMS THAT SHOULD BE REPORTED IMMEDIATELY: *FEVER GREATER THAN 100.4 F (38 C) OR HIGHER *CHILLS OR SWEATING *NAUSEA AND VOMITING THAT IS NOT CONTROLLED WITH YOUR NAUSEA MEDICATION *UNUSUAL SHORTNESS OF BREATH *UNUSUAL BRUISING OR BLEEDING *URINARY PROBLEMS (pain or burning when urinating, or frequent urination) *BOWEL PROBLEMS (unusual diarrhea, constipation, pain near the anus) TENDERNESS IN MOUTH AND THROAT WITH OR WITHOUT PRESENCE OF ULCERS (sore throat, sores in mouth, or a toothache) UNUSUAL RASH, SWELLING OR PAIN  UNUSUAL VAGINAL DISCHARGE OR ITCHING   Items with * indicate a potential emergency and should be followed up as soon as possible or go to the Emergency Department if any problems should occur.  Please show the CHEMOTHERAPY ALERT CARD or IMMUNOTHERAPY ALERT CARD  at check-in to the Emergency Department and triage nurse.  Should you have questions after your visit or need to cancel or reschedule your appointment, please contact [place] [facility]  Dept: [phone]  and follow the prompts.  Office hours are 8:00 a.m. to 4:30 p.m. Monday - Friday. Please note that voicemails left after 4:00 p.m. may not be returned until the following business day.  We are closed weekends and major holidays. You have access to a nurse at all times for urgent questions. Please call the main number to the clinic Dept: [phone] and follow the prompts.   For any non-urgent questions, you may also contact your provider using MyChart. We now offer e-Visits for anyone 35 and older to request care online for non-urgent symptoms. For details visit [email or web address].   Also download the MyChart app! Go to the app store, search "MyChart", open the app, select [place], and log in with your MyChart username and password.  Cyclophosphamide Injection What is this medication? CYCLOPHOSPHAMIDE (sye kloe FOSS fa mide) treats some types of cancer. It works by slowing down the growth of cancer cells. This medicine may be used for other purposes; ask your health care provider or pharmacist if you have questions. COMMON BRAND NAME(S): Cyclophosphamide, Cytoxan, Neosar What should I tell my care team before I take this medication? They need to know if you have any of these conditions: Heart disease Irregular heartbeat or rhythm Infection Kidney problems Liver disease Low blood cell levels (white cells, platelets, or red blood cells) Lung disease Previous radiation Trouble passing urine An  unusual or allergic reaction to cyclophosphamide, other medications, foods, dyes, or preservatives Pregnant or trying to get pregnant Breast-feeding How should I use this medication? This medication is injected into a vein. It is given by your care team in a  hospital or clinic setting. Talk to your care team about the use of this medication in children. Special care may be needed. Overdosage: If you think you have taken too much of this medicine contact a poison control center or emergency room at once. NOTE: This medicine is only for you. Do not share this medicine with others. What if I miss a dose? Keep appointments for follow-up doses. It is important not to miss your dose. Call your care team if you are unable to keep an appointment. What may interact with this medication? Amphotericin B Amiodarone Azathioprine Certain antivirals for HIV or hepatitis Certain medications for blood pressure, such as enalapril, lisinopril, quinapril Cyclosporine Diuretics Etanercept Indomethacin Medications that relax muscles Metronidazole Natalizumab Tamoxifen Warfarin This list may not describe all possible interactions. Give your health care provider a list of all the medicines, herbs, non-prescription drugs, or dietary supplements you use. Also tell them if you smoke, drink alcohol, or use illegal drugs. Some items may interact with your medicine. What should I watch for while using this medication? This medication may make you feel generally unwell. This is not uncommon as chemotherapy can affect healthy cells as well as cancer cells. Report any side effects. Continue your course of treatment even though you feel ill unless your care team tells you to stop. You may need blood work while you are taking this medication. This medication may increase your risk of getting an infection. Call your care team for advice if you get a fever, chills, sore throat, or other symptoms of a cold or flu. Do not treat yourself. Try to avoid being around people who are sick. Avoid taking medications that contain aspirin, acetaminophen, ibuprofen, naproxen, or ketoprofen unless instructed by your care team. These medications may hide a fever. Be careful brushing or flossing  your teeth or using a toothpick because you may get an infection or bleed more easily. If you have any dental work done, tell your dentist you are receiving this medication. Drink water or other fluids as directed. Urinate often, even at night. Some products may contain alcohol. Ask your care team if this medication contains alcohol. Be sure to tell all care teams you are taking this medicine. Certain medicines, like metronidazole and disulfiram, can cause an unpleasant reaction when taken with alcohol. The reaction includes flushing, headache, nausea, vomiting, sweating, and increased thirst. The reaction can last from 30 minutes to several hours. Talk to your care team if you wish to become pregnant or think you might be pregnant. This medication can cause serious birth defects if taken during pregnancy and for 1 year after the last dose. A negative pregnancy test is required before starting this medication. A reliable form of contraception is recommended while taking this medication and for 1 year after the last dose. Talk to your care team about reliable forms of contraception. Do not father a child while taking this medication and for 4 months after the last dose. Use a condom during this time period. Do not breast-feed while taking this medication or for 1 week after the last dose. This medication may cause infertility. Talk to your care team if you are concerned about your fertility. Talk to your care team about your risk of cancer.  You may be more at risk for certain types of cancer if you take this medication. What side effects may I notice from receiving this medication? Side effects that you should report to your care team as soon as possible: Allergic reactions--skin rash, itching, hives, swelling of the face, lips, tongue, or throat Dry cough, shortness of breath or trouble breathing Heart failure--shortness of breath, swelling of the ankles, feet, or hands, sudden weight gain, unusual  weakness or fatigue Heart muscle inflammation--unusual weakness or fatigue, shortness of breath, chest pain, fast or irregular heartbeat, dizziness, swelling of the ankles, feet, or hands Heart rhythm changes--fast or irregular heartbeat, dizziness, feeling faint or lightheaded, chest pain, trouble breathing Infection--fever, chills, cough, sore throat, wounds that don't heal, pain or trouble when passing urine, general feeling of discomfort or being unwell Kidney injury--decrease in the amount of urine, swelling of the ankles, hands, or feet Liver injury--right upper belly pain, loss of appetite, nausea, light-colored stool, dark yellow or brown urine, yellowing skin or eyes, unusual weakness or fatigue Low red blood cell level--unusual weakness or fatigue, dizziness, headache, trouble breathing Low sodium level--muscle weakness, fatigue, dizziness, headache, confusion Red or dark brown urine Unusual bruising or bleeding Side effects that usually do not require medical attention (report to your care team if they continue or are bothersome): Hair loss Irregular menstrual cycles or spotting Loss of appetite Nausea Pain, redness, or swelling with sores inside the mouth or throat Vomiting This list may not describe all possible side effects. Call your doctor for medical advice about side effects. You may report side effects to FDA at 1-800-FDA-1088. Where should I keep my medication? This medication is given in a hospital or clinic. It will not be stored at home. NOTE: This sheet is a summary. It may not cover all possible information. If you have questions about this medicine, talk to your doctor, pharmacist, or health care provider.  2024 Elsevier/Gold Standard (2022-03-05 00:00:00)  Doxorubicin Injection What is this medication? DOXORUBICIN (dox oh ROO bi sin) treats some types of cancer. It works by slowing down the growth of cancer cells. This medicine may be used for other purposes; ask  your health care provider or pharmacist if you have questions. COMMON BRAND NAME(S): Adriamycin, Adriamycin PFS, Adriamycin RDF, Rubex What should I tell my care team before I take this medication? They need to know if you have any of these conditions: Heart disease History of low blood cell levels caused by a medication Liver disease Recent or ongoing radiation An unusual or allergic reaction to doxorubicin, other medications, foods, dyes, or preservatives If you or your partner are pregnant or trying to get pregnant Breast-feeding How should I use this medication? This medication is injected into a vein. It is given by your care team in a hospital or clinic setting. Talk to your care team about the use of this medication in children. Special care may be needed. Overdosage: If you think you have taken too much of this medicine contact a poison control center or emergency room at once. NOTE: This medicine is only for you. Do not share this medicine with others. What if I miss a dose? Keep appointments for follow-up doses. It is important not to miss your dose. Call your care team if you are unable to keep an appointment. What may interact with this medication? 6-mercaptopurine Paclitaxel Phenytoin St. John's wort Trastuzumab Verapamil This list may not describe all possible interactions. Give your health care provider a list of all  the medicines, herbs, non-prescription drugs, or dietary supplements you use. Also tell them if you smoke, drink alcohol, or use illegal drugs. Some items may interact with your medicine. What should I watch for while using this medication? Your condition will be monitored carefully while you are receiving this medication. You may need blood work while taking this medication. This medication may make you feel generally unwell. This is not uncommon as chemotherapy can affect healthy cells as well as cancer cells. Report any side effects. Continue your course of  treatment even though you feel ill unless your care team tells you to stop. There is a maximum amount of this medication you should receive throughout your life. The amount depends on the medical condition being treated and your overall health. Your care team will watch how much of this medication you receive. Tell your care team if you have taken this medication before. Your urine may turn red for a few days after your dose. This is not blood. If your urine is dark or brown, call your care team. In some cases, you may be given additional medications to help with side effects. Follow all directions for their use. This medication may increase your risk of getting an infection. Call your care team for advice if you get a fever, chills, sore throat, or other symptoms of a cold or flu. Do not treat yourself. Try to avoid being around people who are sick. This medication may increase your risk to bruise or bleed. Call your care team if you notice any unusual bleeding. Talk to your care team about your risk of cancer. You may be more at risk for certain types of cancers if you take this medication. Talk to your care team if you or your partner may be pregnant. Serious birth defects can occur if you take this medication during pregnancy and for 6 months after the last dose. Contraception is recommended while taking this medication and for 6 months after the last dose. Your care team can help you find the option that works for you. If your partner can get pregnant, use a condom while taking this medication and for 6 months after the last dose. Do not breastfeed while taking this medication. This medication may cause infertility. Talk to your care team if you are concerned about your fertility. What side effects may I notice from receiving this medication? Side effects that you should report to your care team as soon as possible: Allergic reactions--skin rash, itching, hives, swelling of the face, lips, tongue,  or throat Heart failure--shortness of breath, swelling of the ankles, feet, or hands, sudden weight gain, unusual weakness or fatigue Heart rhythm changes--fast or irregular heartbeat, dizziness, feeling faint or lightheaded, chest pain, trouble breathing Infection--fever, chills, cough, sore throat, wounds that don't heal, pain or trouble when passing urine, general feeling of discomfort or being unwell Low red blood cell level--unusual weakness or fatigue, dizziness, headache, trouble breathing Painful swelling, warmth, or redness of the skin, blisters or sores at the infusion site Unusual bruising or bleeding Side effects that usually do not require medical attention (report to your care team if they continue or are bothersome): Diarrhea Hair loss Nausea Pain, redness, or swelling with sores inside the mouth or throat Red urine This list may not describe all possible side effects. Call your doctor for medical advice about side effects. You may report side effects to FDA at 1-800-FDA-1088. Where should I keep my medication? This medication is given in a hospital  or clinic. It will not be stored at home. NOTE: This sheet is a summary. It may not cover all possible information. If you have questions about this medicine, talk to your doctor, pharmacist, or health care provider.  2024 Elsevier/Gold Standard (2023-01-20 00:00:00)

## 2023-08-16 ENCOUNTER — Telehealth: Payer: Self-pay

## 2023-08-16 ENCOUNTER — Ambulatory Visit: Payer: Medicare HMO

## 2023-08-16 ENCOUNTER — Other Ambulatory Visit: Payer: Medicare HMO

## 2023-08-16 ENCOUNTER — Ambulatory Visit: Payer: Medicare HMO | Admitting: Adult Health

## 2023-08-16 LAB — URINE CULTURE

## 2023-08-16 NOTE — Telephone Encounter (Signed)
Called Pt to relay a message per Np that urine was normal. Pt states that her symptoms was the same. She says her symptoms was having chest pain and shaking from the treatment.

## 2023-08-16 NOTE — Telephone Encounter (Signed)
Called pt to give urinalysis results. Pt verbalized and understanding.

## 2023-08-16 NOTE — Telephone Encounter (Signed)
-----   Message from Noreene Filbert sent at 08/16/2023  1:47 PM EDT ----- Urine doesn't show infection.  How are her symptoms? ----- Message ----- From: Leory Plowman, Lab In [place] Sent: 08/15/2023   2:52 PM EDT To: Loa Socks, NP

## 2023-08-16 NOTE — Telephone Encounter (Signed)
-----   Message from Noreene Filbert sent at 08/15/2023  4:23 PM EDT ----- Please let patient know that initial urinalysis report is negative.  We will await urine culture results. ----- Message ----- From: Leory Plowman, Lab In [place] Sent: 08/15/2023   2:52 PM EDT To: Loa Socks, NP

## 2023-08-17 ENCOUNTER — Inpatient Hospital Stay: Payer: Medicare HMO

## 2023-08-17 DIAGNOSIS — Z5111 Encounter for antineoplastic chemotherapy: Secondary | ICD-10-CM | POA: Diagnosis not present

## 2023-08-17 DIAGNOSIS — C50912 Malignant neoplasm of unspecified site of left female breast: Secondary | ICD-10-CM

## 2023-08-17 MED ORDER — PEGFILGRASTIM-CBQV 6 MG/0.6ML ~~LOC~~ SOSY
6.0000 mg | PREFILLED_SYRINGE | Freq: Once | SUBCUTANEOUS | Status: AC
  Administered 2023-08-17: 6 mg via SUBCUTANEOUS
  Filled 2023-08-17: qty 0.6

## 2023-08-18 ENCOUNTER — Ambulatory Visit: Payer: Medicare HMO

## 2023-08-22 ENCOUNTER — Encounter: Payer: Self-pay | Admitting: Hematology and Oncology

## 2023-08-23 ENCOUNTER — Encounter: Payer: Self-pay | Admitting: Hematology and Oncology

## 2023-08-23 ENCOUNTER — Inpatient Hospital Stay (HOSPITAL_BASED_OUTPATIENT_CLINIC_OR_DEPARTMENT_OTHER): Payer: Medicare HMO | Admitting: Physician Assistant

## 2023-08-23 ENCOUNTER — Telehealth: Payer: Self-pay | Admitting: Physician Assistant

## 2023-08-23 ENCOUNTER — Other Ambulatory Visit (HOSPITAL_COMMUNITY): Payer: Self-pay

## 2023-08-23 ENCOUNTER — Inpatient Hospital Stay: Payer: Medicare HMO

## 2023-08-23 ENCOUNTER — Other Ambulatory Visit: Payer: Self-pay | Admitting: Physician Assistant

## 2023-08-23 ENCOUNTER — Other Ambulatory Visit: Payer: Self-pay | Admitting: *Deleted

## 2023-08-23 VITALS — BP 117/78 | HR 80 | Temp 97.6°F | Resp 18

## 2023-08-23 DIAGNOSIS — C50912 Malignant neoplasm of unspecified site of left female breast: Secondary | ICD-10-CM

## 2023-08-23 DIAGNOSIS — R110 Nausea: Secondary | ICD-10-CM

## 2023-08-23 DIAGNOSIS — R197 Diarrhea, unspecified: Secondary | ICD-10-CM

## 2023-08-23 DIAGNOSIS — D500 Iron deficiency anemia secondary to blood loss (chronic): Secondary | ICD-10-CM

## 2023-08-23 DIAGNOSIS — R300 Dysuria: Secondary | ICD-10-CM | POA: Diagnosis not present

## 2023-08-23 DIAGNOSIS — D701 Agranulocytosis secondary to cancer chemotherapy: Secondary | ICD-10-CM

## 2023-08-23 DIAGNOSIS — Z5111 Encounter for antineoplastic chemotherapy: Secondary | ICD-10-CM | POA: Diagnosis not present

## 2023-08-23 DIAGNOSIS — T451X5A Adverse effect of antineoplastic and immunosuppressive drugs, initial encounter: Secondary | ICD-10-CM

## 2023-08-23 DIAGNOSIS — C773 Secondary and unspecified malignant neoplasm of axilla and upper limb lymph nodes: Secondary | ICD-10-CM

## 2023-08-23 DIAGNOSIS — K1230 Oral mucositis (ulcerative), unspecified: Secondary | ICD-10-CM

## 2023-08-23 DIAGNOSIS — D509 Iron deficiency anemia, unspecified: Secondary | ICD-10-CM

## 2023-08-23 LAB — URINALYSIS, COMPLETE (UACMP) WITH MICROSCOPIC
Bilirubin Urine: NEGATIVE
Glucose, UA: NEGATIVE mg/dL
Ketones, ur: NEGATIVE mg/dL
Leukocytes,Ua: NEGATIVE
Nitrite: NEGATIVE
Protein, ur: NEGATIVE mg/dL
Specific Gravity, Urine: 1.014 (ref 1.005–1.030)
pH: 5 (ref 5.0–8.0)

## 2023-08-23 LAB — CBC WITH DIFFERENTIAL (CANCER CENTER ONLY)
Abs Immature Granulocytes: 0.03 10*3/uL (ref 0.00–0.07)
Basophils Absolute: 0 10*3/uL (ref 0.0–0.1)
Basophils Relative: 1 %
Eosinophils Absolute: 0.1 10*3/uL (ref 0.0–0.5)
Eosinophils Relative: 2 %
HCT: 35.9 % — ABNORMAL LOW (ref 36.0–46.0)
Hemoglobin: 11.2 g/dL — ABNORMAL LOW (ref 12.0–15.0)
Immature Granulocytes: 1 %
Lymphocytes Relative: 49 %
Lymphs Abs: 1.4 10*3/uL (ref 0.7–4.0)
MCH: 21.4 pg — ABNORMAL LOW (ref 26.0–34.0)
MCHC: 31.2 g/dL (ref 30.0–36.0)
MCV: 68.6 fL — ABNORMAL LOW (ref 80.0–100.0)
Monocytes Absolute: 0.1 10*3/uL (ref 0.1–1.0)
Monocytes Relative: 4 %
Neutro Abs: 1.2 10*3/uL — ABNORMAL LOW (ref 1.7–7.7)
Neutrophils Relative %: 43 %
Platelet Count: 195 10*3/uL (ref 150–400)
RBC: 5.23 MIL/uL — ABNORMAL HIGH (ref 3.87–5.11)
RDW: 17.2 % — ABNORMAL HIGH (ref 11.5–15.5)
WBC Count: 2.9 10*3/uL — ABNORMAL LOW (ref 4.0–10.5)
nRBC: 0 % (ref 0.0–0.2)

## 2023-08-23 LAB — CMP (CANCER CENTER ONLY)
ALT: 9 U/L (ref 0–44)
AST: 10 U/L — ABNORMAL LOW (ref 15–41)
Albumin: 3.6 g/dL (ref 3.5–5.0)
Alkaline Phosphatase: 123 U/L (ref 38–126)
Anion gap: 7 (ref 5–15)
BUN: 11 mg/dL (ref 6–20)
CO2: 26 mmol/L (ref 22–32)
Calcium: 9.1 mg/dL (ref 8.9–10.3)
Chloride: 103 mmol/L (ref 98–111)
Creatinine: 0.86 mg/dL (ref 0.44–1.00)
GFR, Estimated: >60 mL/min (ref >60)
Glucose, Bld: 261 mg/dL — ABNORMAL HIGH (ref 70–99)
Potassium: 3.6 mmol/L (ref 3.5–5.1)
Sodium: 136 mmol/L (ref 135–145)
Total Bilirubin: 0.4 mg/dL (ref 0.3–1.2)
Total Protein: 7.3 g/dL (ref 6.5–8.1)

## 2023-08-23 LAB — MAGNESIUM: Magnesium: 1.4 mg/dL — ABNORMAL LOW (ref 1.7–2.4)

## 2023-08-23 MED ORDER — SODIUM CHLORIDE 0.9 % IV SOLN
INTRAVENOUS | Status: DC
Start: 2023-08-23 — End: 2023-08-23

## 2023-08-23 MED ORDER — PHENAZOPYRIDINE HCL 200 MG PO TABS
200.0000 mg | ORAL_TABLET | Freq: Three times a day (TID) | ORAL | 0 refills | Status: DC | PRN
  Filled 2023-08-23: qty 9, 3d supply, fill #0

## 2023-08-23 MED ORDER — LIDOCAINE VISCOUS HCL 2 % MT SOLN
5.0000 mL | Freq: Four times a day (QID) | OROMUCOSAL | 1 refills | Status: DC | PRN
  Filled 2023-08-23: qty 280, 14d supply, fill #0

## 2023-08-23 MED ORDER — SODIUM CHLORIDE 0.9% FLUSH
10.0000 mL | Freq: Once | INTRAVENOUS | Status: AC | PRN
  Administered 2023-08-23: 10 mL

## 2023-08-23 MED ORDER — FAMOTIDINE IN NACL 20-0.9 MG/50ML-% IV SOLN
20.0000 mg | Freq: Once | INTRAVENOUS | Status: AC
  Administered 2023-08-23: 20 mg via INTRAVENOUS
  Filled 2023-08-23: qty 50

## 2023-08-23 MED ORDER — HEPARIN SOD (PORK) LOCK FLUSH 100 UNIT/ML IV SOLN
500.0000 [IU] | Freq: Once | INTRAVENOUS | Status: AC | PRN
  Administered 2023-08-23: 500 [IU]

## 2023-08-23 MED ORDER — ONDANSETRON HCL 4 MG/2ML IJ SOLN
8.0000 mg | Freq: Once | INTRAMUSCULAR | Status: AC
  Administered 2023-08-23: 8 mg via INTRAVENOUS
  Filled 2023-08-23: qty 4

## 2023-08-23 MED ORDER — MAGNESIUM SULFATE 2 GM/50ML IV SOLN
2.0000 g | Freq: Once | INTRAVENOUS | Status: AC
  Administered 2023-08-23: 2 g via INTRAVENOUS
  Filled 2023-08-23: qty 50

## 2023-08-23 NOTE — Progress Notes (Signed)
Symptom Management Consult Note [facility]    Patient Care Team: [name], MD as PCP - General (Internal Medicine) Serena Croissant, MD as Medical Oncologist (Hematology and Oncology) Pershing Proud, RN as Oncology Nurse Navigator Donnelly Angelica, RN as Oncology Nurse Navigator    Name / MRN / DOBZaniyla [name]  [id number]  1976-01-31   Date of visit: 08/23/2023   Chief Complaint/Reason for visit: nausea and diarrhea   Current Therapy: Adriamycin and cytoxan with Udenyca  Last treatment:  Day 1   Cycle 1 on 08/15/23 and G-CSF on 08/17/23   ASSESSMENT & PLAN: Patient is a 47 y.o. 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 Pamelia Hoit.  I have viewed most recent oncology note and lab work.     #Left breast cancer metastasized to axillary lymph node - Next appointment with oncologist is 08/29/23 -Patient noted reduction in size of the lump since starting chemotherapy.  #Diarrhea -Chemotherapy induced. -Diarrhea better controlled with Imodium. Grade 1 -Continue Imodium as directed. If diarrhea persists (>3 episodes/day), notify the clinic for possible prescription of Lomotil. -Labs today show magnesium is 1.4, suspect this is from diarrhea. Patient received 2g IV magnesium during today's visit.  #Nausea -Chemotherapy-induced. Persistent nausea despite Zofran use. -Continue Zofran as needed and discussed how to add in Compazine if nausea persists.  #Mucositis -Chemotherapy-induced. Grade 1 -Prescribe Magic Mouthwash for symptomatic relief.  #Cystitis -UA is negative for infection -Dysuria and vaginal discomfort. Will prescribe Pyridium for symptomatic relief.  #Neutropenia -Chemotherapy-induced. WBC is 2.9 and ANC 1.2. No fevers. -Continue Udenyca injections to stimulate white blood cell production. -Discussed neutropenic precautions ,      Strict ED precautions discussed should symptoms  worsen.   Heme/Onc History: Oncology History  Breast cancer metastasized to axillary lymph node, left (HCC)  07/12/2023 Initial Diagnosis   Patient palpated a lump in the left axilla.  Mammogram did not reveal any primary breast tumor.  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+   08/15/2023 -  Chemotherapy   Patient is on Treatment Plan : BREAST ADJUVANT DOSE DENSE AC q14d / PACLitaxel q7d         Interval history-: [name] is a 47 y.o. female with oncologic history as above presenting to [facility] today with chief complaint of nausea and diarrhea. She is accompanied by sister who provides additional history.  Discussed the use of AI scribe software for clinical note transcription with the patient, who gave verbal consent to proceed.  Concurrent symptoms include oral sores, throat pain, and fatigue. . The patient also reports nausea, managed with medication starting with a 'D', possibly Decadron as well as Zofran and Compazine. Last dose of Zofran was at 4AM this morning.  The patient has been experiencing diarrhea for about a week, with frequency dependent on food intake. Prior to starting Imodium, the patient reported six to eight bowel movements per day, which has since reduced to about three per day. The stool is described as completely liquid, with a brown color. She also reports dysuria although denies frequency.  The patient has lost significant weight, about 17 pounds in two weeks, and has been struggling to eat due to oral and throat pain. The patient has been managing with broth and soft foods like watermelon but reports that swallowing is painful.  Patient denies fever with maximum temperature being 31F .  The patient has been managing the various symptoms with a combination of over-the-counter medications, including Pedialyte and Imodium,  The patient has noticed a reduction in the size of the cancerous lump since starting chemotherapy.  However, the patient is concerned about the side effects of the treatment and the potential need for dose adjustments or changes.       ROS  All other systems are reviewed and are negative for acute change except as noted in the HPI.    Allergies  Allergen Reactions   Doxycycline Hives   Fish Allergy Hives and Shortness Of Breath    Gets dizzy   Fish Oil Anaphylaxis   Other Hives, Anaphylaxis and Itching    Allergic to nuts. Causes mouth to itch, breaks out in hives, trouble breathing   Pollen Extract Itching   Citalopram Itching and Hives    unknown Made her pass out   Amoxicillin     REACTION: hives Has patient had a PCN reaction causing immediate rash, facial/tongue/throat swelling, SOB or lightheadedness with hypotension: Yes Has patient had a PCN reaction causing severe rash involving mucus membranes or skin necrosis: No Has patient had a PCN reaction that required hospitalization No Has patient had a PCN reaction occurring within the last 10 years: No If all of the above answers are "NO", then may proceed with Cephalosporin use.    Penicillins Hives   Strawberry Extract     Makes mouth itch     Past Medical History:  Diagnosis Date   Allergic rhinitis    Anemia    Anxiety disorder    Asthma    Benign essential hypertension    Depression    Diabetes mellitus without complication (HCC)    Edema    History of blood clots    Hyperglycemia    Lumbar radiculopathy    Metrorrhagia    Morbid obesity (HCC)    OSA (obstructive sleep apnea)    Sickle cell trait (HCC)    Snoring      Past Surgical History:  Procedure Laterality Date   CESAREAN SECTION     CHOLECYSTECTOMY     IR IMAGING GUIDED PORT INSERTION  08/08/2023   TUBAL LIGATION      Social History   Socioeconomic History   Marital status: Single    Spouse name: Not on file   Number of children: 3   Years of education: 16   Highest education level: Not on file  Occupational History   Not on  file  Tobacco Use   Smoking status: Never   Smokeless tobacco: Never  Vaping Use   Vaping status: Never Used  Substance and Sexual Activity   Alcohol use: Not Currently   Drug use: No   Sexual activity: Yes    Partners: Male    Birth control/protection: Surgical  Other Topics Concern   Not on file  Social History Narrative   Patient is single and lives with her children.   Patient has three children.   Patient is currently unemployed.   Patient is a Holiday representative in college now.   Patient is right-handed.   Patient drinks one cup of tea every other day.   Social Determinants of Health   Financial Resource Strain: Not on file  Food Insecurity: Not on file  Transportation Needs: Not on file  Physical Activity: Not on file  Stress: Not on file  Social Connections: Unknown (06/07/2023)   Received from [facility]   Social Network  Social Network: Not on file  Intimate Partner Violence: Unknown (06/07/2023)   Received from [facility]   HITS    Physically Hurt: Not on file    Insult or Talk Down To: Not on file    Threaten Physical Harm: Not on file    Scream or Curse: Not on file    Family History  Problem Relation Age of Onset   Asthma Mother    Hypertension Mother    Leukemia Father    Diabetes Father    Breast cancer Maternal Aunt    Breast cancer Paternal Aunt    Breast cancer Cousin    Diabetes Brother      Current Outpatient Medications:    acetaminophen (TYLENOL 8 HOUR) 650 MG CR tablet, Take 1 tablet (650 mg total) by mouth every 8 (eight) hours., Disp: 30 tablet, Rfl: 0   albuterol (PROAIR HFA) 108 (90 Base) MCG/ACT inhaler, Use 2 puffs every 4 hours as needed for cough or wheeze.  May use 2 puffs 10-20 minutes prior to exercise., Disp: 1 Inhaler, Rfl: 0   cetirizine (ZYRTEC) 10 MG tablet, Take 10 mg by mouth daily., Disp: , Rfl:    cyclobenzaprine (FLEXERIL) 10 MG tablet, Take 10 mg by mouth 3 (three) times daily., Disp: , Rfl:    dexamethasone (DECADRON)  4 MG tablet, Take 1 tablet day after chemo and 1 tablet 2 days after chemo with Adriamycin and Cytoxan, Disp: 8 tablet, Rfl: 0   gabapentin (NEURONTIN) 100 MG capsule, Take 1 capsule (100 mg total) by mouth 3 (three) times daily., Disp: , Rfl:    glipiZIDE (GLUCOTROL XL) 10 MG 24 hr tablet, Take 10 mg by mouth daily with breakfast., Disp: , Rfl:    ibuprofen (ADVIL) 200 MG tablet, Take 800 mg by mouth every 6 (six) hours as needed., Disp: , Rfl:    lidocaine (LIDODERM) 5 %, Place 1 patch onto the skin daily. Remove & Discard patch within 12 hours or as directed by MD, Disp: 30 patch, Rfl: 0   lidocaine-prilocaine (EMLA) cream, Apply to affected area once, Disp: 30 g, Rfl: 3   LINZESS 72 MCG capsule, Take 72 mcg by mouth daily as needed., Disp: , Rfl:    losartan (COZAAR) 100 MG tablet, Take 1 tablet (100 mg total) by mouth daily., Disp: 30 tablet, Rfl: 3   magic mouthwash (lidocaine, diphenhydrAMINE, alum & mag hydroxide) suspension, Swish and swallow 5 mLs four times daily as needed for mouth pain., Disp: 360 mL, Rfl: 1   metFORMIN (GLUCOPHAGE) 1000 MG tablet, Take 1,000 mg by mouth 2 (two) times daily with a meal., Disp: , Rfl:    metoprolol succinate (TOPROL-XL) 50 MG 24 hr tablet, Take 50 mg by mouth daily., Disp: , Rfl:    montelukast (SINGULAIR) 10 MG tablet, Take 10 mg by mouth at bedtime., Disp: , Rfl:    MOUNJARO 7.5 MG/0.5ML Pen, SMARTSIG:7.5 Milligram(s) SUB-Q Once a Week, Disp: , Rfl:    naproxen (NAPROSYN) 500 MG tablet, Take 1 tablet (500 mg total) by mouth 2 (two) times daily., Disp: 30 tablet, Rfl: 0   oxyCODONE (ROXICODONE) 15 MG immediate release tablet, Take 15 mg by mouth every 4 (four) hours as needed for pain., Disp: , Rfl:    phenazopyridine (PYRIDIUM) 200 MG tablet, Take 1 tablet (200 mg total) by mouth 3 (three) times daily as needed for pain., Disp: 9 tablet, Rfl: 0   prochlorperazine (COMPAZINE) 10 MG tablet, Take 1 tablet (10 mg total)  by mouth every 6 (six) hours as  needed for nausea or vomiting., Disp: 30 tablet, Rfl: 1   SUMAtriptan (IMITREX) 100 MG tablet, Take 100 mg by mouth as directed., Disp: , Rfl:    topiramate (TOPAMAX) 50 MG tablet, Take 50 mg by mouth at bedtime., Disp: , Rfl:  No current facility-administered medications for this visit.  Facility-Administered Medications Ordered in Other Visits:    0.9 %  sodium chloride infusion, , Intravenous, Continuous, Kandice Hams, Last Rate: 999 mL/hr at 08/23/23 1448, New Bag at 08/23/23 1448   magnesium sulfate IVPB 2 g 50 mL, 2 g, Intravenous, Once, Shanon Ace, PA-C, Last Rate: 50 mL/hr at 08/23/23 1518, 2 g at 08/23/23 1518  PHYSICAL EXAM: ECOG FS:1 - Symptomatic but completely ambulatory    Vitals:   08/23/23 1313  BP: 137/86  Pulse: 85  Resp: 19  Temp: (!) 97.3 F (36.3 C)  TempSrc: Temporal  SpO2: 100%  Weight: (!) 417 lb 8 oz (189.4 kg)  Height: 5\' 9"  (1.753 m)   Physical Exam Vitals and nursing note reviewed.  Constitutional:      Appearance: She is obese. She is not ill-appearing or toxic-appearing.  HENT:     Head: Normocephalic.     Right Ear: Tympanic membrane and external ear normal.     Left Ear: Tympanic membrane and external ear normal.     Mouth/Throat:     Mouth: Mucous membranes are dry.  Eyes:     Conjunctiva/sclera: Conjunctivae normal.  Cardiovascular:     Rate and Rhythm: Normal rate and regular rhythm.     Pulses: Normal pulses.     Heart sounds: Normal heart sounds.  Pulmonary:     Effort: Pulmonary effort is normal.     Breath sounds: Normal breath sounds.  Abdominal:     General: There is no distension.     Palpations: Abdomen is soft.     Tenderness: There is no abdominal tenderness. There is no guarding or rebound.  Musculoskeletal:     Cervical back: Normal range of motion.  Skin:    General: Skin is warm and dry.  Neurological:     Mental Status: She is alert.        LABORATORY DATA: I have reviewed the  data as listed    Latest Ref Rng & Units 08/23/2023   12:28 PM 08/15/2023   11:31 AM 02/01/2023    7:39 PM  CBC  WBC 4.0 - 10.5 K/uL 2.9  6.7  6.0   Hemoglobin 12.0 - 15.0 g/dL 57.8  46.9  62.9   Hematocrit 36.0 - 46.0 % 35.9  32.9  37.7   Platelets 150 - 400 K/uL 195  433  720         Latest Ref Rng & Units 08/23/2023   12:28 PM 08/15/2023   11:31 AM 07/26/2023    3:38 PM  CMP  Glucose 70 - 99 mg/dL 528  413    BUN 6 - 20 mg/dL 11  10    Creatinine 2.44 - 1.00 mg/dL 0.10  2.72  5.36   Sodium 135 - 145 mmol/L 136  136    Potassium 3.5 - 5.1 mmol/L 3.6  3.8    Chloride 98 - 111 mmol/L 103  103    CO2 22 - 32 mmol/L 26  28    Calcium 8.9 - 10.3 mg/dL 9.1  8.8    Total Protein 6.5 - 8.1 g/dL 7.3  7.0  Total Bilirubin 0.3 - 1.2 mg/dL 0.4  0.3    Alkaline Phos 38 - 126 U/L 123  88    AST 15 - 41 U/L 10  11    ALT 0 - 44 U/L 9  8         RADIOGRAPHIC STUDIES (from last 24 hours if applicable) I have personally reviewed the radiological images as listed and agreed with the findings in the report. No results found.      Visit Diagnosis: 1. Hypomagnesemia   2. Nausea without vomiting   3. Breast cancer metastasized to axillary lymph node, left (HCC)   4. Dysuria   5. Chemotherapy-induced neutropenia (HCC)   6. Diarrhea, unspecified type   7. Mucositis      No orders of the defined types were placed in this encounter.   All questions were answered. The patient knows to call the clinic with any problems, questions or concerns. No barriers to learning was detected.  A total of more than 30 minutes were spent on this encounter with face-to-face time and non-face-to-face time, including preparing to see the patient, ordering tests and/or medications, counseling the patient and coordination of care as outlined above.    Thank you for allowing me to participate in the care of this patient.    Shanon Ace, PA-C Department of Hematology/Oncology [facility] Phone: [phone]  [phone]    08/23/2023 3:43 PM

## 2023-08-23 NOTE — Telephone Encounter (Signed)
Per Domingo Sep, P.A, called and spoke with pt to make  aware of appt times for portflush/labs and [facility]. Pt verbalized understanding.

## 2023-08-23 NOTE — Patient Instructions (Signed)
Imodium directions: The usual dose is 4 mg (2 tablets) after the first loose bowel movement, and 2 mg (1 tablet) after each loose bowel movement after the first dose has been taken. No more than 8 mg (4 tablets) should be taken in any 24-hour period

## 2023-08-23 NOTE — Patient Instructions (Signed)
Magnesium Sulfate Injection What is this medication? MAGNESIUM SULFATE (mag NEE zee um SUL fate) prevents and treats low levels of magnesium in your body. It may also be used to prevent and treat seizures during pregnancy in people with high blood pressure disorders, such as preeclampsia or eclampsia. Magnesium plays an important role in maintaining the health of your muscles and nervous system. This medicine may be used for other purposes; ask your health care provider or pharmacist if you have questions. What should I tell my care team before I take this medication? They need to know if you have any of these conditions: Heart disease History of irregular heart beat Kidney disease An unusual or allergic reaction to magnesium sulfate, medications, foods, dyes, or preservatives Pregnant or trying to get pregnant Breast-feeding How should I use this medication? This medication is for infusion into a vein. It is given in a hospital or clinic setting. Talk to your care team about the use of this medication in children. While this medication may be prescribed for selected conditions, precautions do apply. Overdosage: If you think you have taken too much of this medicine contact a poison control center or emergency room at once. NOTE: This medicine is only for you. Do not share this medicine with others. What if I miss a dose? This does not apply. What may interact with this medication? Certain medications for anxiety or sleep Certain medications for seizures, such phenobarbital Digoxin Medications that relax muscles for surgery Narcotic medications for pain This list may not describe all possible interactions. Give your health care provider a list of all the medicines, herbs, non-prescription drugs, or dietary supplements you use. Also tell them if you smoke, drink alcohol, or use illegal drugs. Some items may interact with your medicine. What should I watch for while using this  medication? Your condition will be monitored carefully while you are receiving this medication. You may need blood work done while you are receiving this medication. What side effects may I notice from receiving this medication? Side effects that you should report to your care team as soon as possible: Allergic reactions--skin rash, itching, hives, swelling of the face, lips, tongue, or throat High magnesium level--confusion, drowsiness, facial flushing, redness, sweating, muscle weakness, fast or irregular heartbeat, trouble breathing Low blood pressure--dizziness, feeling faint or lightheaded, blurry vision Side effects that usually do not require medical attention (report to your care team if they continue or are bothersome): Headache Nausea This list may not describe all possible side effects. Call your doctor for medical advice about side effects. You may report side effects to FDA at 1-800-FDA-1088. Where should I keep my medication? This medication is given in a hospital or clinic and will not be stored at home. NOTE: This sheet is a summary. It may not cover all possible information. If you have questions about this medicine, talk to your doctor, pharmacist, or health care provider.  2024 Elsevier/Gold Standard (2021-07-01 00:00:00)  

## 2023-08-24 ENCOUNTER — Other Ambulatory Visit (HOSPITAL_COMMUNITY): Payer: Self-pay

## 2023-08-25 ENCOUNTER — Telehealth: Payer: Self-pay | Admitting: *Deleted

## 2023-08-25 NOTE — Telephone Encounter (Signed)
Received call from pt with stating bilateral eyes are red, swollen, sore, throbbing, and sensitive to light. Pt also states she woke up this morning with them crusted over.  MD out of office, [facility] visit offered to pt and appt scheduled.

## 2023-08-26 ENCOUNTER — Other Ambulatory Visit: Payer: Self-pay

## 2023-08-26 ENCOUNTER — Inpatient Hospital Stay (HOSPITAL_BASED_OUTPATIENT_CLINIC_OR_DEPARTMENT_OTHER): Payer: Medicare HMO | Admitting: Physician Assistant

## 2023-08-26 VITALS — BP 140/93 | HR 98 | Temp 98.0°F | Resp 18 | Wt >= 6400 oz

## 2023-08-26 DIAGNOSIS — C50912 Malignant neoplasm of unspecified site of left female breast: Secondary | ICD-10-CM

## 2023-08-26 DIAGNOSIS — H1013 Acute atopic conjunctivitis, bilateral: Secondary | ICD-10-CM | POA: Diagnosis not present

## 2023-08-26 DIAGNOSIS — R197 Diarrhea, unspecified: Secondary | ICD-10-CM

## 2023-08-26 DIAGNOSIS — C773 Secondary and unspecified malignant neoplasm of axilla and upper limb lymph nodes: Secondary | ICD-10-CM

## 2023-08-26 DIAGNOSIS — R300 Dysuria: Secondary | ICD-10-CM

## 2023-08-26 DIAGNOSIS — Z5111 Encounter for antineoplastic chemotherapy: Secondary | ICD-10-CM | POA: Diagnosis not present

## 2023-08-26 MED FILL — Fosaprepitant Dimeglumine For IV Infusion 150 MG (Base Eq): INTRAVENOUS | Qty: 5 | Status: AC

## 2023-08-26 NOTE — Progress Notes (Signed)
Symptom Management Consult Note [facility]    Patient Care Team: [name], MD as PCP - General (Internal Medicine) Serena Croissant, MD as Medical Oncologist (Hematology and Oncology) Pershing Proud, RN as Oncology Nurse Navigator Donnelly Angelica, RN as Oncology Nurse Navigator    Name / MRN / DOBEryca [name]  [id number]  Dec 27, 1975   Date of visit: 08/26/2023   Chief Complaint/Reason for visit: eye swelling   Current Therapy: Adriamycin and cytoxan with Udenyca   Last treatment: Day 1   Cycle 1 on 08/15/23 and G-CSF on 08/17/23    ASSESSMENT & PLAN: Patient is a 47 y.o. 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 Pamelia Hoit.  I have viewed most recent oncology note and lab work.    #Left breast cancer metastasized to axillary lymph node  - Next treatment and appointment with oncologist is 08/29/23.   #Eye pain -Sudden onset of eye pain, light sensitivity, tearing, and blurry vision after receiving IV magnesium. Symptoms not suggestive of anaphylaxis. Possible reaction to IV magnesium based on time of symptom onset. Symptoms improved prior to visit today. Recommend patient continue warm compresses, Claritin, and start over-the-counter Pataday eye drops. -Discussed with patient symptoms and PE no suggestive of bacterial conjunctivitis, so will hold off on antibiotics at this time. Patient agreeable with plan.  #Diarrhea -Resolved since last visit after taking Imodium. -Discussed how to take Imodium if diarrhea returns after next treatment and importance of electrolyte replacement with Pedialyte/Body Armor/Gatorlyte.  #Dysuria -Continue pyridium as prescribed. It is reassuring she has had multiple UAs negative or infection. Suspect external irritation and how to treat symptoms OTC.  Strict ED precautions discussed should symptoms worsen.   Heme/Onc History: Oncology History  Breast cancer  metastasized to axillary lymph node, left (HCC)  07/12/2023 Initial Diagnosis   Patient palpated a lump in the left axilla.  Mammogram did not reveal any primary breast tumor.  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+   08/15/2023 -  Chemotherapy   Patient is on Treatment Plan : BREAST ADJUVANT DOSE DENSE AC q14d / PACLitaxel q7d         Interval history- Discussed the use of AI scribe software for clinical note transcription with the patient, who gave verbal consent to proceed.   [name] is a 47 y.o. female with oncologic history as above presenting to [facility] today with chief complaint of eye pain. She presents unaccompanied to clinic today.  Patient reports after receiving IV magnesium in clinic x 2 days ago she experienced eye pain, light sensitivity, and tearing. The ocular discomfort was associated with swelling above and below the eyes, particularly worse on the right side, and clear drainage. The patient also noted blurry vision, which they attributed to light sensitivity. The patient reported that the ocular symptoms have improved with warm compresses. She does not wear glasses or contacts. She denies any congestion. No one in the home has similar symptoms.  The patient reports the diarrhea we discussed during her last visit has resolved with Imodium. She is still experiencing pain with urination although the pyridium is helping. She had an appointment with pain specialist and her urine was negative for infection again she reports. Denies fevers.    ROS  All other systems are reviewed and are negative for acute change except as noted in the HPI.  Allergies  Allergen Reactions   Doxycycline Hives   Fish Allergy Hives and Shortness Of Breath    Gets dizzy   Fish Oil Anaphylaxis   Other Hives, Anaphylaxis and Itching    Allergic to nuts. Causes mouth to itch, breaks out in hives, trouble breathing   Pollen Extract Itching    Citalopram Itching and Hives    unknown Made her pass out   Amoxicillin     REACTION: hives Has patient had a PCN reaction causing immediate rash, facial/tongue/throat swelling, SOB or lightheadedness with hypotension: Yes Has patient had a PCN reaction causing severe rash involving mucus membranes or skin necrosis: No Has patient had a PCN reaction that required hospitalization No Has patient had a PCN reaction occurring within the last 10 years: No If all of the above answers are "NO", then may proceed with Cephalosporin use.    Penicillins Hives   Strawberry Extract     Makes mouth itch     Past Medical History:  Diagnosis Date   Allergic rhinitis    Anemia    Anxiety disorder    Asthma    Benign essential hypertension    Depression    Diabetes mellitus without complication (HCC)    Edema    History of blood clots    Hyperglycemia    Lumbar radiculopathy    Metrorrhagia    Morbid obesity (HCC)    OSA (obstructive sleep apnea)    Sickle cell trait (HCC)    Snoring      Past Surgical History:  Procedure Laterality Date   CESAREAN SECTION     CHOLECYSTECTOMY     IR IMAGING GUIDED PORT INSERTION  08/08/2023   TUBAL LIGATION      Social History   Socioeconomic History   Marital status: Single    Spouse name: Not on file   Number of children: 3   Years of education: 16   Highest education level: Not on file  Occupational History   Not on file  Tobacco Use   Smoking status: Never   Smokeless tobacco: Never  Vaping Use   Vaping status: Never Used  Substance and Sexual Activity   Alcohol use: Not Currently   Drug use: No   Sexual activity: Yes    Partners: Male    Birth control/protection: Surgical  Other Topics Concern   Not on file  Social History Narrative   Patient is single and lives with her children.   Patient has three children.   Patient is currently unemployed.   Patient is a Holiday representative in college now.   Patient is right-handed.   Patient  drinks one cup of tea every other day.   Social Determinants of Health   Financial Resource Strain: Not on file  Food Insecurity: Not on file  Transportation Needs: Not on file  Physical Activity: Not on file  Stress: Not on file  Social Connections: Unknown (06/07/2023)   Received from [facility]   Social Network    Social Network: Not on file  Intimate Partner Violence: Unknown (06/07/2023)   Received from [facility]   HITS    Physically Hurt: Not on file    Insult or Talk Down To: Not on file    Threaten Physical Harm: Not on file    Scream or Curse: Not on file    Family History  Problem Relation Age of Onset   Asthma Mother    Hypertension Mother    Leukemia Father  Diabetes Father    Breast cancer Maternal Aunt    Breast cancer Paternal Aunt    Breast cancer Cousin    Diabetes Brother      Current Outpatient Medications:    acetaminophen (TYLENOL 8 HOUR) 650 MG CR tablet, Take 1 tablet (650 mg total) by mouth every 8 (eight) hours., Disp: 30 tablet, Rfl: 0   albuterol (PROAIR HFA) 108 (90 Base) MCG/ACT inhaler, Use 2 puffs every 4 hours as needed for cough or wheeze.  May use 2 puffs 10-20 minutes prior to exercise., Disp: 1 Inhaler, Rfl: 0   cetirizine (ZYRTEC) 10 MG tablet, Take 10 mg by mouth daily., Disp: , Rfl:    cyclobenzaprine (FLEXERIL) 10 MG tablet, Take 10 mg by mouth 3 (three) times daily., Disp: , Rfl:    dexamethasone (DECADRON) 4 MG tablet, Take 1 tablet day after chemo and 1 tablet 2 days after chemo with Adriamycin and Cytoxan, Disp: 8 tablet, Rfl: 0   gabapentin (NEURONTIN) 100 MG capsule, Take 1 capsule (100 mg total) by mouth 3 (three) times daily., Disp: , Rfl:    glipiZIDE (GLUCOTROL XL) 10 MG 24 hr tablet, Take 10 mg by mouth daily with breakfast., Disp: , Rfl:    ibuprofen (ADVIL) 200 MG tablet, Take 800 mg by mouth every 6 (six) hours as needed., Disp: , Rfl:    lidocaine (LIDODERM) 5 %, Place 1 patch onto the skin daily. Remove & Discard  patch within 12 hours or as directed by MD, Disp: 30 patch, Rfl: 0   lidocaine-prilocaine (EMLA) cream, Apply to affected area once, Disp: 30 g, Rfl: 3   LINZESS 72 MCG capsule, Take 72 mcg by mouth daily as needed., Disp: , Rfl:    losartan (COZAAR) 100 MG tablet, Take 1 tablet (100 mg total) by mouth daily., Disp: 30 tablet, Rfl: 3   magic mouthwash (lidocaine, diphenhydrAMINE, alum & mag hydroxide) suspension, Swish and swallow 5 mLs four times daily as needed for mouth pain., Disp: 360 mL, Rfl: 1   metFORMIN (GLUCOPHAGE) 1000 MG tablet, Take 1,000 mg by mouth 2 (two) times daily with a meal., Disp: , Rfl:    metoprolol succinate (TOPROL-XL) 50 MG 24 hr tablet, Take 50 mg by mouth daily., Disp: , Rfl:    montelukast (SINGULAIR) 10 MG tablet, Take 10 mg by mouth at bedtime., Disp: , Rfl:    MOUNJARO 7.5 MG/0.5ML Pen, SMARTSIG:7.5 Milligram(s) SUB-Q Once a Week, Disp: , Rfl:    naproxen (NAPROSYN) 500 MG tablet, Take 1 tablet (500 mg total) by mouth 2 (two) times daily., Disp: 30 tablet, Rfl: 0   oxyCODONE (ROXICODONE) 15 MG immediate release tablet, Take 15 mg by mouth every 4 (four) hours as needed for pain., Disp: , Rfl:    phenazopyridine (PYRIDIUM) 200 MG tablet, Take 1 tablet (200 mg total) by mouth 3 (three) times daily as needed for pain., Disp: 9 tablet, Rfl: 0   prochlorperazine (COMPAZINE) 10 MG tablet, Take 1 tablet (10 mg total) by mouth every 6 (six) hours as needed for nausea or vomiting., Disp: 30 tablet, Rfl: 1   SUMAtriptan (IMITREX) 100 MG tablet, Take 100 mg by mouth as directed., Disp: , Rfl:    topiramate (TOPAMAX) 50 MG tablet, Take 50 mg by mouth at bedtime., Disp: , Rfl:   PHYSICAL EXAM: ECOG FS:1 - Symptomatic but completely ambulatory    Vitals:   08/26/23 0935  BP: (!) 140/93  Pulse: 98  Resp: 18  Temp: 98 F (  36.7 C)  SpO2: 98%  Weight: (!) 423 lb 11.2 oz (192.2 kg)   Physical Exam Vitals and nursing note reviewed.  Constitutional:      Appearance: She  is not ill-appearing or toxic-appearing.  HENT:     Head: Normocephalic.     Nose: Nose normal.  Eyes:     General: Lids are normal. Vision grossly intact. Gaze aligned appropriately.        Right eye: No discharge.        Left eye: No discharge.     Extraocular Movements: Extraocular movements intact.     Conjunctiva/sclera:     Right eye: Right conjunctiva is injected. No chemosis or exudate.    Left eye: Left conjunctiva is injected. No chemosis or exudate.    Comments: No periorbital edema  Cardiovascular:     Rate and Rhythm: Normal rate.     Pulses: Normal pulses.  Pulmonary:     Effort: Pulmonary effort is normal.  Abdominal:     General: There is no distension.  Musculoskeletal:     Cervical back: Normal range of motion.  Skin:    General: Skin is warm and dry.  Neurological:     Mental Status: She is alert.        LABORATORY DATA: I have reviewed the data as listed    Latest Ref Rng & Units 08/23/2023   12:28 PM 08/15/2023   11:31 AM 02/01/2023    7:39 PM  CBC  WBC 4.0 - 10.5 K/uL 2.9  6.7  6.0   Hemoglobin 12.0 - 15.0 g/dL 96.2  95.2  84.1   Hematocrit 36.0 - 46.0 % 35.9  32.9  37.7   Platelets 150 - 400 K/uL 195  433  720         Latest Ref Rng & Units 08/23/2023   12:28 PM 08/15/2023   11:31 AM 07/26/2023    3:38 PM  CMP  Glucose 70 - 99 mg/dL 324  401    BUN 6 - 20 mg/dL 11  10    Creatinine 0.27 - 1.00 mg/dL 2.53  6.64  4.03   Sodium 135 - 145 mmol/L 136  136    Potassium 3.5 - 5.1 mmol/L 3.6  3.8    Chloride 98 - 111 mmol/L 103  103    CO2 22 - 32 mmol/L 26  28    Calcium 8.9 - 10.3 mg/dL 9.1  8.8    Total Protein 6.5 - 8.1 g/dL 7.3  7.0    Total Bilirubin 0.3 - 1.2 mg/dL 0.4  0.3    Alkaline Phos 38 - 126 U/L 123  88    AST 15 - 41 U/L 10  11    ALT 0 - 44 U/L 9  8         RADIOGRAPHIC STUDIES (from last 24 hours if applicable) I have personally reviewed the radiological images as listed and agreed with the findings in the  report. No results found.      Visit Diagnosis: 1. Dysuria   2. Diarrhea, unspecified type   3. Breast cancer metastasized to axillary lymph node, left (HCC)   4. Allergic conjunctivitis of both eyes      No orders of the defined types were placed in this encounter.   All questions were answered. The patient knows to call the clinic with any problems, questions or concerns. No barriers to learning was detected.  A total of more than 20 minutes were  spent on this encounter with face-to-face time and non-face-to-face time, including preparing to see the patient,  counseling the patient and coordination of care as outlined above.    Thank you for allowing me to participate in the care of this patient.    Shanon Ace, PA-C Department of Hematology/Oncology [facility] Phone: [phone]  [phone]    08/26/2023 11:18 AM

## 2023-08-27 LAB — URINE CULTURE: Culture: 20000 — AB

## 2023-08-29 ENCOUNTER — Inpatient Hospital Stay: Payer: Medicare HMO

## 2023-08-29 ENCOUNTER — Inpatient Hospital Stay (HOSPITAL_BASED_OUTPATIENT_CLINIC_OR_DEPARTMENT_OTHER): Payer: Medicare HMO | Admitting: Hematology and Oncology

## 2023-08-29 VITALS — BP 153/84 | HR 102 | Temp 98.1°F | Resp 19 | Ht 69.0 in | Wt >= 6400 oz

## 2023-08-29 VITALS — HR 99

## 2023-08-29 DIAGNOSIS — C50912 Malignant neoplasm of unspecified site of left female breast: Secondary | ICD-10-CM | POA: Diagnosis not present

## 2023-08-29 DIAGNOSIS — C773 Secondary and unspecified malignant neoplasm of axilla and upper limb lymph nodes: Secondary | ICD-10-CM

## 2023-08-29 DIAGNOSIS — Z5111 Encounter for antineoplastic chemotherapy: Secondary | ICD-10-CM | POA: Diagnosis not present

## 2023-08-29 DIAGNOSIS — D500 Iron deficiency anemia secondary to blood loss (chronic): Secondary | ICD-10-CM

## 2023-08-29 LAB — COMPREHENSIVE METABOLIC PANEL
ALT: 13 U/L (ref 0–44)
AST: 14 U/L — ABNORMAL LOW (ref 15–41)
Albumin: 3.4 g/dL — ABNORMAL LOW (ref 3.5–5.0)
Alkaline Phosphatase: 95 U/L (ref 38–126)
Anion gap: 10 (ref 5–15)
BUN: 13 mg/dL (ref 6–20)
CO2: 26 mmol/L (ref 22–32)
Calcium: 8.7 mg/dL — ABNORMAL LOW (ref 8.9–10.3)
Chloride: 104 mmol/L (ref 98–111)
Creatinine, Ser: 0.92 mg/dL (ref 0.44–1.00)
GFR, Estimated: >60 mL/min (ref >60)
Glucose, Bld: 203 mg/dL — ABNORMAL HIGH (ref 70–99)
Potassium: 3.2 mmol/L — ABNORMAL LOW (ref 3.5–5.1)
Sodium: 140 mmol/L (ref 135–145)
Total Bilirubin: 0.2 mg/dL — ABNORMAL LOW (ref 0.3–1.2)
Total Protein: 6.6 g/dL (ref 6.5–8.1)

## 2023-08-29 LAB — CBC WITH DIFFERENTIAL (CANCER CENTER ONLY)
Abs Immature Granulocytes: 1.95 10*3/uL — ABNORMAL HIGH (ref 0.00–0.07)
Basophils Absolute: 0 10*3/uL (ref 0.0–0.1)
Basophils Relative: 0 %
Eosinophils Absolute: 0 10*3/uL (ref 0.0–0.5)
Eosinophils Relative: 0 %
HCT: 33.5 % — ABNORMAL LOW (ref 36.0–46.0)
Hemoglobin: 10.5 g/dL — ABNORMAL LOW (ref 12.0–15.0)
Immature Granulocytes: 17 %
Lymphocytes Relative: 20 %
Lymphs Abs: 2.3 10*3/uL (ref 0.7–4.0)
MCH: 21.9 pg — ABNORMAL LOW (ref 26.0–34.0)
MCHC: 31.3 g/dL (ref 30.0–36.0)
MCV: 69.8 fL — ABNORMAL LOW (ref 80.0–100.0)
Monocytes Absolute: 0.7 10*3/uL (ref 0.1–1.0)
Monocytes Relative: 6 %
Neutro Abs: 6.4 10*3/uL (ref 1.7–7.7)
Neutrophils Relative %: 57 %
Platelet Count: 340 10*3/uL (ref 150–400)
RBC: 4.8 MIL/uL (ref 3.87–5.11)
RDW: 19.3 % — ABNORMAL HIGH (ref 11.5–15.5)
Smear Review: NORMAL
WBC Count: 11.3 10*3/uL — ABNORMAL HIGH (ref 4.0–10.5)
nRBC: 1.3 % — ABNORMAL HIGH (ref 0.0–0.2)

## 2023-08-29 LAB — PREGNANCY, URINE: Preg Test, Ur: NEGATIVE

## 2023-08-29 MED ORDER — SODIUM CHLORIDE 0.9 % IV SOLN: Freq: Once | INTRAVENOUS | Status: AC

## 2023-08-29 MED ORDER — NITROFURANTOIN MONOHYD MACRO 100 MG PO CAPS: 100.0000 mg | ORAL_CAPSULE | Freq: Two times a day (BID) | ORAL | 0 refills | Status: AC

## 2023-08-29 MED ORDER — SODIUM CHLORIDE 0.9 % IV SOLN
500.0000 mg/m2 | Freq: Once | INTRAVENOUS | Status: AC
  Administered 2023-08-29: 1500 mg via INTRAVENOUS
  Filled 2023-08-29: qty 75

## 2023-08-29 MED ORDER — SODIUM CHLORIDE 0.9% FLUSH
10.0000 mL | INTRAVENOUS | Status: DC | PRN
  Administered 2023-08-29: 10 mL

## 2023-08-29 MED ORDER — HEPARIN SOD (PORK) LOCK FLUSH 100 UNIT/ML IV SOLN
500.0000 [IU] | Freq: Once | INTRAVENOUS | Status: AC | PRN
  Administered 2023-08-29: 500 [IU]

## 2023-08-29 MED ORDER — PALONOSETRON HCL INJECTION 0.25 MG/5ML
0.2500 mg | Freq: Once | INTRAVENOUS | Status: AC
  Administered 2023-08-29: 0.25 mg via INTRAVENOUS
  Filled 2023-08-29: qty 5

## 2023-08-29 MED ORDER — DOXORUBICIN HCL CHEMO IV INJECTION 2 MG/ML
50.0000 mg/m2 | Freq: Once | INTRAVENOUS | Status: AC
  Administered 2023-08-29: 156 mg via INTRAVENOUS
  Filled 2023-08-29: qty 78

## 2023-08-29 MED ORDER — SODIUM CHLORIDE 0.9 % IV SOLN
150.0000 mg | Freq: Once | INTRAVENOUS | Status: AC
  Administered 2023-08-29: 150 mg via INTRAVENOUS
  Filled 2023-08-29: qty 150

## 2023-08-29 MED ORDER — DEXAMETHASONE SODIUM PHOSPHATE 10 MG/ML IJ SOLN
10.0000 mg | Freq: Once | INTRAMUSCULAR | Status: AC
  Administered 2023-08-29: 10 mg via INTRAVENOUS
  Filled 2023-08-29: qty 1

## 2023-08-29 NOTE — Progress Notes (Signed)
Reviewed urine culture results with patient while in infusion center. Prescription sent to pharmacy for Macrobid.

## 2023-08-29 NOTE — Progress Notes (Signed)
Patient Care Team: [name], MD as PCP - General (Internal Medicine) Serena Croissant, MD as Medical Oncologist (Hematology and Oncology) Pershing Proud, RN as Oncology Nurse Navigator Donnelly Angelica, RN as Oncology Nurse Navigator  DIAGNOSIS:  Encounter Diagnosis  Name Primary?   Breast cancer metastasized to axillary lymph node, left (HCC) Yes    SUMMARY OF ONCOLOGIC HISTORY: Oncology History  Breast cancer metastasized to axillary lymph node, left (HCC)  07/12/2023 Initial Diagnosis   Patient palpated a lump in the left axilla.  Mammogram did not reveal any primary breast tumor.  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+   08/15/2023 -  Chemotherapy   Patient is on Treatment Plan : BREAST ADJUVANT DOSE DENSE AC q14d / PACLitaxel q7d       CHIEF COMPLIANT: Cycle 2 dose dense Adriamycin and Cytoxan  Discussed the use of AI scribe software for clinical note transcription with the patient, who gave verbal consent to proceed.  History of Present Illness   The patient, with a history of breast cancer, presents for second round of chemotherapy with dose dense Adriamycin and Cytoxan. She reports multiple side effects including nausea, diarrhea, urinary burning, and weakness. The nausea began about a week after chemotherapy and lasted for about four to five days. The patient also reports mouth sores and a burning sensation in her nostrils during the infusion. She has been taking Dextran for nausea, but requires a refill. The patient also reports feeling weak and having difficulty eating.  In addition to her own health concerns, the patient is dealing with significant family stress. Her mother, Nahla Bargman, has recently been diagnosed with liver cancer and has decided not to pursue treatment due to mental health issues. The patient is struggling with this decision, but respects her mother's choice. This situation is causing the patient  significant stress, which is compounded by her own health issues.         ALLERGIES:  is allergic to doxycycline, fish allergy, fish oil, other, pollen extract, citalopram, amoxicillin, penicillins, and strawberry extract.  MEDICATIONS:  Current Outpatient Medications  Medication Sig Dispense Refill   acetaminophen (TYLENOL 8 HOUR) 650 MG CR tablet Take 1 tablet (650 mg total) by mouth every 8 (eight) hours. 30 tablet 0   albuterol (PROAIR HFA) 108 (90 Base) MCG/ACT inhaler Use 2 puffs every 4 hours as needed for cough or wheeze.  May use 2 puffs 10-20 minutes prior to exercise. 1 Inhaler 0   cetirizine (ZYRTEC) 10 MG tablet Take 10 mg by mouth daily.     cyclobenzaprine (FLEXERIL) 10 MG tablet Take 10 mg by mouth 3 (three) times daily.     dexamethasone (DECADRON) 4 MG tablet Take 1 tablet day after chemo and 1 tablet 2 days after chemo with Adriamycin and Cytoxan 8 tablet 0   gabapentin (NEURONTIN) 100 MG capsule Take 1 capsule (100 mg total) by mouth 3 (three) times daily.     glipiZIDE (GLUCOTROL XL) 10 MG 24 hr tablet Take 10 mg by mouth daily with breakfast.     ibuprofen (ADVIL) 200 MG tablet Take 800 mg by mouth every 6 (six) hours as needed.     lidocaine (LIDODERM) 5 % Place 1 patch onto the skin daily. Remove & Discard patch within 12 hours or as directed by MD 30 patch 0   lidocaine-prilocaine (EMLA) cream Apply to affected area once 30 g 3   LINZESS 72 MCG  capsule Take 72 mcg by mouth daily as needed.     losartan (COZAAR) 100 MG tablet Take 1 tablet (100 mg total) by mouth daily. 30 tablet 3   magic mouthwash (lidocaine, diphenhydrAMINE, alum & mag hydroxide) suspension Swish and swallow 5 mLs four times daily as needed for mouth pain. 360 mL 1   metFORMIN (GLUCOPHAGE) 1000 MG tablet Take 1,000 mg by mouth 2 (two) times daily with a meal.     metoprolol succinate (TOPROL-XL) 50 MG 24 hr tablet Take 50 mg by mouth daily.     montelukast (SINGULAIR) 10 MG tablet Take 10 mg by  mouth at bedtime.     MOUNJARO 7.5 MG/0.5ML Pen SMARTSIG:7.5 Milligram(s) SUB-Q Once a Week     naproxen (NAPROSYN) 500 MG tablet Take 1 tablet (500 mg total) by mouth 2 (two) times daily. 30 tablet 0   oxyCODONE (ROXICODONE) 15 MG immediate release tablet Take 15 mg by mouth every 4 (four) hours as needed for pain.     phenazopyridine (PYRIDIUM) 200 MG tablet Take 1 tablet (200 mg total) by mouth 3 (three) times daily as needed for pain. 9 tablet 0   prochlorperazine (COMPAZINE) 10 MG tablet Take 1 tablet (10 mg total) by mouth every 6 (six) hours as needed for nausea or vomiting. 30 tablet 1   SUMAtriptan (IMITREX) 100 MG tablet Take 100 mg by mouth as directed.     topiramate (TOPAMAX) 50 MG tablet Take 50 mg by mouth at bedtime.     No current facility-administered medications for this visit.    PHYSICAL EXAMINATION: ECOG PERFORMANCE STATUS: 1 - Symptomatic but completely ambulatory  Vitals:   08/29/23 1015  BP: (!) 153/84  Pulse: (!) 102  Resp: 19  Temp: 98.1 F (36.7 C)  SpO2: 100%   Filed Weights   08/29/23 1015  Weight: (!) 421 lb 12.8 oz (191.3 kg)    Physical Exam          (exam performed in the presence of a chaperone)  LABORATORY DATA:  I have reviewed the data as listed    Latest Ref Rng & Units 08/29/2023    9:48 AM 08/23/2023   12:28 PM 08/15/2023   11:31 AM  CMP  Glucose 70 - 99 mg/dL 086  578  469   BUN 6 - 20 mg/dL 13  11  10    Creatinine 0.44 - 1.00 mg/dL 6.29  5.28  4.13   Sodium 135 - 145 mmol/L 140  136  136   Potassium 3.5 - 5.1 mmol/L 3.2  3.6  3.8   Chloride 98 - 111 mmol/L 104  103  103   CO2 22 - 32 mmol/L 26  26  28    Calcium 8.9 - 10.3 mg/dL 8.7  9.1  8.8   Total Protein 6.5 - 8.1 g/dL 6.6  7.3  7.0   Total Bilirubin 0.3 - 1.2 mg/dL 0.2  0.4  0.3   Alkaline Phos 38 - 126 U/L 95  123  88   AST 15 - 41 U/L 14  10  11    ALT 0 - 44 U/L 13  9  8      Lab Results  Component Value Date   WBC 11.3 (H) 08/29/2023   HGB 10.5 (L) 08/29/2023    HCT 33.5 (L) 08/29/2023   MCV 69.8 (L) 08/29/2023   PLT 340 08/29/2023   NEUTROABS PENDING 08/29/2023    ASSESSMENT & PLAN:  Breast cancer metastasized to axillary lymph  node, left (HCC) 07/12/2023: Premenopausal patient palpated a lump in the left axilla. Mammogram did not reveal any primary breast tumor. 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 07/27/2023: Bone scan: No evidence of skeletal metastasis  Treatment plan: Neoadjuvant chemotherapy with dose dense Adriamycin and Cytoxan x 4 followed by Taxol weekly x 12 Breast conserving surgery with targeted node dissection at [facility] Adjuvant radiation therapy Adjuvant antiestrogen therapy with CDK inhibitors ----------------------------------------------------------------------------------------------------------------------------------------------------- Current treatment: Cycle 2 dose dense Adriamycin and Cytoxan Chemo toxicities: Swelling and discomfort of the eyes Diarrhea Dysuria Nausea and vomiting starting from day 7 Mouth sores Chemo-induced anemia: Monitoring closely We will reduce the dosage of chemotherapy with cycle 2.  Her mother is in the hospital with liver mass and she does not want to receive any treatment for that.  She is struggling to accept her decision. Return to clinic in 2 weeks for cycle 3     No orders of the defined types were placed in this encounter.  The patient has a good understanding of the overall plan. she agrees with it. she will call with any problems that may develop before the next visit here. Total time spent: 30 mins including face to face time and time spent for planning, charting and co-ordination of care   Tamsen Meek, MD 08/29/23

## 2023-08-29 NOTE — Assessment & Plan Note (Addendum)
07/12/2023: Premenopausal patient palpated a lump in the left axilla. Mammogram did not reveal any primary breast tumor. 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 07/27/2023: Bone scan: No evidence of skeletal metastasis  Treatment plan: Neoadjuvant chemotherapy with dose dense Adriamycin and Cytoxan x 4 followed by Taxol weekly x 12 Breast conserving surgery with targeted node dissection at [facility] Adjuvant radiation therapy Adjuvant antiestrogen therapy with CDK inhibitors ----------------------------------------------------------------------------------------------------------------------------------------------------- Current treatment: Cycle 2 dose dense Adriamycin and Cytoxan Chemo toxicities: Swelling and discomfort of the eyes Diarrhea Dysuria Nausea and vomiting starting from day 7 Mouth sores Chemo-induced anemia: Monitoring closely We will reduce the dosage of chemotherapy with cycle 2.  Her mother is in the hospital with liver mass and she does not want to receive any treatment for that.  She is struggling to accept her decision. Return to clinic in 2 weeks for cycle 3

## 2023-08-29 NOTE — Patient Instructions (Signed)
[place] [facility]  Discharge Instructions: Thank you for choosing [facility] to provide your oncology and hematology care.   If you have a lab appointment with the Cancer Center, please go directly to the Cancer Center and check in at the registration area.   Wear comfortable clothing and clothing appropriate for easy access to any Portacath or PICC line.   We strive to give you quality time with your provider. You may need to reschedule your appointment if you arrive late (15 or more minutes).  Arriving late affects you and other patients whose appointments are after yours.  Also, if you miss three or more appointments without notifying the office, you may be dismissed from the clinic at the provider's discretion.      For prescription refill requests, have your pharmacy contact our office and allow 72 hours for refills to be completed.    Today you received the following chemotherapy and/or immunotherapy agents Cytoxan, Adriamycin      To help prevent nausea and vomiting after your treatment, we encourage you to take your nausea medication as directed.  BELOW ARE SYMPTOMS THAT SHOULD BE REPORTED IMMEDIATELY: *FEVER GREATER THAN 100.4 F (38 C) OR HIGHER *CHILLS OR SWEATING *NAUSEA AND VOMITING THAT IS NOT CONTROLLED WITH YOUR NAUSEA MEDICATION *UNUSUAL SHORTNESS OF BREATH *UNUSUAL BRUISING OR BLEEDING *URINARY PROBLEMS (pain or burning when urinating, or frequent urination) *BOWEL PROBLEMS (unusual diarrhea, constipation, pain near the anus) TENDERNESS IN MOUTH AND THROAT WITH OR WITHOUT PRESENCE OF ULCERS (sore throat, sores in mouth, or a toothache) UNUSUAL RASH, SWELLING OR PAIN  UNUSUAL VAGINAL DISCHARGE OR ITCHING   Items with * indicate a potential emergency and should be followed up as soon as possible or go to the Emergency Department if any problems should occur.  Please show the CHEMOTHERAPY ALERT CARD or IMMUNOTHERAPY ALERT CARD  at check-in to the Emergency Department and triage nurse.  Should you have questions after your visit or need to cancel or reschedule your appointment, please contact [place] [facility]  Dept: [phone]  and follow the prompts.  Office hours are 8:00 a.m. to 4:30 p.m. Monday - Friday. Please note that voicemails left after 4:00 p.m. may not be returned until the following business day.  We are closed weekends and major holidays. You have access to a nurse at all times for urgent questions. Please call the main number to the clinic Dept: [phone] and follow the prompts.   For any non-urgent questions, you may also contact your provider using MyChart. We now offer e-Visits for anyone 35 and older to request care online for non-urgent symptoms. For details visit [email or web address].   Also download the MyChart app! Go to the app store, search "MyChart", open the app, select [place], and log in with your MyChart username and password.  Cyclophosphamide Injection What is this medication? CYCLOPHOSPHAMIDE (sye kloe FOSS fa mide) treats some types of cancer. It works by slowing down the growth of cancer cells. This medicine may be used for other purposes; ask your health care provider or pharmacist if you have questions. COMMON BRAND NAME(S): Cyclophosphamide, Cytoxan, Neosar What should I tell my care team before I take this medication? They need to know if you have any of these conditions: Heart disease Irregular heartbeat or rhythm Infection Kidney problems Liver disease Low blood cell levels (white cells, platelets, or red blood cells) Lung disease Previous radiation Trouble passing urine An  unusual or allergic reaction to cyclophosphamide, other medications, foods, dyes, or preservatives Pregnant or trying to get pregnant Breast-feeding How should I use this medication? This medication is injected into a vein. It is given by your care team in a  hospital or clinic setting. Talk to your care team about the use of this medication in children. Special care may be needed. Overdosage: If you think you have taken too much of this medicine contact a poison control center or emergency room at once. NOTE: This medicine is only for you. Do not share this medicine with others. What if I miss a dose? Keep appointments for follow-up doses. It is important not to miss your dose. Call your care team if you are unable to keep an appointment. What may interact with this medication? Amphotericin B Amiodarone Azathioprine Certain antivirals for HIV or hepatitis Certain medications for blood pressure, such as enalapril, lisinopril, quinapril Cyclosporine Diuretics Etanercept Indomethacin Medications that relax muscles Metronidazole Natalizumab Tamoxifen Warfarin This list may not describe all possible interactions. Give your health care provider a list of all the medicines, herbs, non-prescription drugs, or dietary supplements you use. Also tell them if you smoke, drink alcohol, or use illegal drugs. Some items may interact with your medicine. What should I watch for while using this medication? This medication may make you feel generally unwell. This is not uncommon as chemotherapy can affect healthy cells as well as cancer cells. Report any side effects. Continue your course of treatment even though you feel ill unless your care team tells you to stop. You may need blood work while you are taking this medication. This medication may increase your risk of getting an infection. Call your care team for advice if you get a fever, chills, sore throat, or other symptoms of a cold or flu. Do not treat yourself. Try to avoid being around people who are sick. Avoid taking medications that contain aspirin, acetaminophen, ibuprofen, naproxen, or ketoprofen unless instructed by your care team. These medications may hide a fever. Be careful brushing or flossing  your teeth or using a toothpick because you may get an infection or bleed more easily. If you have any dental work done, tell your dentist you are receiving this medication. Drink water or other fluids as directed. Urinate often, even at night. Some products may contain alcohol. Ask your care team if this medication contains alcohol. Be sure to tell all care teams you are taking this medicine. Certain medicines, like metronidazole and disulfiram, can cause an unpleasant reaction when taken with alcohol. The reaction includes flushing, headache, nausea, vomiting, sweating, and increased thirst. The reaction can last from 30 minutes to several hours. Talk to your care team if you wish to become pregnant or think you might be pregnant. This medication can cause serious birth defects if taken during pregnancy and for 1 year after the last dose. A negative pregnancy test is required before starting this medication. A reliable form of contraception is recommended while taking this medication and for 1 year after the last dose. Talk to your care team about reliable forms of contraception. Do not father a child while taking this medication and for 4 months after the last dose. Use a condom during this time period. Do not breast-feed while taking this medication or for 1 week after the last dose. This medication may cause infertility. Talk to your care team if you are concerned about your fertility. Talk to your care team about your risk of cancer.  You may be more at risk for certain types of cancer if you take this medication. What side effects may I notice from receiving this medication? Side effects that you should report to your care team as soon as possible: Allergic reactions--skin rash, itching, hives, swelling of the face, lips, tongue, or throat Dry cough, shortness of breath or trouble breathing Heart failure--shortness of breath, swelling of the ankles, feet, or hands, sudden weight gain, unusual  weakness or fatigue Heart muscle inflammation--unusual weakness or fatigue, shortness of breath, chest pain, fast or irregular heartbeat, dizziness, swelling of the ankles, feet, or hands Heart rhythm changes--fast or irregular heartbeat, dizziness, feeling faint or lightheaded, chest pain, trouble breathing Infection--fever, chills, cough, sore throat, wounds that don't heal, pain or trouble when passing urine, general feeling of discomfort or being unwell Kidney injury--decrease in the amount of urine, swelling of the ankles, hands, or feet Liver injury--right upper belly pain, loss of appetite, nausea, light-colored stool, dark yellow or brown urine, yellowing skin or eyes, unusual weakness or fatigue Low red blood cell level--unusual weakness or fatigue, dizziness, headache, trouble breathing Low sodium level--muscle weakness, fatigue, dizziness, headache, confusion Red or dark brown urine Unusual bruising or bleeding Side effects that usually do not require medical attention (report to your care team if they continue or are bothersome): Hair loss Irregular menstrual cycles or spotting Loss of appetite Nausea Pain, redness, or swelling with sores inside the mouth or throat Vomiting This list may not describe all possible side effects. Call your doctor for medical advice about side effects. You may report side effects to FDA at 1-800-FDA-1088. Where should I keep my medication? This medication is given in a hospital or clinic. It will not be stored at home. NOTE: This sheet is a summary. It may not cover all possible information. If you have questions about this medicine, talk to your doctor, pharmacist, or health care provider.  2024 Elsevier/Gold Standard (2022-03-05 00:00:00)  Doxorubicin Injection What is this medication? DOXORUBICIN (dox oh ROO bi sin) treats some types of cancer. It works by slowing down the growth of cancer cells. This medicine may be used for other purposes; ask  your health care provider or pharmacist if you have questions. COMMON BRAND NAME(S): Adriamycin, Adriamycin PFS, Adriamycin RDF, Rubex What should I tell my care team before I take this medication? They need to know if you have any of these conditions: Heart disease History of low blood cell levels caused by a medication Liver disease Recent or ongoing radiation An unusual or allergic reaction to doxorubicin, other medications, foods, dyes, or preservatives If you or your partner are pregnant or trying to get pregnant Breast-feeding How should I use this medication? This medication is injected into a vein. It is given by your care team in a hospital or clinic setting. Talk to your care team about the use of this medication in children. Special care may be needed. Overdosage: If you think you have taken too much of this medicine contact a poison control center or emergency room at once. NOTE: This medicine is only for you. Do not share this medicine with others. What if I miss a dose? Keep appointments for follow-up doses. It is important not to miss your dose. Call your care team if you are unable to keep an appointment. What may interact with this medication? 6-mercaptopurine Paclitaxel Phenytoin St. John's wort Trastuzumab Verapamil This list may not describe all possible interactions. Give your health care provider a list of all  the medicines, herbs, non-prescription drugs, or dietary supplements you use. Also tell them if you smoke, drink alcohol, or use illegal drugs. Some items may interact with your medicine. What should I watch for while using this medication? Your condition will be monitored carefully while you are receiving this medication. You may need blood work while taking this medication. This medication may make you feel generally unwell. This is not uncommon as chemotherapy can affect healthy cells as well as cancer cells. Report any side effects. Continue your course of  treatment even though you feel ill unless your care team tells you to stop. There is a maximum amount of this medication you should receive throughout your life. The amount depends on the medical condition being treated and your overall health. Your care team will watch how much of this medication you receive. Tell your care team if you have taken this medication before. Your urine may turn red for a few days after your dose. This is not blood. If your urine is dark or brown, call your care team. In some cases, you may be given additional medications to help with side effects. Follow all directions for their use. This medication may increase your risk of getting an infection. Call your care team for advice if you get a fever, chills, sore throat, or other symptoms of a cold or flu. Do not treat yourself. Try to avoid being around people who are sick. This medication may increase your risk to bruise or bleed. Call your care team if you notice any unusual bleeding. Talk to your care team about your risk of cancer. You may be more at risk for certain types of cancers if you take this medication. Talk to your care team if you or your partner may be pregnant. Serious birth defects can occur if you take this medication during pregnancy and for 6 months after the last dose. Contraception is recommended while taking this medication and for 6 months after the last dose. Your care team can help you find the option that works for you. If your partner can get pregnant, use a condom while taking this medication and for 6 months after the last dose. Do not breastfeed while taking this medication. This medication may cause infertility. Talk to your care team if you are concerned about your fertility. What side effects may I notice from receiving this medication? Side effects that you should report to your care team as soon as possible: Allergic reactions--skin rash, itching, hives, swelling of the face, lips, tongue,  or throat Heart failure--shortness of breath, swelling of the ankles, feet, or hands, sudden weight gain, unusual weakness or fatigue Heart rhythm changes--fast or irregular heartbeat, dizziness, feeling faint or lightheaded, chest pain, trouble breathing Infection--fever, chills, cough, sore throat, wounds that don't heal, pain or trouble when passing urine, general feeling of discomfort or being unwell Low red blood cell level--unusual weakness or fatigue, dizziness, headache, trouble breathing Painful swelling, warmth, or redness of the skin, blisters or sores at the infusion site Unusual bruising or bleeding Side effects that usually do not require medical attention (report to your care team if they continue or are bothersome): Diarrhea Hair loss Nausea Pain, redness, or swelling with sores inside the mouth or throat Red urine This list may not describe all possible side effects. Call your doctor for medical advice about side effects. You may report side effects to FDA at 1-800-FDA-1088. Where should I keep my medication? This medication is given in a hospital  or clinic. It will not be stored at home. NOTE: This sheet is a summary. It may not cover all possible information. If you have questions about this medicine, talk to your doctor, pharmacist, or health care provider.  2024 Elsevier/Gold Standard (2023-01-20 00:00:00)

## 2023-08-30 ENCOUNTER — Ambulatory Visit: Payer: Medicare HMO | Admitting: Hematology and Oncology

## 2023-08-30 ENCOUNTER — Other Ambulatory Visit: Payer: Medicare HMO

## 2023-08-30 ENCOUNTER — Ambulatory Visit: Payer: Medicare HMO

## 2023-08-31 ENCOUNTER — Encounter: Payer: Self-pay | Admitting: Hematology and Oncology

## 2023-08-31 ENCOUNTER — Inpatient Hospital Stay: Payer: Medicare HMO

## 2023-08-31 VITALS — BP 100/57 | HR 86 | Temp 98.0°F | Resp 18

## 2023-08-31 DIAGNOSIS — Z5111 Encounter for antineoplastic chemotherapy: Secondary | ICD-10-CM | POA: Diagnosis not present

## 2023-08-31 DIAGNOSIS — C50912 Malignant neoplasm of unspecified site of left female breast: Secondary | ICD-10-CM

## 2023-08-31 MED ORDER — PEGFILGRASTIM-CBQV 6 MG/0.6ML ~~LOC~~ SOSY
6.0000 mg | PREFILLED_SYRINGE | Freq: Once | SUBCUTANEOUS | Status: DC
Start: 2023-08-31 — End: 2023-08-31

## 2023-08-31 MED ORDER — PEGFILGRASTIM-JMDB 6 MG/0.6ML ~~LOC~~ SOSY
6.0000 mg | PREFILLED_SYRINGE | Freq: Once | SUBCUTANEOUS | Status: AC
  Administered 2023-08-31: 6 mg via SUBCUTANEOUS
  Filled 2023-08-31: qty 0.6

## 2023-08-31 NOTE — Progress Notes (Signed)
Changed Udenyca to Fulphila due to insurance preferred biosimilar with [employer].  Ok per PA team.  Richardean Sale, RPH, BCPS, BCOP 08/31/2023 10:21 AM

## 2023-09-01 ENCOUNTER — Telehealth: Payer: Self-pay

## 2023-09-01 ENCOUNTER — Other Ambulatory Visit: Payer: Self-pay

## 2023-09-01 ENCOUNTER — Ambulatory Visit: Payer: Medicare HMO

## 2023-09-01 ENCOUNTER — Encounter: Payer: Self-pay | Admitting: Hematology and Oncology

## 2023-09-01 DIAGNOSIS — C50912 Malignant neoplasm of unspecified site of left female breast: Secondary | ICD-10-CM

## 2023-09-01 DIAGNOSIS — Z86711 Personal history of pulmonary embolism: Secondary | ICD-10-CM

## 2023-09-01 DIAGNOSIS — R0602 Shortness of breath: Secondary | ICD-10-CM

## 2023-09-01 DIAGNOSIS — R051 Acute cough: Secondary | ICD-10-CM

## 2023-09-01 NOTE — Telephone Encounter (Signed)
Spoke with pt regarding MyChart message. She reports cough and SHOB, pain on inspiration and with cough. Denies sore throat, nasal drip/congestion, fatigue or body aches. She reports having chills since she started chemo. Highest temp without tylenol is 99.4 F oral. She will avoid tylenol unless temp reaches 100.4 or greater. She was offered appt with [facility] and accepted for 1200 09/02/23 and will go to have a CXR.

## 2023-09-02 ENCOUNTER — Telehealth: Payer: Self-pay

## 2023-09-02 ENCOUNTER — Ambulatory Visit (HOSPITAL_COMMUNITY)
Admission: RE | Admit: 2023-09-02 | Discharge: 2023-09-02 | Disposition: A | Discharge status: Home / Self Care | Payer: Medicare HMO | Source: Ambulatory Visit | Attending: Physician Assistant | Admitting: Physician Assistant

## 2023-09-02 ENCOUNTER — Inpatient Hospital Stay: Payer: Medicare HMO

## 2023-09-02 ENCOUNTER — Other Ambulatory Visit: Payer: Self-pay | Admitting: Physician Assistant

## 2023-09-02 ENCOUNTER — Other Ambulatory Visit: Payer: Self-pay

## 2023-09-02 ENCOUNTER — Inpatient Hospital Stay: Payer: Medicare HMO | Attending: Physician Assistant | Admitting: Physician Assistant

## 2023-09-02 VITALS — BP 111/69 | HR 88 | Temp 98.1°F | Resp 18 | Wt >= 6400 oz

## 2023-09-02 DIAGNOSIS — T82514A Breakdown (mechanical) of infusion catheter, initial encounter: Secondary | ICD-10-CM | POA: Insufficient documentation

## 2023-09-02 DIAGNOSIS — G4730 Sleep apnea, unspecified: Secondary | ICD-10-CM | POA: Insufficient documentation

## 2023-09-02 DIAGNOSIS — R197 Diarrhea, unspecified: Secondary | ICD-10-CM | POA: Insufficient documentation

## 2023-09-02 DIAGNOSIS — R110 Nausea: Secondary | ICD-10-CM

## 2023-09-02 DIAGNOSIS — C50912 Malignant neoplasm of unspecified site of left female breast: Secondary | ICD-10-CM | POA: Insufficient documentation

## 2023-09-02 DIAGNOSIS — D6481 Anemia due to antineoplastic chemotherapy: Secondary | ICD-10-CM | POA: Insufficient documentation

## 2023-09-02 DIAGNOSIS — Z79899 Other long term (current) drug therapy: Secondary | ICD-10-CM | POA: Diagnosis not present

## 2023-09-02 DIAGNOSIS — Z5189 Encounter for other specified aftercare: Secondary | ICD-10-CM | POA: Diagnosis not present

## 2023-09-02 DIAGNOSIS — I10 Essential (primary) hypertension: Secondary | ICD-10-CM | POA: Insufficient documentation

## 2023-09-02 DIAGNOSIS — D509 Iron deficiency anemia, unspecified: Secondary | ICD-10-CM

## 2023-09-02 DIAGNOSIS — E669 Obesity, unspecified: Secondary | ICD-10-CM | POA: Insufficient documentation

## 2023-09-02 DIAGNOSIS — H6692 Otitis media, unspecified, left ear: Secondary | ICD-10-CM | POA: Diagnosis not present

## 2023-09-02 DIAGNOSIS — R0602 Shortness of breath: Secondary | ICD-10-CM | POA: Diagnosis present

## 2023-09-02 DIAGNOSIS — C773 Secondary and unspecified malignant neoplasm of axilla and upper limb lymph nodes: Secondary | ICD-10-CM | POA: Diagnosis present

## 2023-09-02 DIAGNOSIS — Z170 Estrogen receptor positive status [ER+]: Secondary | ICD-10-CM | POA: Diagnosis not present

## 2023-09-02 DIAGNOSIS — T451X5A Adverse effect of antineoplastic and immunosuppressive drugs, initial encounter: Secondary | ICD-10-CM | POA: Insufficient documentation

## 2023-09-02 DIAGNOSIS — G8929 Other chronic pain: Secondary | ICD-10-CM | POA: Insufficient documentation

## 2023-09-02 DIAGNOSIS — Z803 Family history of malignant neoplasm of breast: Secondary | ICD-10-CM | POA: Insufficient documentation

## 2023-09-02 DIAGNOSIS — D573 Sickle-cell trait: Secondary | ICD-10-CM | POA: Diagnosis not present

## 2023-09-02 DIAGNOSIS — J45901 Unspecified asthma with (acute) exacerbation: Secondary | ICD-10-CM | POA: Insufficient documentation

## 2023-09-02 DIAGNOSIS — R051 Acute cough: Secondary | ICD-10-CM | POA: Insufficient documentation

## 2023-09-02 DIAGNOSIS — R112 Nausea with vomiting, unspecified: Secondary | ICD-10-CM | POA: Insufficient documentation

## 2023-09-02 DIAGNOSIS — Z7984 Long term (current) use of oral hypoglycemic drugs: Secondary | ICD-10-CM | POA: Insufficient documentation

## 2023-09-02 DIAGNOSIS — Z86711 Personal history of pulmonary embolism: Secondary | ICD-10-CM | POA: Diagnosis present

## 2023-09-02 DIAGNOSIS — Z5111 Encounter for antineoplastic chemotherapy: Secondary | ICD-10-CM | POA: Diagnosis present

## 2023-09-02 DIAGNOSIS — D500 Iron deficiency anemia secondary to blood loss (chronic): Secondary | ICD-10-CM

## 2023-09-02 DIAGNOSIS — N390 Urinary tract infection, site not specified: Secondary | ICD-10-CM | POA: Insufficient documentation

## 2023-09-02 DIAGNOSIS — R0681 Apnea, not elsewhere classified: Secondary | ICD-10-CM | POA: Insufficient documentation

## 2023-09-02 DIAGNOSIS — E119 Type 2 diabetes mellitus without complications: Secondary | ICD-10-CM | POA: Diagnosis not present

## 2023-09-02 DIAGNOSIS — Z791 Long term (current) use of non-steroidal anti-inflammatories (NSAID): Secondary | ICD-10-CM | POA: Diagnosis not present

## 2023-09-02 LAB — CBC WITH DIFFERENTIAL (CANCER CENTER ONLY)
Abs Immature Granulocytes: 0 10*3/uL (ref 0.00–0.07)
Band Neutrophils: 1 %
Basophils Absolute: 0 10*3/uL (ref 0.0–0.1)
Basophils Relative: 0 %
Eosinophils Absolute: 0 10*3/uL (ref 0.0–0.5)
Eosinophils Relative: 0 %
HCT: 31.1 % — ABNORMAL LOW (ref 36.0–46.0)
Hemoglobin: 10 g/dL — ABNORMAL LOW (ref 12.0–15.0)
Lymphocytes Relative: 3 %
Lymphs Abs: 1.9 10*3/uL (ref 0.7–4.0)
MCH: 22.4 pg — ABNORMAL LOW (ref 26.0–34.0)
MCHC: 32.2 g/dL (ref 30.0–36.0)
MCV: 69.6 fL — ABNORMAL LOW (ref 80.0–100.0)
Monocytes Absolute: 0.6 10*3/uL (ref 0.1–1.0)
Monocytes Relative: 1 %
Neutro Abs: 61.8 10*3/uL — ABNORMAL HIGH (ref 1.7–7.7)
Neutrophils Relative %: 95 %
Platelet Count: 323 10*3/uL (ref 150–400)
RBC: 4.47 MIL/uL (ref 3.87–5.11)
RDW: 19.6 % — ABNORMAL HIGH (ref 11.5–15.5)
Smear Review: NORMAL
WBC Count: 64.4 10*3/uL (ref 4.0–10.5)
nRBC: 0 % (ref 0.0–0.2)

## 2023-09-02 LAB — CMP (CANCER CENTER ONLY)
ALT: 10 U/L (ref 0–44)
AST: 12 U/L — ABNORMAL LOW (ref 15–41)
Albumin: 3.4 g/dL — ABNORMAL LOW (ref 3.5–5.0)
Alkaline Phosphatase: 202 U/L — ABNORMAL HIGH (ref 38–126)
Anion gap: 6 (ref 5–15)
BUN: 13 mg/dL (ref 6–20)
CO2: 27 mmol/L (ref 22–32)
Calcium: 8.7 mg/dL — ABNORMAL LOW (ref 8.9–10.3)
Chloride: 105 mmol/L (ref 98–111)
Creatinine: 0.72 mg/dL (ref 0.44–1.00)
GFR, Estimated: >60 mL/min (ref >60)
Glucose, Bld: 167 mg/dL — ABNORMAL HIGH (ref 70–99)
Potassium: 3.8 mmol/L (ref 3.5–5.1)
Sodium: 138 mmol/L (ref 135–145)
Total Bilirubin: 0.3 mg/dL (ref 0.3–1.2)
Total Protein: 6.5 g/dL (ref 6.5–8.1)

## 2023-09-02 LAB — MAGNESIUM: Magnesium: 1.6 mg/dL — ABNORMAL LOW (ref 1.7–2.4)

## 2023-09-02 MED ORDER — DEXAMETHASONE 4 MG PO TABS: ORAL_TABLET | ORAL | 0 refills | Status: DC

## 2023-09-02 MED ORDER — OXYCODONE HCL 5 MG PO TABS
20.0000 mg | ORAL_TABLET | Freq: Once | ORAL | Status: AC
  Administered 2023-09-02: 20 mg via ORAL
  Filled 2023-09-02: qty 4

## 2023-09-02 MED ORDER — BENZONATATE 100 MG PO CAPS: 100.0000 mg | ORAL_CAPSULE | Freq: Three times a day (TID) | ORAL | 0 refills | Status: DC | PRN

## 2023-09-02 MED ORDER — SODIUM CHLORIDE 0.9 % IV SOLN: Freq: Once | INTRAVENOUS | Status: AC

## 2023-09-02 MED ORDER — AMOXICILLIN-POT CLAVULANATE 875-125 MG PO TABS: 1.0000 | ORAL_TABLET | Freq: Two times a day (BID) | ORAL | 0 refills | Status: AC

## 2023-09-02 MED ORDER — FAMOTIDINE IN NACL 20-0.9 MG/50ML-% IV SOLN
20.0000 mg | Freq: Once | INTRAVENOUS | Status: AC
  Administered 2023-09-02: 20 mg via INTRAVENOUS
  Filled 2023-09-02: qty 50

## 2023-09-02 MED ORDER — SODIUM CHLORIDE 0.9% FLUSH
10.0000 mL | Freq: Once | INTRAVENOUS | Status: AC
  Administered 2023-09-02: 10 mL

## 2023-09-02 NOTE — Telephone Encounter (Signed)
CRITICAL VALUE STICKER  CRITICAL VALUE: [id number]  RECEIVER (on-site recipient of call): Sherman Lipuma LPN  DATE & TIME NOTIFIED: 11/1 1:58  MESSENGER (representative from lab): Amber from the lab  MD NOTIFIED: Gudena  TIME OF NOTIFICATION: 1:58  RESPONSE: Made physician aware

## 2023-09-02 NOTE — Progress Notes (Signed)
Symptom Management Consult Note [facility]    Patient Care Team: [name], MD as PCP - General (Internal Medicine) Serena Croissant, MD as Medical Oncologist (Hematology and Oncology) Pershing Proud, RN as Oncology Nurse Navigator Donnelly Angelica, RN as Oncology Nurse Navigator    Name / MRN / [id number] [name]  [id number]  01-11-1976   Date of visit: 09/02/2023   Chief Complaint/Reason for visit: cough, diarrhea   Current Therapy: Adriamycin and cytoxan with [id number]-jmdb   Last treatment:  Day 1   Cycle 2 on 08/29/23 with G-CSF on 08/31/23   ASSESSMENT & PLAN: Patient is a 47 y.o. 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 Pamelia Hoit.  I have viewed most recent oncology note and lab work.   #Left breast cancer metastasized to axillary lymph  - Next appointment with oncologist is 09/12/23   #Chemotherapy-induced side effects -Ongoing symptoms including cough, wheezing, shortness of breath, and diarrhea. Symptoms are improved compared to initial treatment due to dose reduction. -Continue current treatment regimen. -Start over-the-counter Pepcid for acid reflux. Patient received IVF and pepcid in clinic for hydration support. -CBC with leukocytosis WBC 64.4, suspect this is related pegfilgrastim.  #Asthma exacerbation -History of asthma with increased wheezing and shortness of breath. Using inhalers with some relief. Recommended patient continue use of inhalers. -Chest xray is negative for acute infectious processes. I viewed image and agree with radiologist impresion. -Would consider need for short course of prednisone if symptoms worsen. -Prescription sent to pharmacy for tessalon perles to help with cough.  #Diarrhea -Soft stools with increased frequency for the past four days. Previously managed with Imodium. -Resume Imodium as needed for diarrhea control.  #Left sided otalgia -PE with  signs of acute otitis media. -Prescription sent to pharmacy for Augmentin. Patient has documented history of hives with amoxicillin. Per patient she has tolerated Augmentin in the past..   #Pain management - Chronic pain is managed by [facility].  -Pharmacy without medication in stock. Patient given does of PO pain medicine here in clinic.     Strict ED precautions discussed should symptoms worsen.   Heme/Onc History: Oncology History  Breast cancer metastasized to axillary lymph node, left (HCC)  07/12/2023 Initial Diagnosis   Patient palpated a lump in the left axilla.  Mammogram did not reveal any primary breast tumor.  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+   08/15/2023 -  Chemotherapy   Patient is on Treatment Plan : BREAST ADJUVANT DOSE DENSE AC q14d / PACLitaxel q7d         Interval history-: Discussed the use of AI scribe software for clinical note transcription with the patient, who gave verbal consent to proceed.    [name] is a 47 y.o. female with oncologic history as above presenting to [facility] today with chief complaint of multiple symptoms including coughing, wheezing, shortness of breath, and chest burning. These symptoms began approximately three days ago, with the patient noting productive cough with yellowish sputum in the mornings. The patient has been using their asthma inhalers, which provide some relief, but still experiences dyspnea with minimal exertion. She has required steroid pills in the past for her asthma when the weather is cold typically, however does not feel that she needs them at this time based on her symptoms.  The patient also reports gastrointestinal symptoms, including diarrhea and stomach burning.  The diarrhea, characterized as soft stools, began about four days ago and has been occurring about three times daily. The patient had been managing this with Imodium, but ran out after the first  day post-chemotherapy. Patient adds her left ear has constant worsening throbbing pain x 2 days. Denies any associated injury.  The patient's symptoms appear to be somewhat improved compared to their initial post-chemotherapy presentation, likely due to a reduction in the chemotherapy dosage. The patient has been able to eat more and has not experienced mouth sores since the dosage reduction. However, the patient still experiences nausea, which they manage with Compazine, and has noted changes in their nails and hair loss, which they attribute to the chemotherapy.  In addition to their own health concerns, the patient is also dealing with significant emotional stress related to their mother's recent diagnosis of stage 4 colon and liver cancer. The patient's mother has chosen not to pursue chemotherapy, which has led the patient to reflect on their own treatment choices and the impact of their illness on their family. Despite these challenges, the patient remains focused on their recovery and is committed to continuing their treatment.    ROS  All other systems are reviewed and are negative for acute change except as noted in the HPI.    Allergies  Allergen Reactions   Doxycycline Hives   Fish Allergy Hives and Shortness Of Breath    Gets dizzy   Fish Oil Anaphylaxis   Other Hives, Anaphylaxis and Itching    Allergic to nuts. Causes mouth to itch, breaks out in hives, trouble breathing   Pollen Extract Itching   Citalopram Itching and Hives    unknown Made her pass out   Amoxicillin     REACTION: hives Has patient had a PCN reaction causing immediate rash, facial/tongue/throat swelling, SOB or lightheadedness with hypotension: Yes Has patient had a PCN reaction causing severe rash involving mucus membranes or skin necrosis: No Has patient had a PCN reaction that required hospitalization No Has patient had a PCN reaction occurring within the last 10 years: No If all of the above answers  are "NO", then may proceed with Cephalosporin use.    Penicillins Hives   Strawberry Extract     Makes mouth itch     Past Medical History:  Diagnosis Date   Allergic rhinitis    Anemia    Anxiety disorder    Asthma    Benign essential hypertension    Depression    Diabetes mellitus without complication (HCC)    Edema    History of blood clots    Hyperglycemia    Lumbar radiculopathy    Metrorrhagia    Morbid obesity (HCC)    OSA (obstructive sleep apnea)    Sickle cell trait (HCC)    Snoring      Past Surgical History:  Procedure Laterality Date   CESAREAN SECTION     CHOLECYSTECTOMY     IR IMAGING GUIDED PORT INSERTION  08/08/2023   TUBAL LIGATION      Social History   Socioeconomic History   Marital status: Single    Spouse name: Not on file   Number of children: 3   Years of education: 16   Highest education level: Not on file  Occupational History   Not on file  Tobacco Use   Smoking status: Never   Smokeless tobacco: Never  Vaping Use   Vaping status: Never Used  Substance and Sexual Activity   Alcohol  use: Not Currently   Drug use: No   Sexual activity: Yes    Partners: Male    Birth control/protection: Surgical  Other Topics Concern   Not on file  Social History Narrative   Patient is single and lives with her children.   Patient has three children.   Patient is currently unemployed.   Patient is a Holiday representative in college now.   Patient is right-handed.   Patient drinks one cup of tea every other day.   Social Determinants of Health   Financial Resource Strain: Not on file  Food Insecurity: Not on file  Transportation Needs: Not on file  Physical Activity: Not on file  Stress: Not on file  Social Connections: Unknown (06/07/2023)   [facility] from [facility]   Social Network    Social Network: Not on file  Intimate Partner Violence: Unknown (06/07/2023)   Received from [facility]   HITS    Physically Hurt: Not on file    Insult or  Talk Down To: Not on file    Threaten Physical Harm: Not on file    Scream or Curse: Not on file    Family History  Problem Relation Age of Onset   Asthma Mother    Hypertension Mother    Leukemia Father    Diabetes Father    Breast cancer Maternal Aunt    Breast cancer Paternal Aunt    Breast cancer Cousin    Diabetes Brother      Current Outpatient Medications:    acetaminophen (TYLENOL 8 HOUR) 650 MG CR tablet, Take 1 tablet (650 mg total) by mouth every 8 (eight) hours., Disp: 30 tablet, Rfl: 0   albuterol (PROAIR HFA) 108 (90 Base) MCG/ACT inhaler, Use 2 puffs every 4 hours as needed for cough or wheeze.  May use 2 puffs 10-20 minutes prior to exercise., Disp: 1 Inhaler, Rfl: 0   amoxicillin-clavulanate (AUGMENTIN) 875-125 MG tablet, Take 1 tablet by mouth 2 (two) times daily for 7 days., Disp: 14 tablet, Rfl: 0   benzonatate (TESSALON) 100 MG capsule, Take 1 capsule (100 mg total) by mouth 3 (three) times daily as needed for cough., Disp: 20 capsule, Rfl: 0   cetirizine (ZYRTEC) 10 MG tablet, Take 10 mg by mouth daily., Disp: , Rfl:    cyclobenzaprine (FLEXERIL) 10 MG tablet, Take 10 mg by mouth 3 (three) times daily., Disp: , Rfl:    gabapentin (NEURONTIN) 100 MG capsule, Take 1 capsule (100 mg total) by mouth 3 (three) times daily., Disp: , Rfl:    glipiZIDE (GLUCOTROL XL) 10 MG 24 hr tablet, Take 10 mg by mouth daily with breakfast., Disp: , Rfl:    ibuprofen (ADVIL) 200 MG tablet, Take 800 mg by mouth every 6 (six) hours as needed., Disp: , Rfl:    lidocaine (LIDODERM) 5 %, Place 1 patch onto the skin daily. Remove & Discard patch within 12 hours or as directed by MD, Disp: 30 patch, Rfl: 0   lidocaine-prilocaine (EMLA) cream, Apply to affected area once, Disp: 30 g, Rfl: 3   LINZESS 72 MCG capsule, Take 72 mcg by mouth daily as needed., Disp: , Rfl:    losartan (COZAAR) 100 MG tablet, Take 1 tablet (100 mg total) by mouth daily., Disp: 30 tablet, Rfl: 3   magic mouthwash  (lidocaine, diphenhydrAMINE, alum & mag hydroxide) suspension, Swish and swallow 5 mLs four times daily as needed for mouth pain., Disp: 360 mL, Rfl: 1   metFORMIN (GLUCOPHAGE) 1000  MG tablet, Take 1,000 mg by mouth 2 (two) times daily with a meal., Disp: , Rfl:    metoprolol succinate (TOPROL-XL) 50 MG 24 hr tablet, Take 50 mg by mouth daily., Disp: , Rfl:    montelukast (SINGULAIR) 10 MG tablet, Take 10 mg by mouth at bedtime., Disp: , Rfl:    MOUNJARO 7.5 MG/0.5ML Pen, SMARTSIG:7.5 Milligram(s) SUB-Q Once a Week, Disp: , Rfl:    naproxen (NAPROSYN) 500 MG tablet, Take 1 tablet (500 mg total) by mouth 2 (two) times daily., Disp: 30 tablet, Rfl: 0   nitrofurantoin, macrocrystal-monohydrate, (MACROBID) 100 MG capsule, Take 1 capsule (100 mg total) by mouth 2 (two) times daily for 5 days., Disp: 10 capsule, Rfl: 0   oxyCODONE (ROXICODONE) 15 MG immediate release tablet, Take 15 mg by mouth every 4 (four) hours as needed for pain., Disp: , Rfl:    phenazopyridine (PYRIDIUM) 200 MG tablet, Take 1 tablet (200 mg total) by mouth 3 (three) times daily as needed for pain., Disp: 9 tablet, Rfl: 0   prochlorperazine (COMPAZINE) 10 MG tablet, Take 1 tablet (10 mg total) by mouth every 6 (six) hours as needed for nausea or vomiting., Disp: 30 tablet, Rfl: 1   SUMAtriptan (IMITREX) 100 MG tablet, Take 100 mg by mouth as directed., Disp: , Rfl:    topiramate (TOPAMAX) 50 MG tablet, Take 50 mg by mouth at bedtime., Disp: , Rfl:    dexamethasone (DECADRON) 4 MG tablet, Take 1 tablet day after chemo and 1 tablet 2 days after chemo with Adriamycin and Cytoxan, Disp: 8 tablet, Rfl: 0  PHYSICAL EXAM: ECOG FS:1 - Symptomatic but completely ambulatory    Vitals:   09/02/23 1216 09/02/23 1501  BP: 111/69   Pulse: (!) 105 88  Resp: 18   Temp: 98.1 F (36.7 C)   SpO2: 99% 100%  Weight: (!) 420 lb 12.8 oz (190.9 kg)    Physical Exam Vitals and nursing note reviewed.  Constitutional:      Appearance: She is not  ill-appearing or toxic-appearing.  HENT:     Head: Normocephalic.     Right Ear: External ear normal.     Left Ear: External ear normal. Tympanic membrane is erythematous.     Nose: Nose normal.     Mouth/Throat:     Mouth: Mucous membranes are moist.     Pharynx: Oropharynx is clear.  Eyes:     Conjunctiva/sclera: Conjunctivae normal.  Cardiovascular:     Rate and Rhythm: Normal rate and regular rhythm.     Pulses: Normal pulses.     Heart sounds: Normal heart sounds.  Pulmonary:     Effort: Pulmonary effort is normal. No respiratory distress.     Breath sounds: Normal breath sounds. No wheezing or rales.  Abdominal:     General: There is no distension.     Tenderness: There is no abdominal tenderness.  Musculoskeletal:     Cervical back: Normal range of motion.  Skin:    General: Skin is warm and dry.     Comments: PAC without signs of infection  Neurological:     Mental Status: She is alert.  Psychiatric:        Mood and Affect: Affect is tearful.        LABORATORY DATA: I have reviewed the data as listed    Latest Ref Rng & Units 09/02/2023    1:41 PM 08/29/2023    9:45 AM 08/23/2023   12:28 PM  CBC  WBC 4.0 - 10.5 K/uL 64.4  11.3  2.9   Hemoglobin 12.0 - 15.0 g/dL 52.8  41.3  24.4   Hematocrit 36.0 - 46.0 % 31.1  33.5  35.9   Platelets 150 - 400 K/uL 323  340  195         Latest Ref Rng & Units 09/02/2023    1:41 PM 08/29/2023    9:48 AM 08/23/2023   12:28 PM  CMP  Glucose 70 - 99 mg/dL 010  272  536   BUN 6 - 20 mg/dL 13  13  11    Creatinine 0.44 - 1.00 mg/dL 6.44  0.34  7.42   Sodium 135 - 145 mmol/L 138  140  136   Potassium 3.5 - 5.1 mmol/L 3.8  3.2  3.6   Chloride 98 - 111 mmol/L 105  104  103   CO2 22 - 32 mmol/L 27  26  26    Calcium 8.9 - 10.3 mg/dL 8.7  8.7  9.1   Total Protein 6.5 - 8.1 g/dL 6.5  6.6  7.3   Total Bilirubin 0.3 - 1.2 mg/dL 0.3  0.2  0.4   Alkaline Phos 38 - 126 U/L 202  95  123   AST 15 - 41 U/L 12  14  10    ALT 0 - 44  U/L 10  13  9         RADIOGRAPHIC STUDIES (from last 24 hours if applicable) I have personally reviewed the radiological images as listed and agreed with the findings in the report. DG Chest 2 View  Result Date: 09/02/2023 CLINICAL DATA:  Cough, shortness of breath, chest pain, history of breast cancer EXAM: CHEST - 2 VIEW COMPARISON:  11/20/2021 FINDINGS: Limited exam because of technique and body habitus. Right IJ power port catheter tip SVC RA junction. Stable heart size and vascularity. No definite focal pneumonia, collapse or consolidation. Negative for edema, large effusion or pneumothorax. Trachea midline. IMPRESSION: Limited exam. No acute chest process by plain radiography. Electronically Signed   By: Judie Petit.  Shick M.D.   On: 09/02/2023 13:04        Visit Diagnosis: 1. Breast cancer metastasized to axillary lymph node, left (HCC)   2. Nausea without vomiting   3. Left otitis media, unspecified otitis media type   4. Diarrhea, unspecified type      No orders of the defined types were placed in this encounter.   All questions were answered. The patient knows to call the clinic with any problems, questions or concerns. No barriers to learning was detected.  A total of more than 30 minutes were spent on this encounter with face-to-face time and non-face-to-face time, including preparing to see the patient, ordering tests and/or medications, counseling the patient and coordination of care as outlined above.    Thank you for allowing me to participate in the care of this patient.    Shanon Ace, PA-C Department of Hematology/Oncology [facility] Phone: [phone]  [phone]    09/02/2023 4:39 PM

## 2023-09-09 MED FILL — Fosaprepitant Dimeglumine For IV Infusion 150 MG (Base Eq): INTRAVENOUS | Qty: 5 | Status: AC

## 2023-09-12 ENCOUNTER — Inpatient Hospital Stay: Payer: Medicare HMO

## 2023-09-12 ENCOUNTER — Inpatient Hospital Stay (HOSPITAL_BASED_OUTPATIENT_CLINIC_OR_DEPARTMENT_OTHER): Payer: Medicare HMO | Admitting: Hematology and Oncology

## 2023-09-12 VITALS — BP 117/70 | HR 60 | Temp 97.9°F | Resp 18 | Ht 69.0 in | Wt >= 6400 oz

## 2023-09-12 DIAGNOSIS — Z5111 Encounter for antineoplastic chemotherapy: Secondary | ICD-10-CM | POA: Diagnosis not present

## 2023-09-12 DIAGNOSIS — C50912 Malignant neoplasm of unspecified site of left female breast: Secondary | ICD-10-CM | POA: Diagnosis not present

## 2023-09-12 DIAGNOSIS — D500 Iron deficiency anemia secondary to blood loss (chronic): Secondary | ICD-10-CM

## 2023-09-12 DIAGNOSIS — C773 Secondary and unspecified malignant neoplasm of axilla and upper limb lymph nodes: Secondary | ICD-10-CM

## 2023-09-12 DIAGNOSIS — D509 Iron deficiency anemia, unspecified: Secondary | ICD-10-CM

## 2023-09-12 LAB — CMP (CANCER CENTER ONLY)
ALT: 18 U/L (ref 0–44)
AST: 19 U/L (ref 15–41)
Albumin: 3.4 g/dL — ABNORMAL LOW (ref 3.5–5.0)
Alkaline Phosphatase: 113 U/L (ref 38–126)
Anion gap: 9 (ref 5–15)
BUN: 6 mg/dL (ref 6–20)
CO2: 27 mmol/L (ref 22–32)
Calcium: 8.7 mg/dL — ABNORMAL LOW (ref 8.9–10.3)
Chloride: 104 mmol/L (ref 98–111)
Creatinine: 0.9 mg/dL (ref 0.44–1.00)
GFR, Estimated: >60 mL/min (ref >60)
Glucose, Bld: 195 mg/dL — ABNORMAL HIGH (ref 70–99)
Potassium: 3.3 mmol/L — ABNORMAL LOW (ref 3.5–5.1)
Sodium: 140 mmol/L (ref 135–145)
Total Bilirubin: 0.3 mg/dL (ref <1.2)
Total Protein: 6.4 g/dL — ABNORMAL LOW (ref 6.5–8.1)

## 2023-09-12 LAB — CBC WITH DIFFERENTIAL (CANCER CENTER ONLY)
Abs Immature Granulocytes: 0.37 10*3/uL — ABNORMAL HIGH (ref 0.00–0.07)
Basophils Absolute: 0 10*3/uL (ref 0.0–0.1)
Basophils Relative: 0 %
Eosinophils Absolute: 0 10*3/uL (ref 0.0–0.5)
Eosinophils Relative: 0 %
HCT: 33.9 % — ABNORMAL LOW (ref 36.0–46.0)
Hemoglobin: 10.5 g/dL — ABNORMAL LOW (ref 12.0–15.0)
Immature Granulocytes: 5 %
Lymphocytes Relative: 21 %
Lymphs Abs: 1.5 10*3/uL (ref 0.7–4.0)
MCH: 22.2 pg — ABNORMAL LOW (ref 26.0–34.0)
MCHC: 31 g/dL (ref 30.0–36.0)
MCV: 71.5 fL — ABNORMAL LOW (ref 80.0–100.0)
Monocytes Absolute: 0.5 10*3/uL (ref 0.1–1.0)
Monocytes Relative: 6 %
Neutro Abs: 4.7 10*3/uL (ref 1.7–7.7)
Neutrophils Relative %: 68 %
Platelet Count: 294 10*3/uL (ref 150–400)
RBC: 4.74 MIL/uL (ref 3.87–5.11)
RDW: 21.6 % — ABNORMAL HIGH (ref 11.5–15.5)
WBC Count: 7.1 10*3/uL (ref 4.0–10.5)
nRBC: 1 % — ABNORMAL HIGH (ref 0.0–0.2)

## 2023-09-12 MED ORDER — DEXAMETHASONE SODIUM PHOSPHATE 10 MG/ML IJ SOLN
10.0000 mg | Freq: Once | INTRAMUSCULAR | Status: AC
  Administered 2023-09-12: 10 mg via INTRAVENOUS
  Filled 2023-09-12: qty 1

## 2023-09-12 MED ORDER — SODIUM CHLORIDE 0.9% FLUSH
10.0000 mL | INTRAVENOUS | Status: DC | PRN
Start: 2023-09-12 — End: 2023-09-12
  Administered 2023-09-12: 10 mL

## 2023-09-12 MED ORDER — ALTEPLASE 2 MG IJ SOLR
2.0000 mg | Freq: Once | INTRAMUSCULAR | Status: AC
  Administered 2023-09-12: 2 mg
  Filled 2023-09-12: qty 2

## 2023-09-12 MED ORDER — HEPARIN SOD (PORK) LOCK FLUSH 100 UNIT/ML IV SOLN
500.0000 [IU] | Freq: Once | INTRAVENOUS | Status: AC | PRN
  Administered 2023-09-12: 500 [IU]

## 2023-09-12 MED ORDER — SODIUM CHLORIDE 0.9 % IV SOLN: Freq: Once | INTRAVENOUS | Status: AC

## 2023-09-12 MED ORDER — SODIUM CHLORIDE 0.9 % IV SOLN
150.0000 mg | Freq: Once | INTRAVENOUS | Status: AC
  Administered 2023-09-12: 150 mg via INTRAVENOUS
  Filled 2023-09-12: qty 150

## 2023-09-12 MED ORDER — DIPHENOXYLATE-ATROPINE 2.5-0.025 MG PO TABS: 1.0000 | ORAL_TABLET | Freq: Four times a day (QID) | ORAL | 3 refills | Status: DC | PRN

## 2023-09-12 MED ORDER — SODIUM CHLORIDE 0.9% FLUSH: 10.0000 mL | Freq: Once | INTRAVENOUS | Status: DC

## 2023-09-12 MED ORDER — PALONOSETRON HCL INJECTION 0.25 MG/5ML
0.2500 mg | Freq: Once | INTRAVENOUS | Status: AC
Start: 2023-09-12 — End: 2023-09-12
  Administered 2023-09-12: 0.25 mg via INTRAVENOUS
  Filled 2023-09-12: qty 5

## 2023-09-12 MED ORDER — SODIUM CHLORIDE 0.9 % IV SOLN
500.0000 mg/m2 | Freq: Once | INTRAVENOUS | Status: AC
  Administered 2023-09-12: 1500 mg via INTRAVENOUS
  Filled 2023-09-12: qty 75

## 2023-09-12 MED ORDER — DOXORUBICIN HCL CHEMO IV INJECTION 2 MG/ML
50.0000 mg/m2 | Freq: Once | INTRAVENOUS | Status: AC
  Administered 2023-09-12: 156 mg via INTRAVENOUS
  Filled 2023-09-12: qty 78

## 2023-09-12 NOTE — Progress Notes (Signed)
Per Pamelia Hoit, MD, okay to proceed with premedications while waiting for CMP results.

## 2023-09-12 NOTE — Assessment & Plan Note (Signed)
07/12/2023: Premenopausal patient palpated a lump in the left axilla. Mammogram did not reveal any primary breast tumor. 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 07/27/2023: Bone scan: No evidence of skeletal metastasis   Treatment plan: Neoadjuvant chemotherapy with dose dense Adriamycin and Cytoxan x 4 followed by Taxol weekly x 12 Breast conserving surgery with targeted node dissection at [facility] Adjuvant radiation therapy Adjuvant antiestrogen therapy with CDK inhibitors ----------------------------------------------------------------------------------------------------------------------------------------------------- Current treatment: Cycle 3 dose dense Adriamycin and Cytoxan Chemo toxicities: Swelling and discomfort of the eyes Diarrhea Dysuria Nausea and vomiting starting from day 7 Mouth sores Chemo-induced anemia: Monitoring closely UTI after cycle 2: Treated with Macrobid We reduced the dosage of chemotherapy with cycle 2.   Her mother is in the hospital with liver mass and she does not want to receive any treatment for that.  She is struggling to accept her decision.  Return to clinic in 2 weeks for cycle 4

## 2023-09-12 NOTE — Progress Notes (Signed)
Patient Care Team: [name], MD as PCP - General (Internal Medicine) Serena Croissant, MD as Medical Oncologist (Hematology and Oncology) Pershing Proud, RN as Oncology Nurse Navigator Donnelly Angelica, RN as Oncology Nurse Navigator  DIAGNOSIS:  Encounter Diagnosis  Name Primary?   Breast cancer metastasized to axillary lymph node, left (HCC) Yes    SUMMARY OF ONCOLOGIC HISTORY: Oncology History  Breast cancer metastasized to axillary lymph node, left (HCC)  07/12/2023 Initial Diagnosis   Patient palpated a lump in the left axilla.  Mammogram did not reveal any primary breast tumor.  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+   08/15/2023 -  Chemotherapy   Patient is on Treatment Plan : BREAST ADJUVANT DOSE DENSE AC q14d / PACLitaxel q7d       CHIEF COMPLIANT: Cycle 4 AC  HISTORY OF PRESENT ILLNESS:   History of Present Illness   The patient, with a history of breast cancer on chemo with Adriamycin and Cytoxan, presents with a malfunctioning port, which has been an ongoing issue. She reports that the port is 'behind' her and 'misbehaving.'  The patient reports significant diarrhea, occurring approximately five times a day, even with the use of Imodium. She describes it as 'all day, every day,' and it is particularly triggered by eating. She also reports red eyes and occasional nausea, though the latter has improved.  The patient also discusses her mother's health, who recently left the hospital and is in need of palliative care. She reports that her mother is mostly bedridden, eating very little, and experiencing memory issues.         ALLERGIES:  is allergic to doxycycline, fish allergy, fish oil, other, pollen extract, citalopram, amoxicillin, penicillins, and strawberry extract.  MEDICATIONS:  Current Outpatient Medications  Medication Sig Dispense Refill   diphenoxylate-atropine (LOMOTIL) 2.5-0.025 MG tablet Take 1 tablet  by mouth 4 (four) times daily as needed for diarrhea or loose stools. 30 tablet 3   acetaminophen (TYLENOL 8 HOUR) 650 MG CR tablet Take 1 tablet (650 mg total) by mouth every 8 (eight) hours. 30 tablet 0   albuterol (PROAIR HFA) 108 (90 Base) MCG/ACT inhaler Use 2 puffs every 4 hours as needed for cough or wheeze.  May use 2 puffs 10-20 minutes prior to exercise. 1 Inhaler 0   benzonatate (TESSALON) 100 MG capsule Take 1 capsule (100 mg total) by mouth 3 (three) times daily as needed for cough. 20 capsule 0   cetirizine (ZYRTEC) 10 MG tablet Take 10 mg by mouth daily.     cyclobenzaprine (FLEXERIL) 10 MG tablet Take 10 mg by mouth 3 (three) times daily.     dexamethasone (DECADRON) 4 MG tablet Take 1 tablet day after chemo and 1 tablet 2 days after chemo with Adriamycin and Cytoxan 8 tablet 0   gabapentin (NEURONTIN) 100 MG capsule Take 1 capsule (100 mg total) by mouth 3 (three) times daily.     glipiZIDE (GLUCOTROL XL) 10 MG 24 hr tablet Take 10 mg by mouth daily with breakfast.     ibuprofen (ADVIL) 200 MG tablet Take 800 mg by mouth every 6 (six) hours as needed.     lidocaine (LIDODERM) 5 % Place 1 patch onto the skin daily. Remove & Discard patch within 12 hours or as directed by MD 30 patch 0   lidocaine-prilocaine (EMLA) cream Apply to affected area once 30 g 3   LINZESS 72 MCG capsule Take 72  mcg by mouth daily as needed.     losartan (COZAAR) 100 MG tablet Take 1 tablet (100 mg total) by mouth daily. 30 tablet 3   magic mouthwash (lidocaine, diphenhydrAMINE, alum & mag hydroxide) suspension Swish and swallow 5 mLs four times daily as needed for mouth pain. 360 mL 1   metFORMIN (GLUCOPHAGE) 1000 MG tablet Take 1,000 mg by mouth 2 (two) times daily with a meal.     metoprolol succinate (TOPROL-XL) 50 MG 24 hr tablet Take 50 mg by mouth daily.     montelukast (SINGULAIR) 10 MG tablet Take 10 mg by mouth at bedtime.     MOUNJARO 7.5 MG/0.5ML Pen SMARTSIG:7.5 Milligram(s) SUB-Q Once a Week      naproxen (NAPROSYN) 500 MG tablet Take 1 tablet (500 mg total) by mouth 2 (two) times daily. 30 tablet 0   oxyCODONE (ROXICODONE) 15 MG immediate release tablet Take 15 mg by mouth every 4 (four) hours as needed for pain.     phenazopyridine (PYRIDIUM) 200 MG tablet Take 1 tablet (200 mg total) by mouth 3 (three) times daily as needed for pain. 9 tablet 0   prochlorperazine (COMPAZINE) 10 MG tablet Take 1 tablet (10 mg total) by mouth every 6 (six) hours as needed for nausea or vomiting. 30 tablet 1   SUMAtriptan (IMITREX) 100 MG tablet Take 100 mg by mouth as directed.     topiramate (TOPAMAX) 50 MG tablet Take 50 mg by mouth at bedtime.     No current facility-administered medications for this visit.   Facility-Administered Medications Ordered in Other Visits  Medication Dose Route Frequency Provider Last Rate Last Admin   sodium chloride flush (NS) 0.9 % injection 10 mL  10 mL Intracatheter PRN Serena Croissant, MD   10 mL at 09/12/23 1600    PHYSICAL EXAMINATION: ECOG PERFORMANCE STATUS: 1 - Symptomatic but completely ambulatory  Vitals:   09/12/23 1322  BP: 117/70  Pulse: 60  Resp: 18  Temp: 97.9 F (36.6 C)  SpO2: 94%   Filed Weights   09/12/23 1322  Weight: (!) 422 lb 8 oz (191.6 kg)    Physical Exam   VITALS: SaO2- 90    LABORATORY DATA:  I have reviewed the data as listed    Latest Ref Rng & Units 09/12/2023    1:20 PM 09/02/2023    1:41 PM 08/29/2023    9:48 AM  CMP  Glucose 70 - 99 mg/dL 098  119  147   BUN 6 - 20 mg/dL 6  13  13    Creatinine 0.44 - 1.00 mg/dL 8.29  5.62  1.30   Sodium 135 - 145 mmol/L 140  138  140   Potassium 3.5 - 5.1 mmol/L 3.3  3.8  3.2   Chloride 98 - 111 mmol/L 104  105  104   CO2 22 - 32 mmol/L 27  27  26    Calcium 8.9 - 10.3 mg/dL 8.7  8.7  8.7   Total Protein 6.5 - 8.1 g/dL 6.4  6.5  6.6   Total Bilirubin <1.2 mg/dL 0.3  0.3  0.2   Alkaline Phos 38 - 126 U/L 113  202  95   AST 15 - 41 U/L 19  12  14    ALT 0 - 44 U/L 18  10  13       Lab Results  Component Value Date   WBC 7.1 09/12/2023   HGB 10.5 (L) 09/12/2023   HCT 33.9 (L) 09/12/2023  MCV 71.5 (L) 09/12/2023   PLT 294 09/12/2023   NEUTROABS 4.7 09/12/2023    ASSESSMENT & PLAN:  Breast cancer metastasized to axillary lymph node, left (HCC) 07/12/2023: Premenopausal patient palpated a lump in the left axilla. Mammogram did not reveal any primary breast tumor. 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 07/27/2023: Bone scan: No evidence of skeletal metastasis   Treatment plan: Neoadjuvant chemotherapy with dose dense Adriamycin and Cytoxan x 4 followed by Taxol weekly x 12 Breast conserving surgery with targeted node dissection at [facility] Adjuvant radiation therapy Adjuvant antiestrogen therapy with CDK inhibitors ----------------------------------------------------------------------------------------------------------------------------------------------------- Current treatment: Cycle 3 dose dense Adriamycin and Cytoxan Chemo toxicities: Swelling and discomfort of the eyes Diarrhea Dysuria Nausea and vomiting starting from day 7 Mouth sores Chemo-induced anemia: Monitoring closely UTI after cycle 2: Treated with Macrobid We reduced the dosage of chemotherapy with cycle 2.   Her mother is in the hospital with liver mass and she does not want to receive any treatment for that.  She is struggling to accept her decision.  Return to clinic in 2 weeks for cycle 4 ------------------------------------- Assessment and Plan    Port Access Issue Difficulty accessing port for chemotherapy administration. Likely due to a kink in the tubing. -Attempt to administer medication through port. -If unsuccessful, refer to radiology for repair.  Chemotherapy Side Effects Persistent diarrhea despite  Imodium use, red eyes, and nausea. -Continue Imodium as needed. -Prescribe stronger anti-diarrheal medication, to be taken every 6-8 hours as needed. -Continue current nausea management.  Breathlessness Experiencing shortness of breath on exertion. Oxygen saturation at 90%. -Monitor symptoms and oxygen saturation.  Cancer Treatment Currently on fourth cycle of Adriamycin and Cytoxan. Tumor has shrunk to half its original size. -Continue current chemotherapy regimen. -Discuss radiation therapy at next appointment on 09/26/2023.  Mother's Health Patient's mother in need of palliative care/hospice. -Document in chart and send message to mother's doctor.  Follow-up -Check labs to monitor white blood cell count, hemoglobin, and platelets. -Next appointment on 09/26/2023 to discuss next phase of treatment.          No orders of the defined types were placed in this encounter.  The patient has a good understanding of the overall plan. she agrees with it. she will call with any problems that may develop before the next visit here. Total time spent: 30 mins including face to face time and time spent for planning, charting and co-ordination of care   Tamsen Meek, MD 09/12/23

## 2023-09-12 NOTE — Patient Instructions (Signed)
[facility] - A DEPT OF [facility]  Discharge Instructions: Thank you for choosing [facility] to provide your oncology and hematology care.   If you have a lab appointment with the Cancer Center, please go directly to the Cancer Center and check in at the registration area.   Wear comfortable clothing and clothing appropriate for easy access to any Portacath or PICC line.   We strive to give you quality time with your provider. You may need to reschedule your appointment if you arrive late (15 or more minutes).  Arriving late affects you and other patients whose appointments are after yours.  Also, if you miss three or more appointments without notifying the office, you may be dismissed from the clinic at the provider's discretion.      For prescription refill requests, have your pharmacy contact our office and allow 72 hours for refills to be completed.    Today you received the following chemotherapy and/or immunotherapy agents Adriamycin, Cytoxan   To help prevent nausea and vomiting after your treatment, we encourage you to take your nausea medication as directed.  BELOW ARE SYMPTOMS THAT SHOULD BE REPORTED IMMEDIATELY: *FEVER GREATER THAN 100.4 F (38 C) OR HIGHER *CHILLS OR SWEATING *NAUSEA AND VOMITING THAT IS NOT CONTROLLED WITH YOUR NAUSEA MEDICATION *UNUSUAL SHORTNESS OF BREATH *UNUSUAL BRUISING OR BLEEDING *URINARY PROBLEMS (pain or burning when urinating, or frequent urination) *BOWEL PROBLEMS (unusual diarrhea, constipation, pain near the anus) TENDERNESS IN MOUTH AND THROAT WITH OR WITHOUT PRESENCE OF ULCERS (sore throat, sores in mouth, or a toothache) UNUSUAL RASH, SWELLING OR PAIN  UNUSUAL VAGINAL DISCHARGE OR ITCHING   Items with * indicate a potential emergency and should be followed up as soon as possible or go to the Emergency Department if any problems should occur.  Please show the CHEMOTHERAPY ALERT CARD or  IMMUNOTHERAPY ALERT CARD at check-in to the Emergency Department and triage nurse.  Should you have questions after your visit or need to cancel or reschedule your appointment, please contact [facility] - A DEPT OF [facility]  Dept: [phone]  and follow the prompts.  Office hours are 8:00 a.m. to 4:30 p.m. Monday - Friday. Please note that voicemails left after 4:00 p.m. may not be returned until the following business day.  We are closed weekends and major holidays. You have access to a nurse at all times for urgent questions. Please call the main number to the clinic Dept: [phone] and follow the prompts.   For any non-urgent questions, you may also contact your provider using MyChart. We now offer e-Visits for anyone 44 and older to request care online for non-urgent symptoms. For details visit [email or web address].   Also download the MyChart app! Go to the app store, search "MyChart", open the app, select , and log in with your MyChart username and password.  Cyclophosphamide Injection What is this medication? CYCLOPHOSPHAMIDE (sye kloe FOSS fa mide) treats some types of cancer. It works by slowing down the growth of cancer cells. This medicine may be used for other purposes; ask your health care provider or pharmacist if you have questions. COMMON BRAND NAME(S): Cyclophosphamide, Cytoxan, Neosar What should I tell my care team before I take this medication? They need to know if you have any of these conditions: Heart disease Irregular heartbeat or rhythm Infection Kidney problems Liver disease Low blood cell levels (white cells, platelets, or red blood cells) Lung  disease Previous radiation Trouble passing urine An unusual or allergic reaction to cyclophosphamide, other medications, foods, dyes, or preservatives Pregnant or trying to get pregnant Breast-feeding How should I use this medication? This medication is injected  into a vein. It is given by your care team in a hospital or clinic setting. Talk to your care team about the use of this medication in children. Special care may be needed. Overdosage: If you think you have taken too much of this medicine contact a poison control center or emergency room at once. NOTE: This medicine is only for you. Do not share this medicine with others. What if I miss a dose? Keep appointments for follow-up doses. It is important not to miss your dose. Call your care team if you are unable to keep an appointment. What may interact with this medication? Amphotericin B Amiodarone Azathioprine Certain antivirals for HIV or hepatitis Certain medications for blood pressure, such as enalapril, lisinopril, quinapril Cyclosporine Diuretics Etanercept Indomethacin Medications that relax muscles Metronidazole Natalizumab Tamoxifen Warfarin This list may not describe all possible interactions. Give your health care provider a list of all the medicines, herbs, non-prescription drugs, or dietary supplements you use. Also tell them if you smoke, drink alcohol, or use illegal drugs. Some items may interact with your medicine. What should I watch for while using this medication? This medication may make you feel generally unwell. This is not uncommon as chemotherapy can affect healthy cells as well as cancer cells. Report any side effects. Continue your course of treatment even though you feel ill unless your care team tells you to stop. You may need blood work while you are taking this medication. This medication may increase your risk of getting an infection. Call your care team for advice if you get a fever, chills, sore throat, or other symptoms of a cold or flu. Do not treat yourself. Try to avoid being around people who are sick. Avoid taking medications that contain aspirin, acetaminophen, ibuprofen, naproxen, or ketoprofen unless instructed by your care team. These medications  may hide a fever. Be careful brushing or flossing your teeth or using a toothpick because you may get an infection or bleed more easily. If you have any dental work done, tell your dentist you are receiving this medication. Drink water or other fluids as directed. Urinate often, even at night. Some products may contain alcohol. Ask your care team if this medication contains alcohol. Be sure to tell all care teams you are taking this medicine. Certain medicines, like metronidazole and disulfiram, can cause an unpleasant reaction when taken with alcohol. The reaction includes flushing, headache, nausea, vomiting, sweating, and increased thirst. The reaction can last from 30 minutes to several hours. Talk to your care team if you wish to become pregnant or think you might be pregnant. This medication can cause serious birth defects if taken during pregnancy and for 1 year after the last dose. A negative pregnancy test is required before starting this medication. A reliable form of contraception is recommended while taking this medication and for 1 year after the last dose. Talk to your care team about reliable forms of contraception. Do not father a child while taking this medication and for 4 months after the last dose. Use a condom during this time period. Do not breast-feed while taking this medication or for 1 week after the last dose. This medication may cause infertility. Talk to your care team if you are concerned about your fertility. Talk to your  care team about your risk of cancer. You may be more at risk for certain types of cancer if you take this medication. What side effects may I notice from receiving this medication? Side effects that you should report to your care team as soon as possible: Allergic reactions--skin rash, itching, hives, swelling of the face, lips, tongue, or throat Dry cough, shortness of breath or trouble breathing Heart failure--shortness of breath, swelling of the ankles,  feet, or hands, sudden weight gain, unusual weakness or fatigue Heart muscle inflammation--unusual weakness or fatigue, shortness of breath, chest pain, fast or irregular heartbeat, dizziness, swelling of the ankles, feet, or hands Heart rhythm changes--fast or irregular heartbeat, dizziness, feeling faint or lightheaded, chest pain, trouble breathing Infection--fever, chills, cough, sore throat, wounds that don't heal, pain or trouble when passing urine, general feeling of discomfort or being unwell Kidney injury--decrease in the amount of urine, swelling of the ankles, hands, or feet Liver injury--right upper belly pain, loss of appetite, nausea, light-colored stool, dark yellow or brown urine, yellowing skin or eyes, unusual weakness or fatigue Low red blood cell level--unusual weakness or fatigue, dizziness, headache, trouble breathing Low sodium level--muscle weakness, fatigue, dizziness, headache, confusion Red or dark brown urine Unusual bruising or bleeding Side effects that usually do not require medical attention (report to your care team if they continue or are bothersome): Hair loss Irregular menstrual cycles or spotting Loss of appetite Nausea Pain, redness, or swelling with sores inside the mouth or throat Vomiting This list may not describe all possible side effects. Call your doctor for medical advice about side effects. You may report side effects to FDA at 1-800-FDA-1088. Where should I keep my medication? This medication is given in a hospital or clinic. It will not be stored at home. NOTE: This sheet is a summary. It may not cover all possible information. If you have questions about this medicine, talk to your doctor, pharmacist, or health care provider.  2024 Elsevier/Gold Standard (2022-03-05 00:00:00)  Doxorubicin Injection What is this medication? DOXORUBICIN (dox oh ROO bi sin) treats some types of cancer. It works by slowing down the growth of cancer cells. This  medicine may be used for other purposes; ask your health care provider or pharmacist if you have questions. COMMON BRAND NAME(S): Adriamycin, Adriamycin PFS, Adriamycin RDF, Rubex What should I tell my care team before I take this medication? They need to know if you have any of these conditions: Heart disease History of low blood cell levels caused by a medication Liver disease Recent or ongoing radiation An unusual or allergic reaction to doxorubicin, other medications, foods, dyes, or preservatives If you or your partner are pregnant or trying to get pregnant Breast-feeding How should I use this medication? This medication is injected into a vein. It is given by your care team in a hospital or clinic setting. Talk to your care team about the use of this medication in children. Special care may be needed. Overdosage: If you think you have taken too much of this medicine contact a poison control center or emergency room at once. NOTE: This medicine is only for you. Do not share this medicine with others. What if I miss a dose? Keep appointments for follow-up doses. It is important not to miss your dose. Call your care team if you are unable to keep an appointment. What may interact with this medication? 6-mercaptopurine Paclitaxel Phenytoin St. John's wort Trastuzumab Verapamil This list may not describe all possible interactions. Give your  health care provider a list of all the medicines, herbs, non-prescription drugs, or dietary supplements you use. Also tell them if you smoke, drink alcohol, or use illegal drugs. Some items may interact with your medicine. What should I watch for while using this medication? Your condition will be monitored carefully while you are receiving this medication. You may need blood work while taking this medication. This medication may make you feel generally unwell. This is not uncommon as chemotherapy can affect healthy cells as well as cancer cells.  Report any side effects. Continue your course of treatment even though you feel ill unless your care team tells you to stop. There is a maximum amount of this medication you should receive throughout your life. The amount depends on the medical condition being treated and your overall health. Your care team will watch how much of this medication you receive. Tell your care team if you have taken this medication before. Your urine may turn red for a few days after your dose. This is not blood. If your urine is dark or brown, call your care team. In some cases, you may be given additional medications to help with side effects. Follow all directions for their use. This medication may increase your risk of getting an infection. Call your care team for advice if you get a fever, chills, sore throat, or other symptoms of a cold or flu. Do not treat yourself. Try to avoid being around people who are sick. This medication may increase your risk to bruise or bleed. Call your care team if you notice any unusual bleeding. Talk to your care team about your risk of cancer. You may be more at risk for certain types of cancers if you take this medication. Talk to your care team if you or your partner may be pregnant. Serious birth defects can occur if you take this medication during pregnancy and for 6 months after the last dose. Contraception is recommended while taking this medication and for 6 months after the last dose. Your care team can help you find the option that works for you. If your partner can get pregnant, use a condom while taking this medication and for 6 months after the last dose. Do not breastfeed while taking this medication. This medication may cause infertility. Talk to your care team if you are concerned about your fertility. What side effects may I notice from receiving this medication? Side effects that you should report to your care team as soon as possible: Allergic reactions--skin rash,  itching, hives, swelling of the face, lips, tongue, or throat Heart failure--shortness of breath, swelling of the ankles, feet, or hands, sudden weight gain, unusual weakness or fatigue Heart rhythm changes--fast or irregular heartbeat, dizziness, feeling faint or lightheaded, chest pain, trouble breathing Infection--fever, chills, cough, sore throat, wounds that don't heal, pain or trouble when passing urine, general feeling of discomfort or being unwell Low red blood cell level--unusual weakness or fatigue, dizziness, headache, trouble breathing Painful swelling, warmth, or redness of the skin, blisters or sores at the infusion site Unusual bruising or bleeding Side effects that usually do not require medical attention (report to your care team if they continue or are bothersome): Diarrhea Hair loss Nausea Pain, redness, or swelling with sores inside the mouth or throat Red urine This list may not describe all possible side effects. Call your doctor for medical advice about side effects. You may report side effects to FDA at 1-800-FDA-1088. Where should I keep my medication?  This medication is given in a hospital or clinic. It will not be stored at home. NOTE: This sheet is a summary. It may not cover all possible information. If you have questions about this medicine, talk to your doctor, pharmacist, or health care provider.  2024 Elsevier/Gold Standard (2023-01-20 00:00:00)

## 2023-09-12 NOTE — Progress Notes (Signed)
1300 Cathflo given due to port-a-cath not giving blood return after numerous saline flushes. Flushed well. Pt states she had trouble with this last time she had labs drawn.

## 2023-09-13 ENCOUNTER — Encounter: Payer: Self-pay | Admitting: Hematology and Oncology

## 2023-09-13 ENCOUNTER — Other Ambulatory Visit: Payer: Medicare HMO

## 2023-09-13 ENCOUNTER — Ambulatory Visit: Payer: Medicare HMO

## 2023-09-13 ENCOUNTER — Ambulatory Visit: Payer: Medicare HMO | Admitting: Hematology and Oncology

## 2023-09-14 ENCOUNTER — Inpatient Hospital Stay: Payer: Medicare HMO

## 2023-09-14 VITALS — BP 128/87 | HR 95 | Temp 98.6°F | Resp 18

## 2023-09-14 DIAGNOSIS — C50912 Malignant neoplasm of unspecified site of left female breast: Secondary | ICD-10-CM

## 2023-09-14 DIAGNOSIS — Z5111 Encounter for antineoplastic chemotherapy: Secondary | ICD-10-CM | POA: Diagnosis not present

## 2023-09-14 MED ORDER — PEGFILGRASTIM-JMDB 6 MG/0.6ML ~~LOC~~ SOSY
6.0000 mg | PREFILLED_SYRINGE | Freq: Once | SUBCUTANEOUS | Status: AC
  Administered 2023-09-14: 6 mg via SUBCUTANEOUS
  Filled 2023-09-14: qty 0.6

## 2023-09-15 ENCOUNTER — Ambulatory Visit: Payer: Medicare HMO

## 2023-09-20 ENCOUNTER — Encounter (HOSPITAL_COMMUNITY): Payer: Self-pay

## 2023-09-20 ENCOUNTER — Emergency Department (HOSPITAL_COMMUNITY)
Admission: EM | Admit: 2023-09-20 | Discharge: 2023-09-21 | Disposition: A | Discharge status: Home / Self Care | Payer: Medicare HMO | Attending: Emergency Medicine | Admitting: Emergency Medicine

## 2023-09-20 ENCOUNTER — Other Ambulatory Visit: Payer: Self-pay

## 2023-09-20 DIAGNOSIS — R221 Localized swelling, mass and lump, neck: Secondary | ICD-10-CM | POA: Diagnosis present

## 2023-09-20 DIAGNOSIS — C50919 Malignant neoplasm of unspecified site of unspecified female breast: Secondary | ICD-10-CM | POA: Insufficient documentation

## 2023-09-20 DIAGNOSIS — L0291 Cutaneous abscess, unspecified: Secondary | ICD-10-CM

## 2023-09-20 DIAGNOSIS — R0602 Shortness of breath: Secondary | ICD-10-CM | POA: Diagnosis not present

## 2023-09-20 DIAGNOSIS — I8290 Acute embolism and thrombosis of unspecified vein: Secondary | ICD-10-CM

## 2023-09-20 NOTE — ED Triage Notes (Signed)
Pt from Home - states she has been having left Neck pain for 2 days on the same side as her Port a cath with swelling.  Pt also c/o SOB.  Pt has port d/t Breast CA - has last treatment on 11/11 and next chemo is scheduled on 11/25.

## 2023-09-21 ENCOUNTER — Emergency Department (HOSPITAL_COMMUNITY): Payer: Medicare HMO

## 2023-09-21 ENCOUNTER — Encounter (HOSPITAL_COMMUNITY): Payer: Self-pay

## 2023-09-21 DIAGNOSIS — R221 Localized swelling, mass and lump, neck: Secondary | ICD-10-CM | POA: Diagnosis not present

## 2023-09-21 HISTORY — PX: IR US SOFT TISSUE NECK: IMG5662

## 2023-09-21 LAB — BASIC METABOLIC PANEL
Anion gap: 9 (ref 5–15)
BUN: 6 mg/dL (ref 6–20)
CO2: 25 mmol/L (ref 22–32)
Calcium: 8.3 mg/dL — ABNORMAL LOW (ref 8.9–10.3)
Chloride: 101 mmol/L (ref 98–111)
Creatinine, Ser: 0.66 mg/dL (ref 0.44–1.00)
GFR, Estimated: >60 mL/min (ref >60)
Glucose, Bld: 206 mg/dL — ABNORMAL HIGH (ref 70–99)
Potassium: 3.1 mmol/L — ABNORMAL LOW (ref 3.5–5.1)
Sodium: 135 mmol/L (ref 135–145)

## 2023-09-21 LAB — CBC WITH DIFFERENTIAL/PLATELET
Abs Immature Granulocytes: 0.11 10*3/uL — ABNORMAL HIGH (ref 0.00–0.07)
Basophils Absolute: 0.1 10*3/uL (ref 0.0–0.1)
Basophils Relative: 1 %
Eosinophils Absolute: 0 10*3/uL (ref 0.0–0.5)
Eosinophils Relative: 0 %
HCT: 26.5 % — ABNORMAL LOW (ref 36.0–46.0)
Hemoglobin: 8.4 g/dL — ABNORMAL LOW (ref 12.0–15.0)
Immature Granulocytes: 1 %
Lymphocytes Relative: 13 %
Lymphs Abs: 1.1 10*3/uL (ref 0.7–4.0)
MCH: 22.8 pg — ABNORMAL LOW (ref 26.0–34.0)
MCHC: 31.7 g/dL (ref 30.0–36.0)
MCV: 71.8 fL — ABNORMAL LOW (ref 80.0–100.0)
Monocytes Absolute: 0.5 10*3/uL (ref 0.1–1.0)
Monocytes Relative: 6 %
Neutro Abs: 6.7 10*3/uL (ref 1.7–7.7)
Neutrophils Relative %: 79 %
Platelets: 186 10*3/uL (ref 150–400)
RBC: 3.69 MIL/uL — ABNORMAL LOW (ref 3.87–5.11)
RDW: 21.4 % — ABNORMAL HIGH (ref 11.5–15.5)
WBC: 8.5 10*3/uL (ref 4.0–10.5)
nRBC: 0 % (ref 0.0–0.2)

## 2023-09-21 MED ORDER — SULFAMETHOXAZOLE-TRIMETHOPRIM 800-160 MG PO TABS: 1.0000 | ORAL_TABLET | Freq: Two times a day (BID) | ORAL | 0 refills | Status: AC

## 2023-09-21 MED ORDER — LIDOCAINE HCL 1 % IJ SOLN
INTRAMUSCULAR | Status: AC
  Filled 2023-09-21: qty 20

## 2023-09-21 MED ORDER — CEPHALEXIN 500 MG PO CAPS: 500.0000 mg | ORAL_CAPSULE | Freq: Four times a day (QID) | ORAL | 0 refills | Status: DC

## 2023-09-21 MED ORDER — PROCHLORPERAZINE EDISYLATE 10 MG/2ML IJ SOLN
5.0000 mg | Freq: Once | INTRAMUSCULAR | Status: AC
  Administered 2023-09-21: 5 mg via INTRAVENOUS
  Filled 2023-09-21: qty 2

## 2023-09-21 MED ORDER — RIVAROXABAN (XARELTO) VTE STARTER PACK (15 & 20 MG): ORAL_TABLET | ORAL | 0 refills | Status: DC

## 2023-09-21 MED ORDER — IOHEXOL 300 MG/ML  SOLN
75.0000 mL | Freq: Once | INTRAMUSCULAR | Status: AC | PRN
  Administered 2023-09-21: 75 mL via INTRAVENOUS

## 2023-09-21 NOTE — ED Provider Notes (Addendum)
  Physical Exam  BP (!) 120/53 (BP Location: Left Wrist)   Pulse 90   Temp 98.1 F (36.7 C) (Oral)   Resp 18   Ht 5\' 9"  (1.753 m)   Wt (!) 191.6 kg   LMP 08/20/2023   SpO2 99%   BMI 62.39 kg/m   Physical Exam  Procedures  Procedures  ED Course / MDM    Medical Decision Making Amount and/or Complexity of Data Reviewed Labs: ordered. Radiology: ordered.  Risk Prescription drug management.   Patient with catheter abnormality.  Has Port-A-Cath placed.  Does have hematoma.  White count normal.  Discussed with Dr. Greggory Stallion from IR.  Will likely aspirate.  Potentially discharge home.  Discussed with Dr. Archer Asa.  Has clot in her internal jugular.  However line is still patent.  It is not hematoma externally or abscess.  Recommends anticoagulation.  Line can still be used.  Will discuss with oncology.  Have attempted to contact Dr. Ave Filter, however have not heard back.  Also have called on-call provider.  Care turned over to oncoming provider.  Discussed with Dr. Ave Filter.  Will start Xarelto.    Of note patient also states she has not abscess forming right above her vagina.  Patient does not want me to look at it, but asked for antibiotics.  I think it is reasonable to treat with her comorbidities.   Discussed with patient.  Has had just hives with penicillin in past.  No difficulty breathing and not acute reaction.  Will give Keflex and Bactrim.     Benjiman Core, MD 09/21/23 1518    Benjiman Core, MD 09/21/23 1534

## 2023-09-21 NOTE — H&P (Addendum)
Chief Complaint: Patient was seen in consultation today for malpositioned Port-A-cath and possible neck fluid collection aspiration Chief Complaint  Patient presents with   Vascular Access Problem   at the request of Dr. Earna Coder, MD  Supervising Physician: Malachy Moan  Patient Status: [name]  History of Present Illness: [name] is a 47 y.o. female   FULL Code status per patient and chart.  Patient is known to IR. She underwent successful port-A-cath placement on 08/08/23 by Dr. Grace Isaac.  Patient presents to the emerged part with complaints of some pain and swelling in the right side of her neck.  Her Port-A-Cath was used on October 11 without difficulty. Imaging today, however, reveals that there is a new bander kink in the line and there is a structure adjacent to the catheter on the right that could be hematoma versus abscess.  No overlying skin changes to suggest infection.  CXR 11/20: IMPRESSION: Right Port-A-Cath catheter loops in the right upper chest. Tip is in the SVC.   CT neck w/ contrast 11/20: IMPRESSION: 1. Evaluation is limited by body habitus, but a structure lateral to the right IJ or connected to it appears to have surrounding fat stranding, which could represent a hematoma or abscess. Correlate with physical exam and consider ultrasound for further evaluation. 2. Right subclavian approach catheter appears to be looped or kinked, which is new compared to the 09/02/2023 radiograph. Correlate with physical exam. 3. Prominent right level three lymph node measures up to 6 mm in short axis, which is nonspecific, favored to be reactive.   Dr. Blinda Leatherwood consulted with Dr. Milford Cage, on-call for interventional radiology, who approved the procedure.  Of note, patient is NPO as of 6pm last night. She is grieving the loss of her mother 2 days ago. She states she is experiencing a rash and skin infection in bilateral groin currently.  Past Medical  History:  Diagnosis Date   Allergic rhinitis    Anemia    Anxiety disorder    Asthma    Benign essential hypertension    Depression    Diabetes mellitus without complication (HCC)    Edema    History of blood clots    Hyperglycemia    Lumbar radiculopathy    Metrorrhagia    Morbid obesity (HCC)    OSA (obstructive sleep apnea)    Sickle cell trait (HCC)    Snoring     Past Surgical History:  Procedure Laterality Date   CESAREAN SECTION     CHOLECYSTECTOMY     IR IMAGING GUIDED PORT INSERTION  08/08/2023   TUBAL LIGATION      Allergies: Doxycycline, Fish allergy, Fish oil, Other, Pollen extract, Citalopram, Amoxicillin, Penicillins, and Strawberry extract  Medications: Prior to Admission medications   Medication Sig Start Date End Date Taking? Authorizing Provider  albuterol (PROAIR HFA) 108 (90 Base) MCG/ACT inhaler Use 2 puffs every 4 hours as needed for cough or wheeze.  May use 2 puffs 10-20 minutes prior to exercise. 02/09/16  Yes Bardelas, Bonnita Hollow, MD  benzonatate (TESSALON) 100 MG capsule Take 1 capsule (100 mg total) by mouth 3 (three) times daily as needed for cough. 09/02/23  Yes Walisiewicz, Kaitlyn E, PA-C  cetirizine (ZYRTEC) 10 MG tablet Take 10 mg by mouth daily.   Yes [provider]  dexamethasone (DECADRON) 4 MG tablet Take 1 tablet day after chemo and 1 tablet 2 days after chemo with Adriamycin and Cytoxan 09/02/23  Yes Serena Croissant,  MD  diphenoxylate-atropine (LOMOTIL) 2.5-0.025 MG tablet Take 1 tablet by mouth 4 (four) times daily as needed for diarrhea or loose stools. 09/12/23  Yes Serena Croissant, MD  gabapentin (NEURONTIN) 100 MG capsule Take 1 capsule (100 mg total) by mouth 3 (three) times daily. 06/13/20  Yes Serena Croissant, MD  glipiZIDE (GLUCOTROL XL) 10 MG 24 hr tablet Take 10 mg by mouth daily with breakfast.   Yes [provider]  ibuprofen (ADVIL) 200 MG tablet Take 800 mg by mouth every 6 (six) hours as needed.   Yes [provider]  lidocaine (LIDODERM) 5 % Place 1 patch onto the skin daily. Remove & Discard patch within 12 hours or as directed by MD 05/01/21  Yes Derwood Kaplan, MD  lidocaine-prilocaine (EMLA) cream Apply to affected area once 08/15/23  Yes Causey, Larna Daughters, NP  losartan (COZAAR) 100 MG tablet Take 1 tablet (100 mg total) by mouth daily. 02/02/13  Yes Leroy Sea, MD  magic mouthwash (lidocaine, diphenhydrAMINE, alum & mag hydroxide) suspension Swish and swallow 5 mLs four times daily as needed for mouth pain. 08/23/23  Yes Walisiewicz, Kaitlyn E, PA-C  metoprolol succinate (TOPROL-XL) 50 MG 24 hr tablet Take 50 mg by mouth daily. 02/16/21  Yes [provider]  montelukast (SINGULAIR) 10 MG tablet Take 10 mg by mouth at bedtime.   Yes [provider]  MOUNJARO 10 MG/0.5ML Pen Inject 10 mg into the skin once a week. 09/05/23  Yes [provider]  Oxycodone HCl 20 MG TABS Take 1 tablet by mouth 4 (four) times daily as needed. 09/05/23  Yes [provider]  phenazopyridine (PYRIDIUM) 200 MG tablet Take 1 tablet (200 mg total) by mouth 3 (three) times daily as needed for pain. 08/23/23  Yes Walisiewicz, Kaitlyn E, PA-C  prochlorperazine (COMPAZINE) 10 MG tablet Take 1 tablet (10 mg total) by mouth every 6 (six) hours as needed for nausea or vomiting. 07/19/23  Yes Serena Croissant, MD  acetaminophen (TYLENOL 8 HOUR) 650 MG CR tablet Take 1 tablet (650 mg total) by mouth every 8 (eight) hours. Patient not taking: Reported on 09/21/2023 05/01/21   Derwood Kaplan, MD  cyclobenzaprine (FLEXERIL) 10 MG tablet Take 10 mg by mouth 3 (three) times daily.    [provider]  LINZESS 72 MCG capsule Take 72 mcg by mouth daily as needed. Patient not taking: Reported on 09/21/2023 01/15/21   [provider]  metFORMIN (GLUCOPHAGE) 1000 MG tablet Take 1,000 mg by mouth 2 (two) times daily with a meal. Patient not taking: Reported on 09/21/2023    [provider]  naproxen (NAPROSYN) 500 MG tablet Take 1 tablet (500 mg total) by mouth 2 (two) times daily. Patient not taking: Reported on 09/21/2023 05/01/21   Derwood Kaplan, MD  SUMAtriptan (IMITREX) 100 MG tablet Take 100 mg by mouth as directed. Patient not taking: Reported on 09/21/2023 03/16/21   [provider]  topiramate (TOPAMAX) 50 MG tablet Take 50 mg by mouth at bedtime. Patient not taking: Reported on 09/21/2023 01/19/21   [provider]     Family History  Problem Relation Age of Onset   Asthma Mother    Hypertension Mother    Leukemia Father    Diabetes Father    Breast cancer Maternal Aunt    Breast cancer Paternal Aunt    Breast cancer Cousin    Diabetes Brother     Social History   Socioeconomic History   Marital status:  Single    Spouse name: Not on file   Number of children: 3   Years of education: 36   Highest education level: Not on file  Occupational History   Not on file  Tobacco Use   Smoking status: Never   Smokeless tobacco: Never  Vaping Use   Vaping status: Never Used  Substance and Sexual Activity   Alcohol use: Not Currently   Drug use: No   Sexual activity: Yes    Partners: Male    Birth control/protection: Surgical  Other Topics Concern   Not on file  Social History Narrative   Patient is single and lives with her children.   Patient has three children.   Patient is currently unemployed.   Patient is a Holiday representative in college now.   Patient is right-handed.   Patient drinks one cup of tea every other day.   Social Determinants of Health   Financial Resource Strain: Not on file  Food Insecurity: Not on file  Transportation Needs: Not on file  Physical Activity: Not on file  Stress: Not on file  Social Connections: Unknown (06/07/2023)   Received from [facility]   Social Network    Social Network: Not on file    Review of Systems: A 12 point ROS discussed and pertinent positives are indicated in the HPI  above.  All other systems are negative.  Review of Systems  Constitutional:  Negative for activity change, appetite change, diaphoresis and fever.  Respiratory:  Positive for cough. Negative for chest tightness, shortness of breath and wheezing.   Cardiovascular:  Negative for chest pain.  Skin:  Positive for rash.       Bilateral groin  Psychiatric/Behavioral:  Negative for behavioral problems and confusion.     Vital Signs: BP 119/65 (BP Location: Left Wrist)   Pulse 84   Temp 97.8 F (36.6 C)   Resp 14   Ht 5\' 9"  (1.753 m)   Wt (!) 422 lb 8 oz (191.6 kg)   LMP 08/20/2023   SpO2 99%   BMI 62.39 kg/m     Physical Exam Constitutional:      General: She is not in acute distress.    Appearance: Normal appearance. She is obese.  HENT:     Mouth/Throat:     Mouth: Mucous membranes are dry.  Cardiovascular:     Rate and Rhythm: Normal rate and regular rhythm.     Heart sounds: No murmur heard. Pulmonary:     Effort: Pulmonary effort is normal. No respiratory distress.     Breath sounds: Normal breath sounds.  Abdominal:     Palpations: Abdomen is soft.  Musculoskeletal:        General: Normal range of motion.     Cervical back: Normal range of motion. Tenderness present.  Skin:    General: Skin is warm.     Findings: Rash present.     Comments: Bilateral inguinal candidal intertrigo   Neurological:     Mental Status: She is alert and oriented to person, place, and time.  Psychiatric:        Mood and Affect: Mood normal.        Behavior: Behavior normal.        Thought Content: Thought content normal.        Judgment: Judgment normal.     Imaging: CT Soft Tissue Neck W Contrast  Result Date: 09/21/2023 CLINICAL DATA:  Right neck pain and swelling for 2 days, right chest  Port-A-Cath EXAM: CT NECK WITH CONTRAST TECHNIQUE: Multidetector CT imaging of the neck was performed using the standard protocol following the bolus administration of intravenous contrast.  RADIATION DOSE REDUCTION: This exam was performed according to the departmental dose-optimization program which includes automated exposure control, adjustment of the mA and/or kV according to patient size and/or use of iterative reconstruction technique. CONTRAST:  75mL OMNIPAQUE IOHEXOL 300 MG/ML  SOLN COMPARISON:  None Available. FINDINGS: Evaluation is limited by body habitus. Pharynx and larynx: Normal. No mass or swelling. Salivary glands: No inflammation, mass, or stone. Thyroid: Normal. Lymph nodes: Prominent right level three lymph node measures up to 6 mm in short axis. Vascular: The arteries are grossly patent. Evaluation of the right IJ is limited by multifactorial beam hardening artifact and body habitus, but a structure lateral to the right IJ or connected to it (series 3, image 63 appears to have surrounding fat stranding (series 3, image 67). This structure measures up to 3.2 x 2.8 x 6.7 cm. The more distal right IJ peers patent. Right subclavian approach Port-A-Cath appears to be looped or kinked (series 5, image 55 series 1, image 1), which is new compared to the 09/02/2023 radiograph. Limited intracranial: Negative. Visualized orbits: Limited inclusion in the field of view is unremarkable. Mastoids and visualized paranasal sinuses: Mucosal thickening in the ethmoid air cells. Otherwise clear paranasal sinuses. Trace fluid in the right mastoid air cells. Skeleton: No acute osseous abnormality. Upper chest: No focal pulmonary opacity or pleural effusion. IMPRESSION: 1. Evaluation is limited by body habitus, but a structure lateral to the right IJ or connected to it appears to have surrounding fat stranding, which could represent a hematoma or abscess. Correlate with physical exam and consider ultrasound for further evaluation. 2. Right subclavian approach catheter appears to be looped or kinked, which is new compared to the 09/02/2023 radiograph. Correlate with physical exam. 3. Prominent right  level three lymph node measures up to 6 mm in short axis, which is nonspecific, favored to be reactive. Electronically Signed   By: Wiliam Ke M.D.   On: 09/21/2023 02:40   DG Chest 2 View  Result Date: 09/21/2023 CLINICAL DATA:  Cough EXAM: CHEST - 2 VIEW COMPARISON:  09/02/2023 FINDINGS: Right Port-A-Cath in place. The catheter loops in the right upper chest, new since prior study. Tip is in the SVC. Heart and mediastinal contours are within normal limits. No focal opacities or effusions. No acute bony abnormality. IMPRESSION: Right Port-A-Cath catheter loops in the right upper chest. Tip is in the SVC. No active cardiopulmonary disease. Electronically Signed   By: Charlett Nose M.D.   On: 09/21/2023 01:54   DG Chest 2 View  Result Date: 09/02/2023 CLINICAL DATA:  Cough, shortness of breath, chest pain, history of breast cancer EXAM: CHEST - 2 VIEW COMPARISON:  11/20/2021 FINDINGS: Limited exam because of technique and body habitus. Right IJ power port catheter tip SVC RA junction. Stable heart size and vascularity. No definite focal pneumonia, collapse or consolidation. Negative for edema, large effusion or pneumothorax. Trachea midline. IMPRESSION: Limited exam. No acute chest process by plain radiography. Electronically Signed   By: Judie Petit.  Shick M.D.   On: 09/02/2023 13:04    Labs:  CBC: Recent Labs    08/29/23 0945 09/02/23 1341 09/12/23 1320 09/21/23 0022  WBC 11.3* 64.4* 7.1 8.5  HGB 10.5* 10.0* 10.5* 8.4*  HCT 33.5* 31.1* 33.9* 26.5*  PLT 340 323 294 186    COAGS: No results for input(s): "INR", "  APTT" in the last 8760 hours.  BMP: Recent Labs    08/29/23 0948 09/02/23 1341 09/12/23 1320 09/21/23 0022  NA 140 138 140 135  K 3.2* 3.8 3.3* 3.1*  CL 104 105 104 101  CO2 26 27 27 25   GLUCOSE 203* 167* 195* 206*  BUN 13 13 6 6   CALCIUM 8.7* 8.7* 8.7* 8.3*  CREATININE 0.92 0.72 0.90 0.66  GFRNONAA >60 >60 >60 >60    LIVER FUNCTION TESTS: Recent Labs     08/23/23 1228 08/29/23 0948 09/02/23 1341 09/12/23 1320  BILITOT 0.4 0.2* 0.3 0.3  AST 10* 14* 12* 19  ALT 9 13 10 18   ALKPHOS 123 95 202* 113  PROT 7.3 6.6 6.5 6.4*  ALBUMIN 3.6 3.4* 3.4* 3.4*    TUMOR MARKERS: No results for input(s): "AFPTM", "CEA", "CA199", "CHROMGRNA" in the last 8760 hours.  Assessment and Plan:  Patient presents for Port-A-Cath manipulation and possible neck fluid collection aspiration. Risks and benefits of image guided port-a-catheter manipulation was discussed with the patient including, but not limited to bleeding, infection, or fibrin sheath development and need for additional procedures.  Risks and benefits of neck fluid collection aspiration was discussed with the patient and/or patient's family including, but not limited to bleeding, infection, damage to adjacent structures, or low yield requiring additional tests.  All of the questions were answered and there is agreement to proceed.  Consent signed and in chart.    Thank you for this interesting consult.  I greatly enjoyed meeting Helena M Catena and look forward to participating in their care.  A copy of this report was sent to the requesting provider on this date.  Electronically Signed: Sable Feil, PA-C 09/21/2023, 10:07 AM   I spent a total of    25 Minutes in face to face in clinical consultation, greater than 50% of which was counseling/coordinating care for Port-A-Cath manipulation and possible neck fluid collection aspiration.

## 2023-09-21 NOTE — ED Provider Notes (Signed)
[place] EMERGENCY DEPARTMENT AT [facility] Provider Note   CSN: [id number] Arrival date & time: 09/20/23  2148     History  Chief Complaint  Patient presents with   Vascular Access Problem    [name] is a 47 y.o. female.  Patient presents to the emergency department for evaluation of pain and some swelling on the right side of her neck.  Patient has a port that was placed by interventional radiology on October 7 which is currently being used for chemotherapy for treatment of breast cancer.  Last treatment on October 11, scheduled for additional treatment on October 25.       Home Medications Prior to Admission medications   Medication Sig Start Date End Date Taking? Authorizing Provider  acetaminophen (TYLENOL 8 HOUR) 650 MG CR tablet Take 1 tablet (650 mg total) by mouth every 8 (eight) hours. 05/01/21   Derwood Kaplan, MD  albuterol (PROAIR HFA) 108 (90 Base) MCG/ACT inhaler Use 2 puffs every 4 hours as needed for cough or wheeze.  May use 2 puffs 10-20 minutes prior to exercise. 02/09/16   Fletcher Anon, MD  benzonatate (TESSALON) 100 MG capsule Take 1 capsule (100 mg total) by mouth 3 (three) times daily as needed for cough. 09/02/23   Walisiewicz, Yvonna Alanis E, PA-C  cetirizine (ZYRTEC) 10 MG tablet Take 10 mg by mouth daily.    [provider]  cyclobenzaprine (FLEXERIL) 10 MG tablet Take 10 mg by mouth 3 (three) times daily.    [provider]  dexamethasone (DECADRON) 4 MG tablet Take 1 tablet day after chemo and 1 tablet 2 days after chemo with Adriamycin and Cytoxan 09/02/23   Serena Croissant, MD  diphenoxylate-atropine (LOMOTIL) 2.5-0.025 MG tablet Take 1 tablet by mouth 4 (four) times daily as needed for diarrhea or loose stools. 09/12/23   Serena Croissant, MD  gabapentin (NEURONTIN) 100 MG capsule Take 1 capsule (100 mg total) by mouth 3 (three) times daily. 06/13/20   Serena Croissant, MD  glipiZIDE (GLUCOTROL XL) 10 MG 24 hr tablet Take 10  mg by mouth daily with breakfast.    [provider]  ibuprofen (ADVIL) 200 MG tablet Take 800 mg by mouth every 6 (six) hours as needed.    [provider]  lidocaine (LIDODERM) 5 % Place 1 patch onto the skin daily. Remove & Discard patch within 12 hours or as directed by MD 05/01/21   Derwood Kaplan, MD  lidocaine-prilocaine (EMLA) cream Apply to affected area once 08/15/23   Loa Socks, NP  LINZESS 72 MCG capsule Take 72 mcg by mouth daily as needed. 01/15/21   [provider]  losartan (COZAAR) 100 MG tablet Take 1 tablet (100 mg total) by mouth daily. 02/02/13   Leroy Sea, MD  magic mouthwash (lidocaine, diphenhydrAMINE, alum & mag hydroxide) suspension Swish and swallow 5 mLs four times daily as needed for mouth pain. 08/23/23   Walisiewicz, Yvonna Alanis E, PA-C  metFORMIN (GLUCOPHAGE) 1000 MG tablet Take 1,000 mg by mouth 2 (two) times daily with a meal.    [provider]  metoprolol succinate (TOPROL-XL) 50 MG 24 hr tablet Take 50 mg by mouth daily. 02/16/21   [provider]  montelukast (SINGULAIR) 10 MG tablet Take 10 mg by mouth at bedtime.    [provider]  MOUNJARO 7.5 MG/0.5ML Pen SMARTSIG:7.5 Milligram(s) SUB-Q Once a Week 06/11/23   [provider]  naproxen (NAPROSYN) 500 MG tablet Take 1 tablet (  500 mg total) by mouth 2 (two) times daily. 05/01/21   Derwood Kaplan, MD  oxyCODONE (ROXICODONE) 15 MG immediate release tablet Take 15 mg by mouth every 4 (four) hours as needed for pain.    [provider]  phenazopyridine (PYRIDIUM) 200 MG tablet Take 1 tablet (200 mg total) by mouth 3 (three) times daily as needed for pain. 08/23/23   Shanon Ace, PA-C  prochlorperazine (COMPAZINE) 10 MG tablet Take 1 tablet (10 mg total) by mouth every 6 (six) hours as needed for nausea or vomiting. 07/19/23   Serena Croissant, MD  SUMAtriptan (IMITREX) 100 MG tablet Take 100 mg by mouth as directed. 03/16/21    [provider]  topiramate (TOPAMAX) 50 MG tablet Take 50 mg by mouth at bedtime. 01/19/21   [provider]      Allergies    Doxycycline, Fish allergy, Fish oil, Other, Pollen extract, Citalopram, Amoxicillin, Penicillins, and Strawberry extract    Review of Systems   Review of Systems  Physical Exam Updated Vital Signs BP 96/75 (BP Location: Left Wrist)   Pulse 90   Temp 97.7 F (36.5 C) (Oral)   Resp 14   Ht 5\' 9"  (1.753 m)   Wt (!) 191.6 kg   LMP 08/20/2023   SpO2 99%   BMI 62.39 kg/m  Physical Exam Vitals and nursing note reviewed.  Constitutional:      General: She is not in acute distress.    Appearance: She is well-developed.  HENT:     Head: Normocephalic and atraumatic.     Mouth/Throat:     Mouth: Mucous membranes are moist.  Eyes:     General: Vision grossly intact. Gaze aligned appropriately.     Extraocular Movements: Extraocular movements intact.     Conjunctiva/sclera: Conjunctivae normal.  Neck:     Comments: fullness and tenderness  Cardiovascular:     Rate and Rhythm: Normal rate and regular rhythm.     Pulses: Normal pulses.     Heart sounds: Normal heart sounds, S1 normal and S2 normal. No murmur heard.    No friction rub. No gallop.  Pulmonary:     Effort: Pulmonary effort is normal. No respiratory distress.     Breath sounds: Normal breath sounds.  Abdominal:     General: Bowel sounds are normal.     Palpations: Abdomen is soft.     Tenderness: There is no abdominal tenderness. There is no guarding or rebound.     Hernia: No hernia is present.  Musculoskeletal:        General: No swelling.     Cervical back: Full passive range of motion without pain, normal range of motion and neck supple. No spinous process tenderness or muscular tenderness. Normal range of motion.     Right lower leg: No edema.     Left lower leg: No edema.  Skin:    General: Skin is warm and dry.     Capillary Refill: Capillary refill takes less  than 2 seconds.     Findings: No ecchymosis, erythema, rash or wound.  Neurological:     General: No focal deficit present.     Mental Status: She is alert and oriented to person, place, and time.     GCS: GCS eye subscore is 4. GCS verbal subscore is 5. GCS motor subscore is 6.     Cranial Nerves: Cranial nerves 2-12 are intact.     Sensory: Sensation is intact.  Motor: Motor function is intact.     Coordination: Coordination is intact.  Psychiatric:        Attention and Perception: Attention normal.        Mood and Affect: Mood normal.        Speech: Speech normal.        Behavior: Behavior normal.     ED Results / Procedures / Treatments   Labs (all labs ordered are listed, but only abnormal results are displayed) Labs Reviewed  CBC WITH DIFFERENTIAL/PLATELET - Abnormal; Notable for the following components:      Result Value   RBC 3.69 (*)    Hemoglobin 8.4 (*)    HCT 26.5 (*)    MCV 71.8 (*)    MCH 22.8 (*)    RDW 21.4 (*)    Abs Immature Granulocytes 0.11 (*)    All other components within normal limits  BASIC METABOLIC PANEL - Abnormal; Notable for the following components:   Potassium 3.1 (*)    Glucose, Bld 206 (*)    Calcium 8.3 (*)    All other components within normal limits    EKG None  Radiology CT Soft Tissue Neck W Contrast  Result Date: 09/21/2023 CLINICAL DATA:  Right neck pain and swelling for 2 days, right chest Port-A-Cath EXAM: CT NECK WITH CONTRAST TECHNIQUE: Multidetector CT imaging of the neck was performed using the standard protocol following the bolus administration of intravenous contrast. RADIATION DOSE REDUCTION: This exam was performed according to the departmental dose-optimization program which includes automated exposure control, adjustment of the mA and/or kV according to patient size and/or use of iterative reconstruction technique. CONTRAST:  75mL OMNIPAQUE IOHEXOL 300 MG/ML  SOLN COMPARISON:  None Available. FINDINGS: Evaluation  is limited by body habitus. Pharynx and larynx: Normal. No mass or swelling. Salivary glands: No inflammation, mass, or stone. Thyroid: Normal. Lymph nodes: Prominent right level three lymph node measures up to 6 mm in short axis. Vascular: The arteries are grossly patent. Evaluation of the right IJ is limited by multifactorial beam hardening artifact and body habitus, but a structure lateral to the right IJ or connected to it (series 3, image 63 appears to have surrounding fat stranding (series 3, image 67). This structure measures up to 3.2 x 2.8 x 6.7 cm. The more distal right IJ peers patent. Right subclavian approach Port-A-Cath appears to be looped or kinked (series 5, image 55 series 1, image 1), which is new compared to the 09/02/2023 radiograph. Limited intracranial: Negative. Visualized orbits: Limited inclusion in the field of view is unremarkable. Mastoids and visualized paranasal sinuses: Mucosal thickening in the ethmoid air cells. Otherwise clear paranasal sinuses. Trace fluid in the right mastoid air cells. Skeleton: No acute osseous abnormality. Upper chest: No focal pulmonary opacity or pleural effusion. IMPRESSION: 1. Evaluation is limited by body habitus, but a structure lateral to the right IJ or connected to it appears to have surrounding fat stranding, which could represent a hematoma or abscess. Correlate with physical exam and consider ultrasound for further evaluation. 2. Right subclavian approach catheter appears to be looped or kinked, which is new compared to the 09/02/2023 radiograph. Correlate with physical exam. 3. Prominent right level three lymph node measures up to 6 mm in short axis, which is nonspecific, favored to be reactive. Electronically Signed   By: Wiliam Ke M.D.   On: 09/21/2023 02:40   DG Chest 2 View  Result Date: 09/21/2023 CLINICAL DATA:  Cough EXAM: CHEST - 2  VIEW COMPARISON:  09/02/2023 FINDINGS: Right Port-A-Cath in place. The catheter loops in the right  upper chest, new since prior study. Tip is in the SVC. Heart and mediastinal contours are within normal limits. No focal opacities or effusions. No acute bony abnormality. IMPRESSION: Right Port-A-Cath catheter loops in the right upper chest. Tip is in the SVC. No active cardiopulmonary disease. Electronically Signed   By: Charlett Nose M.D.   On: 09/21/2023 01:54    Procedures Procedures    Medications Ordered in ED Medications  iohexol (OMNIPAQUE) 300 MG/ML solution 75 mL (75 mLs Intravenous Contrast Given 09/21/23 0209)    ED Course/ Medical Decision Making/ A&P                                 Medical Decision Making Amount and/or Complexity of Data Reviewed Labs: ordered. Radiology: ordered.  Risk Prescription drug management.   Patient presents to the emerged part with complaints of some pain and swelling in the right side of her neck.  Patient had a Port-A-Cath placed by interventional radiology on October 7.  No complications at the time of placement, it was used on October 11 without difficulty.  Imaging today, however, reveals that there is a new bander kink in the line and there is a structure adjacent to the catheter on the right that could be hematoma versus abscess.  No overlying skin changes to suggest infection.  Discussed with Dr. Milford Cage, on-call for interventional radiology.  He reports that patient can be evaluated by IR this morning.  Patient to be kept n.p.o., here in the emergency department until evaluation.        Final Clinical Impression(s) / ED Diagnoses Final diagnoses:  Neck mass    Rx / DC Orders ED Discharge Orders     None         Keene Gilkey, Canary Brim, MD 09/21/23 [phone]

## 2023-09-23 MED FILL — Fosaprepitant Dimeglumine For IV Infusion 150 MG (Base Eq): INTRAVENOUS | Qty: 5 | Status: AC

## 2023-09-25 ENCOUNTER — Other Ambulatory Visit: Payer: Self-pay

## 2023-09-26 ENCOUNTER — Encounter: Payer: Self-pay | Admitting: *Deleted

## 2023-09-26 ENCOUNTER — Inpatient Hospital Stay (HOSPITAL_BASED_OUTPATIENT_CLINIC_OR_DEPARTMENT_OTHER): Payer: Medicare HMO | Admitting: Hematology and Oncology

## 2023-09-26 ENCOUNTER — Inpatient Hospital Stay: Payer: Medicare HMO

## 2023-09-26 VITALS — BP 146/63 | HR 109 | Temp 97.9°F | Resp 19 | Ht 69.0 in | Wt >= 6400 oz

## 2023-09-26 VITALS — BP 148/90 | HR 84 | Temp 98.0°F | Resp 20

## 2023-09-26 DIAGNOSIS — C50912 Malignant neoplasm of unspecified site of left female breast: Secondary | ICD-10-CM | POA: Diagnosis not present

## 2023-09-26 DIAGNOSIS — C773 Secondary and unspecified malignant neoplasm of axilla and upper limb lymph nodes: Secondary | ICD-10-CM | POA: Diagnosis not present

## 2023-09-26 DIAGNOSIS — D500 Iron deficiency anemia secondary to blood loss (chronic): Secondary | ICD-10-CM

## 2023-09-26 DIAGNOSIS — Z5111 Encounter for antineoplastic chemotherapy: Secondary | ICD-10-CM | POA: Diagnosis not present

## 2023-09-26 DIAGNOSIS — D509 Iron deficiency anemia, unspecified: Secondary | ICD-10-CM

## 2023-09-26 LAB — CBC WITH DIFFERENTIAL (CANCER CENTER ONLY)
Abs Immature Granulocytes: 0.41 10*3/uL — ABNORMAL HIGH (ref 0.00–0.07)
Basophils Absolute: 0 10*3/uL (ref 0.0–0.1)
Basophils Relative: 1 %
Eosinophils Absolute: 0 10*3/uL (ref 0.0–0.5)
Eosinophils Relative: 0 %
HCT: 31.6 % — ABNORMAL LOW (ref 36.0–46.0)
Hemoglobin: 9.7 g/dL — ABNORMAL LOW (ref 12.0–15.0)
Immature Granulocytes: 6 %
Lymphocytes Relative: 15 %
Lymphs Abs: 1.1 10*3/uL (ref 0.7–4.0)
MCH: 22.6 pg — ABNORMAL LOW (ref 26.0–34.0)
MCHC: 30.7 g/dL (ref 30.0–36.0)
MCV: 73.5 fL — ABNORMAL LOW (ref 80.0–100.0)
Monocytes Absolute: 0.7 10*3/uL (ref 0.1–1.0)
Monocytes Relative: 9 %
Neutro Abs: 5.3 10*3/uL (ref 1.7–7.7)
Neutrophils Relative %: 69 %
Platelet Count: 314 10*3/uL (ref 150–400)
RBC: 4.3 MIL/uL (ref 3.87–5.11)
RDW: 24 % — ABNORMAL HIGH (ref 11.5–15.5)
WBC Count: 7.5 10*3/uL (ref 4.0–10.5)
nRBC: 1.3 % — ABNORMAL HIGH (ref 0.0–0.2)

## 2023-09-26 LAB — PREGNANCY, URINE: Preg Test, Ur: NEGATIVE

## 2023-09-26 LAB — CMP (CANCER CENTER ONLY)
ALT: 12 U/L (ref 0–44)
AST: 14 U/L — ABNORMAL LOW (ref 15–41)
Albumin: 3.4 g/dL — ABNORMAL LOW (ref 3.5–5.0)
Alkaline Phosphatase: 102 U/L (ref 38–126)
Anion gap: 8 (ref 5–15)
BUN: 10 mg/dL (ref 6–20)
CO2: 26 mmol/L (ref 22–32)
Calcium: 8.8 mg/dL — ABNORMAL LOW (ref 8.9–10.3)
Chloride: 105 mmol/L (ref 98–111)
Creatinine: 0.84 mg/dL (ref 0.44–1.00)
GFR, Estimated: >60 mL/min (ref >60)
Glucose, Bld: 209 mg/dL — ABNORMAL HIGH (ref 70–99)
Potassium: 3.4 mmol/L — ABNORMAL LOW (ref 3.5–5.1)
Sodium: 139 mmol/L (ref 135–145)
Total Bilirubin: 0.3 mg/dL (ref <1.2)
Total Protein: 6.2 g/dL — ABNORMAL LOW (ref 6.5–8.1)

## 2023-09-26 MED ORDER — ACETAMINOPHEN 325 MG PO TABS
325.0000 mg | ORAL_TABLET | Freq: Once | ORAL | Status: AC
  Administered 2023-09-26: 325 mg via ORAL
  Filled 2023-09-26: qty 1

## 2023-09-26 MED ORDER — SODIUM CHLORIDE 0.9% FLUSH
10.0000 mL | INTRAVENOUS | Status: DC | PRN
  Administered 2023-09-26: 10 mL

## 2023-09-26 MED ORDER — SODIUM CHLORIDE 0.9 % IV SOLN: Freq: Once | INTRAVENOUS | Status: AC

## 2023-09-26 MED ORDER — SODIUM CHLORIDE 0.9 % IV SOLN
150.0000 mg | Freq: Once | INTRAVENOUS | Status: AC
  Administered 2023-09-26: 150 mg via INTRAVENOUS
  Filled 2023-09-26: qty 150

## 2023-09-26 MED ORDER — HEPARIN SOD (PORK) LOCK FLUSH 100 UNIT/ML IV SOLN
500.0000 [IU] | Freq: Once | INTRAVENOUS | Status: AC | PRN
  Administered 2023-09-26: 500 [IU]

## 2023-09-26 MED ORDER — OXYCODONE HCL 5 MG PO TABS
5.0000 mg | ORAL_TABLET | Freq: Once | ORAL | Status: AC
  Administered 2023-09-26: 5 mg via ORAL
  Filled 2023-09-26: qty 1

## 2023-09-26 MED ORDER — DOXORUBICIN HCL CHEMO IV INJECTION 2 MG/ML
50.0000 mg/m2 | Freq: Once | INTRAVENOUS | Status: AC
  Administered 2023-09-26: 156 mg via INTRAVENOUS
  Filled 2023-09-26: qty 78

## 2023-09-26 MED ORDER — SODIUM CHLORIDE 0.9% FLUSH
10.0000 mL | Freq: Once | INTRAVENOUS | Status: AC
  Administered 2023-09-26: 10 mL

## 2023-09-26 MED ORDER — SODIUM CHLORIDE 0.9 % IV SOLN
500.0000 mg/m2 | Freq: Once | INTRAVENOUS | Status: AC
  Administered 2023-09-26: 1500 mg via INTRAVENOUS
  Filled 2023-09-26: qty 75

## 2023-09-26 MED ORDER — PALONOSETRON HCL INJECTION 0.25 MG/5ML
0.2500 mg | Freq: Once | INTRAVENOUS | Status: AC
  Administered 2023-09-26: 0.25 mg via INTRAVENOUS
  Filled 2023-09-26: qty 5

## 2023-09-26 MED ORDER — DEXAMETHASONE SODIUM PHOSPHATE 10 MG/ML IJ SOLN
10.0000 mg | Freq: Once | INTRAMUSCULAR | Status: AC
  Administered 2023-09-26: 10 mg via INTRAVENOUS
  Filled 2023-09-26: qty 1

## 2023-09-26 MED ORDER — APIXABAN 5 MG PO TABS: 5.0000 mg | ORAL_TABLET | Freq: Two times a day (BID) | ORAL | 3 refills | Status: DC

## 2023-09-26 NOTE — Assessment & Plan Note (Signed)
07/12/2023: Premenopausal patient palpated a lump in the left axilla. Mammogram did not reveal any primary breast tumor. 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 07/27/2023: Bone scan: No evidence of skeletal metastasis   Treatment plan: Neoadjuvant chemotherapy with dose dense Adriamycin and Cytoxan x 4 followed by Taxol weekly x 12 Breast conserving surgery with targeted node dissection at [facility] Adjuvant radiation therapy Adjuvant antiestrogen therapy with CDK inhibitors ------------------------------------------------------------------------------------------------------------------- Current treatment: Cycle 3 dose dense Adriamycin and Cytoxan Chemo toxicities: Swelling and discomfort of the eyes Diarrhea Shortness of breath to exertion Nausea and vomiting starting from day 7 Mouth sores Chemo-induced anemia: Monitoring closely Thrombosis involving the port: Radiology reviewed it and felt that it is safe to use the port (I received a phone call from the emergency room to this effect).  She started anticoagulation.  In 2 weeks patient will start weekly Taxol treatments

## 2023-09-26 NOTE — Progress Notes (Signed)
Patient Care Team: [name], MD as PCP - General (Internal Medicine) Serena Croissant, MD as Medical Oncologist (Hematology and Oncology) Pershing Proud, RN as Oncology Nurse Navigator Donnelly Angelica, RN as Oncology Nurse Navigator  DIAGNOSIS:  Encounter Diagnosis  Name Primary?   Breast cancer metastasized to axillary lymph node, left (HCC) Yes    SUMMARY OF ONCOLOGIC HISTORY: Oncology History  Breast cancer metastasized to axillary lymph node, left (HCC)  07/12/2023 Initial Diagnosis   Patient palpated a lump in the left axilla.  Mammogram did not reveal any primary breast tumor.  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+   08/15/2023 -  Chemotherapy   Patient is on Treatment Plan : BREAST ADJUVANT DOSE DENSE AC q14d / PACLitaxel q7d       CHIEF COMPLIANT: Cycle 4 dose dense Adriamycin and Cytoxan  HISTORY OF PRESENT ILLNESS:  History of Present Illness   The patient, with a history of cancer, presents for a follow-up after a recent hospitalization due to a clot in her chemotherapy port. The clot caused a blockage, which has since been resolved. The patient was started on Xarelto for the clot, but reports severe headaches as a side effect. She has previously taken Eliquis and wishes to switch back to this medication. The patient also mentions a recent bereavement, which has caused some emotional distress, but she is determined to continue her fight against cancer. The patient also reports some darkening of the skin on her fingers, which is a known side effect of her chemotherapy treatment.         ALLERGIES:  is allergic to doxycycline, fish allergy, fish oil, other, pollen extract, citalopram, amoxicillin, penicillins, and strawberry extract.  MEDICATIONS:  Current Outpatient Medications  Medication Sig Dispense Refill   apixaban (ELIQUIS) 5 MG TABS tablet Take 1 tablet (5 mg total) by mouth 2 (two) times daily. 60 tablet 3    albuterol (PROAIR HFA) 108 (90 Base) MCG/ACT inhaler Use 2 puffs every 4 hours as needed for cough or wheeze.  May use 2 puffs 10-20 minutes prior to exercise. 1 Inhaler 0   benzonatate (TESSALON) 100 MG capsule Take 1 capsule (100 mg total) by mouth 3 (three) times daily as needed for cough. 20 capsule 0   cephALEXin (KEFLEX) 500 MG capsule Take 1 capsule (500 mg total) by mouth 4 (four) times daily. 28 capsule 0   cetirizine (ZYRTEC) 10 MG tablet Take 10 mg by mouth daily.     cyclobenzaprine (FLEXERIL) 10 MG tablet Take 10 mg by mouth 3 (three) times daily.     dexamethasone (DECADRON) 4 MG tablet Take 1 tablet day after chemo and 1 tablet 2 days after chemo with Adriamycin and Cytoxan 8 tablet 0   diphenoxylate-atropine (LOMOTIL) 2.5-0.025 MG tablet Take 1 tablet by mouth 4 (four) times daily as needed for diarrhea or loose stools. 30 tablet 3   gabapentin (NEURONTIN) 100 MG capsule Take 1 capsule (100 mg total) by mouth 3 (three) times daily.     glipiZIDE (GLUCOTROL XL) 10 MG 24 hr tablet Take 10 mg by mouth daily with breakfast.     ibuprofen (ADVIL) 200 MG tablet Take 800 mg by mouth every 6 (six) hours as needed.     lidocaine (LIDODERM) 5 % Place 1 patch onto the skin daily. Remove & Discard patch within 12 hours or as directed by MD 30 patch 0   lidocaine-prilocaine (EMLA) cream Apply  to affected area once 30 g 3   LINZESS 72 MCG capsule Take 72 mcg by mouth daily as needed. (Patient not taking: Reported on 09/21/2023)     losartan (COZAAR) 100 MG tablet Take 1 tablet (100 mg total) by mouth daily. 30 tablet 3   magic mouthwash (lidocaine, diphenhydrAMINE, alum & mag hydroxide) suspension Swish and swallow 5 mLs four times daily as needed for mouth pain. 360 mL 1   metFORMIN (GLUCOPHAGE) 1000 MG tablet Take 1,000 mg by mouth 2 (two) times daily with a meal. (Patient not taking: Reported on 09/21/2023)     metoprolol succinate (TOPROL-XL) 50 MG 24 hr tablet Take 50 mg by mouth daily.      montelukast (SINGULAIR) 10 MG tablet Take 10 mg by mouth at bedtime.     MOUNJARO 10 MG/0.5ML Pen Inject 10 mg into the skin once a week.     naproxen (NAPROSYN) 500 MG tablet Take 1 tablet (500 mg total) by mouth 2 (two) times daily. (Patient not taking: Reported on 09/21/2023) 30 tablet 0   Oxycodone HCl 20 MG TABS Take 1 tablet by mouth 4 (four) times daily as needed.     phenazopyridine (PYRIDIUM) 200 MG tablet Take 1 tablet (200 mg total) by mouth 3 (three) times daily as needed for pain. 9 tablet 0   prochlorperazine (COMPAZINE) 10 MG tablet Take 1 tablet (10 mg total) by mouth every 6 (six) hours as needed for nausea or vomiting. 30 tablet 1   sulfamethoxazole-trimethoprim (BACTRIM DS) 800-160 MG tablet Take 1 tablet by mouth 2 (two) times daily for 7 days. 14 tablet 0   SUMAtriptan (IMITREX) 100 MG tablet Take 100 mg by mouth as directed. (Patient not taking: Reported on 09/21/2023)     topiramate (TOPAMAX) 50 MG tablet Take 50 mg by mouth at bedtime. (Patient not taking: Reported on 09/21/2023)     No current facility-administered medications for this visit.   Facility-Administered Medications Ordered in Other Visits  Medication Dose Route Frequency Provider Last Rate Last Admin   acetaminophen (TYLENOL) tablet 325 mg  325 mg Oral Once Serena Croissant, MD       And   oxyCODONE (Oxy IR/ROXICODONE) immediate release tablet 5 mg  5 mg Oral Once Serena Croissant, MD        PHYSICAL EXAMINATION: ECOG PERFORMANCE STATUS: 1 - Symptomatic but completely ambulatory  Vitals:   09/26/23 1129  BP: (!) 146/63  Pulse: (!) 109  Resp: 19  Temp: 97.9 F (36.6 C)  SpO2: 100%   Filed Weights   09/26/23 1129  Weight: (!) 422 lb 14.4 oz (191.8 kg)    Physical Exam          (exam performed in the presence of a chaperone)  LABORATORY DATA:  I have reviewed the data as listed    Latest Ref Rng & Units 09/21/2023   12:22 AM 09/12/2023    1:20 PM 09/02/2023    1:41 PM  CMP  Glucose 70 -  99 mg/dL 782  956  213   BUN 6 - 20 mg/dL 6  6  13    Creatinine 0.44 - 1.00 mg/dL 0.86  5.78  4.69   Sodium 135 - 145 mmol/L 135  140  138   Potassium 3.5 - 5.1 mmol/L 3.1  3.3  3.8   Chloride 98 - 111 mmol/L 101  104  105   CO2 22 - 32 mmol/L 25  27  27    Calcium 8.9 -  10.3 mg/dL 8.3  8.7  8.7   Total Protein 6.5 - 8.1 g/dL  6.4  6.5   Total Bilirubin <1.2 mg/dL  0.3  0.3   Alkaline Phos 38 - 126 U/L  113  202   AST 15 - 41 U/L  19  12   ALT 0 - 44 U/L  18  10     Lab Results  Component Value Date   WBC 7.5 09/26/2023   HGB 9.7 (L) 09/26/2023   HCT 31.6 (L) 09/26/2023   MCV 73.5 (L) 09/26/2023   PLT 314 09/26/2023   NEUTROABS 5.3 09/26/2023    ASSESSMENT & PLAN:  Breast cancer metastasized to axillary lymph node, left (HCC) 07/12/2023: Premenopausal patient palpated a lump in the left axilla. Mammogram did not reveal any primary breast tumor. 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 07/27/2023: Bone scan: No evidence of skeletal metastasis   Treatment plan: Neoadjuvant chemotherapy with dose dense Adriamycin and Cytoxan x 4 followed by Taxol weekly x 12 Breast conserving surgery with targeted node dissection at [facility] Adjuvant radiation therapy Adjuvant antiestrogen therapy with CDK inhibitors ------------------------------------------------------------------------------------------------------------------- Current treatment: Cycle 3 dose dense Adriamycin and Cytoxan Chemo toxicities: Swelling and discomfort of the eyes Diarrhea Shortness of breath to exertion Nausea and vomiting starting from day 7 Nailbed discoloration Chemo-induced anemia: Monitoring closely Thrombosis involving the port: Radiology reviewed it and felt that it is safe to use the port (I received a phone call from the emergency room to this  effect).  She started anticoagulation.  I will switch her to Eliquis because she is getting headaches on Xarelto.  In 2 weeks patient will start weekly Taxol treatments She appears to be tolerating the lower dose of chemotherapy much better. Her mother passed away and has a funeral coming up.     No orders of the defined types were placed in this encounter.  The patient has a good understanding of the overall plan. she agrees with it. she will call with any problems that may develop before the next visit here. Total time spent: 30 mins including face to face time and time spent for planning, charting and co-ordination of care   Tamsen Meek, MD 09/26/23

## 2023-09-26 NOTE — Patient Instructions (Signed)
[facility] - A DEPT OF [facility]  Discharge Instructions: Thank you for choosing [facility] to provide your oncology and hematology care.   If you have a lab appointment with the Cancer Center, please go directly to the Cancer Center and check in at the registration area.   Wear comfortable clothing and clothing appropriate for easy access to any Portacath or PICC line.   We strive to give you quality time with your provider. You may need to reschedule your appointment if you arrive late (15 or more minutes).  Arriving late affects you and other patients whose appointments are after yours.  Also, if you miss three or more appointments without notifying the office, you may be dismissed from the clinic at the provider's discretion.      For prescription refill requests, have your pharmacy contact our office and allow 72 hours for refills to be completed.    Today you received the following chemotherapy and/or immunotherapy agents: Doxorubicin (Adriamycin) and Cyclophosphamide (Cytoxan)      To help prevent nausea and vomiting after your treatment, we encourage you to take your nausea medication as directed.  BELOW ARE SYMPTOMS THAT SHOULD BE REPORTED IMMEDIATELY: *FEVER GREATER THAN 100.4 F (38 C) OR HIGHER *CHILLS OR SWEATING *NAUSEA AND VOMITING THAT IS NOT CONTROLLED WITH YOUR NAUSEA MEDICATION *UNUSUAL SHORTNESS OF BREATH *UNUSUAL BRUISING OR BLEEDING *URINARY PROBLEMS (pain or burning when urinating, or frequent urination) *BOWEL PROBLEMS (unusual diarrhea, constipation, pain near the anus) TENDERNESS IN MOUTH AND THROAT WITH OR WITHOUT PRESENCE OF ULCERS (sore throat, sores in mouth, or a toothache) UNUSUAL RASH, SWELLING OR PAIN  UNUSUAL VAGINAL DISCHARGE OR ITCHING   Items with * indicate a potential emergency and should be followed up as soon as possible or go to the Emergency Department if any problems should occur.  Please  show the CHEMOTHERAPY ALERT CARD or IMMUNOTHERAPY ALERT CARD at check-in to the Emergency Department and triage nurse.  Should you have questions after your visit or need to cancel or reschedule your appointment, please contact [facility] - A DEPT OF [facility]  Dept: [phone]  and follow the prompts.  Office hours are 8:00 a.m. to 4:30 p.m. Monday - Friday. Please note that voicemails left after 4:00 p.m. may not be returned until the following business day.  We are closed weekends and major holidays. You have access to a nurse at all times for urgent questions. Please call the main number to the clinic Dept: [phone] and follow the prompts.   For any non-urgent questions, you may also contact your provider using MyChart. We now offer e-Visits for anyone 50 and older to request care online for non-urgent symptoms. For details visit [email or web address].   Also download the MyChart app! Go to the app store, search "MyChart", open the app, select [facility], and log in with your MyChart username and password.

## 2023-09-28 ENCOUNTER — Inpatient Hospital Stay: Payer: Medicare HMO

## 2023-09-28 VITALS — BP 148/86 | HR 96 | Temp 98.3°F | Resp 18

## 2023-09-28 DIAGNOSIS — Z5111 Encounter for antineoplastic chemotherapy: Secondary | ICD-10-CM | POA: Diagnosis not present

## 2023-09-28 DIAGNOSIS — C50912 Malignant neoplasm of unspecified site of left female breast: Secondary | ICD-10-CM

## 2023-09-28 MED ORDER — PEGFILGRASTIM-JMDB 6 MG/0.6ML ~~LOC~~ SOSY
6.0000 mg | PREFILLED_SYRINGE | Freq: Once | SUBCUTANEOUS | Status: AC
  Administered 2023-09-28: 6 mg via SUBCUTANEOUS
  Filled 2023-09-28: qty 0.6

## 2023-10-05 ENCOUNTER — Encounter: Payer: Self-pay | Admitting: Hematology and Oncology

## 2023-10-05 ENCOUNTER — Encounter: Payer: Self-pay | Admitting: *Deleted

## 2023-10-05 NOTE — Progress Notes (Signed)
Received mychart message from pt stating she is experiencing ongoing boils on her vaginal area that are painful and becoming increasingly worse.  Per MD pt needing [facility] visit for further evaluation and tx.  Appt scheduled, pt notified and verbalized understanding.

## 2023-10-06 ENCOUNTER — Inpatient Hospital Stay: Payer: Medicare HMO | Attending: Physician Assistant | Admitting: Adult Health

## 2023-10-06 ENCOUNTER — Encounter: Payer: Self-pay | Admitting: Adult Health

## 2023-10-06 ENCOUNTER — Other Ambulatory Visit: Payer: Self-pay | Admitting: *Deleted

## 2023-10-06 VITALS — BP 120/78 | HR 112 | Temp 97.3°F | Resp 18 | Wt >= 6400 oz

## 2023-10-06 DIAGNOSIS — M79606 Pain in leg, unspecified: Secondary | ICD-10-CM | POA: Insufficient documentation

## 2023-10-06 DIAGNOSIS — G4730 Sleep apnea, unspecified: Secondary | ICD-10-CM | POA: Diagnosis not present

## 2023-10-06 DIAGNOSIS — Z86711 Personal history of pulmonary embolism: Secondary | ICD-10-CM | POA: Diagnosis not present

## 2023-10-06 DIAGNOSIS — G629 Polyneuropathy, unspecified: Secondary | ICD-10-CM | POA: Diagnosis not present

## 2023-10-06 DIAGNOSIS — Z9221 Personal history of antineoplastic chemotherapy: Secondary | ICD-10-CM | POA: Diagnosis not present

## 2023-10-06 DIAGNOSIS — Z800 Family history of malignant neoplasm of digestive organs: Secondary | ICD-10-CM | POA: Insufficient documentation

## 2023-10-06 DIAGNOSIS — C773 Secondary and unspecified malignant neoplasm of axilla and upper limb lymph nodes: Secondary | ICD-10-CM | POA: Insufficient documentation

## 2023-10-06 DIAGNOSIS — Z7901 Long term (current) use of anticoagulants: Secondary | ICD-10-CM | POA: Insufficient documentation

## 2023-10-06 DIAGNOSIS — D6481 Anemia due to antineoplastic chemotherapy: Secondary | ICD-10-CM | POA: Insufficient documentation

## 2023-10-06 DIAGNOSIS — E876 Hypokalemia: Secondary | ICD-10-CM | POA: Diagnosis not present

## 2023-10-06 DIAGNOSIS — R21 Rash and other nonspecific skin eruption: Secondary | ICD-10-CM | POA: Diagnosis not present

## 2023-10-06 DIAGNOSIS — R110 Nausea: Secondary | ICD-10-CM | POA: Insufficient documentation

## 2023-10-06 DIAGNOSIS — Z5111 Encounter for antineoplastic chemotherapy: Secondary | ICD-10-CM | POA: Diagnosis present

## 2023-10-06 DIAGNOSIS — Z79899 Other long term (current) drug therapy: Secondary | ICD-10-CM | POA: Insufficient documentation

## 2023-10-06 DIAGNOSIS — N920 Excessive and frequent menstruation with regular cycle: Secondary | ICD-10-CM | POA: Insufficient documentation

## 2023-10-06 DIAGNOSIS — K6289 Other specified diseases of anus and rectum: Secondary | ICD-10-CM | POA: Diagnosis not present

## 2023-10-06 DIAGNOSIS — C50912 Malignant neoplasm of unspecified site of left female breast: Secondary | ICD-10-CM

## 2023-10-06 DIAGNOSIS — D509 Iron deficiency anemia, unspecified: Secondary | ICD-10-CM | POA: Diagnosis not present

## 2023-10-06 DIAGNOSIS — Z803 Family history of malignant neoplasm of breast: Secondary | ICD-10-CM | POA: Insufficient documentation

## 2023-10-06 DIAGNOSIS — I10 Essential (primary) hypertension: Secondary | ICD-10-CM | POA: Insufficient documentation

## 2023-10-06 DIAGNOSIS — T451X5A Adverse effect of antineoplastic and immunosuppressive drugs, initial encounter: Secondary | ICD-10-CM | POA: Diagnosis not present

## 2023-10-06 DIAGNOSIS — G4733 Obstructive sleep apnea (adult) (pediatric): Secondary | ICD-10-CM | POA: Diagnosis not present

## 2023-10-06 DIAGNOSIS — T82868A Thrombosis of vascular prosthetic devices, implants and grafts, initial encounter: Secondary | ICD-10-CM | POA: Diagnosis not present

## 2023-10-06 DIAGNOSIS — Z86718 Personal history of other venous thrombosis and embolism: Secondary | ICD-10-CM | POA: Diagnosis not present

## 2023-10-06 DIAGNOSIS — D573 Sickle-cell trait: Secondary | ICD-10-CM | POA: Diagnosis not present

## 2023-10-06 DIAGNOSIS — J45909 Unspecified asthma, uncomplicated: Secondary | ICD-10-CM | POA: Diagnosis not present

## 2023-10-06 DIAGNOSIS — R197 Diarrhea, unspecified: Secondary | ICD-10-CM | POA: Diagnosis not present

## 2023-10-06 DIAGNOSIS — E119 Type 2 diabetes mellitus without complications: Secondary | ICD-10-CM | POA: Insufficient documentation

## 2023-10-06 DIAGNOSIS — Z7984 Long term (current) use of oral hypoglycemic drugs: Secondary | ICD-10-CM | POA: Insufficient documentation

## 2023-10-06 MED ORDER — TRIAMCINOLONE ACETONIDE 0.025 % EX OINT: 1.0000 | TOPICAL_OINTMENT | Freq: Two times a day (BID) | CUTANEOUS | 0 refills | Status: DC

## 2023-10-06 MED ORDER — SULFAMETHOXAZOLE-TRIMETHOPRIM 800-160 MG PO TABS: 1.0000 | ORAL_TABLET | Freq: Two times a day (BID) | ORAL | 0 refills | Status: DC

## 2023-10-06 MED ORDER — FLUCONAZOLE 150 MG PO TABS
ORAL_TABLET | ORAL | 1 refills | Status: DC
Start: 2023-10-06 — End: 2024-02-13

## 2023-10-06 NOTE — Progress Notes (Signed)
[facility] Cancer Follow up:    [name] [address] [place] [address]   DIAGNOSIS:  Cancer Staging  No matching staging information was found for the patient.   SUMMARY OF ONCOLOGIC HISTORY: Oncology History  Breast cancer metastasized to axillary lymph node, left (HCC)  07/12/2023 Initial Diagnosis   Patient palpated a lump in the left axilla.  Mammogram did not reveal any primary breast tumor.  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+   08/15/2023 -  Chemotherapy   Patient is on Treatment Plan : BREAST ADJUVANT DOSE DENSE AC q14d / PACLitaxel q7d       CURRENT THERAPY:  INTERVAL HISTORY: [name] 47 y.o. female returns for evaluation of a chief complaint of severe irritation in the anal area, which has been ongoing for an unspecified duration. The irritation was initially triggered by the use of antibacterial wipes, which led to a 'perfect storm' of symptoms. The patient describes the irritation as severe and has been experiencing concurrent issues with diarrhea, which has exacerbated the irritation.  The patient has been prescribed an antibiotic in the past, which was reported to have caused small lesions in the affected area. The patient also reports a lesion in the anal area, which is either a boil or a viral lesion. The patient denies any pain associated with the lesion but describes it as sensitive to the touch.  The patient has a history of breast cancer and is currently undergoing chemotherapy. The patient reports that the tumor is shrinking and the treatment is working, despite feeling unwell. The patient also has a history of using white blood cell growth factors, which may have contributed to the current irritation.  The patient's mother recently passed away from stage four liver, colon, and stomach cancer, and the patient is due to attend the funeral soon. The patient expresses concern  about managing the irritation during this time.  The patient has a son with special needs who was shot five times in a case of mistaken identity. The son survived the incident but has been left with post-traumatic stress disorder and suicidal ideations. The patient expresses that this incident has added to her overall stress levels.  The patient is committed to managing her health and is actively seeking solutions to alleviate the current irritation. She has been keeping the area clean and is open to trying new treatments as recommended by the healthcare provider.   Patient Active Problem List   Diagnosis Date Noted   Breast cancer metastasized to axillary lymph node, left (HCC) 07/19/2023   Menorrhagia 03/01/2018   Fibroids 03/01/2018   Obesity, morbid (HCC) 12/24/2013   Obesity hypoventilation syndrome (HCC) 12/24/2013   Iron deficiency anemia 09/29/2012   Pulmonary embolism (HCC) 09/26/2012   SOB (shortness of breath) 09/25/2012   Anemia 09/25/2012   PALPITATIONS 03/10/2010   DEPRESSION 12/09/2009   MENORRHAGIA 12/09/2009   Disorder of bursae and tendons in shoulder region 12/09/2009   PANIC DISORDER 07/17/2009   LUMBAR RADICULOPATHY, LEFT 07/17/2009   SLEEP APNEA, OBSTRUCTIVE 11/05/2008   PERIPHERAL EDEMA 11/05/2008   URINALYSIS, ABNORMAL 11/05/2008   Morbid obesity (HCC) 06/09/2007   HYPERTENSION, BENIGN ESSENTIAL 06/09/2007   Allergic rhinitis 06/09/2007   Asthma 06/09/2007   URTICARIA 06/09/2007   ANXIETY DISORDER, HX OF 06/09/2007   DEPRESSION, HX OF 06/09/2007   HYPERGLYCEMIA 06/02/2007   CYST, VULVA 07/25/2006   ANEMIA, IRON DEFICIENCY 12/28/2005  ECHOCARDIOGRAM, ABNORMAL 01/25/2005   Personal history of venous thrombosis and embolism 01/24/2005    is allergic to doxycycline, fish allergy, fish oil, other, pollen extract, citalopram, amoxicillin, penicillins, and strawberry extract.  MEDICAL HISTORY: Past Medical History:  Diagnosis Date   Allergic rhinitis     Anemia    Anxiety disorder    Asthma    Benign essential hypertension    Depression    Diabetes mellitus without complication (HCC)    Edema    History of blood clots    Hyperglycemia    Lumbar radiculopathy    Metrorrhagia    Morbid obesity (HCC)    OSA (obstructive sleep apnea)    Sickle cell trait (HCC)    Snoring     SURGICAL HISTORY: Past Surgical History:  Procedure Laterality Date   CESAREAN SECTION     CHOLECYSTECTOMY     IR IMAGING GUIDED PORT INSERTION  08/08/2023   IR US SOFT TISSUE NECK  09/21/2023   TUBAL LIGATION      SOCIAL HISTORY: Social History   Socioeconomic History   Marital status: Single    Spouse name: Not on file   Number of children: 3   Years of education: 16   Highest education level: Not on file  Occupational History   Not on file  Tobacco Use   Smoking status: Never   Smokeless tobacco: Never  Vaping Use   Vaping status: Never Used  Substance and Sexual Activity   Alcohol use: Not Currently   Drug use: No   Sexual activity: Yes    Partners: Male    Birth control/protection: Surgical  Other Topics Concern   Not on file  Social History Narrative   Patient is single and lives with her children.   Patient has three children.   Patient is currently unemployed.   Patient is a Holiday representative in college now.   Patient is right-handed.   Patient drinks one cup of tea every other day.   Social Determinants of Health   Financial Resource Strain: Not on file  Food Insecurity: Not on file  Transportation Needs: Not on file  Physical Activity: Not on file  Stress: Not on file  Social Connections: Unknown (06/07/2023)   Received from [facility]   Social Network    Social Network: Not on file  Intimate Partner Violence: Unknown (06/07/2023)   Received from [facility]   HITS    Physically Hurt: Not on file    Insult or Talk Down To: Not on file    Threaten Physical Harm: Not on file    Scream or Curse: Not on file    FAMILY  HISTORY: Family History  Problem Relation Age of Onset   Asthma Mother    Hypertension Mother    Leukemia Father    Diabetes Father    Breast cancer Maternal Aunt    Breast cancer Paternal Aunt    Breast cancer Cousin    Diabetes Brother     Review of Systems  Constitutional:  Negative for appetite change, chills, fatigue, fever and unexpected weight change.  HENT:   Negative for hearing loss, lump/mass and trouble swallowing.   Eyes:  Negative for eye problems and icterus.  Respiratory:  Negative for chest tightness, cough and shortness of breath.   Cardiovascular:  Negative for chest pain, leg swelling and palpitations.  Gastrointestinal:  Negative for abdominal distention, abdominal pain, constipation, diarrhea, nausea and vomiting.  Endocrine: Negative for hot flashes.  Genitourinary:  Negative for difficulty urinating.   Musculoskeletal:  Negative for arthralgias.  Skin:  Negative for itching and rash.  Neurological:  Negative for dizziness, extremity weakness, headaches and numbness.  Hematological:  Negative for adenopathy. Does not bruise/bleed easily.  Psychiatric/Behavioral:  Negative for depression. The patient is not nervous/anxious.       PHYSICAL EXAMINATION   Onc Performance Status - 10/06/23 1400       KPS SCALE   KPS % SCORE Able to carry on normal activity, minor s/s of disease             Vitals:   10/06/23 1356  BP: 120/78  Pulse: (!) 112  Resp: 18  Temp: (!) 97.3 F (36.3 C)  SpO2: 97%    Physical Exam Constitutional:      General: She is not in acute distress.    Appearance: Normal appearance. She is not toxic-appearing.  HENT:     Head: Normocephalic and atraumatic.  Genitourinary:      Comments: Pubic area, perineal area and inguinal canal with excoriation and slight appearance of inflammation.  Noted above with red circle was the location of a cyst--about 1cm in size, no surrounding erythema, in the gluteal fold noted with the  blue circle was an ulcerative lesion about 5mm in size that was cultured.   Neurological:     Mental Status: She is alert.  Psychiatric:        Mood and Affect: Mood normal.        Behavior: Behavior normal.      ASSESSMENT and THERAPY PLAN:   Breast cancer metastasized to axillary lymph node, left (HCC) Skin Irritation Severe irritation in the groin area with excoriation and a possible viral lesion. No signs of acute infection. -Send in triamcinolone ointment to apply twice a day in the areas that are irritated and inflamed. -Obtained a viral swab to rule out viral infection, results pending. -Advise use of Desitin if needed. -If irritation persists or worsens, consider increasing the potency of the steroid--patient to call our office if she feels this is necessary  Possible Infection History of recent antibiotic use (keflex) for a previous infection. No signs of acute infection at this time. -Send in a prescription for Septra DS as she is in her nadir from having received chemotherapy, but advise patient to hold off on taking it and try the ointment first. -If signs of infection develop, begin taking the antibiotic. (She is allergic to amoxicillin and doxycycline, recently completed keflex)  Breast Cancer Patient is currently undergoing chemotherapy for breast cancer. Reports that the tumor is shrinking and the treatment is working. -Continue current treatment regimen. -Follow up as needed for any changes or concerns.  RTC as scheduled for labs, f/u, and chemotherapy   All questions were answered. The patient knows to call the clinic with any problems, questions or concerns. We can certainly see the patient much sooner if necessary.  Total encounter time:30 minutes*in face-to-face visit time, chart review, lab review, care coordination, order entry, and documentation of the encounter time.    Lillard Anes, NP 10/06/23 5:01 PM Medical Oncology and Hematology [facility] [address] [place], [place] [address] Tel. [phone]    Fax. [phone]  *Total Encounter Time as defined by the Centers for Medicare and Medicaid Services includes, in addition to the face-to-face time of a patient visit (documented in the note above) non-face-to-face time: obtaining and reviewing outside history, ordering and reviewing medications, tests or procedures,  care coordination (communications with other health care professionals or caregivers) and documentation in the medical record.

## 2023-10-06 NOTE — Assessment & Plan Note (Signed)
Skin Irritation Severe irritation in the groin area with excoriation and a possible viral lesion. No signs of acute infection. -Send in triamcinolone ointment to apply twice a day in the areas that are irritated and inflamed. -Obtained a viral swab to rule out viral infection, results pending. -Advise use of Desitin if needed. -If irritation persists or worsens, consider increasing the potency of the steroid--patient to call our office if she feels this is necessary  Possible Infection History of recent antibiotic use (keflex) for a previous infection. No signs of acute infection at this time. -Send in a prescription for Septra DS as she is in her nadir from having received chemotherapy, but advise patient to hold off on taking it and try the ointment first. -If signs of infection develop, begin taking the antibiotic. (She is allergic to amoxicillin and doxycycline, recently completed keflex)  Breast Cancer Patient is currently undergoing chemotherapy for breast cancer. Reports that the tumor is shrinking and the treatment is working. -Continue current treatment regimen. -Follow up as needed for any changes or concerns.  RTC as scheduled for labs, f/u, and chemotherapy

## 2023-10-10 ENCOUNTER — Inpatient Hospital Stay: Payer: Medicare HMO

## 2023-10-10 ENCOUNTER — Inpatient Hospital Stay: Payer: Medicare HMO | Admitting: Adult Health

## 2023-10-10 ENCOUNTER — Telehealth: Payer: Self-pay

## 2023-10-10 ENCOUNTER — Other Ambulatory Visit: Payer: Self-pay

## 2023-10-10 NOTE — Telephone Encounter (Addendum)
Called pt. Regarding message below. Pt states that the cream is great and it is doing a lot better. Pt verbalized understanding.----- Message from Noreene Filbert sent at 10/10/2023 11:22 AM EST ----- Please let patient know the preliminary initial report of the viral culture we did in clinic last week is negative.  The culture will be put back into processing and will receive more results in 4 days time. ----- Message ----- From: Leory Plowman, Lab In  Sent: 10/08/2023   8:37 AM EST To: Loa Socks, NP

## 2023-10-13 LAB — VIRUS CULTURE

## 2023-10-15 ENCOUNTER — Other Ambulatory Visit: Payer: Self-pay

## 2023-10-17 ENCOUNTER — Encounter: Payer: Self-pay | Admitting: Hematology and Oncology

## 2023-10-17 ENCOUNTER — Other Ambulatory Visit: Payer: Self-pay | Admitting: Hematology and Oncology

## 2023-10-17 DIAGNOSIS — C50912 Malignant neoplasm of unspecified site of left female breast: Secondary | ICD-10-CM

## 2023-10-18 ENCOUNTER — Inpatient Hospital Stay: Payer: Medicare HMO

## 2023-10-18 ENCOUNTER — Inpatient Hospital Stay (HOSPITAL_BASED_OUTPATIENT_CLINIC_OR_DEPARTMENT_OTHER): Payer: Medicare HMO | Admitting: Hematology and Oncology

## 2023-10-18 VITALS — BP 130/58 | HR 112 | Temp 98.1°F | Resp 18 | Wt >= 6400 oz

## 2023-10-18 DIAGNOSIS — D509 Iron deficiency anemia, unspecified: Secondary | ICD-10-CM

## 2023-10-18 DIAGNOSIS — R0602 Shortness of breath: Secondary | ICD-10-CM

## 2023-10-18 DIAGNOSIS — C773 Secondary and unspecified malignant neoplasm of axilla and upper limb lymph nodes: Secondary | ICD-10-CM

## 2023-10-18 DIAGNOSIS — Z5111 Encounter for antineoplastic chemotherapy: Secondary | ICD-10-CM | POA: Diagnosis not present

## 2023-10-18 DIAGNOSIS — D500 Iron deficiency anemia secondary to blood loss (chronic): Secondary | ICD-10-CM

## 2023-10-18 DIAGNOSIS — C50912 Malignant neoplasm of unspecified site of left female breast: Secondary | ICD-10-CM

## 2023-10-18 DIAGNOSIS — R051 Acute cough: Secondary | ICD-10-CM

## 2023-10-18 LAB — IRON AND IRON BINDING CAPACITY (CC-WL,HP ONLY)
Iron: 22 ug/dL — ABNORMAL LOW (ref 28–170)
Saturation Ratios: 7 % — ABNORMAL LOW (ref 10.4–31.8)
TIBC: 307 ug/dL (ref 250–450)
UIBC: 285 ug/dL (ref 148–442)

## 2023-10-18 LAB — CBC WITH DIFFERENTIAL (CANCER CENTER ONLY)
Abs Immature Granulocytes: 0.1 10*3/uL — ABNORMAL HIGH (ref 0.00–0.07)
Basophils Absolute: 0 10*3/uL (ref 0.0–0.1)
Basophils Relative: 0 %
Eosinophils Absolute: 0 10*3/uL (ref 0.0–0.5)
Eosinophils Relative: 0 %
HCT: 30.3 % — ABNORMAL LOW (ref 36.0–46.0)
Hemoglobin: 9.5 g/dL — ABNORMAL LOW (ref 12.0–15.0)
Immature Granulocytes: 2 %
Lymphocytes Relative: 12 %
Lymphs Abs: 0.8 10*3/uL (ref 0.7–4.0)
MCH: 23.3 pg — ABNORMAL LOW (ref 26.0–34.0)
MCHC: 31.4 g/dL (ref 30.0–36.0)
MCV: 74.3 fL — ABNORMAL LOW (ref 80.0–100.0)
Monocytes Absolute: 0.6 10*3/uL (ref 0.1–1.0)
Monocytes Relative: 9 %
Neutro Abs: 5.2 10*3/uL (ref 1.7–7.7)
Neutrophils Relative %: 77 %
Platelet Count: 399 10*3/uL (ref 150–400)
RBC: 4.08 MIL/uL (ref 3.87–5.11)
RDW: 23.8 % — ABNORMAL HIGH (ref 11.5–15.5)
WBC Count: 6.6 10*3/uL (ref 4.0–10.5)
nRBC: 0 % (ref 0.0–0.2)

## 2023-10-18 LAB — CMP (CANCER CENTER ONLY)
ALT: 12 U/L (ref 0–44)
AST: 18 U/L (ref 15–41)
Albumin: 3.3 g/dL — ABNORMAL LOW (ref 3.5–5.0)
Alkaline Phosphatase: 88 U/L (ref 38–126)
Anion gap: 6 (ref 5–15)
BUN: 7 mg/dL (ref 6–20)
CO2: 27 mmol/L (ref 22–32)
Calcium: 8.9 mg/dL (ref 8.9–10.3)
Chloride: 105 mmol/L (ref 98–111)
Creatinine: 0.79 mg/dL (ref 0.44–1.00)
GFR, Estimated: >60 mL/min (ref >60)
Glucose, Bld: 171 mg/dL — ABNORMAL HIGH (ref 70–99)
Potassium: 3.7 mmol/L (ref 3.5–5.1)
Sodium: 138 mmol/L (ref 135–145)
Total Bilirubin: 0.3 mg/dL (ref <1.2)
Total Protein: 6.2 g/dL — ABNORMAL LOW (ref 6.5–8.1)

## 2023-10-18 LAB — FERRITIN: Ferritin: 33 ng/mL (ref 11–307)

## 2023-10-18 MED ORDER — SODIUM CHLORIDE 0.9% FLUSH
10.0000 mL | Freq: Once | INTRAVENOUS | Status: AC
  Administered 2023-10-18: 10 mL

## 2023-10-18 MED ORDER — HEPARIN SOD (PORK) LOCK FLUSH 100 UNIT/ML IV SOLN
500.0000 [IU] | Freq: Once | INTRAVENOUS | Status: AC | PRN
  Administered 2023-10-18: 500 [IU]

## 2023-10-18 NOTE — Progress Notes (Signed)
Patient Care Team: [name], MD as PCP - General (Internal Medicine) Serena Croissant, MD as Medical Oncologist (Hematology and Oncology) Pershing Proud, RN as Oncology Nurse Navigator Donnelly Angelica, RN as Oncology Nurse Navigator  DIAGNOSIS:  Encounter Diagnosis  Name Primary?   Breast cancer metastasized to axillary lymph node, left (HCC) Yes    SUMMARY OF ONCOLOGIC HISTORY: Oncology History  Breast cancer metastasized to axillary lymph node, left (HCC)  07/12/2023 Initial Diagnosis   Patient palpated a lump in the left axilla.  Mammogram did not reveal any primary breast tumor.  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+   08/15/2023 -  Chemotherapy   Patient is on Treatment Plan : BREAST ADJUVANT DOSE DENSE AC q14d / PACLitaxel q7d       CHIEF COMPLIANT: cycle 1 Taxol tomorrow  HISTORY OF PRESENT ILLNESS: Patient was late in coming to our clinic for her chemo. She reports nausea and fastigue     ALLERGIES:  is allergic to doxycycline, fish allergy, fish oil, other, pollen extract, citalopram, amoxicillin, penicillins, and strawberry extract.  MEDICATIONS:  Current Outpatient Medications  Medication Sig Dispense Refill   albuterol (PROAIR HFA) 108 (90 Base) MCG/ACT inhaler Use 2 puffs every 4 hours as needed for cough or wheeze.  May use 2 puffs 10-20 minutes prior to exercise. 1 Inhaler 0   apixaban (ELIQUIS) 5 MG TABS tablet Take 1 tablet (5 mg total) by mouth 2 (two) times daily. 60 tablet 3   cetirizine (ZYRTEC) 10 MG tablet Take 10 mg by mouth daily.     cyclobenzaprine (FLEXERIL) 10 MG tablet Take 10 mg by mouth 3 (three) times daily.     dexamethasone (DECADRON) 4 MG tablet Take 1 tablet day after chemo and 1 tablet 2 days after chemo with Adriamycin and Cytoxan 8 tablet 0   diphenoxylate-atropine (LOMOTIL) 2.5-0.025 MG tablet Take 1 tablet by mouth 4 (four) times daily as needed for diarrhea or loose stools. 30  tablet 3   fluconazole (DIFLUCAN) 150 MG tablet Take 1 tab x 1 and repeat again after 5 days 2 tablet 1   gabapentin (NEURONTIN) 100 MG capsule Take 1 capsule (100 mg total) by mouth 3 (three) times daily.     glipiZIDE (GLUCOTROL XL) 10 MG 24 hr tablet Take 10 mg by mouth daily with breakfast.     ibuprofen (ADVIL) 200 MG tablet Take 800 mg by mouth every 6 (six) hours as needed. (Patient not taking: Reported on 10/06/2023)     lidocaine (LIDODERM) 5 % Place 1 patch onto the skin daily. Remove & Discard patch within 12 hours or as directed by MD 30 patch 0   lidocaine-prilocaine (EMLA) cream Apply to affected area once 30 g 3   LINZESS 72 MCG capsule Take 72 mcg by mouth daily as needed. (Patient not taking: Reported on 10/06/2023)     losartan (COZAAR) 100 MG tablet Take 1 tablet (100 mg total) by mouth daily. 30 tablet 3   magic mouthwash (lidocaine, diphenhydrAMINE, alum & mag hydroxide) suspension Swish and swallow 5 mLs four times daily as needed for mouth pain. (Patient not taking: Reported on 10/06/2023) 360 mL 1   metoprolol succinate (TOPROL-XL) 50 MG 24 hr tablet Take 50 mg by mouth daily.     montelukast (SINGULAIR) 10 MG tablet Take 10 mg by mouth at bedtime.     MOUNJARO 10 MG/0.5ML Pen Inject 10 mg into the skin  once a week.     Oxycodone HCl 20 MG TABS Take 1 tablet by mouth 4 (four) times daily as needed.     prochlorperazine (COMPAZINE) 10 MG tablet Take 1 tablet (10 mg total) by mouth every 6 (six) hours as needed for nausea or vomiting. 30 tablet 1   sulfamethoxazole-trimethoprim (BACTRIM DS) 800-160 MG tablet Take 1 tablet by mouth 2 (two) times daily. 14 tablet 0   SUMAtriptan (IMITREX) 100 MG tablet Take 100 mg by mouth as directed. (Patient not taking: Reported on 09/21/2023)     topiramate (TOPAMAX) 50 MG tablet Take 50 mg by mouth at bedtime. (Patient not taking: Reported on 09/21/2023)     triamcinolone (KENALOG) 0.025 % ointment Apply 1 Application topically 2 (two) times  daily. 80 each 0   No current facility-administered medications for this visit.    PHYSICAL EXAMINATION: ECOG PERFORMANCE STATUS: 1 - Symptomatic but completely ambulatory  Vitals:   10/18/23 1234  BP: (!) 130/58  Pulse: (!) 112  Resp: 18  Temp: 98.1 F (36.7 C)  SpO2: 99%   Filed Weights   10/18/23 1234  Weight: (!) 420 lb 12.8 oz (190.9 kg)    Physical Exam          (exam performed in the presence of a chaperone)  LABORATORY DATA:  I have reviewed the data as listed    Latest Ref Rng & Units 10/18/2023   12:03 PM 09/26/2023   11:08 AM 09/21/2023   12:22 AM  CMP  Glucose 70 - 99 mg/dL 657  846  962   BUN 6 - 20 mg/dL 7  10  6    Creatinine 0.44 - 1.00 mg/dL 9.52  8.41  3.24   Sodium 135 - 145 mmol/L 138  139  135   Potassium 3.5 - 5.1 mmol/L 3.7  3.4  3.1   Chloride 98 - 111 mmol/L 105  105  101   CO2 22 - 32 mmol/L 27  26  25    Calcium 8.9 - 10.3 mg/dL 8.9  8.8  8.3   Total Protein 6.5 - 8.1 g/dL 6.2  6.2    Total Bilirubin <1.2 mg/dL 0.3  0.3    Alkaline Phos 38 - 126 U/L 88  102    AST 15 - 41 U/L 18  14    ALT 0 - 44 U/L 12  12      Lab Results  Component Value Date   WBC 6.6 10/18/2023   HGB 9.5 (L) 10/18/2023   HCT 30.3 (L) 10/18/2023   MCV 74.3 (L) 10/18/2023   PLT 399 10/18/2023   NEUTROABS 5.2 10/18/2023    ASSESSMENT & PLAN:  Breast cancer metastasized to axillary lymph node, left (HCC) 07/12/2023: Premenopausal patient palpated a lump in the left axilla. Mammogram did not reveal any primary breast tumor. 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 07/27/2023: Bone scan: No evidence of skeletal metastasis   Treatment plan: Neoadjuvant chemotherapy with dose dense Adriamycin and Cytoxan x 4 followed by Taxol weekly x 12 Breast conserving surgery with targeted node dissection at  [facility] Adjuvant radiation therapy Adjuvant antiestrogen therapy with CDK inhibitors ------------------------------------------------------------------------------------------------------------------- Current treatment: Completed 4 cycles of dose dense Adriamycin and Cytoxan, tomorrow will be cycle 1 Taxol Chemo toxicities: Nailbed discoloration Chemo-induced anemia: Monitoring closely Thrombosis involving the port Alopecia  Return to clinic weekly for chemo and every other week for follow-up with me        No orders of the defined types were placed in this encounter.  The patient has a good understanding of the overall plan. she agrees with it. she will call with any problems that may develop before the next visit here. Total time spent: 30 mins including face to face time and time spent for planning, charting and co-ordination of care   Tamsen Meek, MD 10/18/23

## 2023-10-18 NOTE — Assessment & Plan Note (Signed)
07/12/2023: Premenopausal patient palpated a lump in the left axilla. Mammogram did not reveal any primary breast tumor. 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 07/27/2023: Bone scan: No evidence of skeletal metastasis   Treatment plan: Neoadjuvant chemotherapy with dose dense Adriamycin and Cytoxan x 4 followed by Taxol weekly x 12 Breast conserving surgery with targeted node dissection at [facility] Adjuvant radiation therapy Adjuvant antiestrogen therapy with CDK inhibitors ------------------------------------------------------------------------------------------------------------------- Current treatment: Completed 4 cycles of dose dense Adriamycin and Cytoxan, today cycle 1 Taxol Chemo toxicities: Nailbed discoloration Chemo-induced anemia: Monitoring closely Thrombosis involving the port Alopecia  Return to clinic weekly for chemo and every other week for follow-up with me

## 2023-10-19 ENCOUNTER — Other Ambulatory Visit: Payer: Self-pay | Admitting: *Deleted

## 2023-10-19 ENCOUNTER — Encounter: Payer: Self-pay | Admitting: *Deleted

## 2023-10-19 ENCOUNTER — Other Ambulatory Visit: Payer: Self-pay | Admitting: Physician Assistant

## 2023-10-19 ENCOUNTER — Inpatient Hospital Stay: Payer: Medicare HMO

## 2023-10-19 VITALS — BP 132/84 | HR 97 | Temp 99.0°F | Resp 18

## 2023-10-19 DIAGNOSIS — C50912 Malignant neoplasm of unspecified site of left female breast: Secondary | ICD-10-CM

## 2023-10-19 DIAGNOSIS — Z5111 Encounter for antineoplastic chemotherapy: Secondary | ICD-10-CM | POA: Diagnosis not present

## 2023-10-19 DIAGNOSIS — L97521 Non-pressure chronic ulcer of other part of left foot limited to breakdown of skin: Secondary | ICD-10-CM

## 2023-10-19 MED ORDER — PALONOSETRON HCL INJECTION 0.25 MG/5ML
0.2500 mg | Freq: Once | INTRAVENOUS | Status: AC
  Administered 2023-10-19: 0.25 mg via INTRAVENOUS
  Filled 2023-10-19: qty 5

## 2023-10-19 MED ORDER — DEXAMETHASONE SODIUM PHOSPHATE 10 MG/ML IJ SOLN
10.0000 mg | Freq: Once | INTRAMUSCULAR | Status: AC
Start: 2023-10-19 — End: 2023-10-19
  Administered 2023-10-19: 10 mg via INTRAVENOUS
  Filled 2023-10-19: qty 1

## 2023-10-19 MED ORDER — DIPHENHYDRAMINE HCL 50 MG/ML IJ SOLN
25.0000 mg | Freq: Once | INTRAMUSCULAR | Status: AC
  Administered 2023-10-19: 25 mg via INTRAVENOUS
  Filled 2023-10-19: qty 1

## 2023-10-19 MED ORDER — SODIUM CHLORIDE 0.9 % IV SOLN
80.0000 mg/m2 | Freq: Once | INTRAVENOUS | Status: AC
  Administered 2023-10-19: 246 mg via INTRAVENOUS
  Filled 2023-10-19: qty 41

## 2023-10-19 MED ORDER — SODIUM CHLORIDE 0.9% FLUSH
10.0000 mL | INTRAVENOUS | Status: DC | PRN
  Administered 2023-10-19: 10 mL

## 2023-10-19 MED ORDER — HEPARIN SOD (PORK) LOCK FLUSH 100 UNIT/ML IV SOLN
500.0000 [IU] | Freq: Once | INTRAVENOUS | Status: AC | PRN
Start: 2023-10-19 — End: 2023-10-19
  Administered 2023-10-19: 500 [IU]

## 2023-10-19 MED ORDER — SODIUM CHLORIDE 0.9 % IV SOLN: Freq: Once | INTRAVENOUS | Status: AC

## 2023-10-19 MED ORDER — FAMOTIDINE IN NACL 20-0.9 MG/50ML-% IV SOLN
20.0000 mg | Freq: Once | INTRAVENOUS | Status: AC
  Administered 2023-10-19: 20 mg via INTRAVENOUS
  Filled 2023-10-19: qty 50

## 2023-10-19 NOTE — Patient Instructions (Signed)
[facility] - A DEPT OF [facility]  Discharge Instructions: Thank you for choosing [facility] to provide your oncology and hematology care.   If you have a lab appointment with the Cancer Center, please go directly to the Cancer Center and check in at the registration area.   Wear comfortable clothing and clothing appropriate for easy access to any Portacath or PICC line.   We strive to give you quality time with your provider. You may need to reschedule your appointment if you arrive late (15 or more minutes).  Arriving late affects you and other patients whose appointments are after yours.  Also, if you miss three or more appointments without notifying the office, you may be dismissed from the clinic at the provider's discretion.      For prescription refill requests, have your pharmacy contact our office and allow 72 hours for refills to be completed.    Today you received the following chemotherapy and/or immunotherapy agents Paclitaxel (taxol)      To help prevent nausea and vomiting after your treatment, we encourage you to take your nausea medication as directed.  BELOW ARE SYMPTOMS THAT SHOULD BE REPORTED IMMEDIATELY: *FEVER GREATER THAN 100.4 F (38 C) OR HIGHER *CHILLS OR SWEATING *NAUSEA AND VOMITING THAT IS NOT CONTROLLED WITH YOUR NAUSEA MEDICATION *UNUSUAL SHORTNESS OF BREATH *UNUSUAL BRUISING OR BLEEDING *URINARY PROBLEMS (pain or burning when urinating, or frequent urination) *BOWEL PROBLEMS (unusual diarrhea, constipation, pain near the anus) TENDERNESS IN MOUTH AND THROAT WITH OR WITHOUT PRESENCE OF ULCERS (sore throat, sores in mouth, or a toothache) UNUSUAL RASH, SWELLING OR PAIN  UNUSUAL VAGINAL DISCHARGE OR ITCHING   Items with * indicate a potential emergency and should be followed up as soon as possible or go to the Emergency Department if any problems should occur.  Please show the CHEMOTHERAPY ALERT CARD or  IMMUNOTHERAPY ALERT CARD at check-in to the Emergency Department and triage nurse.  Should you have questions after your visit or need to cancel or reschedule your appointment, please contact [facility] - A DEPT OF [facility]  Dept: [phone]  and follow the prompts.  Office hours are 8:00 a.m. to 4:30 p.m. Monday - Friday. Please note that voicemails left after 4:00 p.m. may not be returned until the following business day.  We are closed weekends and major holidays. You have access to a nurse at all times for urgent questions. Please call the main number to the clinic Dept: [phone] and follow the prompts.   For any non-urgent questions, you may also contact your provider using MyChart. We now offer e-Visits for anyone 34 and older to request care online for non-urgent symptoms. For details visit [email or web address].   Also download the MyChart app! Go to the app store, search "MyChart", open the app, select [place], and log in with your MyChart username and password.  Paclitaxel Injection What is this medication? PACLITAXEL (PAK li TAX el) treats some types of cancer. It works by slowing down the growth of cancer cells. This medicine may be used for other purposes; ask your health care provider or pharmacist if you have questions. COMMON BRAND NAME(S): Onxol, Taxol What should I tell my care team before I take this medication? They need to know if you have any of these conditions: Heart disease Liver disease Low white blood cell levels An unusual or allergic reaction to paclitaxel, other medications,  foods, dyes, or preservatives If you or your partner are pregnant or trying to get pregnant Breast-feeding How should I use this medication? This medication is injected into a vein. It is given by your care team in a hospital or clinic setting. Talk to your care team about the use of this medication in children. While it may be given to children  for selected conditions, precautions do apply. Overdosage: If you think you have taken too much of this medicine contact a poison control center or emergency room at once. NOTE: This medicine is only for you. Do not share this medicine with others. What if I miss a dose? Keep appointments for follow-up doses. It is important not to miss your dose. Call your care team if you are unable to keep an appointment. What may interact with this medication? Do not take this medication with any of the following: Live virus vaccines Other medications may affect the way this medication works. Talk with your care team about all of the medications you take. They may suggest changes to your treatment plan to lower the risk of side effects and to make sure your medications work as intended. This list may not describe all possible interactions. Give your health care provider a list of all the medicines, herbs, non-prescription drugs, or dietary supplements you use. Also tell them if you smoke, drink alcohol, or use illegal drugs. Some items may interact with your medicine. What should I watch for while using this medication? Your condition will be monitored carefully while you are receiving this medication. You may need blood work while taking this medication. This medication may make you feel generally unwell. This is not uncommon as chemotherapy can affect healthy cells as well as cancer cells. Report any side effects. Continue your course of treatment even though you feel ill unless your care team tells you to stop. This medication can cause serious allergic reactions. To reduce the risk, your care team may give you other medications to take before receiving this one. Be sure to follow the directions from your care team. This medication may increase your risk of getting an infection. Call your care team for advice if you get a fever, chills, sore throat, or other symptoms of a cold or flu. Do not treat yourself. Try  to avoid being around people who are sick. This medication may increase your risk to bruise or bleed. Call your care team if you notice any unusual bleeding. Be careful brushing or flossing your teeth or using a toothpick because you may get an infection or bleed more easily. If you have any dental work done, tell your dentist you are receiving this medication. Talk to your care team if you may be pregnant. Serious birth defects can occur if you take this medication during pregnancy. Talk to your care team before breastfeeding. Changes to your treatment plan may be needed. What side effects may I notice from receiving this medication? Side effects that you should report to your care team as soon as possible: Allergic reactions--skin rash, itching, hives, swelling of the face, lips, tongue, or throat Heart rhythm changes--fast or irregular heartbeat, dizziness, feeling faint or lightheaded, chest pain, trouble breathing Increase in blood pressure Infection--fever, chills, cough, sore throat, wounds that don't heal, pain or trouble when passing urine, general feeling of discomfort or being unwell Low blood pressure--dizziness, feeling faint or lightheaded, blurry vision Low red blood cell level--unusual weakness or fatigue, dizziness, headache, trouble breathing Painful swelling, warmth, or redness  of the skin, blisters or sores at the infusion site Pain, tingling, or numbness in the hands or feet Slow heartbeat--dizziness, feeling faint or lightheaded, confusion, trouble breathing, unusual weakness or fatigue Unusual bruising or bleeding Side effects that usually do not require medical attention (report to your care team if they continue or are bothersome): Diarrhea Hair loss Joint pain Loss of appetite Muscle pain Nausea Vomiting This list may not describe all possible side effects. Call your doctor for medical advice about side effects. You may report side effects to FDA at  1-800-FDA-1088. Where should I keep my medication? This medication is given in a hospital or clinic. It will not be stored at home. NOTE: This sheet is a summary. It may not cover all possible information. If you have questions about this medicine, talk to your doctor, pharmacist, or health care provider.  2024 Elsevier/Gold Standard (2022-03-09 00:00:00)

## 2023-10-19 NOTE — Progress Notes (Signed)
Per MD okay to treat today with HR 110. ?

## 2023-10-19 NOTE — Progress Notes (Signed)
Pictures of patient's left great toe to accompany wound care referral that was placed by oncologist today.

## 2023-10-19 NOTE — Progress Notes (Signed)
Pt in infusion suite and noted to have an ulcer on left great toe.  RN reviewed with MD and verbal orders received for pt to be referred to the wound center.  Orders placed.

## 2023-10-20 ENCOUNTER — Telehealth: Payer: Self-pay

## 2023-10-20 NOTE — Telephone Encounter (Signed)
-----   Message from Nurse Karn Pickler sent at 10/19/2023  4:40 PM EST ----- Regarding: Dr. Durene Fruits tx f/u call Dr. Pamelia Hoit 1st tx f/u call - 1st time taxol (finished a/c)

## 2023-10-20 NOTE — Telephone Encounter (Signed)
Ms Kassam states that she is fairly well. She has been sleeping a good part of the day. She  has not eaten or  drunk a lot of fluids as she has been sleeping and not very hungry.  Encouraged her to take in fluids of 64/oz a day to help flush out the chemotherapy and help minimize side-effects. She has some boiled eggs that she is going to eat shortly. She is  urinating well. She knows to call the office at [phone] if  she has any questions or concerns.

## 2023-10-24 ENCOUNTER — Inpatient Hospital Stay: Payer: Medicare HMO

## 2023-10-24 ENCOUNTER — Other Ambulatory Visit: Payer: Self-pay

## 2023-10-24 DIAGNOSIS — C50912 Malignant neoplasm of unspecified site of left female breast: Secondary | ICD-10-CM

## 2023-10-25 ENCOUNTER — Inpatient Hospital Stay: Payer: Medicare HMO

## 2023-10-25 VITALS — BP 138/107 | HR 115 | Temp 98.2°F | Resp 18

## 2023-10-25 VITALS — BP 121/94 | HR 102 | Temp 98.6°F | Resp 18

## 2023-10-25 DIAGNOSIS — Z5111 Encounter for antineoplastic chemotherapy: Secondary | ICD-10-CM | POA: Diagnosis not present

## 2023-10-25 DIAGNOSIS — D509 Iron deficiency anemia, unspecified: Secondary | ICD-10-CM

## 2023-10-25 DIAGNOSIS — C50912 Malignant neoplasm of unspecified site of left female breast: Secondary | ICD-10-CM

## 2023-10-25 DIAGNOSIS — D500 Iron deficiency anemia secondary to blood loss (chronic): Secondary | ICD-10-CM

## 2023-10-25 LAB — CMP (CANCER CENTER ONLY)
ALT: 13 U/L (ref 0–44)
AST: 15 U/L (ref 15–41)
Albumin: 3.5 g/dL (ref 3.5–5.0)
Alkaline Phosphatase: 78 U/L (ref 38–126)
Anion gap: 9 (ref 5–15)
BUN: 6 mg/dL (ref 6–20)
CO2: 24 mmol/L (ref 22–32)
Calcium: 9 mg/dL (ref 8.9–10.3)
Chloride: 104 mmol/L (ref 98–111)
Creatinine: 0.92 mg/dL (ref 0.44–1.00)
GFR, Estimated: >60 mL/min (ref >60)
Glucose, Bld: 237 mg/dL — ABNORMAL HIGH (ref 70–99)
Potassium: 3.5 mmol/L (ref 3.5–5.1)
Sodium: 137 mmol/L (ref 135–145)
Total Bilirubin: 0.5 mg/dL (ref <1.2)
Total Protein: 7.1 g/dL (ref 6.5–8.1)

## 2023-10-25 LAB — CBC WITH DIFFERENTIAL (CANCER CENTER ONLY)
Abs Immature Granulocytes: 0.04 10*3/uL (ref 0.00–0.07)
Basophils Absolute: 0 10*3/uL (ref 0.0–0.1)
Basophils Relative: 0 %
Eosinophils Absolute: 0 10*3/uL (ref 0.0–0.5)
Eosinophils Relative: 2 %
HCT: 32.2 % — ABNORMAL LOW (ref 36.0–46.0)
Hemoglobin: 10.1 g/dL — ABNORMAL LOW (ref 12.0–15.0)
Immature Granulocytes: 2 %
Lymphocytes Relative: 31 %
Lymphs Abs: 0.7 10*3/uL (ref 0.7–4.0)
MCH: 23 pg — ABNORMAL LOW (ref 26.0–34.0)
MCHC: 31.4 g/dL (ref 30.0–36.0)
MCV: 73.2 fL — ABNORMAL LOW (ref 80.0–100.0)
Monocytes Absolute: 0.3 10*3/uL (ref 0.1–1.0)
Monocytes Relative: 11 %
Neutro Abs: 1.3 10*3/uL — ABNORMAL LOW (ref 1.7–7.7)
Neutrophils Relative %: 54 %
Platelet Count: 390 10*3/uL (ref 150–400)
RBC: 4.4 MIL/uL (ref 3.87–5.11)
RDW: 22.2 % — ABNORMAL HIGH (ref 11.5–15.5)
WBC Count: 2.4 10*3/uL — ABNORMAL LOW (ref 4.0–10.5)
nRBC: 0 % (ref 0.0–0.2)

## 2023-10-25 MED ORDER — HEPARIN SOD (PORK) LOCK FLUSH 100 UNIT/ML IV SOLN
250.0000 [IU] | Freq: Once | INTRAVENOUS | Status: AC | PRN
  Administered 2023-10-25: 250 [IU]

## 2023-10-25 MED ORDER — SODIUM CHLORIDE 0.9% FLUSH
10.0000 mL | Freq: Once | INTRAVENOUS | Status: AC
  Administered 2023-10-25: 10 mL

## 2023-10-25 MED ORDER — SODIUM CHLORIDE 0.9 % IV SOLN
INTRAVENOUS | Status: DC
Start: 2023-10-25 — End: 2023-10-25

## 2023-10-25 NOTE — Progress Notes (Signed)
Patient came in to day with an ANC of 1.3 and multiple complaints after her first taxol treatment. Patient complaining of ongoing diarrhea, 10/10 body pain/chest aching, nausea, poor oral intake, and heart rate of 115. Dr. Pamelia Hoit decided with input from the patient- no treatment today. Patient to follow up with MD. IV fluids today.

## 2023-10-25 NOTE — Patient Instructions (Signed)

## 2023-10-26 ENCOUNTER — Other Ambulatory Visit: Payer: Self-pay

## 2023-10-28 ENCOUNTER — Encounter: Payer: Self-pay | Admitting: Hematology and Oncology

## 2023-10-31 ENCOUNTER — Other Ambulatory Visit: Payer: Self-pay

## 2023-10-31 ENCOUNTER — Inpatient Hospital Stay: Payer: Medicare HMO

## 2023-10-31 ENCOUNTER — Inpatient Hospital Stay (HOSPITAL_BASED_OUTPATIENT_CLINIC_OR_DEPARTMENT_OTHER): Payer: Medicare HMO | Admitting: Adult Health

## 2023-10-31 VITALS — BP 127/90 | HR 95 | Temp 97.8°F | Resp 19 | Wt >= 6400 oz

## 2023-10-31 DIAGNOSIS — D500 Iron deficiency anemia secondary to blood loss (chronic): Secondary | ICD-10-CM

## 2023-10-31 DIAGNOSIS — D509 Iron deficiency anemia, unspecified: Secondary | ICD-10-CM

## 2023-10-31 DIAGNOSIS — C50912 Malignant neoplasm of unspecified site of left female breast: Secondary | ICD-10-CM | POA: Diagnosis not present

## 2023-10-31 DIAGNOSIS — C773 Secondary and unspecified malignant neoplasm of axilla and upper limb lymph nodes: Secondary | ICD-10-CM

## 2023-10-31 DIAGNOSIS — Z5111 Encounter for antineoplastic chemotherapy: Secondary | ICD-10-CM | POA: Diagnosis not present

## 2023-10-31 DIAGNOSIS — R197 Diarrhea, unspecified: Secondary | ICD-10-CM

## 2023-10-31 DIAGNOSIS — K219 Gastro-esophageal reflux disease without esophagitis: Secondary | ICD-10-CM | POA: Diagnosis not present

## 2023-10-31 LAB — CBC WITH DIFFERENTIAL (CANCER CENTER ONLY)
Abs Immature Granulocytes: 0.22 10*3/uL — ABNORMAL HIGH (ref 0.00–0.07)
Basophils Absolute: 0 10*3/uL (ref 0.0–0.1)
Basophils Relative: 0 %
Eosinophils Absolute: 0.1 10*3/uL (ref 0.0–0.5)
Eosinophils Relative: 2 %
HCT: 33.5 % — ABNORMAL LOW (ref 36.0–46.0)
Hemoglobin: 10.6 g/dL — ABNORMAL LOW (ref 12.0–15.0)
Immature Granulocytes: 4 %
Lymphocytes Relative: 19 %
Lymphs Abs: 1.2 10*3/uL (ref 0.7–4.0)
MCH: 23 pg — ABNORMAL LOW (ref 26.0–34.0)
MCHC: 31.6 g/dL (ref 30.0–36.0)
MCV: 72.8 fL — ABNORMAL LOW (ref 80.0–100.0)
Monocytes Absolute: 0.7 10*3/uL (ref 0.1–1.0)
Monocytes Relative: 11 %
Neutro Abs: 4.1 10*3/uL (ref 1.7–7.7)
Neutrophils Relative %: 64 %
Platelet Count: 434 10*3/uL — ABNORMAL HIGH (ref 150–400)
RBC: 4.6 MIL/uL (ref 3.87–5.11)
RDW: 21.7 % — ABNORMAL HIGH (ref 11.5–15.5)
WBC Count: 6.3 10*3/uL (ref 4.0–10.5)
nRBC: 0.6 % — ABNORMAL HIGH (ref 0.0–0.2)

## 2023-10-31 LAB — CMP (CANCER CENTER ONLY)
ALT: 12 U/L (ref 0–44)
AST: 19 U/L (ref 15–41)
Albumin: 3.5 g/dL (ref 3.5–5.0)
Alkaline Phosphatase: 85 U/L (ref 38–126)
Anion gap: 10 (ref 5–15)
BUN: 7 mg/dL (ref 6–20)
CO2: 25 mmol/L (ref 22–32)
Calcium: 9.1 mg/dL (ref 8.9–10.3)
Chloride: 106 mmol/L (ref 98–111)
Creatinine: 0.83 mg/dL (ref 0.44–1.00)
GFR, Estimated: >60 mL/min (ref >60)
Glucose, Bld: 143 mg/dL — ABNORMAL HIGH (ref 70–99)
Potassium: 3.3 mmol/L — ABNORMAL LOW (ref 3.5–5.1)
Sodium: 141 mmol/L (ref 135–145)
Total Bilirubin: 0.5 mg/dL (ref 0.0–1.2)
Total Protein: 7.3 g/dL (ref 6.5–8.1)

## 2023-10-31 MED ORDER — SODIUM CHLORIDE 0.9 % IV SOLN
Freq: Once | INTRAVENOUS | Status: AC
Start: 2023-10-31 — End: 2023-10-31

## 2023-10-31 MED ORDER — OXYCODONE HCL 5 MG PO TABS
20.0000 mg | ORAL_TABLET | Freq: Once | ORAL | Status: AC
  Administered 2023-10-31: 20 mg via ORAL
  Filled 2023-10-31: qty 4

## 2023-10-31 MED ORDER — SODIUM CHLORIDE 0.9% FLUSH
10.0000 mL | Freq: Once | INTRAVENOUS | Status: AC
Start: 2023-10-31 — End: 2023-10-31
  Administered 2023-10-31: 10 mL

## 2023-10-31 MED ORDER — SODIUM CHLORIDE 0.9% FLUSH
10.0000 mL | Freq: Once | INTRAVENOUS | Status: AC
  Administered 2023-10-31: 10 mL

## 2023-10-31 MED ORDER — GABAPENTIN 100 MG PO CAPS: 100.0000 mg | ORAL_CAPSULE | Freq: Three times a day (TID) | ORAL | 0 refills | Status: DC

## 2023-10-31 MED ORDER — OMEPRAZOLE 40 MG PO CPDR: 40.0000 mg | DELAYED_RELEASE_CAPSULE | Freq: Two times a day (BID) | ORAL | 0 refills | Status: DC

## 2023-10-31 MED ORDER — HEPARIN SOD (PORK) LOCK FLUSH 100 UNIT/ML IV SOLN
500.0000 [IU] | Freq: Once | INTRAVENOUS | Status: AC
Start: 2023-10-31 — End: 2023-10-31
  Administered 2023-10-31: 500 [IU] via INTRAVENOUS

## 2023-10-31 NOTE — Progress Notes (Addendum)
Pt in infusion today with c/o nausea, diarrhea, inability to eat, weight loss, numbness/tingling in bilateral feet that she states is making it difficult to walk, dizziness, headache & back pain. Per Lillard Anes, NP holding treatment today and giving IVF and pain medications only. Mardella Layman, NP to chairside to assess pt.

## 2023-10-31 NOTE — Patient Instructions (Signed)

## 2023-11-01 ENCOUNTER — Other Ambulatory Visit: Payer: Self-pay

## 2023-11-02 ENCOUNTER — Other Ambulatory Visit: Payer: Self-pay

## 2023-11-03 ENCOUNTER — Encounter: Payer: Self-pay | Admitting: Hematology and Oncology

## 2023-11-03 NOTE — Assessment & Plan Note (Addendum)
 07/12/2023: Premenopausal patient palpated a lump in the left axilla. Mammogram did not reveal any primary breast tumor. 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 07/27/2023: Bone scan: No evidence of skeletal metastasis   Treatment plan: Neoadjuvant chemotherapy with dose dense Adriamycin  and Cytoxan  x 4 followed by Taxol  weekly x 12 Breast conserving surgery with targeted node dissection at [facility] Adjuvant radiation therapy Adjuvant antiestrogen therapy with CDK inhibitors ------------------------------------------------------------------------------------------------------------------- Current treatment: Completed 4 cycles of dose dense Adriamycin  and Cytoxan , today cycle 2 Taxol  Chemo toxicities: Chemotherapy-induced gastritis Epigastric pain and nausea after eating, possibly due to increased acidity from chemotherapy. No current proton pump inhibitor (PPI) use. -IV fluids today. -Start PPI twice daily, emphasizing morning dose on an empty stomach. -If no improvement by end of week, patient to call for possible addition of another medication.  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 Reports being out of gabapentin  for two months. -Resume gabapentin  for neuropathic pain.  Chemotherapy Current symptoms and side effects discussed. Labs are satisfactory. -Hold chemotherapy for a week to manage symptoms. -Plan to reassess next week for possible dose reduction or early cessation.  Hypokalemia Slightly low potassium level, possibly due to diarrhea. -Encourage potassium-rich foods. -If diarrhea continues, patient to call office.  RTC in 1 week for labs, f/u with Dr. Gudena, and her next treatment.

## 2023-11-07 ENCOUNTER — Inpatient Hospital Stay: Payer: Medicare HMO

## 2023-11-07 ENCOUNTER — Telehealth: Payer: Self-pay | Admitting: *Deleted

## 2023-11-07 ENCOUNTER — Inpatient Hospital Stay: Payer: Medicare HMO | Admitting: Hematology and Oncology

## 2023-11-07 NOTE — Assessment & Plan Note (Deleted)
 07/12/2023: Premenopausal patient palpated a lump in the left axilla. Mammogram did not reveal any primary breast tumor. 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 07/27/2023: Bone scan: No evidence of skeletal metastasis   Treatment plan: Neoadjuvant chemotherapy with dose dense Adriamycin  and Cytoxan  x 4 followed by Taxol  weekly x 12 Breast conserving surgery with targeted node dissection at [facility] Adjuvant radiation therapy Adjuvant antiestrogen therapy with CDK inhibitors ------------------------------------------------------------------------------------------------------------------- Current treatment: Completed 4 cycles of dose dense Adriamycin  and Cytoxan , cycle 2 Taxol  Chemo toxicities: Nailbed discoloration Chemo-induced anemia: Monitoring closely Thrombosis involving the port Alopecia Chemo induced gastritis: Started PPI therapy Neuropathic pain: History of sciatica and spinal stenosis previously on gabapentin , resumed gabapentin  Hypokalemia: Encourage potassium rich foods.  Possibly due to diarrhea.   Return to clinic weekly for chemo and every other week for follow-up with me

## 2023-11-07 NOTE — Telephone Encounter (Signed)
 Pt called this am to make office aware of rescheduling due to pain. Infusion made aware and scheduling messages for rescheduling has been sent

## 2023-11-08 ENCOUNTER — Other Ambulatory Visit: Payer: Self-pay

## 2023-11-10 ENCOUNTER — Encounter: Payer: Self-pay | Admitting: Hematology and Oncology

## 2023-11-14 ENCOUNTER — Inpatient Hospital Stay: Payer: Medicare HMO

## 2023-11-14 ENCOUNTER — Inpatient Hospital Stay: Payer: Medicare HMO | Attending: Physician Assistant

## 2023-11-14 VITALS — BP 127/74 | HR 101 | Temp 98.0°F | Resp 18 | Wt >= 6400 oz

## 2023-11-14 DIAGNOSIS — Z833 Family history of diabetes mellitus: Secondary | ICD-10-CM | POA: Insufficient documentation

## 2023-11-14 DIAGNOSIS — Z825 Family history of asthma and other chronic lower respiratory diseases: Secondary | ICD-10-CM | POA: Diagnosis not present

## 2023-11-14 DIAGNOSIS — Z9049 Acquired absence of other specified parts of digestive tract: Secondary | ICD-10-CM | POA: Insufficient documentation

## 2023-11-14 DIAGNOSIS — Z1721 Progesterone receptor positive status: Secondary | ICD-10-CM | POA: Insufficient documentation

## 2023-11-14 DIAGNOSIS — Z170 Estrogen receptor positive status [ER+]: Secondary | ICD-10-CM | POA: Insufficient documentation

## 2023-11-14 DIAGNOSIS — Z86718 Personal history of other venous thrombosis and embolism: Secondary | ICD-10-CM | POA: Insufficient documentation

## 2023-11-14 DIAGNOSIS — Z806 Family history of leukemia: Secondary | ICD-10-CM | POA: Diagnosis not present

## 2023-11-14 DIAGNOSIS — Z803 Family history of malignant neoplasm of breast: Secondary | ICD-10-CM | POA: Diagnosis not present

## 2023-11-14 DIAGNOSIS — Z8249 Family history of ischemic heart disease and other diseases of the circulatory system: Secondary | ICD-10-CM | POA: Insufficient documentation

## 2023-11-14 DIAGNOSIS — Z86711 Personal history of pulmonary embolism: Secondary | ICD-10-CM | POA: Insufficient documentation

## 2023-11-14 DIAGNOSIS — C773 Secondary and unspecified malignant neoplasm of axilla and upper limb lymph nodes: Secondary | ICD-10-CM | POA: Diagnosis not present

## 2023-11-14 DIAGNOSIS — C50912 Malignant neoplasm of unspecified site of left female breast: Secondary | ICD-10-CM | POA: Insufficient documentation

## 2023-11-14 DIAGNOSIS — Z79633 Long term (current) use of mitotic inhibitor: Secondary | ICD-10-CM | POA: Diagnosis not present

## 2023-11-14 DIAGNOSIS — D500 Iron deficiency anemia secondary to blood loss (chronic): Secondary | ICD-10-CM

## 2023-11-14 DIAGNOSIS — Z5111 Encounter for antineoplastic chemotherapy: Secondary | ICD-10-CM | POA: Diagnosis present

## 2023-11-14 DIAGNOSIS — Z79899 Other long term (current) drug therapy: Secondary | ICD-10-CM | POA: Insufficient documentation

## 2023-11-14 DIAGNOSIS — D509 Iron deficiency anemia, unspecified: Secondary | ICD-10-CM

## 2023-11-14 LAB — CBC WITH DIFFERENTIAL (CANCER CENTER ONLY)
Abs Immature Granulocytes: 0.01 10*3/uL (ref 0.00–0.07)
Basophils Absolute: 0 10*3/uL (ref 0.0–0.1)
Basophils Relative: 0 %
Eosinophils Absolute: 0.2 10*3/uL (ref 0.0–0.5)
Eosinophils Relative: 5 %
HCT: 31.4 % — ABNORMAL LOW (ref 36.0–46.0)
Hemoglobin: 9.9 g/dL — ABNORMAL LOW (ref 12.0–15.0)
Immature Granulocytes: 0 %
Lymphocytes Relative: 21 %
Lymphs Abs: 0.9 10*3/uL (ref 0.7–4.0)
MCH: 23.6 pg — ABNORMAL LOW (ref 26.0–34.0)
MCHC: 31.5 g/dL (ref 30.0–36.0)
MCV: 74.9 fL — ABNORMAL LOW (ref 80.0–100.0)
Monocytes Absolute: 0.4 10*3/uL (ref 0.1–1.0)
Monocytes Relative: 10 %
Neutro Abs: 2.7 10*3/uL (ref 1.7–7.7)
Neutrophils Relative %: 64 %
Platelet Count: 351 10*3/uL (ref 150–400)
RBC: 4.19 MIL/uL (ref 3.87–5.11)
RDW: 21 % — ABNORMAL HIGH (ref 11.5–15.5)
WBC Count: 4.3 10*3/uL (ref 4.0–10.5)
nRBC: 0 % (ref 0.0–0.2)

## 2023-11-14 LAB — CMP (CANCER CENTER ONLY)
ALT: 11 U/L (ref 0–44)
AST: 17 U/L (ref 15–41)
Albumin: 3.4 g/dL — ABNORMAL LOW (ref 3.5–5.0)
Alkaline Phosphatase: 71 U/L (ref 38–126)
Anion gap: 8 (ref 5–15)
BUN: 8 mg/dL (ref 6–20)
CO2: 26 mmol/L (ref 22–32)
Calcium: 9.1 mg/dL (ref 8.9–10.3)
Chloride: 102 mmol/L (ref 98–111)
Creatinine: 0.79 mg/dL (ref 0.44–1.00)
GFR, Estimated: >60 mL/min (ref >60)
Glucose, Bld: 179 mg/dL — ABNORMAL HIGH (ref 70–99)
Potassium: 3.5 mmol/L (ref 3.5–5.1)
Sodium: 136 mmol/L (ref 135–145)
Total Bilirubin: 0.5 mg/dL (ref 0.0–1.2)
Total Protein: 6.7 g/dL (ref 6.5–8.1)

## 2023-11-14 LAB — PREGNANCY, URINE: Preg Test, Ur: NEGATIVE

## 2023-11-14 MED ORDER — SODIUM CHLORIDE 0.9 % IV SOLN: Freq: Once | INTRAVENOUS | Status: AC

## 2023-11-14 MED ORDER — DIPHENHYDRAMINE HCL 50 MG/ML IJ SOLN
25.0000 mg | Freq: Once | INTRAMUSCULAR | Status: AC
  Administered 2023-11-14: 25 mg via INTRAVENOUS
  Filled 2023-11-14: qty 1

## 2023-11-14 MED ORDER — PACLITAXEL CHEMO INJECTION 300 MG/50ML
80.0000 mg/m2 | Freq: Once | INTRAVENOUS | Status: AC
  Administered 2023-11-14: 246 mg via INTRAVENOUS
  Filled 2023-11-14: qty 41

## 2023-11-14 MED ORDER — FAMOTIDINE IN NACL 20-0.9 MG/50ML-% IV SOLN
20.0000 mg | Freq: Once | INTRAVENOUS | Status: AC
  Administered 2023-11-14: 20 mg via INTRAVENOUS
  Filled 2023-11-14: qty 50

## 2023-11-14 MED ORDER — SODIUM CHLORIDE 0.9% FLUSH
10.0000 mL | Freq: Once | INTRAVENOUS | Status: DC
Start: 2023-11-14 — End: 2023-11-14

## 2023-11-14 MED ORDER — SODIUM CHLORIDE 0.9% FLUSH: 10.0000 mL | INTRAVENOUS | Status: DC | PRN

## 2023-11-14 MED ORDER — DEXAMETHASONE SODIUM PHOSPHATE 10 MG/ML IJ SOLN
10.0000 mg | Freq: Once | INTRAMUSCULAR | Status: AC
Start: 2023-11-14 — End: 2023-11-14
  Administered 2023-11-14: 10 mg via INTRAVENOUS
  Filled 2023-11-14: qty 1

## 2023-11-14 MED ORDER — HEPARIN SOD (PORK) LOCK FLUSH 100 UNIT/ML IV SOLN
500.0000 [IU] | Freq: Once | INTRAVENOUS | Status: DC | PRN
Start: 2023-11-14 — End: 2023-11-14

## 2023-11-18 ENCOUNTER — Encounter: Payer: Self-pay | Admitting: Hematology and Oncology

## 2023-11-21 ENCOUNTER — Encounter: Payer: Self-pay | Admitting: Hematology and Oncology

## 2023-11-21 ENCOUNTER — Encounter: Payer: Self-pay | Admitting: *Deleted

## 2023-11-21 ENCOUNTER — Inpatient Hospital Stay: Payer: Medicare HMO

## 2023-11-21 ENCOUNTER — Inpatient Hospital Stay (HOSPITAL_BASED_OUTPATIENT_CLINIC_OR_DEPARTMENT_OTHER): Payer: Medicare HMO | Admitting: Adult Health

## 2023-11-21 ENCOUNTER — Encounter: Payer: Self-pay | Admitting: Adult Health

## 2023-11-21 VITALS — BP 130/79

## 2023-11-21 VITALS — BP 164/108 | HR 98 | Temp 98.4°F | Resp 18 | Wt >= 6400 oz

## 2023-11-21 DIAGNOSIS — C773 Secondary and unspecified malignant neoplasm of axilla and upper limb lymph nodes: Secondary | ICD-10-CM | POA: Diagnosis not present

## 2023-11-21 DIAGNOSIS — C50912 Malignant neoplasm of unspecified site of left female breast: Secondary | ICD-10-CM

## 2023-11-21 DIAGNOSIS — Z5111 Encounter for antineoplastic chemotherapy: Secondary | ICD-10-CM | POA: Diagnosis not present

## 2023-11-21 DIAGNOSIS — D509 Iron deficiency anemia, unspecified: Secondary | ICD-10-CM

## 2023-11-21 DIAGNOSIS — D500 Iron deficiency anemia secondary to blood loss (chronic): Secondary | ICD-10-CM

## 2023-11-21 LAB — CBC WITH DIFFERENTIAL (CANCER CENTER ONLY)
Abs Immature Granulocytes: 0.01 10*3/uL (ref 0.00–0.07)
Basophils Absolute: 0 10*3/uL (ref 0.0–0.1)
Basophils Relative: 0 %
Eosinophils Absolute: 0.1 10*3/uL (ref 0.0–0.5)
Eosinophils Relative: 2 %
HCT: 31.7 % — ABNORMAL LOW (ref 36.0–46.0)
Hemoglobin: 10.2 g/dL — ABNORMAL LOW (ref 12.0–15.0)
Immature Granulocytes: 0 %
Lymphocytes Relative: 29 %
Lymphs Abs: 0.9 10*3/uL (ref 0.7–4.0)
MCH: 23.4 pg — ABNORMAL LOW (ref 26.0–34.0)
MCHC: 32.2 g/dL (ref 30.0–36.0)
MCV: 72.9 fL — ABNORMAL LOW (ref 80.0–100.0)
Monocytes Absolute: 0.2 10*3/uL (ref 0.1–1.0)
Monocytes Relative: 6 %
Neutro Abs: 1.9 10*3/uL (ref 1.7–7.7)
Neutrophils Relative %: 63 %
Platelet Count: 360 10*3/uL (ref 150–400)
RBC: 4.35 MIL/uL (ref 3.87–5.11)
RDW: 19.9 % — ABNORMAL HIGH (ref 11.5–15.5)
WBC Count: 3.1 10*3/uL — ABNORMAL LOW (ref 4.0–10.5)
nRBC: 0 % (ref 0.0–0.2)

## 2023-11-21 LAB — CMP (CANCER CENTER ONLY)
ALT: 14 U/L (ref 0–44)
AST: 16 U/L (ref 15–41)
Albumin: 3.6 g/dL (ref 3.5–5.0)
Alkaline Phosphatase: 74 U/L (ref 38–126)
Anion gap: 7 (ref 5–15)
BUN: 9 mg/dL (ref 6–20)
CO2: 25 mmol/L (ref 22–32)
Calcium: 9.4 mg/dL (ref 8.9–10.3)
Chloride: 104 mmol/L (ref 98–111)
Creatinine: 0.8 mg/dL (ref 0.44–1.00)
GFR, Estimated: >60 mL/min (ref >60)
Glucose, Bld: 193 mg/dL — ABNORMAL HIGH (ref 70–99)
Potassium: 3.7 mmol/L (ref 3.5–5.1)
Sodium: 136 mmol/L (ref 135–145)
Total Bilirubin: 0.5 mg/dL (ref 0.0–1.2)
Total Protein: 7 g/dL (ref 6.5–8.1)

## 2023-11-21 MED ORDER — SODIUM CHLORIDE 0.9% FLUSH
10.0000 mL | Freq: Once | INTRAVENOUS | Status: AC
  Administered 2023-11-21: 10 mL

## 2023-11-21 MED ORDER — SODIUM CHLORIDE 0.9 % IV SOLN: Freq: Once | INTRAVENOUS | Status: AC

## 2023-11-21 MED ORDER — DIPHENHYDRAMINE HCL 50 MG/ML IJ SOLN
25.0000 mg | Freq: Once | INTRAMUSCULAR | Status: AC
  Administered 2023-11-21: 25 mg via INTRAVENOUS
  Filled 2023-11-21: qty 1

## 2023-11-21 MED ORDER — SODIUM CHLORIDE 0.9% FLUSH
10.0000 mL | INTRAVENOUS | Status: DC | PRN
Start: 2023-11-21 — End: 2023-11-21
  Administered 2023-11-21: 10 mL

## 2023-11-21 MED ORDER — DEXAMETHASONE SODIUM PHOSPHATE 10 MG/ML IJ SOLN
10.0000 mg | Freq: Once | INTRAMUSCULAR | Status: AC
  Administered 2023-11-21: 10 mg via INTRAVENOUS
  Filled 2023-11-21: qty 1

## 2023-11-21 MED ORDER — SODIUM CHLORIDE 0.9 % IV SOLN
80.0000 mg/m2 | Freq: Once | INTRAVENOUS | Status: AC
  Administered 2023-11-21: 246 mg via INTRAVENOUS
  Filled 2023-11-21: qty 41

## 2023-11-21 MED ORDER — HEPARIN SOD (PORK) LOCK FLUSH 100 UNIT/ML IV SOLN
500.0000 [IU] | Freq: Once | INTRAVENOUS | Status: AC | PRN
  Administered 2023-11-21: 500 [IU]

## 2023-11-21 MED ORDER — FAMOTIDINE IN NACL 20-0.9 MG/50ML-% IV SOLN
20.0000 mg | Freq: Once | INTRAVENOUS | Status: AC
  Administered 2023-11-21: 20 mg via INTRAVENOUS
  Filled 2023-11-21: qty 50

## 2023-11-21 NOTE — Progress Notes (Signed)
[facility] Cancer Follow up:    [place] [address] [place] [address]   DIAGNOSIS:  Cancer Staging  No matching staging information was found for the patient.   SUMMARY OF ONCOLOGIC HISTORY: Oncology History  Breast cancer metastasized to axillary lymph node, left (HCC)  07/12/2023 Initial Diagnosis   Patient palpated a lump in the left axilla.  Mammogram did not reveal any primary breast tumor.  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+   08/15/2023 -  Chemotherapy   Patient is on Treatment Plan : BREAST ADJUVANT DOSE DENSE AC q14d / PACLitaxel q7d       CURRENT THERAPY: Taxol  INTERVAL HISTORY:   Discussed the use of AI scribe software for clinical note transcription with the patient, who gave verbal consent to proceed.  [name] 48 y.o. female with a history of hypertension and cancer, presents for a routine chemotherapy appointment for weekly Taxol. She reports a 'dull chest pain' that occurred yesterday and last night, but resolved after a shower. She denies any associated shortness of breath. She also reports a headache yesterday, but no vision issues. She has been experiencing some fatigue, but denies any numbness or tingling in the fingertips or toes. She has been tolerating the chemotherapy well, with the first dose being the most difficult. She has been active, cleaning and washing clothes. She does not have a blood pressure monitor at home.   Patient Active Problem List   Diagnosis Date Noted   Breast cancer metastasized to axillary lymph node, left (HCC) 07/19/2023   Menorrhagia 03/01/2018   Fibroids 03/01/2018   Obesity, morbid (HCC) 12/24/2013   Obesity hypoventilation syndrome (HCC) 12/24/2013   Iron deficiency anemia 09/29/2012   Pulmonary embolism (HCC) 09/26/2012   SOB (shortness of breath) 09/25/2012   Anemia 09/25/2012   PALPITATIONS 03/10/2010   DEPRESSION 12/09/2009    MENORRHAGIA 12/09/2009   Disorder of bursae and tendons in shoulder region 12/09/2009   PANIC DISORDER 07/17/2009   LUMBAR RADICULOPATHY, LEFT 07/17/2009   SLEEP APNEA, OBSTRUCTIVE 11/05/2008   PERIPHERAL EDEMA 11/05/2008   URINALYSIS, ABNORMAL 11/05/2008   Morbid obesity (HCC) 06/09/2007   HYPERTENSION, BENIGN ESSENTIAL 06/09/2007   Allergic rhinitis 06/09/2007   Asthma 06/09/2007   URTICARIA 06/09/2007   ANXIETY DISORDER, HX OF 06/09/2007   DEPRESSION, HX OF 06/09/2007   HYPERGLYCEMIA 06/02/2007   CYST, VULVA 07/25/2006   ANEMIA, IRON DEFICIENCY 12/28/2005   ECHOCARDIOGRAM, ABNORMAL 01/25/2005   Personal history of venous thrombosis and embolism 01/24/2005    is allergic to doxycycline, fish allergy, fish oil, other, pollen extract, citalopram, amoxicillin, penicillins, and strawberry extract.  MEDICAL HISTORY: Past Medical History:  Diagnosis Date   Allergic rhinitis    Anemia    Anxiety disorder    Asthma    Benign essential hypertension    Depression    Diabetes mellitus without complication (HCC)    Edema    History of blood clots    Hyperglycemia    Lumbar radiculopathy    Metrorrhagia    Morbid obesity (HCC)    OSA (obstructive sleep apnea)    Sickle cell trait (HCC)    Snoring     SURGICAL HISTORY: Past Surgical History:  Procedure Laterality Date   CESAREAN SECTION     CHOLECYSTECTOMY     IR IMAGING GUIDED PORT INSERTION  08/08/2023   IR US SOFT TISSUE NECK  09/21/2023  TUBAL LIGATION      SOCIAL HISTORY: Social History   Socioeconomic History   Marital status: Single    Spouse name: Not on file   Number of children: 3   Years of education: 16   Highest education level: Not on file  Occupational History   Not on file  Tobacco Use   Smoking status: Never   Smokeless tobacco: Never  Vaping Use   Vaping status: Never Used  Substance and Sexual Activity   Alcohol use: Not Currently   Drug use: No   Sexual activity: Yes    Partners: Male     Birth control/protection: Surgical  Other Topics Concern   Not on file  Social History Narrative   Patient is single and lives with her children.   Patient has three children.   Patient is currently unemployed.   Patient is a Holiday representative in college now.   Patient is right-handed.   Patient drinks one cup of tea every other day.   Social Drivers of Corporate investment banker Strain: Not on file  Food Insecurity: Not on file  Transportation Needs: Not on file  Physical Activity: Not on file  Stress: Not on file  Social Connections: Unknown (06/07/2023)   Received from [facility]   Social Network    Social Network: Not on file  Intimate Partner Violence: Unknown (06/07/2023)   Received from [facility]   HITS    Physically Hurt: Not on file    Insult or Talk Down To: Not on file    Threaten Physical Harm: Not on file    Scream or Curse: Not on file    FAMILY HISTORY: Family History  Problem Relation Age of Onset   Asthma Mother    Hypertension Mother    Leukemia Father    Diabetes Father    Breast cancer Maternal Aunt    Breast cancer Paternal Aunt    Breast cancer Cousin    Diabetes Brother     Review of Systems  Constitutional:  Positive for fatigue. Negative for appetite change, chills, fever and unexpected weight change.  HENT:   Negative for hearing loss, lump/mass and trouble swallowing.   Eyes:  Negative for eye problems and icterus.  Respiratory:  Negative for chest tightness, cough and shortness of breath.   Cardiovascular:  Negative for chest pain, leg swelling and palpitations.  Gastrointestinal:  Negative for abdominal distention, abdominal pain, constipation, diarrhea, nausea and vomiting.  Endocrine: Negative for hot flashes.  Genitourinary:  Negative for difficulty urinating.   Musculoskeletal:  Negative for arthralgias.  Skin:  Negative for itching and rash.  Neurological:  Negative for dizziness, extremity weakness, headaches and numbness.   Hematological:  Negative for adenopathy. Does not bruise/bleed easily.  Psychiatric/Behavioral:  Negative for depression. The patient is not nervous/anxious.       PHYSICAL EXAMINATION    Vitals:   11/21/23 1115 11/21/23 1116  BP: (!) 158/100 (!) 164/108  Pulse: 98   Resp: 18   Temp: 98.4 F (36.9 C)   SpO2: 100%     Physical Exam Constitutional:      General: She is not in acute distress.    Appearance: Normal appearance. She is not toxic-appearing.  HENT:     Head: Normocephalic and atraumatic.     Mouth/Throat:     Mouth: Mucous membranes are moist.     Pharynx: Oropharynx is clear. No oropharyngeal exudate or posterior oropharyngeal erythema.  Eyes:  General: No scleral icterus. Cardiovascular:     Rate and Rhythm: Normal rate and regular rhythm.     Pulses: Normal pulses.     Heart sounds: Normal heart sounds.  Pulmonary:     Effort: Pulmonary effort is normal.     Breath sounds: Normal breath sounds.  Abdominal:     General: Abdomen is flat. Bowel sounds are normal. There is no distension.     Palpations: Abdomen is soft.     Tenderness: There is no abdominal tenderness.  Musculoskeletal:        General: No swelling.     Cervical back: Neck supple.  Lymphadenopathy:     Cervical: No cervical adenopathy.  Skin:    General: Skin is warm and dry.     Findings: No rash.  Neurological:     General: No focal deficit present.     Mental Status: She is alert.  Psychiatric:        Mood and Affect: Mood normal.        Behavior: Behavior normal.     LABORATORY DATA:  CBC    Component Value Date/Time   WBC 3.1 (L) 11/21/2023 1044   WBC 8.5 09/21/2023 0022   RBC 4.35 11/21/2023 1044   HGB 10.2 (L) 11/21/2023 1044   HCT 31.7 (L) 11/21/2023 1044   PLT 360 11/21/2023 1044   MCV 72.9 (L) 11/21/2023 1044   MCH 23.4 (L) 11/21/2023 1044   MCHC 32.2 11/21/2023 1044   RDW 19.9 (H) 11/21/2023 1044   LYMPHSABS 0.9 11/21/2023 1044   MONOABS 0.2 11/21/2023  1044   EOSABS 0.1 11/21/2023 1044   BASOSABS 0.0 11/21/2023 1044    CMP     Component Value Date/Time   NA 136 11/21/2023 1044   K 3.7 11/21/2023 1044   CL 104 11/21/2023 1044   CO2 25 11/21/2023 1044   GLUCOSE 193 (H) 11/21/2023 1044   BUN 9 11/21/2023 1044   CREATININE 0.80 11/21/2023 1044   CALCIUM 9.4 11/21/2023 1044   PROT 7.0 11/21/2023 1044   ALBUMIN 3.6 11/21/2023 1044   AST 16 11/21/2023 1044   ALT 14 11/21/2023 1044   ALKPHOS 74 11/21/2023 1044   BILITOT 0.5 11/21/2023 1044   GFRNONAA >60 11/21/2023 1044   GFRAA >60 06/29/2020 2341         ASSESSMENT and THERAPY PLAN:   Breast cancer metastasized to axillary lymph node, left (HCC) 07/12/2023: Premenopausal patient palpated a lump in the left axilla. Mammogram did not reveal any primary breast tumor. 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 07/27/2023: Bone scan: No evidence of skeletal metastasis   Treatment plan: Neoadjuvant chemotherapy with dose dense Adriamycin and Cytoxan x 4 followed by Taxol weekly x 12 Breast conserving surgery with targeted node dissection at [facility] Adjuvant radiation therapy Adjuvant antiestrogen therapy with CDK inhibitors ------------------------------------------------------------------------------------------------------------------- Current treatment: Completed 4 cycles of dose dense Adriamycin and Cytoxan, today cycle 3 Taxol Chemo toxicities:  Breast Cancer on Taxol Tolerating treatment well with no significant side effects. No numbness or tingling. Reports feeling a little weak. -Continue Taxol as planned without dose modifications as labs are within treatment parameters  Hypertension Patient reports elevated blood pressure. Currently on medication. -Recheck blood pressure and adjust medication as  necessary. -I sent a message to stacey Hammer to see if she can help [place]  with providing patient with at at home monitor  Transient Chest Pain Dull chest pain reported yesterday and last night, but resolved after showering. No associated shortness of breath. -Monitor for recurrence and consider further evaluation if symptoms persist.  Headache Single episode of headache reported yesterday. -Monitor for recurrence and consider further evaluation if headaches become frequent or severe.  All questions were answered. The patient knows to call the clinic with any problems, questions or concerns. We can certainly see the patient much sooner if necessary.  Total encounter time:20 minutes*in face-to-face visit time, chart review, lab review, care coordination, order entry, and documentation of the encounter time.  Lillard Anes, NP 11/21/23 12:29 PM Medical Oncology and Hematology [facility] [address] [place], [place] [address] Tel. [phone]    Fax. [phone]  *Total Encounter Time as defined by the Centers for Medicare and Medicaid Services includes, in addition to the face-to-face time of a patient visit (documented in the note above) non-face-to-face time: obtaining and reviewing outside history, ordering and reviewing medications, tests or procedures, care coordination (communications with other health care professionals or caregivers) and documentation in the medical record.

## 2023-11-21 NOTE — Assessment & Plan Note (Signed)
07/12/2023: Premenopausal patient palpated a lump in the left axilla. Mammogram did not reveal any primary breast tumor. 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 07/27/2023: Bone scan: No evidence of skeletal metastasis   Treatment plan: Neoadjuvant chemotherapy with dose dense Adriamycin and Cytoxan x 4 followed by Taxol weekly x 12 Breast conserving surgery with targeted node dissection at [facility] Adjuvant radiation therapy Adjuvant antiestrogen therapy with CDK inhibitors ------------------------------------------------------------------------------------------------------------------- Current treatment: Completed 4 cycles of dose dense Adriamycin and Cytoxan, today cycle 3 Taxol Chemo toxicities:  Breast Cancer on Taxol Tolerating treatment well with no significant side effects. No numbness or tingling. Reports feeling a little weak. -Continue Taxol as planned without dose modifications as labs are within treatment parameters  Hypertension Patient reports elevated blood pressure. Currently on medication. -Recheck blood pressure and adjust medication as necessary. -I sent a message to stacey Hammer to see if she can help [place] with providing patient with at at home monitor  Transient Chest Pain Dull chest pain reported yesterday and last night, but resolved after showering. No associated shortness of breath. -Monitor for recurrence and consider further evaluation if symptoms persist.  Headache Single episode of headache reported yesterday. -Monitor for recurrence and consider further evaluation if headaches become frequent or severe.

## 2023-11-21 NOTE — Patient Instructions (Signed)
 [facility] - A DEPT OF [facility]  Discharge Instructions: Thank you for choosing [facility] to provide your oncology and hematology care.   If you have a lab appointment with the Cancer Center, please go directly to the Cancer Center and check in at the registration area.   Wear comfortable clothing and clothing appropriate for easy access to any Portacath or PICC line.   We strive to give you quality time with your provider. You may need to reschedule your appointment if you arrive late (15 or more minutes).  Arriving late affects you and other patients whose appointments are after yours.  Also, if you miss three or more appointments without notifying the office, you may be dismissed from the clinic at the provider's discretion.      For prescription refill requests, have your pharmacy contact our office and allow 72 hours for refills to be completed.    Today you received the following chemotherapy and/or immunotherapy agents: Taxol      To help prevent nausea and vomiting after your treatment, we encourage you to take your nausea medication as directed.  BELOW ARE SYMPTOMS THAT SHOULD BE REPORTED IMMEDIATELY: *FEVER GREATER THAN 100.4 F (38 C) OR HIGHER *CHILLS OR SWEATING *NAUSEA AND VOMITING THAT IS NOT CONTROLLED WITH YOUR NAUSEA MEDICATION *UNUSUAL SHORTNESS OF BREATH *UNUSUAL BRUISING OR BLEEDING *URINARY PROBLEMS (pain or burning when urinating, or frequent urination) *BOWEL PROBLEMS (unusual diarrhea, constipation, pain near the anus) TENDERNESS IN MOUTH AND THROAT WITH OR WITHOUT PRESENCE OF ULCERS (sore throat, sores in mouth, or a toothache) UNUSUAL RASH, SWELLING OR PAIN  UNUSUAL VAGINAL DISCHARGE OR ITCHING   Items with * indicate a potential emergency and should be followed up as soon as possible or go to the Emergency Department if any problems should occur.  Please show the CHEMOTHERAPY ALERT CARD or IMMUNOTHERAPY  ALERT CARD at check-in to the Emergency Department and triage nurse.  Should you have questions after your visit or need to cancel or reschedule your appointment, please contact [facility] - A DEPT OF [facility]  Dept: [phone]  and follow the prompts.  Office hours are 8:00 a.m. to 4:30 p.m. Monday - Friday. Please note that voicemails left after 4:00 p.m. may not be returned until the following business day.  We are closed weekends and major holidays. You have access to a nurse at all times for urgent questions. Please call the main number to the clinic Dept: [phone] and follow the prompts.   For any non-urgent questions, you may also contact your provider using MyChart. We now offer e-Visits for anyone 48 and older to request care online for non-urgent symptoms. For details visit [email or web address].   Also download the MyChart app! Go to the app store, search "MyChart", open the app, select [place], and log in with your MyChart username and password.

## 2023-11-25 ENCOUNTER — Other Ambulatory Visit: Payer: Self-pay

## 2023-11-25 ENCOUNTER — Other Ambulatory Visit: Payer: Self-pay | Admitting: Adult Health

## 2023-11-25 DIAGNOSIS — C50912 Malignant neoplasm of unspecified site of left female breast: Secondary | ICD-10-CM

## 2023-11-25 DIAGNOSIS — K219 Gastro-esophageal reflux disease without esophagitis: Secondary | ICD-10-CM

## 2023-11-28 ENCOUNTER — Inpatient Hospital Stay: Payer: Medicare HMO

## 2023-12-03 ENCOUNTER — Encounter: Payer: Self-pay | Admitting: Hematology and Oncology

## 2023-12-05 ENCOUNTER — Inpatient Hospital Stay: Payer: Medicare HMO

## 2023-12-05 ENCOUNTER — Telehealth: Payer: Self-pay | Admitting: Hematology and Oncology

## 2023-12-05 ENCOUNTER — Inpatient Hospital Stay: Payer: Medicare HMO | Admitting: Hematology and Oncology

## 2023-12-05 ENCOUNTER — Inpatient Hospital Stay: Payer: Medicare HMO | Admitting: Adult Health

## 2023-12-05 ENCOUNTER — Inpatient Hospital Stay: Payer: Medicare HMO | Attending: Physician Assistant

## 2023-12-05 ENCOUNTER — Encounter: Payer: Self-pay | Admitting: Hematology and Oncology

## 2023-12-05 DIAGNOSIS — Z86718 Personal history of other venous thrombosis and embolism: Secondary | ICD-10-CM | POA: Insufficient documentation

## 2023-12-05 DIAGNOSIS — C773 Secondary and unspecified malignant neoplasm of axilla and upper limb lymph nodes: Secondary | ICD-10-CM | POA: Insufficient documentation

## 2023-12-05 DIAGNOSIS — Z170 Estrogen receptor positive status [ER+]: Secondary | ICD-10-CM | POA: Insufficient documentation

## 2023-12-05 DIAGNOSIS — Z1721 Progesterone receptor positive status: Secondary | ICD-10-CM | POA: Insufficient documentation

## 2023-12-05 DIAGNOSIS — Z5111 Encounter for antineoplastic chemotherapy: Secondary | ICD-10-CM | POA: Insufficient documentation

## 2023-12-05 DIAGNOSIS — Z86711 Personal history of pulmonary embolism: Secondary | ICD-10-CM | POA: Insufficient documentation

## 2023-12-05 DIAGNOSIS — C50912 Malignant neoplasm of unspecified site of left female breast: Secondary | ICD-10-CM | POA: Insufficient documentation

## 2023-12-05 DIAGNOSIS — Z1732 Human epidermal growth factor receptor 2 negative status: Secondary | ICD-10-CM | POA: Insufficient documentation

## 2023-12-05 MED ORDER — HEPARIN SOD (PORK) LOCK FLUSH 100 UNIT/ML IV SOLN: 500.0000 [IU] | Freq: Once | INTRAVENOUS | Status: AC

## 2023-12-05 MED ORDER — SODIUM CHLORIDE 0.9% FLUSH: 10.0000 mL | Freq: Once | INTRAVENOUS | Status: AC

## 2023-12-05 NOTE — Telephone Encounter (Signed)
Patient called asked to reschedule appointment for later today unable to make morning appointment. Approval from charge nurse to schedule patient to afternoon appointment.

## 2023-12-05 NOTE — Assessment & Plan Note (Deleted)
 07/12/2023: Premenopausal patient palpated a lump in the left axilla. Mammogram did not reveal any primary breast tumor. 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 07/27/2023: Bone scan: No evidence of skeletal metastasis   Treatment plan: Neoadjuvant chemotherapy with dose dense Adriamycin and Cytoxan x 4 followed by Taxol weekly x 12 Breast conserving surgery with targeted node dissection at [facility] Adjuvant radiation therapy Adjuvant antiestrogen therapy with CDK inhibitors ------------------------------------------------------------------------------------------------------------------- Current treatment: Completed 4 cycles of dose dense Adriamycin and Cytoxan, today cycle 4 Taxol Chemo toxicities: Denies peripheral neuropathy  Hypertension: Monitoring closely Return to clinic weekly for Taxol treatments every other week for follow-up with me.

## 2023-12-06 ENCOUNTER — Other Ambulatory Visit: Payer: Self-pay

## 2023-12-12 ENCOUNTER — Inpatient Hospital Stay: Payer: Medicare HMO

## 2023-12-16 ENCOUNTER — Encounter: Payer: Self-pay | Admitting: Hematology and Oncology

## 2023-12-19 ENCOUNTER — Encounter: Payer: Self-pay | Admitting: Hematology and Oncology

## 2023-12-19 ENCOUNTER — Inpatient Hospital Stay (HOSPITAL_BASED_OUTPATIENT_CLINIC_OR_DEPARTMENT_OTHER): Payer: Medicare HMO | Admitting: Adult Health

## 2023-12-19 ENCOUNTER — Encounter: Payer: Self-pay | Admitting: Adult Health

## 2023-12-19 ENCOUNTER — Inpatient Hospital Stay: Payer: Medicare HMO

## 2023-12-19 ENCOUNTER — Telehealth: Payer: Self-pay

## 2023-12-19 VITALS — BP 150/95 | HR 101 | Temp 97.5°F | Resp 20 | Ht 69.0 in | Wt >= 6400 oz

## 2023-12-19 DIAGNOSIS — C773 Secondary and unspecified malignant neoplasm of axilla and upper limb lymph nodes: Secondary | ICD-10-CM | POA: Diagnosis present

## 2023-12-19 DIAGNOSIS — C50912 Malignant neoplasm of unspecified site of left female breast: Secondary | ICD-10-CM

## 2023-12-19 DIAGNOSIS — D500 Iron deficiency anemia secondary to blood loss (chronic): Secondary | ICD-10-CM

## 2023-12-19 DIAGNOSIS — Z1721 Progesterone receptor positive status: Secondary | ICD-10-CM | POA: Diagnosis not present

## 2023-12-19 DIAGNOSIS — Z1732 Human epidermal growth factor receptor 2 negative status: Secondary | ICD-10-CM | POA: Diagnosis not present

## 2023-12-19 DIAGNOSIS — D509 Iron deficiency anemia, unspecified: Secondary | ICD-10-CM

## 2023-12-19 DIAGNOSIS — Z5111 Encounter for antineoplastic chemotherapy: Secondary | ICD-10-CM | POA: Diagnosis not present

## 2023-12-19 DIAGNOSIS — Z86718 Personal history of other venous thrombosis and embolism: Secondary | ICD-10-CM | POA: Diagnosis not present

## 2023-12-19 DIAGNOSIS — Z86711 Personal history of pulmonary embolism: Secondary | ICD-10-CM | POA: Diagnosis not present

## 2023-12-19 DIAGNOSIS — Z170 Estrogen receptor positive status [ER+]: Secondary | ICD-10-CM | POA: Diagnosis not present

## 2023-12-19 LAB — CBC WITH DIFFERENTIAL (CANCER CENTER ONLY)
Abs Immature Granulocytes: 0.01 10*3/uL (ref 0.00–0.07)
Basophils Absolute: 0 10*3/uL (ref 0.0–0.1)
Basophils Relative: 0 %
Eosinophils Absolute: 0.1 10*3/uL (ref 0.0–0.5)
Eosinophils Relative: 2 %
HCT: 32.9 % — ABNORMAL LOW (ref 36.0–46.0)
Hemoglobin: 10.6 g/dL — ABNORMAL LOW (ref 12.0–15.0)
Immature Granulocytes: 0 %
Lymphocytes Relative: 26 %
Lymphs Abs: 1.5 10*3/uL (ref 0.7–4.0)
MCH: 24.4 pg — ABNORMAL LOW (ref 26.0–34.0)
MCHC: 32.2 g/dL (ref 30.0–36.0)
MCV: 75.6 fL — ABNORMAL LOW (ref 80.0–100.0)
Monocytes Absolute: 0.4 10*3/uL (ref 0.1–1.0)
Monocytes Relative: 8 %
Neutro Abs: 3.5 10*3/uL (ref 1.7–7.7)
Neutrophils Relative %: 64 %
Platelet Count: 350 10*3/uL (ref 150–400)
RBC: 4.35 MIL/uL (ref 3.87–5.11)
RDW: 17.7 % — ABNORMAL HIGH (ref 11.5–15.5)
WBC Count: 5.5 10*3/uL (ref 4.0–10.5)
nRBC: 0 % (ref 0.0–0.2)

## 2023-12-19 LAB — CMP (CANCER CENTER ONLY)
ALT: 14 U/L (ref 0–44)
AST: 17 U/L (ref 15–41)
Albumin: 3.5 g/dL (ref 3.5–5.0)
Alkaline Phosphatase: 77 U/L (ref 38–126)
Anion gap: 7 (ref 5–15)
BUN: 14 mg/dL (ref 6–20)
CO2: 29 mmol/L (ref 22–32)
Calcium: 9.1 mg/dL (ref 8.9–10.3)
Chloride: 102 mmol/L (ref 98–111)
Creatinine: 0.91 mg/dL (ref 0.44–1.00)
GFR, Estimated: >60 mL/min (ref >60)
Glucose, Bld: 140 mg/dL — ABNORMAL HIGH (ref 70–99)
Potassium: 3.8 mmol/L (ref 3.5–5.1)
Sodium: 138 mmol/L (ref 135–145)
Total Bilirubin: 0.3 mg/dL (ref 0.0–1.2)
Total Protein: 6.7 g/dL (ref 6.5–8.1)

## 2023-12-19 LAB — PREGNANCY, URINE: Preg Test, Ur: NEGATIVE

## 2023-12-19 MED ORDER — SODIUM CHLORIDE 0.9 % IV SOLN
Freq: Once | INTRAVENOUS | Status: AC
Start: 2023-12-19 — End: 2023-12-19

## 2023-12-19 MED ORDER — HEPARIN SOD (PORK) LOCK FLUSH 100 UNIT/ML IV SOLN
500.0000 [IU] | Freq: Once | INTRAVENOUS | Status: AC | PRN
  Administered 2023-12-19: 500 [IU]

## 2023-12-19 MED ORDER — SODIUM CHLORIDE 0.9 % IV SOLN
65.0000 mg/m2 | Freq: Once | INTRAVENOUS | Status: AC
  Administered 2023-12-19: 204 mg via INTRAVENOUS
  Filled 2023-12-19: qty 34

## 2023-12-19 MED ORDER — FAMOTIDINE IN NACL 20-0.9 MG/50ML-% IV SOLN
20.0000 mg | Freq: Once | INTRAVENOUS | Status: AC
  Administered 2023-12-19: 20 mg via INTRAVENOUS
  Filled 2023-12-19: qty 50

## 2023-12-19 MED ORDER — SODIUM CHLORIDE 0.9% FLUSH
10.0000 mL | Freq: Once | INTRAVENOUS | Status: AC
  Administered 2023-12-19: 10 mL

## 2023-12-19 MED ORDER — SODIUM CHLORIDE 0.9% FLUSH
10.0000 mL | INTRAVENOUS | Status: DC | PRN
  Administered 2023-12-19: 10 mL

## 2023-12-19 MED ORDER — DEXAMETHASONE SODIUM PHOSPHATE 10 MG/ML IJ SOLN
10.0000 mg | Freq: Once | INTRAMUSCULAR | Status: AC
  Administered 2023-12-19: 10 mg via INTRAVENOUS
  Filled 2023-12-19: qty 1

## 2023-12-19 MED ORDER — DIPHENHYDRAMINE HCL 50 MG/ML IJ SOLN
25.0000 mg | Freq: Once | INTRAMUSCULAR | Status: AC
  Administered 2023-12-19: 25 mg via INTRAVENOUS
  Filled 2023-12-19: qty 1

## 2023-12-19 NOTE — Telephone Encounter (Signed)
 Enter in error

## 2023-12-19 NOTE — Progress Notes (Signed)
Late entry:  On 12/05/23, pt was a no show for port flush with lab, MD visit and infusion. When called, pt asked if she was able to come later in the day. Pt was scheduled for 2 PM visit and 230 infusion but did not arrive until 3 PM, at which time no provider or infusion could accommodate her d/t other pt's being scheduled. Pt was advised of this at [facility] and she verbalized agreement.

## 2023-12-19 NOTE — Assessment & Plan Note (Signed)
07/12/2023: Premenopausal patient palpated a lump in the left axilla. Mammogram did not reveal any primary breast tumor. 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 07/27/2023: Bone scan: No evidence of skeletal metastasis   Treatment plan: Neoadjuvant chemotherapy with dose dense Adriamycin and Cytoxan x 4 followed by Taxol weekly x 12 Breast conserving surgery with targeted node dissection at [facility] Adjuvant radiation therapy Adjuvant antiestrogen therapy with CDK inhibitors ------------------------------------------------------------------------------------------------------------------- Current treatment: Completed 4 cycles of dose dense Adriamycin and Cytoxan, today cycle 3 Taxol Chemo toxicities:  Breast Cancer on Taxol Tolerating treatment moderately well.  Missed two cycles due to diarrhea, weakness, and scheduling change. Intermittent peripheral neuropathy.  -Reduce Taxol dosage to 65 mg/m2.   -Labs stable to proceed with dose reduced treatment.   -Recommended compliance with future treatment appointments.  Back Pain Managed by Dr. Malen Gauze at [facility].  Will obtain records to understand if any further imaging evaluation is due at this time for her worsening pain. -Consider spine MRI -Will weigh in on medication options once we have full records.    RTC in 1 week for labs, f/u with Dr. Pamelia Hoit, and her next treatment.  The above was reviewed with Dr. Pamelia Hoit in detail who is in agreement with above plan.

## 2023-12-19 NOTE — Progress Notes (Signed)
[facility] Cancer Follow up:    [place] [address] [place] [address]   DIAGNOSIS:  Cancer Staging  No matching staging information was found for the patient.   SUMMARY OF ONCOLOGIC HISTORY: Oncology History  Breast cancer metastasized to axillary lymph node, left (HCC)  07/12/2023 Initial Diagnosis   Patient palpated a lump in the left axilla.  Mammogram did not reveal any primary breast tumor.  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+   08/15/2023 -  Chemotherapy   Patient is on Treatment Plan : BREAST ADJUVANT DOSE DENSE AC q14d / PACLitaxel q7d       CURRENT THERAPY:Taxol   INTERVAL HISTORY:  Discussed the use of AI scribe software for clinical note transcription with the patient, who gave verbal consent to proceed.  [name] 48 y.o. female returns for follow-up and evaluation prior to her next cycle of neoadjuvant chemotherapy with Taxol.  She has missed the past 2 weeks of treatments.  Once due to scheduling changes, and the other due to feeling unwell and experiencing diarrhea and weakness.  She notes that she has intermittent neuropathy in the pads of her fingertips and toes that comes and goes and is worse following her chemotherapy treatments.    She also has struggled with her chronic back pain that has worsened over the past couple of months.  She is under treatment with Dr. Malen Gauze at [facility] clinic and takes oxycodone and gabapentin for the pain, however feels these are not as effective as prior.  She brought her worsening pain concerns up to her pain doctor who recommended she f/u with our office since she is undergoing cancer treatment.  I cannot find notes or recent imaging regarding her pain treatment in care everywhere or scanned into her medical record.     Patient Active Problem List   Diagnosis Date Noted   Breast cancer metastasized to axillary lymph node, left (HCC)  07/19/2023   Menorrhagia 03/01/2018   Fibroids 03/01/2018   Obesity, morbid (HCC) 12/24/2013   Obesity hypoventilation syndrome (HCC) 12/24/2013   Iron deficiency anemia 09/29/2012   Pulmonary embolism (HCC) 09/26/2012   SOB (shortness of breath) 09/25/2012   Anemia 09/25/2012   PALPITATIONS 03/10/2010   DEPRESSION 12/09/2009   MENORRHAGIA 12/09/2009   Disorder of bursae and tendons in shoulder region 12/09/2009   PANIC DISORDER 07/17/2009   LUMBAR RADICULOPATHY, LEFT 07/17/2009   SLEEP APNEA, OBSTRUCTIVE 11/05/2008   PERIPHERAL EDEMA 11/05/2008   URINALYSIS, ABNORMAL 11/05/2008   Morbid obesity (HCC) 06/09/2007   HYPERTENSION, BENIGN ESSENTIAL 06/09/2007   Allergic rhinitis 06/09/2007   Asthma 06/09/2007   URTICARIA 06/09/2007   ANXIETY DISORDER, HX OF 06/09/2007   DEPRESSION, HX OF 06/09/2007   HYPERGLYCEMIA 06/02/2007   CYST, VULVA 07/25/2006   ANEMIA, IRON DEFICIENCY 12/28/2005   ECHOCARDIOGRAM, ABNORMAL 01/25/2005   Personal history of venous thrombosis and embolism 01/24/2005    is allergic to doxycycline, fish allergy, fish oil, other, pollen extract, citalopram, amoxicillin, penicillins, and strawberry extract.  MEDICAL HISTORY: Past Medical History:  Diagnosis Date   Allergic rhinitis    Anemia    Anxiety disorder    Asthma    Benign essential hypertension    Depression    Diabetes mellitus without complication (HCC)    Edema    History of blood clots    Hyperglycemia    Lumbar radiculopathy    Metrorrhagia  Morbid obesity (HCC)    OSA (obstructive sleep apnea)    Sickle cell trait (HCC)    Snoring     SURGICAL HISTORY: Past Surgical History:  Procedure Laterality Date   CESAREAN SECTION     CHOLECYSTECTOMY     IR IMAGING GUIDED PORT INSERTION  08/08/2023   IR US SOFT TISSUE NECK  09/21/2023   TUBAL LIGATION      SOCIAL HISTORY: Social History   Socioeconomic History   Marital status: Single    Spouse name: Not on file   Number of  children: 3   Years of education: 16   Highest education level: Not on file  Occupational History   Not on file  Tobacco Use   Smoking status: Never   Smokeless tobacco: Never  Vaping Use   Vaping status: Never Used  Substance and Sexual Activity   Alcohol use: Not Currently   Drug use: No   Sexual activity: Yes    Partners: Male    Birth control/protection: Surgical  Other Topics Concern   Not on file  Social History Narrative   Patient is single and lives with her children.   Patient has three children.   Patient is currently unemployed.   Patient is a Holiday representative in college now.   Patient is right-handed.   Patient drinks one cup of tea every other day.   Social Drivers of Corporate investment banker Strain: Not on file  Food Insecurity: Not on file  Transportation Needs: Not on file  Physical Activity: Not on file  Stress: Not on file  Social Connections: Unknown (06/07/2023)   Received from [facility]   Social Network    Social Network: Not on file  Intimate Partner Violence: Unknown (06/07/2023)   Received from [facility]   HITS    Physically Hurt: Not on file    Insult or Talk Down To: Not on file    Threaten Physical Harm: Not on file    Scream or Curse: Not on file    FAMILY HISTORY: Family History  Problem Relation Age of Onset   Asthma Mother    Hypertension Mother    Leukemia Father    Diabetes Father    Breast cancer Maternal Aunt    Breast cancer Paternal Aunt    Breast cancer Cousin    Diabetes Brother     Review of Systems - Oncology    PHYSICAL EXAMINATION    Vitals:   12/19/23 1043  BP: (!) 150/95  Pulse: (!) 101  Resp: 20  Temp: (!) 97.5 F (36.4 C)  SpO2: 95%    Physical Exam  LABORATORY DATA:  CBC    Component Value Date/Time   WBC 5.5 12/19/2023 1015   WBC 8.5 09/21/2023 0022   RBC 4.35 12/19/2023 1015   HGB 10.6 (L) 12/19/2023 1015   HCT 32.9 (L) 12/19/2023 1015   PLT 350 12/19/2023 1015   MCV 75.6 (L)  12/19/2023 1015   MCH 24.4 (L) 12/19/2023 1015   MCHC 32.2 12/19/2023 1015   RDW 17.7 (H) 12/19/2023 1015   LYMPHSABS 1.5 12/19/2023 1015   MONOABS 0.4 12/19/2023 1015   EOSABS 0.1 12/19/2023 1015   BASOSABS 0.0 12/19/2023 1015    CMP     Component Value Date/Time   NA 138 12/19/2023 1015   K 3.8 12/19/2023 1015   CL 102 12/19/2023 1015   CO2 29 12/19/2023 1015   GLUCOSE 140 (H) 12/19/2023 1015   BUN 14  12/19/2023 1015   CREATININE 0.91 12/19/2023 1015   CALCIUM 9.1 12/19/2023 1015   PROT 6.7 12/19/2023 1015   ALBUMIN 3.5 12/19/2023 1015   AST 17 12/19/2023 1015   ALT 14 12/19/2023 1015   ALKPHOS 77 12/19/2023 1015   BILITOT 0.3 12/19/2023 1015   GFRNONAA >60 12/19/2023 1015   GFRAA >60 06/29/2020 2341      ASSESSMENT and THERAPY PLAN:   Breast cancer metastasized to axillary lymph node, left (HCC) 07/12/2023: Premenopausal patient palpated a lump in the left axilla. Mammogram did not reveal any primary breast tumor. 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 07/27/2023: Bone scan: No evidence of skeletal metastasis   Treatment plan: Neoadjuvant chemotherapy with dose dense Adriamycin and Cytoxan x 4 followed by Taxol weekly x 12 Breast conserving surgery with targeted node dissection at [facility] Adjuvant radiation therapy Adjuvant antiestrogen therapy with CDK inhibitors ------------------------------------------------------------------------------------------------------------------- Current treatment: Completed 4 cycles of dose dense Adriamycin and Cytoxan, today cycle 3 Taxol Chemo toxicities:  Breast Cancer on Taxol Tolerating treatment moderately well.  Missed two cycles due to diarrhea, weakness, and scheduling change. Intermittent peripheral neuropathy.  -Reduce Taxol dosage to 65 mg/m2.   -Labs  stable to proceed with dose reduced treatment.   -Recommended compliance with future treatment appointments.  Back Pain Managed by Dr. Malen Gauze at [facility].  Will obtain records to understand if any further imaging evaluation is due at this time for her worsening pain. -Consider spine MRI -Will weigh in on medication options once we have full records.    RTC in 1 week for labs, f/u with Dr. Pamelia Hoit, and her next treatment.  The above was reviewed with Dr. Pamelia Hoit in detail who is in agreement with above plan.    All questions were answered. The patient knows to call the clinic with any problems, questions or concerns. We can certainly see the patient much sooner if necessary.  Total encounter time:30 minutes*in face-to-face visit time, chart review, lab review, care coordination, order entry, and documentation of the encounter time.  Lillard Anes, NP 12/19/23 1:11 PM Medical Oncology and Hematology [facility] [address] [place], [place] [address] Tel. [phone]    Fax. [phone]  *Total Encounter Time as defined by the Centers for Medicare and Medicaid Services includes, in addition to the face-to-face time of a patient visit (documented in the note above) non-face-to-face time: obtaining and reviewing outside history, ordering and reviewing medications, tests or procedures, care coordination (communications with other health care professionals or caregivers) and documentation in the medical record.

## 2023-12-20 ENCOUNTER — Other Ambulatory Visit: Payer: Self-pay | Admitting: Hematology and Oncology

## 2023-12-20 ENCOUNTER — Telehealth: Payer: Self-pay | Admitting: Hematology and Oncology

## 2023-12-20 ENCOUNTER — Other Ambulatory Visit (HOSPITAL_COMMUNITY): Payer: Self-pay

## 2023-12-20 NOTE — Telephone Encounter (Signed)
 Scheduled appointments per WQ. Patient is aware of the made appointments.

## 2023-12-21 ENCOUNTER — Other Ambulatory Visit: Payer: Self-pay

## 2023-12-22 ENCOUNTER — Ambulatory Visit: Payer: Medicare HMO | Admitting: Podiatry

## 2023-12-23 ENCOUNTER — Encounter: Payer: Self-pay | Admitting: *Deleted

## 2023-12-26 ENCOUNTER — Inpatient Hospital Stay: Payer: Medicare HMO

## 2023-12-26 ENCOUNTER — Telehealth: Payer: Self-pay

## 2023-12-26 ENCOUNTER — Ambulatory Visit: Payer: Medicare HMO | Admitting: Hematology and Oncology

## 2023-12-26 ENCOUNTER — Other Ambulatory Visit: Payer: Self-pay

## 2023-12-26 ENCOUNTER — Inpatient Hospital Stay: Payer: Medicare HMO | Admitting: Hematology and Oncology

## 2023-12-26 ENCOUNTER — Other Ambulatory Visit: Payer: Self-pay | Admitting: Hematology and Oncology

## 2023-12-26 ENCOUNTER — Telehealth: Payer: Self-pay | Admitting: Hematology and Oncology

## 2023-12-26 VITALS — BP 115/92 | HR 101 | Temp 98.9°F | Resp 18 | Wt 393.0 lb

## 2023-12-26 DIAGNOSIS — D509 Iron deficiency anemia, unspecified: Secondary | ICD-10-CM

## 2023-12-26 DIAGNOSIS — D500 Iron deficiency anemia secondary to blood loss (chronic): Secondary | ICD-10-CM

## 2023-12-26 DIAGNOSIS — C50912 Malignant neoplasm of unspecified site of left female breast: Secondary | ICD-10-CM

## 2023-12-26 DIAGNOSIS — Z5111 Encounter for antineoplastic chemotherapy: Secondary | ICD-10-CM | POA: Diagnosis not present

## 2023-12-26 LAB — CMP (CANCER CENTER ONLY)
ALT: 13 U/L (ref 0–44)
AST: 14 U/L — ABNORMAL LOW (ref 15–41)
Albumin: 3.6 g/dL (ref 3.5–5.0)
Alkaline Phosphatase: 98 U/L (ref 38–126)
Anion gap: 6 (ref 5–15)
BUN: 11 mg/dL (ref 6–20)
CO2: 27 mmol/L (ref 22–32)
Calcium: 9.3 mg/dL (ref 8.9–10.3)
Chloride: 104 mmol/L (ref 98–111)
Creatinine: 0.85 mg/dL (ref 0.44–1.00)
GFR, Estimated: >60 mL/min (ref >60)
Glucose, Bld: 205 mg/dL — ABNORMAL HIGH (ref 70–99)
Potassium: 4.3 mmol/L (ref 3.5–5.1)
Sodium: 137 mmol/L (ref 135–145)
Total Bilirubin: 0.3 mg/dL (ref 0.0–1.2)
Total Protein: 7.3 g/dL (ref 6.5–8.1)

## 2023-12-26 LAB — CBC WITH DIFFERENTIAL (CANCER CENTER ONLY)
Abs Immature Granulocytes: 0.02 10*3/uL (ref 0.00–0.07)
Basophils Absolute: 0 10*3/uL (ref 0.0–0.1)
Basophils Relative: 0 %
Eosinophils Absolute: 0.1 10*3/uL (ref 0.0–0.5)
Eosinophils Relative: 1 %
HCT: 36.2 % (ref 36.0–46.0)
Hemoglobin: 11.2 g/dL — ABNORMAL LOW (ref 12.0–15.0)
Immature Granulocytes: 0 %
Lymphocytes Relative: 27 %
Lymphs Abs: 1.3 10*3/uL (ref 0.7–4.0)
MCH: 23.3 pg — ABNORMAL LOW (ref 26.0–34.0)
MCHC: 30.9 g/dL (ref 30.0–36.0)
MCV: 75.4 fL — ABNORMAL LOW (ref 80.0–100.0)
Monocytes Absolute: 0.2 10*3/uL (ref 0.1–1.0)
Monocytes Relative: 5 %
Neutro Abs: 3.1 10*3/uL (ref 1.7–7.7)
Neutrophils Relative %: 67 %
Platelet Count: 505 10*3/uL — ABNORMAL HIGH (ref 150–400)
RBC: 4.8 MIL/uL (ref 3.87–5.11)
RDW: 17.8 % — ABNORMAL HIGH (ref 11.5–15.5)
WBC Count: 4.7 10*3/uL (ref 4.0–10.5)
nRBC: 0 % (ref 0.0–0.2)

## 2023-12-26 LAB — PREGNANCY, URINE: Preg Test, Ur: NEGATIVE

## 2023-12-26 MED ORDER — DIPHENHYDRAMINE HCL 50 MG/ML IJ SOLN
25.0000 mg | Freq: Once | INTRAMUSCULAR | Status: AC
  Administered 2023-12-26: 25 mg via INTRAVENOUS
  Filled 2023-12-26: qty 1

## 2023-12-26 MED ORDER — SODIUM CHLORIDE 0.9% FLUSH
10.0000 mL | Freq: Once | INTRAVENOUS | Status: AC
Start: 2023-12-26 — End: 2023-12-26
  Administered 2023-12-26: 10 mL

## 2023-12-26 MED ORDER — DEXAMETHASONE SODIUM PHOSPHATE 10 MG/ML IJ SOLN
10.0000 mg | Freq: Once | INTRAMUSCULAR | Status: AC
  Administered 2023-12-26: 10 mg via INTRAVENOUS
  Filled 2023-12-26: qty 1

## 2023-12-26 MED ORDER — HEPARIN SOD (PORK) LOCK FLUSH 100 UNIT/ML IV SOLN
500.0000 [IU] | Freq: Once | INTRAVENOUS | Status: AC | PRN
  Administered 2023-12-26: 500 [IU]

## 2023-12-26 MED ORDER — PALONOSETRON HCL INJECTION 0.25 MG/5ML
0.2500 mg | Freq: Once | INTRAVENOUS | Status: AC
  Administered 2023-12-26: 0.25 mg via INTRAVENOUS
  Filled 2023-12-26: qty 5

## 2023-12-26 MED ORDER — PACLITAXEL CHEMO INJECTION 300 MG/50ML
65.0000 mg/m2 | Freq: Once | INTRAVENOUS | Status: AC
  Administered 2023-12-26: 204 mg via INTRAVENOUS
  Filled 2023-12-26: qty 34

## 2023-12-26 MED ORDER — FAMOTIDINE IN NACL 20-0.9 MG/50ML-% IV SOLN
20.0000 mg | Freq: Once | INTRAVENOUS | Status: AC
  Administered 2023-12-26: 20 mg via INTRAVENOUS
  Filled 2023-12-26: qty 50

## 2023-12-26 MED ORDER — SODIUM CHLORIDE 0.9 % IV SOLN: Freq: Once | INTRAVENOUS | Status: AC

## 2023-12-26 MED ORDER — SODIUM CHLORIDE 0.9% FLUSH
10.0000 mL | INTRAVENOUS | Status: DC | PRN
  Administered 2023-12-26: 10 mL

## 2023-12-26 NOTE — Telephone Encounter (Signed)
 Called to update the patient that her appointments for today 24 February can be moved to the afternoon. Left VM for the patient to call back so the appointments can be confirmed. Appointments have not been moved at this time.

## 2023-12-26 NOTE — Telephone Encounter (Signed)
 Called pt about missed appt. With Dr.Gudena at 10:30. Pt states that she has asked for evening appts because she doesn't do well with mornings. I informed provider and he states that if pt comes in this evening that she does not have to see him, that it is ok to get her labs and go straight to get her treatment. I called pt back twice and did not receive an answer. I was notified by charge nurse Nelda Bucks that pt was on the phone with scheduling to to cancel and schedule new appt.

## 2023-12-26 NOTE — Telephone Encounter (Signed)
 Attempted to call pt and LVM advising to call office back to further discuss appts and what would work best for her to remain in compliance with her health.

## 2023-12-26 NOTE — Assessment & Plan Note (Deleted)
 07/12/2023: Premenopausal patient palpated a lump in the left axilla. Mammogram did not reveal any primary breast tumor. 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 07/27/2023: Bone scan: No evidence of skeletal metastasis   Treatment plan: Neoadjuvant chemotherapy with dose dense Adriamycin and Cytoxan x 4 followed by Taxol weekly x 12 Breast conserving surgery with targeted node dissection at [facility] Adjuvant radiation therapy Adjuvant antiestrogen therapy with CDK inhibitors ------------------------------------------------------------------------------------------------------------------- Current treatment: Completed 4 cycles of dose dense Adriamycin and Cytoxan, cycle 5 Taxol Chemo toxicities: Nailbed discoloration Chemo-induced anemia: Monitoring closely Thrombosis involving the port Alopecia   Return to clinic weekly for chemo and every other week for follow-up with me

## 2023-12-26 NOTE — Progress Notes (Signed)
 Patient is complaining of nausea vomiting and weight loss: I added Aloxi to her treatment.

## 2023-12-28 ENCOUNTER — Telehealth: Payer: Self-pay | Admitting: Hematology and Oncology

## 2023-12-28 ENCOUNTER — Other Ambulatory Visit: Payer: Self-pay

## 2023-12-28 NOTE — Telephone Encounter (Signed)
 Scheduled appointments per WQ. Patient is aware of the made appointments.

## 2023-12-29 ENCOUNTER — Other Ambulatory Visit: Payer: Self-pay | Admitting: *Deleted

## 2023-12-29 ENCOUNTER — Other Ambulatory Visit: Payer: Self-pay

## 2023-12-29 DIAGNOSIS — C50912 Malignant neoplasm of unspecified site of left female breast: Secondary | ICD-10-CM

## 2024-01-02 ENCOUNTER — Inpatient Hospital Stay (HOSPITAL_BASED_OUTPATIENT_CLINIC_OR_DEPARTMENT_OTHER): Payer: Medicare HMO | Admitting: Adult Health

## 2024-01-02 ENCOUNTER — Inpatient Hospital Stay: Payer: Medicare HMO

## 2024-01-02 ENCOUNTER — Encounter: Payer: Self-pay | Admitting: Adult Health

## 2024-01-02 ENCOUNTER — Inpatient Hospital Stay: Payer: Medicare HMO | Attending: Physician Assistant

## 2024-01-02 VITALS — BP 97/75 | HR 102 | Temp 97.3°F | Ht 69.0 in | Wt 395.3 lb

## 2024-01-02 DIAGNOSIS — T451X5A Adverse effect of antineoplastic and immunosuppressive drugs, initial encounter: Secondary | ICD-10-CM | POA: Insufficient documentation

## 2024-01-02 DIAGNOSIS — D573 Sickle-cell trait: Secondary | ICD-10-CM | POA: Diagnosis not present

## 2024-01-02 DIAGNOSIS — Z86711 Personal history of pulmonary embolism: Secondary | ICD-10-CM | POA: Diagnosis not present

## 2024-01-02 DIAGNOSIS — Z803 Family history of malignant neoplasm of breast: Secondary | ICD-10-CM | POA: Diagnosis not present

## 2024-01-02 DIAGNOSIS — Z806 Family history of leukemia: Secondary | ICD-10-CM | POA: Diagnosis not present

## 2024-01-02 DIAGNOSIS — R5383 Other fatigue: Secondary | ICD-10-CM | POA: Insufficient documentation

## 2024-01-02 DIAGNOSIS — R110 Nausea: Secondary | ICD-10-CM | POA: Insufficient documentation

## 2024-01-02 DIAGNOSIS — C50912 Malignant neoplasm of unspecified site of left female breast: Secondary | ICD-10-CM | POA: Insufficient documentation

## 2024-01-02 DIAGNOSIS — G629 Polyneuropathy, unspecified: Secondary | ICD-10-CM | POA: Insufficient documentation

## 2024-01-02 DIAGNOSIS — D509 Iron deficiency anemia, unspecified: Secondary | ICD-10-CM | POA: Diagnosis not present

## 2024-01-02 DIAGNOSIS — G4733 Obstructive sleep apnea (adult) (pediatric): Secondary | ICD-10-CM | POA: Diagnosis not present

## 2024-01-02 DIAGNOSIS — Z7984 Long term (current) use of oral hypoglycemic drugs: Secondary | ICD-10-CM | POA: Diagnosis not present

## 2024-01-02 DIAGNOSIS — E1142 Type 2 diabetes mellitus with diabetic polyneuropathy: Secondary | ICD-10-CM | POA: Diagnosis not present

## 2024-01-02 DIAGNOSIS — R000 Tachycardia, unspecified: Secondary | ICD-10-CM | POA: Insufficient documentation

## 2024-01-02 DIAGNOSIS — C773 Secondary and unspecified malignant neoplasm of axilla and upper limb lymph nodes: Secondary | ICD-10-CM | POA: Insufficient documentation

## 2024-01-02 DIAGNOSIS — Z7901 Long term (current) use of anticoagulants: Secondary | ICD-10-CM | POA: Insufficient documentation

## 2024-01-02 DIAGNOSIS — I10 Essential (primary) hypertension: Secondary | ICD-10-CM | POA: Insufficient documentation

## 2024-01-02 DIAGNOSIS — N390 Urinary tract infection, site not specified: Secondary | ICD-10-CM | POA: Insufficient documentation

## 2024-01-02 DIAGNOSIS — Z5111 Encounter for antineoplastic chemotherapy: Secondary | ICD-10-CM | POA: Diagnosis present

## 2024-01-02 DIAGNOSIS — D6481 Anemia due to antineoplastic chemotherapy: Secondary | ICD-10-CM | POA: Diagnosis not present

## 2024-01-02 DIAGNOSIS — D500 Iron deficiency anemia secondary to blood loss (chronic): Secondary | ICD-10-CM

## 2024-01-02 DIAGNOSIS — J45909 Unspecified asthma, uncomplicated: Secondary | ICD-10-CM | POA: Insufficient documentation

## 2024-01-02 LAB — CBC WITH DIFFERENTIAL (CANCER CENTER ONLY)
Abs Immature Granulocytes: 0.01 10*3/uL (ref 0.00–0.07)
Basophils Absolute: 0 10*3/uL (ref 0.0–0.1)
Basophils Relative: 1 %
Eosinophils Absolute: 0.1 10*3/uL (ref 0.0–0.5)
Eosinophils Relative: 2 %
HCT: 33.5 % — ABNORMAL LOW (ref 36.0–46.0)
Hemoglobin: 10.8 g/dL — ABNORMAL LOW (ref 12.0–15.0)
Immature Granulocytes: 0 %
Lymphocytes Relative: 34 %
Lymphs Abs: 1.3 10*3/uL (ref 0.7–4.0)
MCH: 23.7 pg — ABNORMAL LOW (ref 26.0–34.0)
MCHC: 32.2 g/dL (ref 30.0–36.0)
MCV: 73.6 fL — ABNORMAL LOW (ref 80.0–100.0)
Monocytes Absolute: 0.2 10*3/uL (ref 0.1–1.0)
Monocytes Relative: 5 %
Neutro Abs: 2.1 10*3/uL (ref 1.7–7.7)
Neutrophils Relative %: 58 %
Platelet Count: 445 10*3/uL — ABNORMAL HIGH (ref 150–400)
RBC: 4.55 MIL/uL (ref 3.87–5.11)
RDW: 17.7 % — ABNORMAL HIGH (ref 11.5–15.5)
WBC Count: 3.7 10*3/uL — ABNORMAL LOW (ref 4.0–10.5)
nRBC: 0 % (ref 0.0–0.2)

## 2024-01-02 LAB — CMP (CANCER CENTER ONLY)
ALT: 16 U/L (ref 0–44)
AST: 22 U/L (ref 15–41)
Albumin: 3.5 g/dL (ref 3.5–5.0)
Alkaline Phosphatase: 108 U/L (ref 38–126)
Anion gap: 7 (ref 5–15)
BUN: 10 mg/dL (ref 6–20)
CO2: 26 mmol/L (ref 22–32)
Calcium: 9 mg/dL (ref 8.9–10.3)
Chloride: 102 mmol/L (ref 98–111)
Creatinine: 0.88 mg/dL (ref 0.44–1.00)
GFR, Estimated: >60 mL/min (ref >60)
Glucose, Bld: 170 mg/dL — ABNORMAL HIGH (ref 70–99)
Potassium: 3.8 mmol/L (ref 3.5–5.1)
Sodium: 135 mmol/L (ref 135–145)
Total Bilirubin: 0.4 mg/dL (ref 0.0–1.2)
Total Protein: 6.9 g/dL (ref 6.5–8.1)

## 2024-01-02 MED ORDER — SODIUM CHLORIDE 0.9% FLUSH
10.0000 mL | Freq: Once | INTRAVENOUS | Status: AC
  Administered 2024-01-02: 10 mL

## 2024-01-02 MED ORDER — PALONOSETRON HCL INJECTION 0.25 MG/5ML
0.2500 mg | Freq: Once | INTRAVENOUS | Status: AC
  Administered 2024-01-02: 0.25 mg via INTRAVENOUS
  Filled 2024-01-02: qty 5

## 2024-01-02 MED ORDER — SODIUM CHLORIDE 0.9 % IV SOLN
65.0000 mg/m2 | Freq: Once | INTRAVENOUS | Status: AC
  Administered 2024-01-02: 204 mg via INTRAVENOUS
  Filled 2024-01-02: qty 34

## 2024-01-02 MED ORDER — DIPHENHYDRAMINE HCL 50 MG/ML IJ SOLN
25.0000 mg | Freq: Once | INTRAMUSCULAR | Status: AC
  Administered 2024-01-02: 25 mg via INTRAVENOUS
  Filled 2024-01-02: qty 1

## 2024-01-02 MED ORDER — SODIUM CHLORIDE 0.9 % IV SOLN: Freq: Once | INTRAVENOUS | Status: AC

## 2024-01-02 MED ORDER — FAMOTIDINE IN NACL 20-0.9 MG/50ML-% IV SOLN
20.0000 mg | Freq: Once | INTRAVENOUS | Status: AC
  Administered 2024-01-02: 20 mg via INTRAVENOUS
  Filled 2024-01-02: qty 50

## 2024-01-02 MED ORDER — DEXAMETHASONE SODIUM PHOSPHATE 10 MG/ML IJ SOLN
10.0000 mg | Freq: Once | INTRAMUSCULAR | Status: AC
  Administered 2024-01-02: 10 mg via INTRAVENOUS
  Filled 2024-01-02: qty 1

## 2024-01-02 NOTE — Progress Notes (Signed)
 [facility] Cancer Follow up:    [place] [address] [name] [address]   DIAGNOSIS:  Cancer Staging  No matching staging information was found for the patient.   SUMMARY OF ONCOLOGIC HISTORY: Oncology History  Breast cancer metastasized to axillary lymph node, left (HCC)  07/12/2023 Initial Diagnosis   Patient palpated a lump in the left axilla.  Mammogram did not reveal any primary breast tumor.  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+   08/15/2023 -  Chemotherapy   Patient is on Treatment Plan : BREAST ADJUVANT DOSE DENSE AC q14d / PACLitaxel q7d       CURRENT THERAPY: taxol  INTERVAL HISTORY: [name] 48 y.o. female returns for before receiving her neck cycle of chemotherapy.  She is receiving this at a dose reduction due to an intermittent peripheral neuropathy she is experiencing.  She feels a little bit queasy today and tired but otherwise is feeling well.   Patient Active Problem List   Diagnosis Date Noted   Breast cancer metastasized to axillary lymph node, left (HCC) 07/19/2023   Menorrhagia 03/01/2018   Fibroids 03/01/2018   Obesity, morbid (HCC) 12/24/2013   Obesity hypoventilation syndrome (HCC) 12/24/2013   Iron deficiency anemia 09/29/2012   Pulmonary embolism (HCC) 09/26/2012   SOB (shortness of breath) 09/25/2012   Anemia 09/25/2012   PALPITATIONS 03/10/2010   DEPRESSION 12/09/2009   MENORRHAGIA 12/09/2009   Disorder of bursae and tendons in shoulder region 12/09/2009   PANIC DISORDER 07/17/2009   LUMBAR RADICULOPATHY, LEFT 07/17/2009   SLEEP APNEA, OBSTRUCTIVE 11/05/2008   PERIPHERAL EDEMA 11/05/2008   URINALYSIS, ABNORMAL 11/05/2008   Morbid obesity (HCC) 06/09/2007   HYPERTENSION, BENIGN ESSENTIAL 06/09/2007   Allergic rhinitis 06/09/2007   Asthma 06/09/2007   URTICARIA 06/09/2007   ANXIETY DISORDER, HX OF 06/09/2007   DEPRESSION, HX OF 06/09/2007    HYPERGLYCEMIA 06/02/2007   CYST, VULVA 07/25/2006   ANEMIA, IRON DEFICIENCY 12/28/2005   ECHOCARDIOGRAM, ABNORMAL 01/25/2005   Personal history of venous thrombosis and embolism 01/24/2005    is allergic to doxycycline, fish allergy, fish oil, other, pollen extract, citalopram, amoxicillin, penicillins, and strawberry extract.  MEDICAL HISTORY: Past Medical History:  Diagnosis Date   Allergic rhinitis    Anemia    Anxiety disorder    Asthma    Benign essential hypertension    Depression    Diabetes mellitus without complication (HCC)    Edema    History of blood clots    Hyperglycemia    Lumbar radiculopathy    Metrorrhagia    Morbid obesity (HCC)    OSA (obstructive sleep apnea)    Sickle cell trait (HCC)    Snoring     SURGICAL HISTORY: Past Surgical History:  Procedure Laterality Date   CESAREAN SECTION     CHOLECYSTECTOMY     IR IMAGING GUIDED PORT INSERTION  08/08/2023   IR US SOFT TISSUE NECK  09/21/2023   TUBAL LIGATION      SOCIAL HISTORY: Social History   Socioeconomic History   Marital status: Single    Spouse name: Not on file   Number of children: 3   Years of education: 16   Highest education level: Not on file  Occupational History   Not on file  Tobacco Use   Smoking status: Never   Smokeless tobacco: Never  Vaping Use   Vaping status: Never Used  Substance and  Sexual Activity   Alcohol use: Not Currently   Drug use: No   Sexual activity: Yes    Partners: Male    Birth control/protection: Surgical  Other Topics Concern   Not on file  Social History Narrative   Patient is single and lives with her children.   Patient has three children.   Patient is currently unemployed.   Patient is a Holiday representative in college now.   Patient is right-handed.   Patient drinks one cup of tea every other day.   Social Drivers of Corporate investment banker Strain: Not on file  Food Insecurity: Not on file  Transportation Needs: Not on file  Physical  Activity: Not on file  Stress: Not on file  Social Connections: Unknown (06/07/2023)   Received from [facility]   Social Network    Social Network: Not on file  Intimate Partner Violence: Unknown (06/07/2023)   Received from [facility]   HITS    Physically Hurt: Not on file    Insult or Talk Down To: Not on file    Threaten Physical Harm: Not on file    Scream or Curse: Not on file    FAMILY HISTORY: Family History  Problem Relation Age of Onset   Asthma Mother    Hypertension Mother    Leukemia Father    Diabetes Father    Breast cancer Maternal Aunt    Breast cancer Paternal Aunt    Breast cancer Cousin    Diabetes Brother     Review of Systems  Constitutional:  Negative for appetite change, chills, fatigue, fever and unexpected weight change.  HENT:   Negative for hearing loss, lump/mass and trouble swallowing.   Eyes:  Negative for eye problems and icterus.  Respiratory:  Negative for chest tightness, cough and shortness of breath.   Cardiovascular:  Negative for chest pain, leg swelling and palpitations.  Gastrointestinal:  Negative for abdominal distention, abdominal pain, constipation, diarrhea, nausea and vomiting.  Endocrine: Negative for hot flashes.  Genitourinary:  Negative for difficulty urinating.   Musculoskeletal:  Negative for arthralgias.  Skin:  Negative for itching and rash.  Neurological:  Negative for dizziness, extremity weakness, headaches and numbness.  Hematological:  Negative for adenopathy. Does not bruise/bleed easily.  Psychiatric/Behavioral:  Negative for depression. The patient is not nervous/anxious.       PHYSICAL EXAMINATION    Vitals:   01/02/24 1420  BP: 97/75  Pulse: (!) 102  Temp: (!) 97.3 F (36.3 C)  SpO2: 95%    Physical Exam Constitutional:      General: She is not in acute distress.    Appearance: Normal appearance. She is not toxic-appearing.  HENT:     Head: Normocephalic and atraumatic.     Mouth/Throat:      Mouth: Mucous membranes are moist.     Pharynx: Oropharynx is clear. No oropharyngeal exudate or posterior oropharyngeal erythema.  Eyes:     General: No scleral icterus. Cardiovascular:     Rate and Rhythm: Normal rate and regular rhythm.     Pulses: Normal pulses.     Heart sounds: Normal heart sounds.  Pulmonary:     Effort: Pulmonary effort is normal.     Breath sounds: Normal breath sounds.  Abdominal:     General: Abdomen is flat. Bowel sounds are normal. There is no distension.     Palpations: Abdomen is soft.     Tenderness: There is no abdominal tenderness.  Musculoskeletal:  General: No swelling.     Cervical back: Neck supple.  Lymphadenopathy:     Cervical: No cervical adenopathy.  Skin:    General: Skin is warm and dry.     Findings: No rash.  Neurological:     General: No focal deficit present.     Mental Status: She is alert.  Psychiatric:        Mood and Affect: Mood normal.        Behavior: Behavior normal.     LABORATORY DATA:  CBC    Component Value Date/Time   WBC 3.7 (L) 01/02/2024 1336   WBC 8.5 09/21/2023 0022   RBC 4.55 01/02/2024 1336   HGB 10.8 (L) 01/02/2024 1336   HCT 33.5 (L) 01/02/2024 1336   PLT 445 (H) 01/02/2024 1336   MCV 73.6 (L) 01/02/2024 1336   MCH 23.7 (L) 01/02/2024 1336   MCHC 32.2 01/02/2024 1336   RDW 17.7 (H) 01/02/2024 1336   LYMPHSABS 1.3 01/02/2024 1336   MONOABS 0.2 01/02/2024 1336   EOSABS 0.1 01/02/2024 1336   BASOSABS 0.0 01/02/2024 1336    CMP     Component Value Date/Time   NA 135 01/02/2024 1336   K 3.8 01/02/2024 1336   CL 102 01/02/2024 1336   CO2 26 01/02/2024 1336   GLUCOSE 170 (H) 01/02/2024 1336   BUN 10 01/02/2024 1336   CREATININE 0.88 01/02/2024 1336   CALCIUM 9.0 01/02/2024 1336   PROT 6.9 01/02/2024 1336   ALBUMIN 3.5 01/02/2024 1336   AST 22 01/02/2024 1336   ALT 16 01/02/2024 1336   ALKPHOS 108 01/02/2024 1336   BILITOT 0.4 01/02/2024 1336   GFRNONAA >60 01/02/2024 1336    GFRAA >60 06/29/2020 2341         ASSESSMENT and THERAPY PLAN:   Breast cancer metastasized to axillary lymph node, left (HCC) 07/12/2023: Premenopausal patient palpated a lump in the left axilla. Mammogram did not reveal any primary breast tumor. 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 07/27/2023: Bone scan: No evidence of skeletal metastasis   Treatment plan: Neoadjuvant chemotherapy with dose dense Adriamycin and Cytoxan x 4 followed by Taxol weekly x 12 Breast conserving surgery with targeted node dissection at [facility] Adjuvant radiation therapy Adjuvant antiestrogen therapy with CDK inhibitors ------------------------------------------------------------------------------------------------------------------- Current treatment: Completed 4 cycles of dose dense Adriamycin and Cytoxan, today cycle 6 Taxol Chemo toxicities:  Breast Cancer on Taxol Tolerating treatment moderately well.  Persistent and unchanged intermittent peripheral neuropathy.  She can hardly feel the area where her cancer was at this point indicating + response to treatment.  -Reduce Taxol dosage to 65 mg/m2.   -Labs stable to proceed with dose reduced treatment.    Fatigue and nausea: discussed energy conservation and nausea medication recommendations.    RTC in 1 week for labs, f/u, and Taxol    All questions were answered. The patient knows to call the clinic with any problems, questions or concerns. We can certainly see the patient much sooner if necessary.  Total encounter time:20 minutes*in face-to-face visit time, chart review, lab review, care coordination, order entry, and documentation of the encounter time.    Lillard Anes, NP 01/02/24 3:28 PM Medical Oncology and Hematology [facility] [address]  [place], [place] [address] Tel. [phone]    Fax. [phone]  *Total Encounter Time  as defined by the Centers for Medicare and Medicaid Services includes, in addition to the face-to-face time of a patient visit (documented in the note above) non-face-to-face time: obtaining and reviewing outside history, ordering and reviewing medications, tests or procedures, care coordination (communications with other health care professionals or caregivers) and documentation in the medical record.

## 2024-01-02 NOTE — Assessment & Plan Note (Signed)
 07/12/2023: Premenopausal patient palpated a lump in the left axilla. Mammogram did not reveal any primary breast tumor. 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 07/27/2023: Bone scan: No evidence of skeletal metastasis   Treatment plan: Neoadjuvant chemotherapy with dose dense Adriamycin and Cytoxan x 4 followed by Taxol weekly x 12 Breast conserving surgery with targeted node dissection at [facility] Adjuvant radiation therapy Adjuvant antiestrogen therapy with CDK inhibitors ------------------------------------------------------------------------------------------------------------------- Current treatment: Completed 4 cycles of dose dense Adriamycin and Cytoxan, today cycle 6 Taxol Chemo toxicities:  Breast Cancer on Taxol Tolerating treatment moderately well.  Persistent and unchanged intermittent peripheral neuropathy.  She can hardly feel the area where her cancer was at this point indicating + response to treatment.  -Reduce Taxol dosage to 65 mg/m2.   -Labs stable to proceed with dose reduced treatment.    Fatigue and nausea: discussed energy conservation and nausea medication recommendations.    RTC in 1 week for labs, f/u, and Taxol

## 2024-01-02 NOTE — Patient Instructions (Signed)

## 2024-01-09 ENCOUNTER — Inpatient Hospital Stay: Payer: Medicare HMO

## 2024-01-09 VITALS — BP 113/84 | HR 98 | Temp 98.2°F | Resp 17 | Wt 380.8 lb

## 2024-01-09 DIAGNOSIS — D509 Iron deficiency anemia, unspecified: Secondary | ICD-10-CM

## 2024-01-09 DIAGNOSIS — C50912 Malignant neoplasm of unspecified site of left female breast: Secondary | ICD-10-CM

## 2024-01-09 DIAGNOSIS — D500 Iron deficiency anemia secondary to blood loss (chronic): Secondary | ICD-10-CM

## 2024-01-09 DIAGNOSIS — Z5111 Encounter for antineoplastic chemotherapy: Secondary | ICD-10-CM | POA: Diagnosis not present

## 2024-01-09 LAB — CMP (CANCER CENTER ONLY)
ALT: 11 U/L (ref 0–44)
AST: 13 U/L — ABNORMAL LOW (ref 15–41)
Albumin: 3.4 g/dL — ABNORMAL LOW (ref 3.5–5.0)
Alkaline Phosphatase: 92 U/L (ref 38–126)
Anion gap: 7 (ref 5–15)
BUN: 8 mg/dL (ref 6–20)
CO2: 28 mmol/L (ref 22–32)
Calcium: 8.5 mg/dL — ABNORMAL LOW (ref 8.9–10.3)
Chloride: 103 mmol/L (ref 98–111)
Creatinine: 0.87 mg/dL (ref 0.44–1.00)
GFR, Estimated: >60 mL/min (ref >60)
Glucose, Bld: 162 mg/dL — ABNORMAL HIGH (ref 70–99)
Potassium: 3.6 mmol/L (ref 3.5–5.1)
Sodium: 138 mmol/L (ref 135–145)
Total Bilirubin: 0.4 mg/dL (ref 0.0–1.2)
Total Protein: 6.6 g/dL (ref 6.5–8.1)

## 2024-01-09 LAB — CBC WITH DIFFERENTIAL (CANCER CENTER ONLY)
Abs Immature Granulocytes: 0.02 10*3/uL (ref 0.00–0.07)
Basophils Absolute: 0 10*3/uL (ref 0.0–0.1)
Basophils Relative: 0 %
Eosinophils Absolute: 0.1 10*3/uL (ref 0.0–0.5)
Eosinophils Relative: 1 %
HCT: 33.4 % — ABNORMAL LOW (ref 36.0–46.0)
Hemoglobin: 10.7 g/dL — ABNORMAL LOW (ref 12.0–15.0)
Immature Granulocytes: 1 %
Lymphocytes Relative: 32 %
Lymphs Abs: 1.3 10*3/uL (ref 0.7–4.0)
MCH: 23.6 pg — ABNORMAL LOW (ref 26.0–34.0)
MCHC: 32 g/dL (ref 30.0–36.0)
MCV: 73.7 fL — ABNORMAL LOW (ref 80.0–100.0)
Monocytes Absolute: 0.2 10*3/uL (ref 0.1–1.0)
Monocytes Relative: 5 %
Neutro Abs: 2.6 10*3/uL (ref 1.7–7.7)
Neutrophils Relative %: 61 %
Platelet Count: 395 10*3/uL (ref 150–400)
RBC: 4.53 MIL/uL (ref 3.87–5.11)
RDW: 18.6 % — ABNORMAL HIGH (ref 11.5–15.5)
WBC Count: 4.3 10*3/uL (ref 4.0–10.5)
nRBC: 0 % (ref 0.0–0.2)

## 2024-01-09 MED ORDER — SODIUM CHLORIDE 0.9 % IV SOLN: Freq: Once | INTRAVENOUS | Status: AC

## 2024-01-09 MED ORDER — FAMOTIDINE IN NACL 20-0.9 MG/50ML-% IV SOLN
20.0000 mg | Freq: Once | INTRAVENOUS | Status: AC
  Administered 2024-01-09: 20 mg via INTRAVENOUS
  Filled 2024-01-09: qty 50

## 2024-01-09 MED ORDER — DEXAMETHASONE SODIUM PHOSPHATE 10 MG/ML IJ SOLN
10.0000 mg | Freq: Once | INTRAMUSCULAR | Status: AC
  Administered 2024-01-09: 10 mg via INTRAVENOUS
  Filled 2024-01-09: qty 1

## 2024-01-09 MED ORDER — SODIUM CHLORIDE 0.9 % IV SOLN
65.0000 mg/m2 | Freq: Once | INTRAVENOUS | Status: AC
  Administered 2024-01-09: 204 mg via INTRAVENOUS
  Filled 2024-01-09: qty 34

## 2024-01-09 MED ORDER — HEPARIN SOD (PORK) LOCK FLUSH 100 UNIT/ML IV SOLN
500.0000 [IU] | Freq: Once | INTRAVENOUS | Status: AC | PRN
  Administered 2024-01-09: 500 [IU]

## 2024-01-09 MED ORDER — SODIUM CHLORIDE 0.9% FLUSH
10.0000 mL | INTRAVENOUS | Status: DC | PRN
  Administered 2024-01-09: 10 mL

## 2024-01-09 MED ORDER — PALONOSETRON HCL INJECTION 0.25 MG/5ML
0.2500 mg | Freq: Once | INTRAVENOUS | Status: AC
  Administered 2024-01-09: 0.25 mg via INTRAVENOUS
  Filled 2024-01-09: qty 5

## 2024-01-09 MED ORDER — SODIUM CHLORIDE 0.9% FLUSH
10.0000 mL | Freq: Once | INTRAVENOUS | Status: AC
  Administered 2024-01-09: 10 mL

## 2024-01-09 MED ORDER — DIPHENHYDRAMINE HCL 50 MG/ML IJ SOLN
25.0000 mg | Freq: Once | INTRAMUSCULAR | Status: AC
  Administered 2024-01-09: 25 mg via INTRAVENOUS
  Filled 2024-01-09: qty 1

## 2024-01-17 ENCOUNTER — Inpatient Hospital Stay: Payer: Medicare HMO

## 2024-01-17 ENCOUNTER — Inpatient Hospital Stay (HOSPITAL_BASED_OUTPATIENT_CLINIC_OR_DEPARTMENT_OTHER): Payer: Medicare HMO | Admitting: Hematology and Oncology

## 2024-01-17 VITALS — BP 92/67 | HR 112 | Temp 99.1°F | Resp 16

## 2024-01-17 DIAGNOSIS — C50912 Malignant neoplasm of unspecified site of left female breast: Secondary | ICD-10-CM

## 2024-01-17 DIAGNOSIS — D509 Iron deficiency anemia, unspecified: Secondary | ICD-10-CM

## 2024-01-17 DIAGNOSIS — D500 Iron deficiency anemia secondary to blood loss (chronic): Secondary | ICD-10-CM

## 2024-01-17 DIAGNOSIS — C773 Secondary and unspecified malignant neoplasm of axilla and upper limb lymph nodes: Secondary | ICD-10-CM | POA: Diagnosis not present

## 2024-01-17 DIAGNOSIS — Z5111 Encounter for antineoplastic chemotherapy: Secondary | ICD-10-CM | POA: Diagnosis not present

## 2024-01-17 LAB — CBC WITH DIFFERENTIAL (CANCER CENTER ONLY)
Abs Immature Granulocytes: 0.02 10*3/uL (ref 0.00–0.07)
Basophils Absolute: 0 10*3/uL (ref 0.0–0.1)
Basophils Relative: 1 %
Eosinophils Absolute: 0.1 10*3/uL (ref 0.0–0.5)
Eosinophils Relative: 1 %
HCT: 34.1 % — ABNORMAL LOW (ref 36.0–46.0)
Hemoglobin: 11 g/dL — ABNORMAL LOW (ref 12.0–15.0)
Immature Granulocytes: 1 %
Lymphocytes Relative: 38 %
Lymphs Abs: 1.4 10*3/uL (ref 0.7–4.0)
MCH: 23.9 pg — ABNORMAL LOW (ref 26.0–34.0)
MCHC: 32.3 g/dL (ref 30.0–36.0)
MCV: 74.1 fL — ABNORMAL LOW (ref 80.0–100.0)
Monocytes Absolute: 0.2 10*3/uL (ref 0.1–1.0)
Monocytes Relative: 6 %
Neutro Abs: 2 10*3/uL (ref 1.7–7.7)
Neutrophils Relative %: 53 %
Platelet Count: 412 10*3/uL — ABNORMAL HIGH (ref 150–400)
RBC: 4.6 MIL/uL (ref 3.87–5.11)
RDW: 18.7 % — ABNORMAL HIGH (ref 11.5–15.5)
WBC Count: 3.7 10*3/uL — ABNORMAL LOW (ref 4.0–10.5)
nRBC: 0.5 % — ABNORMAL HIGH (ref 0.0–0.2)

## 2024-01-17 LAB — CMP (CANCER CENTER ONLY)
ALT: 13 U/L (ref 0–44)
AST: 14 U/L — ABNORMAL LOW (ref 15–41)
Albumin: 3.5 g/dL (ref 3.5–5.0)
Alkaline Phosphatase: 99 U/L (ref 38–126)
Anion gap: 8 (ref 5–15)
BUN: 9 mg/dL (ref 6–20)
CO2: 26 mmol/L (ref 22–32)
Calcium: 8.9 mg/dL (ref 8.9–10.3)
Chloride: 103 mmol/L (ref 98–111)
Creatinine: 0.82 mg/dL (ref 0.44–1.00)
GFR, Estimated: >60 mL/min (ref >60)
Glucose, Bld: 214 mg/dL — ABNORMAL HIGH (ref 70–99)
Potassium: 3.4 mmol/L — ABNORMAL LOW (ref 3.5–5.1)
Sodium: 137 mmol/L (ref 135–145)
Total Bilirubin: 0.3 mg/dL (ref 0.0–1.2)
Total Protein: 6.8 g/dL (ref 6.5–8.1)

## 2024-01-17 LAB — PREGNANCY, URINE: Preg Test, Ur: NEGATIVE

## 2024-01-17 MED ORDER — PACLITAXEL CHEMO INJECTION 300 MG/50ML
65.0000 mg/m2 | Freq: Once | INTRAVENOUS | Status: AC
  Administered 2024-01-17: 204 mg via INTRAVENOUS
  Filled 2024-01-17: qty 34

## 2024-01-17 MED ORDER — METOPROLOL SUCCINATE ER 100 MG PO TB24: 100.0000 mg | ORAL_TABLET | Freq: Every day | ORAL | 6 refills | Status: AC

## 2024-01-17 MED ORDER — DIPHENHYDRAMINE HCL 50 MG/ML IJ SOLN
25.0000 mg | Freq: Once | INTRAMUSCULAR | Status: AC
  Administered 2024-01-17: 25 mg via INTRAVENOUS
  Filled 2024-01-17: qty 1

## 2024-01-17 MED ORDER — HEPARIN SOD (PORK) LOCK FLUSH 100 UNIT/ML IV SOLN
500.0000 [IU] | Freq: Once | INTRAVENOUS | Status: DC | PRN
Start: 2024-01-17 — End: 2024-01-17

## 2024-01-17 MED ORDER — PALONOSETRON HCL INJECTION 0.25 MG/5ML
0.2500 mg | Freq: Once | INTRAVENOUS | Status: AC
Start: 2024-01-17 — End: 2024-01-17
  Administered 2024-01-17: 0.25 mg via INTRAVENOUS
  Filled 2024-01-17: qty 5

## 2024-01-17 MED ORDER — SODIUM CHLORIDE 0.9 % IV SOLN: Freq: Once | INTRAVENOUS | Status: AC

## 2024-01-17 MED ORDER — SODIUM CHLORIDE 0.9% FLUSH
10.0000 mL | Freq: Once | INTRAVENOUS | Status: AC
  Administered 2024-01-17: 10 mL

## 2024-01-17 MED ORDER — FAMOTIDINE IN NACL 20-0.9 MG/50ML-% IV SOLN
20.0000 mg | Freq: Once | INTRAVENOUS | Status: AC
  Administered 2024-01-17: 20 mg via INTRAVENOUS
  Filled 2024-01-17: qty 50

## 2024-01-17 MED ORDER — NITROFURANTOIN MONOHYD MACRO 100 MG PO CAPS: 100.0000 mg | ORAL_CAPSULE | Freq: Two times a day (BID) | ORAL | 0 refills | Status: DC

## 2024-01-17 MED ORDER — DEXAMETHASONE SODIUM PHOSPHATE 10 MG/ML IJ SOLN
10.0000 mg | Freq: Once | INTRAMUSCULAR | Status: AC
  Administered 2024-01-17: 10 mg via INTRAVENOUS
  Filled 2024-01-17: qty 1

## 2024-01-17 MED ORDER — SODIUM CHLORIDE 0.9% FLUSH: 10.0000 mL | INTRAVENOUS | Status: DC | PRN

## 2024-01-17 NOTE — Patient Instructions (Signed)

## 2024-01-17 NOTE — Progress Notes (Signed)
 Per Dr. Pamelia Hoit, ok to treat with elevated HR and low SBP

## 2024-01-17 NOTE — Progress Notes (Signed)
 Patient Care Team: [name], MD as PCP - General (Internal Medicine) Serena Croissant, MD as Medical Oncologist (Hematology and Oncology) Pershing Proud, RN as Oncology Nurse Navigator Donnelly Angelica, RN as Oncology Nurse Navigator  DIAGNOSIS:  Encounter Diagnosis  Name Primary?   Breast cancer metastasized to axillary lymph node, left (HCC) Yes    SUMMARY OF ONCOLOGIC HISTORY: Oncology History  Breast cancer metastasized to axillary lymph node, left (HCC)  07/12/2023 Initial Diagnosis   Patient palpated a lump in the left axilla.  Mammogram did not reveal any primary breast tumor.  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+   08/15/2023 -  Chemotherapy   Patient is on Treatment Plan : BREAST ADJUVANT DOSE DENSE AC q14d / PACLitaxel q7d       CHIEF COMPLIANT: Cycle 8 Taxol  HISTORY OF PRESENT ILLNESS:  History of Present Illness The patient, who is currently undergoing chemotherapy, reports persistent neuropathy in three toes, which has not worsened since the last visit. She also notes decreased energy levels but is making efforts to stay active. The patient has been experiencing a burning sensation during urination for the past two days, raising concerns about a possible UTI. She acknowledges the need to increase her water intake. The patient also reports an elevated heart rate, despite taking medication for it. The heart rate is consistently around 120-130 beats per minute. The patient's grandmother is pregnant, and the patient is excited about becoming a godparent.     ALLERGIES:  is allergic to doxycycline, fish allergy, fish oil, other, pollen extract, citalopram, amoxicillin, penicillins, and strawberry extract.  MEDICATIONS:  Current Outpatient Medications  Medication Sig Dispense Refill   metoprolol succinate (TOPROL XL) 100 MG 24 hr tablet Take 1 tablet (100 mg total) by mouth daily. Take with or immediately following a  meal. 30 tablet 6   nitrofurantoin, macrocrystal-monohydrate, (MACROBID) 100 MG capsule Take 1 capsule (100 mg total) by mouth 2 (two) times daily. 14 capsule 0   albuterol (PROAIR HFA) 108 (90 Base) MCG/ACT inhaler Use 2 puffs every 4 hours as needed for cough or wheeze.  May use 2 puffs 10-20 minutes prior to exercise. 1 Inhaler 0   apixaban (ELIQUIS) 5 MG TABS tablet Take 1 tablet (5 mg total) by mouth 2 (two) times daily. 60 tablet 3   cetirizine (ZYRTEC) 10 MG tablet Take 10 mg by mouth daily.     cyclobenzaprine (FLEXERIL) 10 MG tablet Take 10 mg by mouth 3 (three) times daily.     dexamethasone (DECADRON) 4 MG tablet Take 1 tablet day after chemo and 1 tablet 2 days after chemo with Adriamycin and Cytoxan 8 tablet 0   diphenoxylate-atropine (LOMOTIL) 2.5-0.025 MG tablet Take 1 tablet by mouth 4 (four) times daily as needed for diarrhea or loose stools. 30 tablet 3   fluconazole (DIFLUCAN) 150 MG tablet Take 1 tab x 1 and repeat again after 5 days 2 tablet 1   gabapentin (NEURONTIN) 100 MG capsule Take 1 capsule (100 mg total) by mouth 3 (three) times daily. 270 capsule 0   glipiZIDE (GLUCOTROL XL) 10 MG 24 hr tablet Take 10 mg by mouth daily with breakfast.     ibuprofen (ADVIL) 200 MG tablet Take 800 mg by mouth every 6 (six) hours as needed. (Patient not taking: Reported on 01/17/2024)     lidocaine (LIDODERM) 5 % Place 1 patch onto the skin daily. Remove & Discard patch within  12 hours or as directed by MD 30 patch 0   lidocaine-prilocaine (EMLA) cream Apply to affected area once 30 g 3   LINZESS 72 MCG capsule Take 72 mcg by mouth daily as needed. (Patient not taking: Reported on 10/06/2023)     losartan (COZAAR) 100 MG tablet Take 1 tablet (100 mg total) by mouth daily. 30 tablet 3   magic mouthwash (lidocaine, diphenhydrAMINE, alum & mag hydroxide) suspension Swish and swallow 5 mLs four times daily as needed for mouth pain. (Patient not taking: Reported on 01/17/2024) 360 mL 1    montelukast (SINGULAIR) 10 MG tablet Take 10 mg by mouth at bedtime.     MOUNJARO 10 MG/0.5ML Pen Inject 10 mg into the skin once a week.     omeprazole (PRILOSEC) 40 MG capsule TAKE 1 CAPSULE BY MOUTH TWICE A DAY 180 capsule 1   Oxycodone HCl 20 MG TABS Take 1 tablet by mouth 4 (four) times daily as needed.     prochlorperazine (COMPAZINE) 10 MG tablet Take 1 tablet (10 mg total) by mouth every 6 (six) hours as needed for nausea or vomiting. (Patient not taking: Reported on 01/17/2024) 30 tablet 1   sulfamethoxazole-trimethoprim (BACTRIM DS) 800-160 MG tablet Take 1 tablet by mouth 2 (two) times daily. 14 tablet 0   SUMAtriptan (IMITREX) 100 MG tablet Take 100 mg by mouth as directed. (Patient not taking: Reported on 09/21/2023)     topiramate (TOPAMAX) 50 MG tablet Take 50 mg by mouth at bedtime. (Patient not taking: Reported on 09/21/2023)     triamcinolone (KENALOG) 0.025 % ointment Apply 1 Application topically 2 (two) times daily. 80 each 0   No current facility-administered medications for this visit.   Facility-Administered Medications Ordered in Other Visits  Medication Dose Route Frequency Provider Last Rate Last Admin   heparin lock flush 100 unit/mL  500 Units Intracatheter Once Serena Croissant, MD       sodium chloride flush (NS) 0.9 % injection 10 mL  10 mL Intracatheter Once Serena Croissant, MD        PHYSICAL EXAMINATION: ECOG PERFORMANCE STATUS: 1 - Symptomatic but completely ambulatory  There were no vitals filed for this visit. There were no vitals filed for this visit.  Physical Exam   (exam performed in the presence of a chaperone)  LABORATORY DATA:  I have reviewed the data as listed    Latest Ref Rng & Units 01/17/2024   10:28 AM 01/09/2024    1:50 PM 01/02/2024    1:36 PM  CMP  Glucose 70 - 99 mg/dL 644  034  742   BUN 6 - 20 mg/dL 9  8  10    Creatinine 0.44 - 1.00 mg/dL 5.95  6.38  7.56   Sodium 135 - 145 mmol/L 137  138  135   Potassium 3.5 - 5.1 mmol/L 3.4   3.6  3.8   Chloride 98 - 111 mmol/L 103  103  102   CO2 22 - 32 mmol/L 26  28  26    Calcium 8.9 - 10.3 mg/dL 8.9  8.5  9.0   Total Protein 6.5 - 8.1 g/dL 6.8  6.6  6.9   Total Bilirubin 0.0 - 1.2 mg/dL 0.3  0.4  0.4   Alkaline Phos 38 - 126 U/L 99  92  108   AST 15 - 41 U/L 14  13  22    ALT 0 - 44 U/L 13  11  16      Lab  Results  Component Value Date   WBC 3.7 (L) 01/17/2024   HGB 11.0 (L) 01/17/2024   HCT 34.1 (L) 01/17/2024   MCV 74.1 (L) 01/17/2024   PLT 412 (H) 01/17/2024   NEUTROABS 2.0 01/17/2024    ASSESSMENT & PLAN:  Breast cancer metastasized to axillary lymph node, left (HCC) 07/12/2023: Premenopausal patient palpated a lump in the left axilla. Mammogram did not reveal any primary breast tumor. 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 07/27/2023: Bone scan: No evidence of skeletal metastasis   Treatment plan: Neoadjuvant chemotherapy with dose dense Adriamycin and Cytoxan x 4 followed by Taxol weekly x 12 Breast conserving surgery with targeted node dissection at [facility] Adjuvant radiation therapy Adjuvant antiestrogen therapy with CDK inhibitors ------------------------------------------------------------------------------------------------------------------- Current treatment: Completed 4 cycles of dose dense Adriamycin and Cytoxan, today cycle 8 Taxol Chemo toxicities: Nausea and vomiting: Previously added Aloxi Nailbed discoloration Alopecia Chemo induced anemia Chemo-induced peripheral neuropathy: Reduced the dosage of Taxol Fatigue  Our goal is to at least get her to 10 cycles of Taxol and then we can decide if we need to discontinue her chemo at that time. She tells me that she is going to be a grandmother soon.  Her daughter is in the fourth month of her pregnancy. Return to clinic weekly for  Taxol treatments ------------------------------------- Assessment and Plan Assessment & Plan Breast cancer Undergoing chemotherapy with stable neuropathy. Energy low, bowel movements improved, taste and appetite maintained. Plan to complete chemotherapy, followed by imaging, surgery, and radiation. - Continue chemotherapy regimen. - Order breast imaging post-chemotherapy. - Plan surgical consultation post-imaging. - Plan for radiation therapy post-surgery.  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 Dose reduction beneficial. Completing all cycles could worsen neuropathy; decision based on symptoms. - Monitor neuropathy symptoms closely. - Consider stopping chemotherapy if neuropathy worsens.  Tachycardia Heart rate elevated at 120-130 bpm. On metoprolol 50 mg daily. Plan to increase dose for heart rate control, expecting normalization post-chemotherapy. - Increase metoprolol dose to 100 mg daily. - Monitor heart rate response to increased dose.  Urinary tract infection (UTI) Burning sensation during urination suggests UTI. Empirical treatment initiated with Macrobid, no allergies reported. - Prescribe Macrobid (nitrofurantoin) 100 mg twice daily for 7 days. - Increase water intake.  Follow-up Reassess in two weeks to evaluate chemotherapy effects and neuropathy progression. Discuss post-chemotherapy plan, including potential cessation if neuropathy worsens. - Schedule follow-up appointment in two weeks. - Discuss post-chemotherapy plan at next visit.      No orders of the defined types were placed in this encounter.  The patient has a good understanding of the overall plan. she agrees with it. she will call with any problems that may develop before the next visit here. Total time spent: 30 mins including face to face time and time spent for planning, charting and co-ordination of care   Tamsen Meek, MD 01/17/24

## 2024-01-17 NOTE — Assessment & Plan Note (Signed)
 07/12/2023: Premenopausal patient palpated a lump in the left axilla. Mammogram did not reveal any primary breast tumor. 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 07/27/2023: Bone scan: No evidence of skeletal metastasis   Treatment plan: Neoadjuvant chemotherapy with dose dense Adriamycin and Cytoxan x 4 followed by Taxol weekly x 12 Breast conserving surgery with targeted node dissection at [facility] Adjuvant radiation therapy Adjuvant antiestrogen therapy with CDK inhibitors ------------------------------------------------------------------------------------------------------------------- Current treatment: Completed 4 cycles of dose dense Adriamycin and Cytoxan, today cycle 8 Taxol Chemo toxicities: Nausea and vomiting: Previously added Aloxi Nailbed discoloration Alopecia Chemo induced anemia Chemo-induced peripheral neuropathy: Reduced the dosage of Taxol Fatigue  Return to clinic weekly for Taxol treatments

## 2024-01-24 ENCOUNTER — Inpatient Hospital Stay: Payer: Medicare HMO

## 2024-01-24 ENCOUNTER — Telehealth: Payer: Self-pay

## 2024-01-24 NOTE — Telephone Encounter (Signed)
 I called the pt to left her know that she  was over an hour late for her flush appointment,  no body answered and I left her a Voicemail asking her to call back.

## 2024-01-26 ENCOUNTER — Encounter: Payer: Self-pay | Admitting: Hematology and Oncology

## 2024-01-30 ENCOUNTER — Encounter: Payer: Self-pay | Admitting: *Deleted

## 2024-01-31 ENCOUNTER — Other Ambulatory Visit: Payer: Self-pay | Admitting: Hematology and Oncology

## 2024-01-31 ENCOUNTER — Inpatient Hospital Stay: Payer: Medicare HMO

## 2024-01-31 ENCOUNTER — Encounter: Payer: Self-pay | Admitting: *Deleted

## 2024-01-31 ENCOUNTER — Inpatient Hospital Stay: Payer: Medicare HMO | Attending: Physician Assistant | Admitting: Hematology and Oncology

## 2024-01-31 VITALS — BP 125/80 | HR 109 | Temp 98.6°F | Resp 18 | Ht 69.0 in | Wt >= 6400 oz

## 2024-01-31 DIAGNOSIS — Z923 Personal history of irradiation: Secondary | ICD-10-CM | POA: Diagnosis not present

## 2024-01-31 DIAGNOSIS — C50912 Malignant neoplasm of unspecified site of left female breast: Secondary | ICD-10-CM | POA: Diagnosis present

## 2024-01-31 DIAGNOSIS — Z7984 Long term (current) use of oral hypoglycemic drugs: Secondary | ICD-10-CM | POA: Diagnosis not present

## 2024-01-31 DIAGNOSIS — D500 Iron deficiency anemia secondary to blood loss (chronic): Secondary | ICD-10-CM

## 2024-01-31 DIAGNOSIS — Z9221 Personal history of antineoplastic chemotherapy: Secondary | ICD-10-CM | POA: Diagnosis not present

## 2024-01-31 DIAGNOSIS — C773 Secondary and unspecified malignant neoplasm of axilla and upper limb lymph nodes: Secondary | ICD-10-CM | POA: Insufficient documentation

## 2024-01-31 DIAGNOSIS — R040 Epistaxis: Secondary | ICD-10-CM | POA: Insufficient documentation

## 2024-01-31 DIAGNOSIS — D573 Sickle-cell trait: Secondary | ICD-10-CM | POA: Insufficient documentation

## 2024-01-31 DIAGNOSIS — Z7901 Long term (current) use of anticoagulants: Secondary | ICD-10-CM | POA: Insufficient documentation

## 2024-01-31 DIAGNOSIS — Z79899 Other long term (current) drug therapy: Secondary | ICD-10-CM | POA: Diagnosis not present

## 2024-01-31 DIAGNOSIS — D509 Iron deficiency anemia, unspecified: Secondary | ICD-10-CM

## 2024-01-31 LAB — CBC WITH DIFFERENTIAL (CANCER CENTER ONLY)
Abs Immature Granulocytes: 0.04 10*3/uL (ref 0.00–0.07)
Basophils Absolute: 0 10*3/uL (ref 0.0–0.1)
Basophils Relative: 0 %
Eosinophils Absolute: 0 10*3/uL (ref 0.0–0.5)
Eosinophils Relative: 1 %
HCT: 31.8 % — ABNORMAL LOW (ref 36.0–46.0)
Hemoglobin: 10.3 g/dL — ABNORMAL LOW (ref 12.0–15.0)
Immature Granulocytes: 1 %
Lymphocytes Relative: 13 %
Lymphs Abs: 0.9 10*3/uL (ref 0.7–4.0)
MCH: 24.5 pg — ABNORMAL LOW (ref 26.0–34.0)
MCHC: 32.4 g/dL (ref 30.0–36.0)
MCV: 75.5 fL — ABNORMAL LOW (ref 80.0–100.0)
Monocytes Absolute: 0.5 10*3/uL (ref 0.1–1.0)
Monocytes Relative: 8 %
Neutro Abs: 5.2 10*3/uL (ref 1.7–7.7)
Neutrophils Relative %: 77 %
Platelet Count: 421 10*3/uL — ABNORMAL HIGH (ref 150–400)
RBC: 4.21 MIL/uL (ref 3.87–5.11)
RDW: 19.5 % — ABNORMAL HIGH (ref 11.5–15.5)
WBC Count: 6.8 10*3/uL (ref 4.0–10.5)
nRBC: 0 % (ref 0.0–0.2)

## 2024-01-31 LAB — CMP (CANCER CENTER ONLY)
ALT: 14 U/L (ref 0–44)
AST: 19 U/L (ref 15–41)
Albumin: 3.4 g/dL — ABNORMAL LOW (ref 3.5–5.0)
Alkaline Phosphatase: 100 U/L (ref 38–126)
Anion gap: 6 (ref 5–15)
BUN: 12 mg/dL (ref 6–20)
CO2: 27 mmol/L (ref 22–32)
Calcium: 8.7 mg/dL — ABNORMAL LOW (ref 8.9–10.3)
Chloride: 109 mmol/L (ref 98–111)
Creatinine: 1.02 mg/dL — ABNORMAL HIGH (ref 0.44–1.00)
GFR, Estimated: >60 mL/min (ref >60)
Glucose, Bld: 214 mg/dL — ABNORMAL HIGH (ref 70–99)
Potassium: 3.5 mmol/L (ref 3.5–5.1)
Sodium: 142 mmol/L (ref 135–145)
Total Bilirubin: 0.2 mg/dL (ref 0.0–1.2)
Total Protein: 6.7 g/dL (ref 6.5–8.1)

## 2024-01-31 MED ORDER — SODIUM CHLORIDE 0.9% FLUSH
10.0000 mL | Freq: Once | INTRAVENOUS | Status: AC
  Administered 2024-01-31: 10 mL

## 2024-01-31 NOTE — Assessment & Plan Note (Signed)
 07/12/2023: Premenopausal patient palpated a lump in the left axilla. Mammogram did not reveal any primary breast tumor. 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 07/27/2023: Bone scan: No evidence of skeletal metastasis   Treatment plan: Neoadjuvant chemotherapy with dose dense Adriamycin and Cytoxan x 4 followed by Taxol weekly x 12 Breast conserving surgery with targeted node dissection at [facility] Adjuvant radiation therapy Adjuvant antiestrogen therapy with CDK inhibitors ------------------------------------------------------------------------------------------------------------------- Current treatment: Completed 4 cycles of dose dense Adriamycin and Cytoxan, today cycle 9 Taxol Chemo toxicities: Nausea and vomiting: Previously added Aloxi Nailbed discoloration Alopecia Chemo induced anemia Chemo-induced peripheral neuropathy: Reduced the dosage of Taxol Fatigue   Our goal is to at least get her to 10 cycles of Taxol and then we can decide if we need to discontinue her chemo at that time. She tells me that she is going to be a grandmother soon.  Her daughter is in the fourth month of her pregnancy. Return to clinic weekly for Taxol treatments

## 2024-01-31 NOTE — Progress Notes (Signed)
 Patient Care Team: [name], MD as PCP - General (Internal Medicine) Serena Croissant, MD as Medical Oncologist (Hematology and Oncology) Pershing Proud, RN as Oncology Nurse Navigator Donnelly Angelica, RN as Oncology Nurse Navigator  DIAGNOSIS:  Encounter Diagnosis  Name Primary?   Breast cancer metastasized to axillary lymph node, left (HCC) Yes    SUMMARY OF ONCOLOGIC HISTORY: Oncology History  Breast cancer metastasized to axillary lymph node, left (HCC)  07/12/2023 Initial Diagnosis   Patient palpated a lump in the left axilla.  Mammogram did not reveal any primary breast tumor.  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+   08/15/2023 -  Chemotherapy   Patient is on Treatment Plan : BREAST ADJUVANT DOSE DENSE AC q14d / PACLitaxel q7d       CHIEF COMPLIANT: Stopping chemotherapy for Taxol toxicity  HISTORY OF PRESENT ILLNESS:  History of Present Illness The patient, undergoing chemotherapy, presents with severe side effects including skin peeling and nose bleeding. The skin peeling is so severe that the patient has resorted to filing the skin, which results in deep wounds. The patient also reports that the skin does not seem to be healing, despite washing regularly. The patient also experiences nose bleeding and scabs. The patient's discomfort is so severe that it has led to the decision to stop chemotherapy.  In addition to the patient's own health concerns, she also discusses her daughter's recent diagnosis of sickle cell disease. The patient, who is a carrier for sickle cell, is concerned about the implications of this diagnosis for her daughter's pregnancy.     ALLERGIES:  is allergic to doxycycline, fish allergy, fish oil, other, pollen extract, citalopram, amoxicillin, penicillins, and strawberry extract.  MEDICATIONS:  Current Outpatient Medications  Medication Sig Dispense Refill   albuterol (PROAIR HFA) 108 (90 Base)  MCG/ACT inhaler Use 2 puffs every 4 hours as needed for cough or wheeze.  May use 2 puffs 10-20 minutes prior to exercise. 1 Inhaler 0   apixaban (ELIQUIS) 5 MG TABS tablet Take 1 tablet (5 mg total) by mouth 2 (two) times daily. 60 tablet 3   cetirizine (ZYRTEC) 10 MG tablet Take 10 mg by mouth daily.     cyclobenzaprine (FLEXERIL) 10 MG tablet Take 10 mg by mouth 3 (three) times daily.     dexamethasone (DECADRON) 4 MG tablet Take 1 tablet day after chemo and 1 tablet 2 days after chemo with Adriamycin and Cytoxan 8 tablet 0   diphenoxylate-atropine (LOMOTIL) 2.5-0.025 MG tablet Take 1 tablet by mouth 4 (four) times daily as needed for diarrhea or loose stools. 30 tablet 3   fluconazole (DIFLUCAN) 150 MG tablet Take 1 tab x 1 and repeat again after 5 days 2 tablet 1   gabapentin (NEURONTIN) 100 MG capsule Take 1 capsule (100 mg total) by mouth 3 (three) times daily. 270 capsule 0   glipiZIDE (GLUCOTROL XL) 10 MG 24 hr tablet Take 10 mg by mouth daily with breakfast.     ibuprofen (ADVIL) 200 MG tablet Take 800 mg by mouth every 6 (six) hours as needed. (Patient not taking: Reported on 01/17/2024)     lidocaine (LIDODERM) 5 % Place 1 patch onto the skin daily. Remove & Discard patch within 12 hours or as directed by MD 30 patch 0   lidocaine-prilocaine (EMLA) cream Apply to affected area once 30 g 3   LINZESS 72 MCG capsule Take 72 mcg by mouth daily as  needed. (Patient not taking: Reported on 10/06/2023)     losartan (COZAAR) 100 MG tablet Take 1 tablet (100 mg total) by mouth daily. 30 tablet 3   magic mouthwash (lidocaine, diphenhydrAMINE, alum & mag hydroxide) suspension Swish and swallow 5 mLs four times daily as needed for mouth pain. (Patient not taking: Reported on 01/17/2024) 360 mL 1   metoprolol succinate (TOPROL XL) 100 MG 24 hr tablet Take 1 tablet (100 mg total) by mouth daily. Take with or immediately following a meal. 30 tablet 6   montelukast (SINGULAIR) 10 MG tablet Take 10 mg by  mouth at bedtime.     MOUNJARO 10 MG/0.5ML Pen Inject 10 mg into the skin once a week.     nitrofurantoin, macrocrystal-monohydrate, (MACROBID) 100 MG capsule Take 1 capsule (100 mg total) by mouth 2 (two) times daily. 14 capsule 0   omeprazole (PRILOSEC) 40 MG capsule TAKE 1 CAPSULE BY MOUTH TWICE A DAY 180 capsule 1   Oxycodone HCl 20 MG TABS Take 1 tablet by mouth 4 (four) times daily as needed.     prochlorperazine (COMPAZINE) 10 MG tablet Take 1 tablet (10 mg total) by mouth every 6 (six) hours as needed for nausea or vomiting. (Patient not taking: Reported on 01/17/2024) 30 tablet 1   sulfamethoxazole-trimethoprim (BACTRIM DS) 800-160 MG tablet Take 1 tablet by mouth 2 (two) times daily. 14 tablet 0   SUMAtriptan (IMITREX) 100 MG tablet Take 100 mg by mouth as directed. (Patient not taking: Reported on 09/21/2023)     topiramate (TOPAMAX) 50 MG tablet Take 50 mg by mouth at bedtime. (Patient not taking: Reported on 09/21/2023)     triamcinolone (KENALOG) 0.025 % ointment Apply 1 Application topically 2 (two) times daily. 80 each 0   No current facility-administered medications for this visit.   Facility-Administered Medications Ordered in Other Visits  Medication Dose Route Frequency Provider Last Rate Last Admin   heparin lock flush 100 unit/mL  500 Units Intracatheter Once Serena Croissant, MD       sodium chloride flush (NS) 0.9 % injection 10 mL  10 mL Intracatheter Once Serena Croissant, MD        PHYSICAL EXAMINATION: ECOG PERFORMANCE STATUS: 1 - Symptomatic but completely ambulatory  Vitals:   01/31/24 1215  BP: 125/80  Pulse: (!) 109  Resp: 18  Temp: 98.6 F (37 C)  SpO2: 97%   Filed Weights   01/31/24 1215  Weight: (!) 404 lb 11.2 oz (183.6 kg)    LABORATORY DATA:  I have reviewed the data as listed    Latest Ref Rng & Units 01/31/2024   11:32 AM 01/17/2024   10:28 AM 01/09/2024    1:50 PM  CMP  Glucose 70 - 99 mg/dL 098  119  147   BUN 6 - 20 mg/dL 12  9  8     Creatinine 0.44 - 1.00 mg/dL 8.29  5.62  1.30   Sodium 135 - 145 mmol/L 142  137  138   Potassium 3.5 - 5.1 mmol/L 3.5  3.4  3.6   Chloride 98 - 111 mmol/L 109  103  103   CO2 22 - 32 mmol/L 27  26  28    Calcium 8.9 - 10.3 mg/dL 8.7  8.9  8.5   Total Protein 6.5 - 8.1 g/dL 6.7  6.8  6.6   Total Bilirubin 0.0 - 1.2 mg/dL 0.2  0.3  0.4   Alkaline Phos 38 - 126 U/L 100  99  92   AST 15 - 41 U/L 19  14  13    ALT 0 - 44 U/L 14  13  11      Lab Results  Component Value Date   WBC 6.8 01/31/2024   HGB 10.3 (L) 01/31/2024   HCT 31.8 (L) 01/31/2024   MCV 75.5 (L) 01/31/2024   PLT 421 (H) 01/31/2024   NEUTROABS 5.2 01/31/2024    ASSESSMENT & PLAN:  Breast cancer metastasized to axillary lymph node, left (HCC) 07/12/2023: Premenopausal patient palpated a lump in the left axilla. Mammogram did not reveal any primary breast tumor. 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 07/27/2023: Bone scan: No evidence of skeletal metastasis   Treatment plan: Neoadjuvant chemotherapy with dose dense Adriamycin and Cytoxan x 4 followed by Taxol weekly x 8 (stopped early because of desquamation in the feet and neuropathy) Breast conserving surgery with targeted node dissection at [facility] Adjuvant radiation therapy Adjuvant antiestrogen therapy with CDK inhibitors ------------------------------------------------------------------------------------------------------------------- Current treatment: Completed 4 cycles of dose dense Adriamycin and Cytoxan, today cycle 9 Taxol (discontinuing chemo) Chemo toxicities: Severe and worsening peripheral neuropathy with desquamation of the bottom of her feet: I recommend discontinuation of further chemotherapy.   We will set her up for another mammogram and ultrasound followed by evaluation by surgery.    Patient wants to review her mammograms at [facility]. ------------------------------------- Assessment and Plan Assessment & Plan Breast cancer metastasized to axillary lymph node, left She has been undergoing chemotherapy for breast cancer with metastasis to the axillary lymph node. Due to severe side effects, including significant pain and discomfort in her feet, chemotherapy is discontinued as the risks outweigh the benefits. The next step is surgical intervention. Imaging will be repeated to assist in surgical planning. - Discontinue chemotherapy - Order repeat mammogram and ultrasound at [facility] - Refer to a surgeon for evaluation and surgical planning - Remove port during surgery  Chemotherapy-induced neuropathy She is experiencing severe pain and discomfort in her feet, likely due to chemotherapy-induced neuropathy. This has contributed to the decision to discontinue chemotherapy.  Epistaxis She reports episodes of epistaxis, which may be related to her current treatment regimen or underlying conditions.  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 There is a discussion about her daughter's recent diagnosis of sickle cell disease, but it is clarified that the daughter is also a carrier, not affected by the disease. - Review hemoglobin electrophoresis report for daughter's condition  Follow-up She will need follow-up after surgery to discuss results and further management, including potential radiation therapy. - Schedule follow-up appointment after surgery to discuss results - Plan for radiation therapy post-surgery, typically four weeks with twenty treatments      Orders Placed This Encounter  Procedures   MM DIAG BREAST TOMO BILATERAL    Standing Status:   Future    Expected Date:   02/14/2024    Expiration Date:   01/30/2025    Reason for Exam (SYMPTOM  OR DIAGNOSIS REQUIRED):   post neoadj chemo mammograms     Is the patient pregnant?:   No    Preferred imaging location?:   External             [facility]    Release to patient:   Immediate   [place] LIMITED ULTRASOUND  INCLUDING AXILLA LEFT BREAST     Standing Status:   Future    Expiration Date:   01/30/2025    Scheduling Instructions:     At [facility]    Reason for Exam (SYMPTOM  OR DIAGNOSIS REQUIRED):   post neoadj chemo    Preferred Imaging Location?:   External    Release to patient:   Immediate   The patient has a good understanding of the overall plan. she agrees with it. she will call with any problems that may develop before the next visit here. Total time spent: 30 mins including face to face time and time spent for planning, charting and co-ordination of care   Tamsen Meek, MD 01/31/24

## 2024-02-01 ENCOUNTER — Telehealth: Payer: Self-pay | Admitting: *Deleted

## 2024-02-01 NOTE — Telephone Encounter (Signed)
 Orders faxed to [facility] imaging scheduling for MM/US post neochemo.

## 2024-02-07 ENCOUNTER — Inpatient Hospital Stay: Payer: Medicare HMO

## 2024-02-12 ENCOUNTER — Other Ambulatory Visit: Payer: Self-pay | Admitting: Adult Health

## 2024-02-13 ENCOUNTER — Encounter: Payer: Self-pay | Admitting: Hematology and Oncology

## 2024-02-14 ENCOUNTER — Telehealth: Payer: Self-pay | Admitting: *Deleted

## 2024-02-14 ENCOUNTER — Encounter: Payer: Self-pay | Admitting: *Deleted

## 2024-02-14 NOTE — Telephone Encounter (Signed)
 Pt scheduled for post neo diag MM/US  on 4/16 at [facility] Scheduled to see breast surgeon Dr. Dannielle Dux on 4/23

## 2024-02-16 ENCOUNTER — Encounter: Payer: Self-pay | Admitting: *Deleted

## 2024-02-20 ENCOUNTER — Other Ambulatory Visit: Payer: Self-pay | Admitting: Hematology and Oncology

## 2024-03-02 ENCOUNTER — Encounter: Payer: Self-pay | Admitting: *Deleted

## 2024-03-06 ENCOUNTER — Telehealth: Payer: Self-pay | Admitting: *Deleted

## 2024-03-06 DIAGNOSIS — C50912 Malignant neoplasm of unspecified site of left female breast: Secondary | ICD-10-CM

## 2024-03-06 NOTE — Telephone Encounter (Signed)
 Called pt after being informed was a no show for an appt to see Dr. Dannielle Dux at [facility]. Pt informed she wants to be seen by a surgeon in [place] and would like to have sx here. Referral sent to [facility] for pt to see surgeon. Notified nurse navigator Tori, pt decided to have sx in [place]. Informed pt she will receive a call from [facility] to be seen to discuss sx.

## 2024-03-08 ENCOUNTER — Encounter: Payer: Self-pay | Admitting: *Deleted

## 2024-03-20 ENCOUNTER — Other Ambulatory Visit: Payer: Self-pay | Admitting: General Surgery

## 2024-03-20 DIAGNOSIS — E1142 Type 2 diabetes mellitus with diabetic polyneuropathy: Secondary | ICD-10-CM

## 2024-03-20 DIAGNOSIS — R7309 Other abnormal glucose: Secondary | ICD-10-CM

## 2024-03-20 DIAGNOSIS — C50912 Malignant neoplasm of unspecified site of left female breast: Secondary | ICD-10-CM

## 2024-03-23 ENCOUNTER — Encounter: Payer: Self-pay | Admitting: *Deleted

## 2024-03-23 ENCOUNTER — Telehealth: Payer: Self-pay | Admitting: Plastic Surgery

## 2024-03-23 NOTE — Telephone Encounter (Signed)
 Called [facility] to see if we can r/s

## 2024-04-03 ENCOUNTER — Telehealth: Payer: Self-pay | Admitting: *Deleted

## 2024-04-03 ENCOUNTER — Encounter: Payer: Self-pay | Admitting: Plastic Surgery

## 2024-04-03 ENCOUNTER — Ambulatory Visit (INDEPENDENT_AMBULATORY_CARE_PROVIDER_SITE_OTHER): Admitting: Plastic Surgery

## 2024-04-03 VITALS — BP 154/84 | HR 105 | Ht 69.0 in | Wt >= 6400 oz

## 2024-04-03 DIAGNOSIS — C50912 Malignant neoplasm of unspecified site of left female breast: Secondary | ICD-10-CM | POA: Diagnosis not present

## 2024-04-03 DIAGNOSIS — C773 Secondary and unspecified malignant neoplasm of axilla and upper limb lymph nodes: Secondary | ICD-10-CM | POA: Diagnosis not present

## 2024-04-03 NOTE — Progress Notes (Signed)
 Patient ID: [name], female    DOB: 02-Mar-1976, 48 y.o.   MRN: [id number]   Chief Complaint  Patient presents with   Consult        Breast Problem   Breast Cancer    The patient is a 48 year old female here with her significant other for consultation for breast reconstruction.  The patient describes getting herself together 1 day when she felt a lump in the left axillary area.  She was able to get it worked up very quickly.  She was diagnosed with metastatic breast cancer to axillary lymph node of the left side.  She had a port put in and has started chemotherapy.  There has been a good response so far.  She is 5 feet 9 inches tall and weighs 405 pounds.  Over the past couple years she actually lost to 100 pounds.  She has extremely large and pendulous breasts.  She is planning on doing gastric bypass for weight reduction when she gets the breast cancer taken care of.  She is interested in reconstructive surgery.  Her past medical history includes sickle cell trait, hyperglycemia and diabetes, anxiety disorder and lumbar radiculopathy.  She recently lost her mom to colon cancer.    Review of Systems  Constitutional: Negative.   HENT: Negative.    Eyes: Negative.   Respiratory: Negative.    Cardiovascular: Negative.   Gastrointestinal: Negative.   Endocrine: Negative.   Genitourinary: Negative.   Musculoskeletal: Negative.     Past Medical History:  Diagnosis Date   Allergic rhinitis    Anemia    Anxiety disorder    Asthma    Benign essential hypertension    Depression    Diabetes mellitus without complication (HCC)    Edema    History of blood clots    Hyperglycemia    Lumbar radiculopathy    Metrorrhagia    Morbid obesity (HCC)    OSA (obstructive sleep apnea)    Sickle cell trait (HCC)    Snoring     Past Surgical History:  Procedure Laterality Date   CESAREAN SECTION     CHOLECYSTECTOMY     IR IMAGING GUIDED PORT INSERTION  08/08/2023   IR US  SOFT TISSUE  NECK  09/21/2023   TUBAL LIGATION        Current Outpatient Medications:    albuterol  (PROAIR  HFA) 108 (90 Base) MCG/ACT inhaler, Use 2 puffs every 4 hours as needed for cough or wheeze.  May use 2 puffs 10-20 minutes prior to exercise., Disp: 1 Inhaler, Rfl: 0   cetirizine  (ZYRTEC ) 10 MG tablet, Take 10 mg by mouth daily., Disp: , Rfl:    cyclobenzaprine (FLEXERIL) 10 MG tablet, Take 10 mg by mouth 3 (three) times daily., Disp: , Rfl:    diphenoxylate -atropine  (LOMOTIL ) 2.5-0.025 MG tablet, Take 1 tablet by mouth 4 (four) times daily as needed for diarrhea or loose stools., Disp: 30 tablet, Rfl: 3   ELIQUIS  5 MG TABS tablet, TAKE 1 TABLET BY MOUTH TWICE A DAY, Disp: 60 tablet, Rfl: 3   fluconazole  (DIFLUCAN ) 150 MG tablet, TAKE 1 TAB X 1 AND REPEAT AGAIN AFTER 5 DAYS, Disp: 2 tablet, Rfl: 1   gabapentin  (NEURONTIN ) 100 MG capsule, Take 1 capsule (100 mg total) by mouth 3 (three) times daily., Disp: 270 capsule, Rfl: 0   glipiZIDE (GLUCOTROL XL) 10 MG 24 hr tablet, Take 10 mg by mouth daily with breakfast., Disp: , Rfl:    ibuprofen (  ADVIL) 200 MG tablet, Take 800 mg by mouth every 6 (six) hours as needed., Disp: , Rfl:    lidocaine  (LIDODERM ) 5 %, Place 1 patch onto the skin daily. Remove & Discard patch within 12 hours or as directed by MD, Disp: 30 patch, Rfl: 0   LINZESS 72 MCG capsule, Take 72 mcg by mouth daily as needed., Disp: , Rfl:    losartan  (COZAAR ) 100 MG tablet, Take 1 tablet (100 mg total) by mouth daily., Disp: 30 tablet, Rfl: 3   magic mouthwash (lidocaine , diphenhydrAMINE , alum & mag hydroxide) suspension, Swish and swallow 5 mLs four times daily as needed for mouth pain., Disp: 360 mL, Rfl: 1   metoprolol  succinate (TOPROL  XL) 100 MG 24 hr tablet, Take 1 tablet (100 mg total) by mouth daily. Take with or immediately following a meal., Disp: 30 tablet, Rfl: 6   montelukast (SINGULAIR) 10 MG tablet, Take 10 mg by mouth at bedtime., Disp: , Rfl:    MOUNJARO 10 MG/0.5ML Pen,  Inject 10 mg into the skin once a week., Disp: , Rfl:    nitrofurantoin , macrocrystal-monohydrate, (MACROBID ) 100 MG capsule, Take 1 capsule (100 mg total) by mouth 2 (two) times daily., Disp: 14 capsule, Rfl: 0   omeprazole  (PRILOSEC) 40 MG capsule, TAKE 1 CAPSULE BY MOUTH TWICE A DAY, Disp: 180 capsule, Rfl: 1   Oxycodone  HCl 20 MG TABS, Take 1 tablet by mouth 4 (four) times daily as needed., Disp: , Rfl:    sulfamethoxazole -trimethoprim  (BACTRIM  DS) 800-160 MG tablet, Take 1 tablet by mouth 2 (two) times daily., Disp: 14 tablet, Rfl: 0   SUMAtriptan (IMITREX) 100 MG tablet, Take 100 mg by mouth as directed., Disp: , Rfl:    topiramate (TOPAMAX) 50 MG tablet, Take 50 mg by mouth at bedtime., Disp: , Rfl:    triamcinolone  (KENALOG ) 0.025 % ointment, Apply 1 Application topically 2 (two) times daily., Disp: 80 each, Rfl: 0 No current facility-administered medications for this visit.  Facility-Administered Medications Ordered in Other Visits:    heparin  lock flush 100 unit/mL, 500 Units, Intracatheter, Once, Gudena, Vinay, MD   sodium chloride  flush (NS) 0.9 % injection 10 mL, 10 mL, Intracatheter, Once, Cameron Cea, MD   Objective:   Vitals:   04/03/24 1535  BP: (!) 154/84  Pulse: (!) 105  SpO2: 96%    Physical Exam Vitals and nursing note reviewed.  Constitutional:      Appearance: Normal appearance.  Cardiovascular:     Rate and Rhythm: Normal rate.     Pulses: Normal pulses.  Pulmonary:     Effort: Pulmonary effort is normal.  Abdominal:     General: There is no distension.     Palpations: Abdomen is soft.     Tenderness: There is no abdominal tenderness.  Neurological:     Mental Status: She is alert and oriented to person, place, and time.  Psychiatric:        Mood and Affect: Mood normal.        Behavior: Behavior normal.        Thought Content: Thought content normal.        Judgment: Judgment normal.     Assessment & Plan:  Breast cancer metastasized to  axillary lymph node, left (HCC)  The options for reconstruction we explained to the patient / family for breast reconstruction.  There are two general categories of reconstruction.  We can reconstruction a breast with implants or use the patient's own tissue.  These were  further discussed as listed.  Breast reconstruction is an optional procedure and eligibility depends on the full spectrum of the health of the patient and any co-morbidities.  More than one surgery is often needed to complete the reconstruction process.  The process can take three to twelve months to complete.  The breasts will not be identical due to many factors such as rib differences, shoulder asymmetry and treatments such as radiation.  The goal is to get the breasts to look normal and symmetrical in clothes.  Scars are a part of surgery and may fade some in time but will always be present under clothes.  Surgery may be an option on the non-cancer breast to achieve more symmetry.  No matter which procedure is chosen there is always the risk of complications and even failure of the body to heal.  This could result in no breast.    The options for reconstruction include:  1. Placement of a tissue expander with Acellular dermal matrix. When the expander is the desired size surgery is performed to remove the expander and place an implant.  In some cases the implant can be placed without an expander.  2. Autologous reconstruction can include using a muscle or tissue from another area of the body to create a breast.  3. Combined procedures (ie. latissismus dorsi flap) can be done with an expander / implant placed under the muscle.   The risks, benefits, scars and recovery time were discussed for each of the above. Risks include bleeding, infection, hematoma, seroma, scarring, pain, wound healing complications, flap loss, fat necrosis, capsular contracture, need for implant removal, donor site complications, bulge, hernia, umbilical  necrosis, need for urgent reoperation, and need for dressing changes.   The procedure the patient selected / that was best for the patient, was then discussed in further detail.  Total time: 45 minutes. This includes time spent with the patient during the visit as well as time spent before and after the visit reviewing the chart, documenting the encounter, making phone calls and reviewing studies.   Autologous reconstruction is not a great option for the patient for several reasons, her BMI and her plans for weight reduction.  She is thinking about waiting to do the reconstruction until after she does the weight loss surgery.  Otherwise it would be either a flap closure or implant-based reconstruction.  We talked about under and above the muscle.  She is going to think it over for the next week and then we will plan to talk again.  I have discussed this case with Dr. Cherlynn Cornfield  Pictures were obtained of the patient and placed in the chart with the patient's or guardian's permission.   Lindaann Requena Rilei Kravitz, DO

## 2024-04-03 NOTE — Addendum Note (Signed)
 Addended by: Thornell Flirt on: 04/03/2024 04:07 PM   Modules accepted: Level of Service

## 2024-04-03 NOTE — Telephone Encounter (Signed)
Faxed order,demographics,insurance information, and recent office notes to [facility] for Mastectomy Supplies for the patient.  Confirmation received and copy scanned into the chart.//AB/CMA

## 2024-04-04 ENCOUNTER — Encounter: Payer: Self-pay | Admitting: Hematology and Oncology

## 2024-04-04 ENCOUNTER — Other Ambulatory Visit: Payer: Self-pay | Admitting: *Deleted

## 2024-04-04 DIAGNOSIS — D500 Iron deficiency anemia secondary to blood loss (chronic): Secondary | ICD-10-CM

## 2024-04-04 DIAGNOSIS — C50912 Malignant neoplasm of unspecified site of left female breast: Secondary | ICD-10-CM

## 2024-04-04 NOTE — Progress Notes (Signed)
 Received mychart message from pt requesting referral for genetics.  RN reviewed with MD and verbal orders received and placed.

## 2024-04-09 ENCOUNTER — Inpatient Hospital Stay

## 2024-04-09 ENCOUNTER — Inpatient Hospital Stay: Attending: Physician Assistant

## 2024-04-09 DIAGNOSIS — D509 Iron deficiency anemia, unspecified: Secondary | ICD-10-CM

## 2024-04-09 DIAGNOSIS — G620 Drug-induced polyneuropathy: Secondary | ICD-10-CM | POA: Diagnosis not present

## 2024-04-09 DIAGNOSIS — Z1721 Progesterone receptor positive status: Secondary | ICD-10-CM | POA: Diagnosis not present

## 2024-04-09 DIAGNOSIS — D500 Iron deficiency anemia secondary to blood loss (chronic): Secondary | ICD-10-CM

## 2024-04-09 DIAGNOSIS — C50912 Malignant neoplasm of unspecified site of left female breast: Secondary | ICD-10-CM | POA: Insufficient documentation

## 2024-04-09 DIAGNOSIS — C773 Secondary and unspecified malignant neoplasm of axilla and upper limb lymph nodes: Secondary | ICD-10-CM | POA: Insufficient documentation

## 2024-04-09 DIAGNOSIS — Z170 Estrogen receptor positive status [ER+]: Secondary | ICD-10-CM | POA: Insufficient documentation

## 2024-04-09 DIAGNOSIS — Z1732 Human epidermal growth factor receptor 2 negative status: Secondary | ICD-10-CM | POA: Diagnosis not present

## 2024-04-09 LAB — CBC WITH DIFFERENTIAL (CANCER CENTER ONLY)
Abs Immature Granulocytes: 0.01 10*3/uL (ref 0.00–0.07)
Basophils Absolute: 0 10*3/uL (ref 0.0–0.1)
Basophils Relative: 0 %
Eosinophils Absolute: 0 10*3/uL (ref 0.0–0.5)
Eosinophils Relative: 1 %
HCT: 33.4 % — ABNORMAL LOW (ref 36.0–46.0)
Hemoglobin: 10.4 g/dL — ABNORMAL LOW (ref 12.0–15.0)
Immature Granulocytes: 0 %
Lymphocytes Relative: 34 %
Lymphs Abs: 1.8 10*3/uL (ref 0.7–4.0)
MCH: 22.1 pg — ABNORMAL LOW (ref 26.0–34.0)
MCHC: 31.1 g/dL (ref 30.0–36.0)
MCV: 70.9 fL — ABNORMAL LOW (ref 80.0–100.0)
Monocytes Absolute: 0.5 10*3/uL (ref 0.1–1.0)
Monocytes Relative: 9 %
Neutro Abs: 2.9 10*3/uL (ref 1.7–7.7)
Neutrophils Relative %: 56 %
Platelet Count: 397 10*3/uL (ref 150–400)
RBC: 4.71 MIL/uL (ref 3.87–5.11)
RDW: 18 % — ABNORMAL HIGH (ref 11.5–15.5)
WBC Count: 5.2 10*3/uL (ref 4.0–10.5)
nRBC: 0 % (ref 0.0–0.2)

## 2024-04-09 LAB — FERRITIN: Ferritin: 23 ng/mL (ref 11–307)

## 2024-04-09 LAB — IRON AND IRON BINDING CAPACITY (CC-WL,HP ONLY)
Iron: 25 ug/dL — ABNORMAL LOW (ref 28–170)
Saturation Ratios: 6 % — ABNORMAL LOW (ref 10.4–31.8)
TIBC: 398 ug/dL (ref 250–450)
UIBC: 373 ug/dL (ref 148–442)

## 2024-04-09 LAB — CMP (CANCER CENTER ONLY)
ALT: 15 U/L (ref 0–44)
AST: 16 U/L (ref 15–41)
Albumin: 3.5 g/dL (ref 3.5–5.0)
Alkaline Phosphatase: 122 U/L (ref 38–126)
Anion gap: 6 (ref 5–15)
BUN: 11 mg/dL (ref 6–20)
CO2: 27 mmol/L (ref 22–32)
Calcium: 9 mg/dL (ref 8.9–10.3)
Chloride: 108 mmol/L (ref 98–111)
Creatinine: 0.99 mg/dL (ref 0.44–1.00)
GFR, Estimated: >60 mL/min (ref >60)
Glucose, Bld: 176 mg/dL — ABNORMAL HIGH (ref 70–99)
Potassium: 4.1 mmol/L (ref 3.5–5.1)
Sodium: 141 mmol/L (ref 135–145)
Total Bilirubin: 0.3 mg/dL (ref 0.0–1.2)
Total Protein: 6.8 g/dL (ref 6.5–8.1)

## 2024-04-09 MED ORDER — HEPARIN SOD (PORK) LOCK FLUSH 100 UNIT/ML IV SOLN
500.0000 [IU] | Freq: Once | INTRAVENOUS | Status: AC
  Administered 2024-04-09: 500 [IU]

## 2024-04-09 MED ORDER — SODIUM CHLORIDE 0.9% FLUSH
10.0000 mL | Freq: Once | INTRAVENOUS | Status: AC
  Administered 2024-04-09: 10 mL

## 2024-04-10 ENCOUNTER — Ambulatory Visit (INDEPENDENT_AMBULATORY_CARE_PROVIDER_SITE_OTHER): Admitting: Plastic Surgery

## 2024-04-10 ENCOUNTER — Institutional Professional Consult (permissible substitution): Admitting: Plastic Surgery

## 2024-04-10 ENCOUNTER — Encounter: Payer: Self-pay | Admitting: Plastic Surgery

## 2024-04-10 ENCOUNTER — Inpatient Hospital Stay (HOSPITAL_BASED_OUTPATIENT_CLINIC_OR_DEPARTMENT_OTHER): Admitting: Hematology and Oncology

## 2024-04-10 DIAGNOSIS — C773 Secondary and unspecified malignant neoplasm of axilla and upper limb lymph nodes: Secondary | ICD-10-CM

## 2024-04-10 DIAGNOSIS — C50912 Malignant neoplasm of unspecified site of left female breast: Secondary | ICD-10-CM | POA: Diagnosis not present

## 2024-04-10 MED ORDER — SULFAMETHOXAZOLE-TRIMETHOPRIM 800-160 MG PO TABS: 1.0000 | ORAL_TABLET | Freq: Two times a day (BID) | ORAL | 0 refills | Status: DC

## 2024-04-10 NOTE — Progress Notes (Signed)
   Subjective:    Patient ID: KIMYATA MILICH, female    DOB: Jun 26, 1976, 48 y.o.   MRN: [id number]  The patient is a 48 year old female being seen for breast reconstruction.  She was diagnosed with metastatic breast cancer to the axillary lymph node on the left side.  She had a port put in and started chemotherapy.  She is 5 feet 9 inches tall and weighs 405 pounds.  Over the past couple of years she has lost to 100 pounds.  When I saw her on June 3 she was thinking about doing some weight reduction surgery and therefore perhaps waiting to do the reconstruction. I was not able to reach her by phone      Review of Systems     Objective:   Physical Exam        Assessment & Plan:   Breast cancer metastasized to axillary lymph node, left (HCC)  Unable to reach patient.  Office will try to reschedule.

## 2024-04-10 NOTE — Progress Notes (Signed)
 HEMATOLOGY-ONCOLOGY TELEPHONE VISIT PROGRESS NOTE  I connected with our patient on 04/10/24 at  1:45 PM EDT by telephone and verified that I am speaking with the correct person using two identifiers.  I discussed the limitations, risks, security and privacy concerns of performing an evaluation and management service by telephone and the availability of in person appointments.  I also discussed with the patient that there may be a patient responsible charge related to this service. The patient expressed understanding and agreed to proceed.   History of Present Illness: Telephone follow-up to discuss surgical plans.  History of Present Illness [name] is a 48 year old who completed neoadjuvant chemotherapy and is awaiting surgical planning.  The post chemotherapy ultrasound revealed a significant shrinkage of the axillary lymph node.  She is healed healing and recovering very well from the chemotherapy treatments.  She met with Dr. Cherlynn Cornfield who suggested that she might benefit from bilateral mastectomies.  She is unclear if she wants to proceed and that direction.    Oncology History  Breast cancer metastasized to axillary lymph node, left (HCC)  07/12/2023 Initial Diagnosis   Patient palpated a lump in the left axilla.  Mammogram did not reveal any primary breast tumor.  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+   08/15/2023 - 01/17/2024 Chemotherapy   Patient is on Treatment Plan : BREAST ADJUVANT DOSE DENSE AC q14d / PACLitaxel  q7d       REVIEW OF SYSTEMS:   Constitutional: Denies fevers, chills or abnormal weight loss All other systems were reviewed with the patient and are negative. Observations/Objective:     Assessment Plan:  Breast cancer metastasized to axillary lymph node, left (HCC) 07/12/2023: Premenopausal patient palpated a lump in the left axilla. Mammogram did not reveal any primary breast tumor. Axilla revealed an abnormal lymph  node biopsy: IDC ER 100%, PR 100%, HER2 negative 1+ (Her surgeons are at [facility]) 07/26/2023: CT CAP: Enlarged left axillary lymph node of distant metastatic disease 07/27/2023: Bone scan: No evidence of skeletal metastasis   Treatment plan: Neoadjuvant chemotherapy with dose dense Adriamycin  and Cytoxan  x 4 followed by Taxol  weekly x 8 (stopped early because of desquamation in the feet and neuropathy) Breast conserving surgery with targeted node dissection (Dr. Cherlynn Cornfield) Adjuvant radiation therapy Adjuvant antiestrogen therapy with CDK inhibitors ------------------------------------------------------------------------------------------------------------------- Current treatment: Completed 4 cycles of dose dense Adriamycin  and Cytoxan , followed by 8 cycles of Taxol  (discontinuing chemo for peripheral neuropathy)  Mamm[facility] at [facility] 02/29/2024: Focus of breast cancer decrease from 2.6 cm to 1.1 cm Patient is for seeing Dr. Cherlynn Cornfield for surgery Return to clinic after surgery to discuss her treatment plan Discussion: We discussed the role of double mastectomy versus axillary lymph node surgery. Patient is not keen on proceeding with double mastectomy as was recommended and would rather do the axillary lymph node surgery and keeping close watch on her breast. I conveyed this to Dr. Cherlynn Cornfield and we will come up with a consensus decision and plan.    I discussed the assessment and treatment plan with the patient. The patient was provided an opportunity to ask questions and all were answered. The patient agreed with the plan and demonstrated an understanding of the instructions. The patient was advised to call back or seek an in-person evaluation if the symptoms worsen or if the condition fails to improve as anticipated.   I provided 20 minutes of non-face-to-face time during this encounter.  This includes time for charting  and coordination of care   Michelle Sheerer, MD

## 2024-04-10 NOTE — Assessment & Plan Note (Signed)
 07/12/2023: Premenopausal patient palpated a lump in the left axilla. Mammogram did not reveal any primary breast tumor. 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 07/27/2023: Bone scan: No evidence of skeletal metastasis   Treatment plan: Neoadjuvant chemotherapy with dose dense Adriamycin  and Cytoxan  x 4 followed by Taxol  weekly x 8 (stopped early because of desquamation in the feet and neuropathy) Breast conserving surgery with targeted node dissection (Dr. Cherlynn Cornfield) Adjuvant radiation therapy Adjuvant antiestrogen therapy with CDK inhibitors ------------------------------------------------------------------------------------------------------------------- Current treatment: Completed 4 cycles of dose dense Adriamycin  and Cytoxan , followed by 8 cycles of Taxol  (discontinuing chemo for peripheral neuropathy)  Mammogram at [facility] 02/29/2024: Focus of breast cancer decrease from 2.6 cm to 1.1 cm Patient is for seeing Dr. Cherlynn Cornfield for surgery Return to clinic after surgery to discuss her treatment plan

## 2024-04-13 ENCOUNTER — Other Ambulatory Visit: Payer: Self-pay | Admitting: General Surgery

## 2024-04-17 ENCOUNTER — Other Ambulatory Visit: Payer: Self-pay | Admitting: General Surgery

## 2024-04-17 DIAGNOSIS — C50912 Malignant neoplasm of unspecified site of left female breast: Secondary | ICD-10-CM

## 2024-04-20 NOTE — Progress Notes (Addendum)
 Phone call to patient, identity verified x 2.   Patient previously called wanting to change surgery back to a bilateral mastectomy.  Given the confusion, I called patient to discuss.  Reviewed again the pros and cons of mastectomy versus just axillary surgery.  Patient did not have contrast-enhanced imaging prior to chemotherapy.  Patient had diagnostic mammogram and ultrasound which did not show any in breast malignancy.  However, patient did not have a complete clinical response of her lymph node to chemotherapy.  Because of this, we are going to get contrast-enhanced mammogram post chemotherapy.  We will base our surgery on this imaging study.  Hopefully this will show either nothing or a small focus of cancer such that we could do a lumpectomy or strictly axillary targeted surgery.  Patient does have high surgical risk due to prior history of clot, diabetes, and weight.  Mastectomy especially with reconstruction would put patient at much higher risk for wound complications and systemic complications.  If it could be avoided, it would give the patient a much faster recovery.  Orders have been submitted as ASAP.  10 min visit

## 2024-04-23 ENCOUNTER — Telehealth: Payer: Self-pay

## 2024-04-23 NOTE — Telephone Encounter (Signed)
 Surgical clearance letter sent to Dr Aron at [facility] for pt to stop Eliquis  1 week prior to surgery and should not resume, nor a need for lovenox , per MD. Fax confirmation received.

## 2024-04-25 ENCOUNTER — Encounter: Payer: Self-pay | Admitting: *Deleted

## 2024-04-26 ENCOUNTER — Other Ambulatory Visit: Payer: Self-pay | Admitting: Adult Health

## 2024-04-27 ENCOUNTER — Encounter: Payer: Self-pay | Admitting: Hematology and Oncology

## 2024-04-30 NOTE — Pre-Procedure Instructions (Signed)
 Surgical Instructions   Your procedure is scheduled on Thursday, May 10, 2024. Report to [facility] Main Entrance A at 1:00 PM, then check in with the Admitting office. Any questions or running late day of surgery: call [phone]  Questions prior to your surgery date: call [phone], Monday-Friday, 8am-4pm. If you experience any cold or flu symptoms such as cough, fever, chills, shortness of breath, etc. between now and your scheduled surgery, please notify us  at the above number.     Remember:  Do not eat after midnight the night before your surgery   You may drink clear liquids until 12:00pm  the afternoon of your surgery.   Clear liquids allowed are: Water, Non-Citrus Juices (without pulp), Carbonated Beverages, Clear Tea (no milk, honey, etc.), Black Coffee Only (NO MILK, CREAM OR POWDERED CREAMER of any kind), and Gatorade.    Take these medicines the morning of surgery with A SIP OF WATER : Cetirizine  (Zyrtec ) Cyclobenzaprine (Flexeril) Gabapentin  (Neurontin ) Metoprolol  Succinate (Toprol  XL) Omeprazole  (Prilosec)   May take these medicines IF NEEDED: Albuterol  (ProAir  HFA) inhaler Diphenoxylate -Atropine  (Lomotil ) Oxycodone  Hcl Sumatriptan (Imitrex)  Per your Doctor, STOP Eliquis  1 week prior to surgery. Your last dose will be July 2.  One week prior to surgery, STOP taking any Aspirin (unless otherwise instructed by your surgeon) Aleve , Naproxen , Ibuprofen, Motrin, Advil, Goody's, BC's, all herbal medications, fish oil, and non-prescription vitamins.  WHAT DO I DO ABOUT MY DIABETES MEDICATION?   Do not take oral diabetes medicines (pills) the morning of surgery. DO NOT take Glipizide (Glucotrol XL) the morning of surgery.  STOP MOUNJARO 1 WEEK prior to surgery. Your last dose will be on or before July 2.   HOW TO MANAGE YOUR DIABETES BEFORE AND AFTER SURGERY  Why is it important to control my blood sugar before and after surgery? Improving blood sugar  levels before and after surgery helps healing and can limit problems. A way of improving blood sugar control is eating a healthy diet by:  Eating less sugar and carbohydrates  Increasing activity/exercise  Talking with your doctor about reaching your blood sugar goals High blood sugars (greater than 180 mg/dL) can raise your risk of infections and slow your recovery, so you will need to focus on controlling your diabetes during the weeks before surgery. Make sure that the doctor who takes care of your diabetes knows about your planned surgery including the date and location.  How do I manage my blood sugar before surgery? Check your blood sugar at least 4 times a day, starting 2 days before surgery, to make sure that the level is not too high or low.  Check your blood sugar the morning of your surgery when you wake up and every 2 hours until you get to the Short Stay unit.  If your blood sugar is less than 70 mg/dL, you will need to treat for low blood sugar: Do not take insulin. Treat a low blood sugar (less than 70 mg/dL) with  cup of clear juice (cranberry or apple), 4 glucose tablets, OR glucose gel. Recheck blood sugar in 15 minutes after treatment (to make sure it is greater than 70 mg/dL). If your blood sugar is not greater than 70 mg/dL on recheck, call [phone] for further instructions. Report your blood sugar to the short stay nurse when you get to Short Stay.  If you are admitted to the hospital after surgery: Your blood sugar will be checked by the staff and you will probably be given  insulin after surgery (instead of oral diabetes medicines) to make sure you have good blood sugar levels. The goal for blood sugar control after surgery is 80-180 mg/dL.                      Do NOT Smoke (Tobacco/Vaping) for 24 hours prior to your procedure.  If you use a CPAP at night, you may bring your mask/headgear for your overnight stay.   You will be asked to remove any contacts,  glasses, piercing's, hearing aid's, dentures/partials prior to surgery. Please bring cases for these items if needed.    Patients discharged the day of surgery will not be allowed to drive home, and someone needs to stay with them for 24 hours.  SURGICAL WAITING ROOM VISITATION Patients may have no more than 2 support people in the waiting area - these visitors may rotate.   Pre-op nurse will coordinate an appropriate time for 1 ADULT support person, who may not rotate, to accompany patient in pre-op.  Children under the age of 39 must have an adult with them who is not the patient and must remain in the main waiting area with an adult.  If the patient needs to stay at the hospital during part of their recovery, the visitor guidelines for inpatient rooms apply.  Please refer to the [facility] website for the visitor guidelines for any additional information.   If you received a COVID test during your pre-op visit  it is requested that you wear a mask when out in public, stay away from anyone that may not be feeling well and notify your surgeon if you develop symptoms. If you have been in contact with anyone that has tested positive in the last 10 days please notify you surgeon.      Pre-operative CHG Bathing Instructions   You can play a key role in reducing the risk of infection after surgery. Your skin needs to be as free of germs as possible. You can reduce the number of germs on your skin by washing with CHG (chlorhexidine gluconate) soap before surgery. CHG is an antiseptic soap that kills germs and continues to kill germs even after washing.   DO NOT use if you have an allergy to chlorhexidine/CHG or antibacterial soaps. If your skin becomes reddened or irritated, stop using the CHG and notify one of our RNs at [phone].              TAKE A SHOWER THE NIGHT BEFORE SURGERY AND THE DAY OF SURGERY    Please keep in mind the following:  DO NOT shave, including legs and underarms,  48 hours prior to surgery.   You may shave your face before/day of surgery.  Place clean sheets on your bed the night before surgery Use a clean washcloth (not used since being washed) for each shower. DO NOT sleep with pet's night before surgery.  CHG Shower Instructions:  Wash your face and private area with normal soap. If you choose to wash your hair, wash first with your normal shampoo.  After you use shampoo/soap, rinse your hair and body thoroughly to remove shampoo/soap residue.  Turn the water OFF and apply half the bottle of CHG soap to a CLEAN washcloth.  Apply CHG soap ONLY FROM YOUR NECK DOWN TO YOUR TOES (washing for 3-5 minutes)  DO NOT use CHG soap on face, private areas, open wounds, or sores.  Pay special attention to the area where your surgery  is being performed.  If you are having back surgery, having someone wash your back for you may be helpful. Wait 2 minutes after CHG soap is applied, then you may rinse off the CHG soap.  Pat dry with a clean towel  Put on clean pajamas    Additional instructions for the day of surgery: DO NOT APPLY any lotions, deodorants, cologne, or perfumes.   Do not wear jewelry or makeup Do not wear nail polish, gel polish, artificial nails, or any other type of covering on natural nails (fingers and toes) Do not bring valuables to the hospital. [facility] is not responsible for valuables/personal belongings. Put on clean/comfortable clothes.  Please brush your teeth.  Ask your nurse before applying any prescription medications to the skin.

## 2024-05-01 ENCOUNTER — Inpatient Hospital Stay (HOSPITAL_COMMUNITY)
Admission: RE | Admit: 2024-05-01 | Discharge: 2024-05-01 | Disposition: A | Discharge status: Home / Self Care | Source: Ambulatory Visit

## 2024-05-01 NOTE — Progress Notes (Incomplete)
 Pt is not here for her 10 am appt at PAT. Cannot be reached by phone.IVM is left for the pt.

## 2024-05-08 ENCOUNTER — Other Ambulatory Visit

## 2024-05-09 ENCOUNTER — Other Ambulatory Visit: Payer: Self-pay | Admitting: General Surgery

## 2024-05-09 ENCOUNTER — Encounter: Payer: Self-pay | Admitting: Adult Health

## 2024-05-09 DIAGNOSIS — Z853 Personal history of malignant neoplasm of breast: Secondary | ICD-10-CM

## 2024-05-10 ENCOUNTER — Inpatient Hospital Stay (HOSPITAL_COMMUNITY): Admission: RE | Admit: 2024-05-10 | Source: Ambulatory Visit

## 2024-05-10 ENCOUNTER — Encounter

## 2024-05-10 ENCOUNTER — Ambulatory Visit (HOSPITAL_COMMUNITY): Admit: 2024-05-10 | Admitting: General Surgery

## 2024-05-10 ENCOUNTER — Telehealth: Payer: Self-pay | Admitting: *Deleted

## 2024-05-10 ENCOUNTER — Inpatient Hospital Stay: Attending: Physician Assistant | Admitting: Licensed Clinical Social Worker

## 2024-05-10 SURGERY — BIOPSY, LYMPH NODE, SENTINEL, AXILLARY
Anesthesia: General | Laterality: Left

## 2024-05-10 NOTE — Progress Notes (Signed)
 [facility] Clinical Social Work  Initial Assessment   [name] is a 48 y.o. year old female contacted by phone. Clinical Social Work was referred by nurse for assessment of psychosocial needs.   SDOH (Social Determinants of Health) assessments performed: Yes SDOH Interventions    Flowsheet Row Clinical Support from 05/10/2024 in [facility] Dept Of [facility]  SDOH Interventions   Food Insecurity Interventions Community Resources Provided, Other (Comment)  [facility] food pantry information]  Housing Interventions Community Resources Provided  Utilities Interventions Community Resources Provided, Other (Comment)  [cancer grants]    SDOH Screenings   Food Insecurity: Food Insecurity Present (05/10/2024)  Housing: Unknown (05/10/2024)  Transportation Needs: No Transportation Needs (05/10/2024)  Utilities: At Risk (05/10/2024)  Social Connections: Unknown (06/07/2023)   Received from [facility]  Tobacco Use: Low Risk  (04/10/2024)     Distress Screen completed: No     No data to display            Family/Social Information:  Housing Arrangement: patient lives with her twin children (22yo). Daughter is pregnant, son is on disability. Has Section 8 housing Family members/support persons in your life? Family Transportation concerns: yes, costs of gas  Employment: Legally disabled.  Income source: Social Security Disability and son's disability Financial concerns: Yes, current concerns Type of concern: Utilities, Rent/ mortgage, Transportation, and [employer] access concerns: yes, receives [employer] ($68) but not enough to cover food costs Religious or spiritual practice: Not known Advanced directives: Not known Services Currently in place:  [employer], Medicaid, SNAP, Section 8  Coping/ Adjustment to diagnosis: Patient understands treatment plan and what happens next? Pt has completed chemo. She is supposed to have surgery then radiation Concerns  about diagnosis and/or treatment: affording daily living needs while undergoing treatment Patient reported stressors: Finances. Her mom was helping financially, but she died in 2024-09-25. Pt has been struggling to make ends meet since then Current coping skills/ strengths: Supportive family/friends  and Other: ability to access resources    SUMMARY: Current SDOH Barriers:  Financial constraints related to fixed, limited income  Clinical Social Work Clinical Goal(s):  Explore community resource options for unmet needs related to:  Financial Strain   Interventions: Informed patient of the support team roles and support services at [facility] Provided CSW contact information and encouraged patient to call with any questions or concerns Referred patient to [place] [place] for [employer] Provided information about local resources (food pantries, Health and safety inspector) Provided information on [employer] Purse   Follow Up Plan: Patient will send bill to CSW for [employer] application and CSW will submit application at that time Patient verbalizes understanding of plan: Yes    Michelle Buick E Lakiesha Ralphs, LCSW Clinical Social Worker [facility]

## 2024-05-10 NOTE — Telephone Encounter (Signed)
Faxed order,demographics,insurance information, and recent office notes to [facility] for Mastectomy Supplies for the patient.  Confirmation received and copy scanned into the chart.//AB/CMA

## 2024-05-14 ENCOUNTER — Encounter: Payer: Self-pay | Admitting: Hematology and Oncology

## 2024-05-14 NOTE — Progress Notes (Signed)
 Rcv'd referral from social worker Rosaline to contact pt regarding the [employer] so I called the pt and introduced myself as her Dance movement psychotherapist and informed her she has to be in active treatment to qualify for the [employer].  Pt has finished chemo and is awaiting surgery and then will start radiation.  I emailed her my contact information and requested she call me once her radiation starts.  She verbalized understanding and thanked me for calling.

## 2024-05-18 ENCOUNTER — Telehealth: Payer: Self-pay | Admitting: *Deleted

## 2024-05-18 ENCOUNTER — Encounter: Payer: Self-pay | Admitting: *Deleted

## 2024-05-18 NOTE — Telephone Encounter (Signed)
 Spoke with patient about her upcoming appt with Dr. Odean on 7/23 which was supposed to be a post op  appt but she has not had sx yet.  She was originally scheduled 7/10 but was cx. She is scheduled for CEM on 7/22 and hopefully sx will be r/s after that. Appt cx and pt. Aware.

## 2024-05-22 ENCOUNTER — Ambulatory Visit: Payer: Self-pay | Admitting: General Surgery

## 2024-05-22 ENCOUNTER — Ambulatory Visit
Admission: RE | Admit: 2024-05-22 | Discharge: 2024-05-22 | Disposition: A | Discharge status: Home / Self Care | Source: Ambulatory Visit | Attending: General Surgery | Admitting: General Surgery

## 2024-05-22 DIAGNOSIS — Z853 Personal history of malignant neoplasm of breast: Secondary | ICD-10-CM

## 2024-05-22 MED ORDER — IOPAMIDOL (ISOVUE-370) INJECTION 76%
100.0000 mL | Freq: Once | INTRAVENOUS | Status: AC | PRN
  Administered 2024-05-22: 100 mL via INTRAVENOUS

## 2024-05-23 ENCOUNTER — Other Ambulatory Visit: Payer: Self-pay | Admitting: General Surgery

## 2024-05-23 ENCOUNTER — Inpatient Hospital Stay: Attending: Physician Assistant | Admitting: Hematology and Oncology

## 2024-05-23 DIAGNOSIS — N6320 Unspecified lump in the left breast, unspecified quadrant: Secondary | ICD-10-CM

## 2024-05-24 ENCOUNTER — Other Ambulatory Visit: Payer: Self-pay | Admitting: General Surgery

## 2024-05-24 DIAGNOSIS — N6320 Unspecified lump in the left breast, unspecified quadrant: Secondary | ICD-10-CM

## 2024-05-24 DIAGNOSIS — N6489 Other specified disorders of breast: Secondary | ICD-10-CM

## 2024-05-25 ENCOUNTER — Encounter: Payer: Self-pay | Admitting: *Deleted

## 2024-05-28 ENCOUNTER — Ambulatory Visit
Admission: RE | Admit: 2024-05-28 | Discharge: 2024-05-28 | Disposition: A | Discharge status: Home / Self Care | Source: Ambulatory Visit | Attending: General Surgery | Admitting: General Surgery

## 2024-05-28 ENCOUNTER — Other Ambulatory Visit: Payer: Self-pay | Admitting: General Surgery

## 2024-05-28 DIAGNOSIS — N6489 Other specified disorders of breast: Secondary | ICD-10-CM

## 2024-05-28 DIAGNOSIS — N6320 Unspecified lump in the left breast, unspecified quadrant: Secondary | ICD-10-CM

## 2024-05-28 HISTORY — PX: BREAST BIOPSY: SHX20

## 2024-05-29 ENCOUNTER — Encounter: Payer: Self-pay | Admitting: *Deleted

## 2024-05-29 LAB — SURGICAL PATHOLOGY

## 2024-05-31 ENCOUNTER — Encounter

## 2024-06-04 ENCOUNTER — Encounter: Payer: Self-pay | Admitting: *Deleted

## 2024-06-04 DIAGNOSIS — C50912 Malignant neoplasm of unspecified site of left female breast: Secondary | ICD-10-CM

## 2024-06-05 ENCOUNTER — Inpatient Hospital Stay

## 2024-06-05 ENCOUNTER — Inpatient Hospital Stay: Attending: Physician Assistant | Admitting: Genetic Counselor

## 2024-06-05 ENCOUNTER — Telehealth: Payer: Self-pay | Admitting: Genetic Counselor

## 2024-06-05 NOTE — Telephone Encounter (Signed)
 Rescheduled appointments per patients request via incoming call from the patient. Talked with the patient and she is aware of the changes made to her upcoming appointments.

## 2024-06-06 ENCOUNTER — Encounter

## 2024-06-08 NOTE — Progress Notes (Signed)
 Surgical Instructions   Your procedure is scheduled on Thursday, August 14th, 2025. Report to [facility] Main Entrance A at 9:15 A.M., then check in with the Admitting office. Any questions or running late day of surgery: call [phone]  Questions prior to your surgery date: call [phone], Monday-Friday, 8am-4pm. If you experience any cold or flu symptoms such as cough, fever, chills, shortness of breath, etc. between now and your scheduled surgery, please notify us  at the above number.     Remember:  Do not eat after midnight the night before your surgery   You may drink clear liquids until 8:15 the morning of your surgery.   Clear liquids allowed are: Water, Non-Citrus Juices (without pulp), Carbonated Beverages, Clear Tea (no milk, honey, etc.), Black Coffee Only (NO MILK, CREAM OR POWDERED CREAMER of any kind), and Gatorade.    Take these medicines the morning of surgery with A SIP OF WATER: Escitalopram  (Lexapro ) Metoprolol  Succinate (Toprol -XL)   May take these medicines IF NEEDED: Albuterol  (Proair  HFA) Inhaler - bring with you on the day of surgery Cetirizine  (Zyrtec ) Cyclobenzaprine (Flexeril) Gabapentin  (Neurontin ) Montelukast (Singulair) Oxycodone  Symbicort Inhaler - bring with you on the day of surgery    One week prior to surgery, STOP taking any Aspirin (unless otherwise instructed by your surgeon) Aleve , Naproxen , Ibuprofen, Motrin, Advil, Goody's, BC's, all herbal medications, fish oil, and non-prescription vitamins.   WHAT DO I DO ABOUT MY DIABETES MEDICATION?   Mounjaro needs to be stopped 7 days prior to your surgery.  Your last dose should be on or before Wednesday, August 6th.    Do NOT take Glipizide (Glucotrol) the night before your surgery or the morning of your surgery.          HOW TO MANAGE YOUR DIABETES BEFORE AND AFTER SURGERY  Why is it important to control my blood sugar before and after surgery? Improving blood sugar levels  before and after surgery helps healing and can limit problems. A way of improving blood sugar control is eating a healthy diet by:  Eating less sugar and carbohydrates  Increasing activity/exercise  Talking with your doctor about reaching your blood sugar goals High blood sugars (greater than 180 mg/dL) can raise your risk of infections and slow your recovery, so you will need to focus on controlling your diabetes during the weeks before surgery. Make sure that the doctor who takes care of your diabetes knows about your planned surgery including the date and location.  How do I manage my blood sugar before surgery? Check your blood sugar at least 4 times a day, starting 2 days before surgery, to make sure that the level is not too high or low.  Check your blood sugar the morning of your surgery when you wake up and every 2 hours until you get to the Short Stay unit.  If your blood sugar is less than 70 mg/dL, you will need to treat for low blood sugar: Do not take insulin. Treat a low blood sugar (less than 70 mg/dL) with  cup of clear juice (cranberry or apple), 4 glucose tablets, OR glucose gel. Recheck blood sugar in 15 minutes after treatment (to make sure it is greater than 70 mg/dL). If your blood sugar is not greater than 70 mg/dL on recheck, call [phone] for further instructions. Report your blood sugar to the short stay nurse when you get to Short Stay.  If you are admitted to the hospital after surgery: Your blood sugar will be checked  by the staff and you will probably be given insulin after surgery (instead of oral diabetes medicines) to make sure you have good blood sugar levels. The goal for blood sugar control after surgery is 80-180 mg/dL.                      Do NOT Smoke (Tobacco/Vaping) for 24 hours prior to your procedure.  If you use a CPAP at night, you may bring your mask/headgear for your overnight stay.   You will be asked to remove any contacts, glasses,  piercing's, hearing aid's, dentures/partials prior to surgery. Please bring cases for these items if needed.    Patients discharged the day of surgery will not be allowed to drive home, and someone needs to stay with them for 24 hours.  SURGICAL WAITING ROOM VISITATION Patients may have no more than 2 support people in the waiting area - these visitors may rotate.   Pre-op nurse will coordinate an appropriate time for 1 ADULT support person, who may not rotate, to accompany patient in pre-op.  Children under the age of 71 must have an adult with them who is not the patient and must remain in the main waiting area with an adult.  If the patient needs to stay at the hospital during part of their recovery, the visitor guidelines for inpatient rooms apply.  Please refer to the [facility] website for the visitor guidelines for any additional information.   If you received a COVID test during your pre-op visit  it is requested that you wear a mask when out in public, stay away from anyone that may not be feeling well and notify your surgeon if you develop symptoms. If you have been in contact with anyone that has tested positive in the last 10 days please notify you surgeon.      Pre-operative CHG Bathing Instructions   You can play a key role in reducing the risk of infection after surgery. Your skin needs to be as free of germs as possible. You can reduce the number of germs on your skin by washing with CHG (chlorhexidine gluconate) soap before surgery. CHG is an antiseptic soap that kills germs and continues to kill germs even after washing.   DO NOT use if you have an allergy to chlorhexidine/CHG or antibacterial soaps. If your skin becomes reddened or irritated, stop using the CHG and notify one of our RNs at [phone].              TAKE A SHOWER THE NIGHT BEFORE SURGERY AND THE DAY OF SURGERY    Please keep in mind the following:  DO NOT shave, including legs and underarms, 48 hours  prior to surgery.   You may shave your face before/day of surgery.  Place clean sheets on your bed the night before surgery Use a clean washcloth (not used since being washed) for each shower. DO NOT sleep with pet's night before surgery.  CHG Shower Instructions:  Wash your face and private area with normal soap. If you choose to wash your hair, wash first with your normal shampoo.  After you use shampoo/soap, rinse your hair and body thoroughly to remove shampoo/soap residue.  Turn the water OFF and apply half the bottle of CHG soap to a CLEAN washcloth.  Apply CHG soap ONLY FROM YOUR NECK DOWN TO YOUR TOES (washing for 3-5 minutes)  DO NOT use CHG soap on face, private areas, open wounds, or sores.  Pay special attention to the area where your surgery is being performed.  If you are having back surgery, having someone wash your back for you may be helpful. Wait 2 minutes after CHG soap is applied, then you may rinse off the CHG soap.  Pat dry with a clean towel  Put on clean pajamas    Additional instructions for the day of surgery: DO NOT APPLY any lotions, deodorants, cologne, or perfumes.   Do not wear jewelry or makeup Do not wear nail polish, gel polish, artificial nails, or any other type of covering on natural nails (fingers and toes) Do not bring valuables to the hospital. [facility] is not responsible for valuables/personal belongings. Put on clean/comfortable clothes.  Please brush your teeth.  Ask your nurse before applying any prescription medications to the skin.

## 2024-06-11 ENCOUNTER — Ambulatory Visit
Admission: RE | Admit: 2024-06-11 | Discharge: 2024-06-11 | Disposition: A | Discharge status: Home / Self Care | Source: Ambulatory Visit | Attending: General Surgery | Admitting: General Surgery

## 2024-06-11 ENCOUNTER — Inpatient Hospital Stay (HOSPITAL_COMMUNITY)
Admission: RE | Admit: 2024-06-11 | Discharge: 2024-06-11 | Disposition: A | Discharge status: Home / Self Care | Source: Ambulatory Visit

## 2024-06-11 ENCOUNTER — Other Ambulatory Visit: Payer: Self-pay | Admitting: General Surgery

## 2024-06-11 DIAGNOSIS — C50912 Malignant neoplasm of unspecified site of left female breast: Secondary | ICD-10-CM

## 2024-06-11 HISTORY — PX: BREAST BIOPSY: SHX20

## 2024-06-11 NOTE — Progress Notes (Signed)
 This RN contacted patient as she had not arrived for her PAT appointment. Patient stated she was just leaving the center from receiving a biopsy and that she was unable to come for her appointment.  RN contacted Dr. Dia office and stated they would see if the patient could be here at 0800 on 06-12-24 for a new PAT appointment in order to take her pregnancy test.  Awaiting call back from Sari RN at Dr. Dia office.

## 2024-06-11 NOTE — Progress Notes (Signed)
 This RN spoke with Dr. Dia office and the patient has agreed to come in on 06/12/24 @ 0800 for her PAT appointment

## 2024-06-12 ENCOUNTER — Inpatient Hospital Stay: Admission: RE | Admit: 2024-06-12 | Source: Ambulatory Visit

## 2024-06-12 ENCOUNTER — Other Ambulatory Visit: Payer: Self-pay | Admitting: General Surgery

## 2024-06-12 ENCOUNTER — Inpatient Hospital Stay (HOSPITAL_COMMUNITY)
Admission: RE | Admit: 2024-06-12 | Discharge: 2024-06-12 | Disposition: A | Discharge status: Home / Self Care | Source: Ambulatory Visit

## 2024-06-12 ENCOUNTER — Encounter

## 2024-06-12 ENCOUNTER — Encounter: Payer: Self-pay | Admitting: *Deleted

## 2024-06-12 DIAGNOSIS — C50912 Malignant neoplasm of unspecified site of left female breast: Secondary | ICD-10-CM

## 2024-06-12 LAB — SURGICAL PATHOLOGY

## 2024-06-12 NOTE — Progress Notes (Signed)
 This RN spoke with staff member Sari at Dr. Dia office and informed that the patient did not show up for her 8 am appointment.

## 2024-06-12 NOTE — Progress Notes (Signed)
 RN attempted to contact the patient however the phone call goes to voicemail which is not accepting messages at this time.

## 2024-06-13 ENCOUNTER — Inpatient Hospital Stay
Admission: RE | Admit: 2024-06-13 | Discharge: 2024-06-13 | Disposition: A | Discharge status: Home / Self Care | Payer: Self-pay | Source: Ambulatory Visit | Attending: Radiation Oncology | Admitting: Radiation Oncology

## 2024-06-13 ENCOUNTER — Telehealth: Payer: Self-pay | Admitting: Radiation Oncology

## 2024-06-13 ENCOUNTER — Other Ambulatory Visit: Payer: Self-pay | Admitting: Radiation Oncology

## 2024-06-13 DIAGNOSIS — C50912 Malignant neoplasm of unspecified site of left female breast: Secondary | ICD-10-CM

## 2024-06-13 NOTE — Telephone Encounter (Signed)
 Left message for patient to call back to schedule consult per 8/4 referral.

## 2024-06-14 ENCOUNTER — Encounter

## 2024-06-14 ENCOUNTER — Ambulatory Visit (HOSPITAL_COMMUNITY)

## 2024-06-15 ENCOUNTER — Other Ambulatory Visit: Payer: Self-pay | Admitting: General Surgery

## 2024-06-15 ENCOUNTER — Telehealth: Payer: Self-pay | Admitting: Genetic Counselor

## 2024-06-15 DIAGNOSIS — C50912 Malignant neoplasm of unspecified site of left female breast: Secondary | ICD-10-CM

## 2024-06-15 DIAGNOSIS — N6092 Unspecified benign mammary dysplasia of left breast: Secondary | ICD-10-CM

## 2024-06-15 NOTE — Telephone Encounter (Signed)
 I returned [name]'s phone call regarding her genetic counseling appointment scheduled for 06/26/24. She would like to cancel the apt because it is scheduled the day of her surgery. She said genetic test results would not impact her upcoming surgical decision so we do not need to get her in STAT. She would like to schedule genetics around her future radiation apts. She said she will call us  when she's beginning radiation/ready to get scheduled.   Michelle Holloran, MS, [facility] Genetic Counselor [email or web address] (P) [phone]

## 2024-06-19 ENCOUNTER — Encounter: Payer: Self-pay | Admitting: Radiation Oncology

## 2024-06-19 ENCOUNTER — Ambulatory Visit
Admission: RE | Admit: 2024-06-19 | Discharge: 2024-06-19 | Disposition: A | Discharge status: Home / Self Care | Source: Ambulatory Visit | Attending: Radiation Oncology | Admitting: Radiation Oncology

## 2024-06-19 ENCOUNTER — Other Ambulatory Visit

## 2024-06-19 DIAGNOSIS — C50912 Malignant neoplasm of unspecified site of left female breast: Secondary | ICD-10-CM

## 2024-06-19 NOTE — Progress Notes (Cosign Needed Addendum)
 Radiation Oncology         [phone] ________________________________  Initial Outpatient Consultation - Conducted via telephone at patient request.  I spoke with the patient to conduct this consult visit via telephone. The patient was notified in advance and was offered an in person or telemedicine meeting to allow for face to face communication but instead preferred to proceed with a telephone consult.  ________________________________  Name: [name]        MRN: [id number]  Date of Service: 06/19/2024 DOB: 04/29/1976  RR:Azmfjw, Ubaldo, MD  Odean Potts, MD     REFERRING PHYSICIAN: Odean Potts, MD   DIAGNOSIS: The encounter diagnosis was Breast cancer metastasized to axillary lymph node, left (HCC).   HISTORY OF PRESENT ILLNESS: [name] is a 48 y.o. female  who was planning to pursue weight loss surgery in the [facility] but during her work up a screening mammogram on 06/21/23 showed a mass in the left axilla.  An ultrasound of the breast and axilla on 07/01/23 showed an enrlaged 2.7 cm lymph node in the left axilla, and no abnormalities were seen in the breast, but an MRI was recommended. It is unclear why an MRI was not performed. A biopsy of the lymph node mass on 07/12/23 showed metastatic carcinoma consistent with breast primary and iHC stains confirmed a ductal phenotype. Her tumor was ER positive PR positive HER2 negative.  Ki-67 is not utilized at that facility.  She had a CT chest abdomen pelvis on 07/26/2023 in the [facility] health system for staging which showed the known left axillary lymph node measuring 17 mm in short axis, no other focal findings of adenopathy or other evidence of distant disease was appreciated, no visible abnormalities were reported in the breast.  A bone scan that day was also negative for metastatic disease.  She was counseled on the rationale for neoadjuvant chemotherapy and underwent Port-A-Cath placement in October 2024 and began systemic  treatment on 08/15/2023.  She was to undergo a contrast-enhanced mammogram versus an MRI of the breasts to further workup her breast but it does not appear to have been performed.  She completed 4 cycles of Cytoxan  and Adriamycin  chemotherapy on 09/06/2023 and continued with weekly Taxol  until 01/17/2024.  She did have a mammogram at [facility] on 02/29/2024 that showed a decrease in the known lymph node in the left axilla previously measuring approximately 2.6 cm and at that point measured 1.1 cm.  There were no specific abnormal findings in either breast.  She went for a contrast-enhanced mammogram on 05/22/2024 in the [facility] system which showed a small group of calcifications with associated asymmetry in the anterior lateral aspect of the left breast which spanned approximately 1 cm in greatest dimension, there was no enhancement in either breast and she underwent a stereotactic biopsy of the calcifications on 05/28/2024 which showed benign breast tissue with no histologic correlate microcalcifications she returned for a second biopsy on 06/11/2024 and pathology showed focal atypical ductal hyperplasia measuring 1 mm in greatest dimension with fibroadenoma and calcifications in a background of breast tissue with fibrocystic change.  This was felt to be concordant with Dr. Lane review.  Given this finding Dr. Aron has offered the patient a lumpectomy to resect the atypical ductal hyperplasia at the same time as a targeted node biopsy.  She is contacted by phone today to discuss the role of radiotherapy.    PREVIOUS RADIATION THERAPY: No   PAST MEDICAL HISTORY:  Past Medical History:  Diagnosis Date   Allergic rhinitis    Anemia    Anxiety disorder    Asthma    Benign essential hypertension    Depression    Diabetes mellitus without complication (HCC)    Edema    History of blood clots    Hyperglycemia    Lumbar radiculopathy    Metrorrhagia    Morbid obesity (HCC)    OSA (obstructive  sleep apnea)    Sickle cell trait (HCC)    Snoring        PAST SURGICAL HISTORY: Past Surgical History:  Procedure Laterality Date   BREAST BIOPSY Left 05/28/2024   MM LT BREAST BX W LOC DEV 1ST LESION IMAGE BX SPEC STEREO GUIDE 05/28/2024 [facility]   BREAST BIOPSY Left 06/11/2024   MM LT BREAST BX W LOC DEV 1ST LESION IMAGE BX SPEC STEREO GUIDE 06/11/2024 [facility]   CESAREAN SECTION     CHOLECYSTECTOMY     IR IMAGING GUIDED PORT INSERTION  08/08/2023   IR US  SOFT TISSUE NECK  09/21/2023   TUBAL LIGATION       FAMILY HISTORY:  Family History  Problem Relation Age of Onset   Asthma Mother    Hypertension Mother    Leukemia Father    Diabetes Father    Breast cancer Maternal Aunt    Breast cancer Paternal Aunt    Breast cancer Cousin    Diabetes Brother      SOCIAL HISTORY:  reports that she has never smoked. She has never used smokeless tobacco. She reports that she does not currently use alcohol. She reports that she does not use drugs.  The patient is single and lives in [place].  She has been on disability for several years because of her spinal stenosis.   ALLERGIES: Doxycycline, Fish allergy, Fish oil, Other, Pollen extract, Citalopram, Amoxicillin , Penicillins, and Strawberry extract   MEDICATIONS:  Current Outpatient Medications  Medication Sig Dispense Refill   albuterol  (PROAIR  HFA) 108 (90 Base) MCG/ACT inhaler Use 2 puffs every 4 hours as needed for cough or wheeze.  May use 2 puffs 10-20 minutes prior to exercise. 1 Inhaler 0   aspirin EC 325 MG tablet Take 325 mg by mouth every 4 (four) hours as needed for mild pain (pain score 1-3) or moderate pain (pain score 4-6).     cetirizine  (ZYRTEC ) 10 MG tablet Take 10 mg by mouth daily as needed for allergies.     cyclobenzaprine (FLEXERIL) 10 MG tablet Take 10 mg by mouth 3 (three) times daily as needed for muscle spasms.     escitalopram  (LEXAPRO ) 20 MG tablet Take 20 mg by mouth in the morning.      gabapentin  (NEURONTIN ) 300 MG capsule Take 300 mg by mouth 3 (three) times daily as needed (pain.).     glipiZIDE (GLUCOTROL) 10 MG tablet Take 10 mg by mouth 2 (two) times daily as needed (blood glucose greater than 200.).     ibuprofen (ADVIL) 200 MG tablet Take 600-800 mg by mouth every 8 (eight) hours as needed (pain.).     losartan  (COZAAR ) 100 MG tablet Take 1 tablet (100 mg total) by mouth daily. 30 tablet 3   metoprolol  succinate (TOPROL  XL) 100 MG 24 hr tablet Take 1 tablet (100 mg total) by mouth daily. Take with or immediately following a meal. 30 tablet 6   montelukast (SINGULAIR) 10 MG tablet Take 10 mg by mouth daily as needed (allergies.).  MOUNJARO 12.5 MG/0.5ML Pen Inject 12.5 mg into the skin every Thursday.     Oxycodone  HCl 20 MG TABS Take 1 tablet by mouth 4 (four) times daily as needed (pain.).     SYMBICORT 80-4.5 MCG/ACT inhaler Inhale 2 puffs into the lungs 2 (two) times daily as needed (respiratory issues.).     topiramate (TOPAMAX) 50 MG tablet Take 50 mg by mouth at bedtime.     No current facility-administered medications for this encounter.   Facility-Administered Medications Ordered in Other Encounters  Medication Dose Route Frequency Provider Last Rate Last Admin   heparin  lock flush 100 unit/mL  500 Units Intracatheter Once Odean Potts, MD       sodium chloride  flush (NS) 0.9 % injection 10 mL  10 mL Intracatheter Once Odean Potts, MD         REVIEW OF SYSTEMS: On review of systems, the patient reports that she is doing much better since finishing chemotherapy.  She is getting some of her energy back, and she has been very happy that she has been able to keep off the 100 pounds that she is lost on her own.  She does plan to revisit options in the future with bariatric surgery as well.  She acknowledges baseline low back pain from spinal stenosis, and has been disabled due to this.  No other complaints are verbalized.     PHYSICAL EXAM:  Unable to  assess given encounter type.  The patient verbalizes that she is between 386 and 300 96 pounds    ECOG = 1  0 - Asymptomatic (Fully active, able to carry on all predisease activities without restriction)  1 - Symptomatic but completely ambulatory (Restricted in physically strenuous activity but ambulatory and able to carry out work of a light or sedentary nature. For example, light housework, office work)  2 - Symptomatic, <50% in bed during the day (Ambulatory and capable of all self care but unable to carry out any work activities. Up and about more than 50% of waking hours)  3 - Symptomatic, >50% in bed, but not bedbound (Capable of only limited self-care, confined to bed or chair 50% or more of waking hours)  4 - Bedbound (Completely disabled. Cannot carry on any self-care. Totally confined to bed or chair)  5 - Death   Raylene MM, Creech RH, Tormey DC, et al. [phone]). Toxicity and response criteria of the [facility]. Am. DOROTHA Bridges. Oncol. 5 (6): 649-55    LABORATORY DATA:  Lab Results  Component Value Date   WBC 5.2 04/09/2024   HGB 10.4 (L) 04/09/2024   HCT 33.4 (L) 04/09/2024   MCV 70.9 (L) 04/09/2024   PLT 397 04/09/2024   Lab Results  Component Value Date   NA 141 04/09/2024   K 4.1 04/09/2024   CL 108 04/09/2024   CO2 27 04/09/2024   Lab Results  Component Value Date   ALT 15 04/09/2024   AST 16 04/09/2024   ALKPHOS 122 04/09/2024   BILITOT 0.3 04/09/2024      RADIOGRAPHY: MM LT BREAST BX W LOC DEV 1ST LESION IMAGE BX SPEC STEREO GUIDE Addendum Date: 06/15/2024 ADDENDUM REPORT: 06/15/2024 15:57 ADDENDUM: Pathology revealed FOCAL ATYPICAL DUCTAL HYPERPLASIA, FIBROADENOMA WITH CALCIFICATIONS, BACKGROUND BREAST TISSUE WITH FIBROCYSTIC CHANGES of the LEFT breast, outer, (ribbon clip). This was found to be concordant by Dr. Inocente Ast, with excision recommended as there is concern for upgrade at excision. Pathology results were discussed  with the patient  by telephone. The patient reported doing well after the biopsy with mild tenderness at the site. Post biopsy instructions and care were reviewed and questions were answered. The patient was encouraged to call [facility] for any additional concerns. My direct phone number was provided. The patient has a recent diagnosis of metastatic LEFT axillary lymph node cancer consistent with a breast primary and should follow her outlined treatment plan. Biopsy results were sent to Dr. Jina Nephew via secure EPIC message on June 12, 2024 and June 15, 2024. NOTE: The case was amended after additional work-ups were performed and the modified diagnosis with additional information is rendered. Due to the scanty nature of the foci, we think it is best classified as atypical ductal hyperplasia (0.1 mm) for now. However, complete excision of the lesion sooner than later is recommended to exclude an unsampled worse pathology. This was discussed with the patient on June 15, 2024. Pathology results reported by Hendricks Benders, RN on 06/15/2024. Electronically Signed   By: Inocente Ast M.D.   On: 06/15/2024 15:57   Result Date: 06/15/2024 CLINICAL DATA:  48 year old female presenting for biopsy of calcifications with asymmetry in the outer left breast. EXAM: LEFT BREAST STEREOTACTIC CORE NEEDLE BIOPSY COMPARISON:  Previous exam(s). FINDINGS: The patient and I discussed the procedure of stereotactic-guided biopsy including benefits and alternatives. We discussed the high likelihood of a successful procedure. We discussed the risks of the procedure including infection, bleeding, tissue injury, clip migration, and inadequate sampling. Informed written consent was given. The usual time out protocol was performed immediately prior to the procedure. Using sterile technique and 1% Lidocaine  as local anesthetic, under stereotactic guidance, a 9 gauge vacuum assisted device was used to  perform core needle biopsy of calcifications with asymmetry in the outer left breast using a superior approach. Specimen radiograph was performed showing at least 2 specimens with calcifications. Specimens with calcifications are identified for pathology. Lesion quadrant: Lower outer quadrant At the conclusion of the procedure, ribbon shaped tissue marker clip was deployed into the biopsy cavity. Follow-up 2-view mammogram was performed and dictated separately. IMPRESSION: Stereotactic-guided biopsy of calcifications with asymmetry in the outer left breast. No apparent complications. Electronically Signed: By: Inocente Ast M.D. On: 06/11/2024 13:37   MM CLIP PLACEMENT LEFT Result Date: 06/11/2024 CLINICAL DATA:  Post procedure mammogram for clip placement EXAM: 3D DIAGNOSTIC LEFT MAMMOGRAM POST STEREOTACTIC BIOPSY COMPARISON:  Previous exam(s). ACR Breast Density Category a: The breasts are almost entirely fatty. FINDINGS: 3D Mammographic images were obtained following stereotactic guided biopsy of calcifications in the outer left breast. The ribbon biopsy marking clip is in expected position at the site of biopsy. IMPRESSION: Appropriate positioning of the ribbon shaped biopsy marking clip at the site of biopsy in the outer left breast. Final Assessment: Post Procedure Mammograms for Marker Placement Electronically Signed   By: Inocente Ast M.D.   On: 06/11/2024 13:35   MM LT BREAST BX W LOC DEV 1ST LESION IMAGE BX SPEC STEREO GUIDE Addendum Date: 05/29/2024 ADDENDUM REPORT: 05/29/2024 12:49 ADDENDUM: Pathology revealed BENIGN BREAST TISSUE WITH NO HISTOLOGIC CORRELATE FOR A CLINICAL MASS, NO MALIGNANCY IDENTIFIED, NO MICROCALCIFICATIONS IDENTIFIED of the LEFT breast, medial, (coil clip). This was found to be concordant by Dr. Dina Arceo. Pathology results were discussed with the patient by telephone. The patient reported doing well after the biopsy with tenderness at the site. Post biopsy  instructions and care were reviewed and questions were answered. The patient was encouraged  to call [facility] for any additional concerns. My direct phone number was provided. The patient is scheduled for a LEFT breast stereotactic guided biopsy on May 31, 2024. Further recommendations will be guided by the results of this biopsy. Pathology results reported by Hendricks Benders, RN on 05/29/2024. Electronically Signed   By: Dina  Arceo M.D.   On: 05/29/2024 12:49   Result Date: 05/29/2024 CLINICAL DATA:  48 year old female with history of metastatic left axillary lymph node, biopsied on 07/12/2023. Pathology was consistent with a breast primary. On recent contrast enhanced digital mammogram an asymmetry with associated calcifications in the lateral aspect of the left breast was visualized. Stereotactic core needle biopsy recommended. EXAM: LEFT BREAST STEREOTACTIC CORE NEEDLE BIOPSY COMPARISON:  Previous exam(s). FINDINGS: The patient and I discussed the procedure of stereotactic-guided biopsy including benefits and alternatives. We discussed the high likelihood of a successful procedure. We discussed the risks of the procedure including infection, bleeding, tissue injury, clip migration, and inadequate sampling. Informed written consent was given. The usual time out protocol was performed immediately prior to the procedure. Using sterile technique and 1% lidocaine  and 1% lidocaine  with epinephrine  as local anesthetic, under stereotactic guidance, a 9 gauge vacuum assisted device was used to attempt biopsy of the asymmetry and calcifications in the lateral aspect the left breast using a lateral to medial approach. Specimen radiograph was performed showing no calcifications. Lesion quadrant: Post biopsy mammogram demonstrates the coil shaped clip is in the medial aspect of the breast and located 6.2 cm medial to the asymmetry and calcifications seen on the patient's contrast enhance  mammogram. At the conclusion of the procedure, coil shaped tissue marker clip was deployed into the biopsy cavity. Follow-up 2-view mammogram was performed and dictated separately. IMPRESSION: Stereotactic-guided biopsy of the left breast. The coil shaped clip is located 6.2 cm medial to the asymmetry and calcifications. Patient has been scheduled for biopsy of the asymmetry and calcifications on 05/31/2024 using a superior to inferior approach. Electronically Signed: By: Dina  Arceo M.D. On: 05/28/2024 15:04   MM CLIP PLACEMENT LEFT Result Date: 05/28/2024 CLINICAL DATA:  Status post stereotactic biopsy of the left breast. EXAM: 3D DIAGNOSTIC LEFT MAMMOGRAM POST STEREOTACTIC BIOPSY COMPARISON:  Previous exam(s). ACR Breast Density Category a: The breasts are almost entirely fatty. FINDINGS: 3D Mammographic images were obtained following stereotactic guided biopsy of the left breast. The coil shaped biopsy marker clip is located in the medial aspect of the left breast. It is 6.2 cm medial to the asymmetry and calcifications seen on the patient's diagnostic mammogram. IMPRESSION: The coil shaped biopsy marker clip is in the medial aspect of the left breast and approximately 6.2 cm medial to the asymmetry and calcifications seen on the patient's recent contrast enhanced mammogram. The patient could not tolerate a second stereotactic biopsy and has been scheduled for a stereotactic biopsy of the asymmetry and calcifications on 05/31/2024 using a superior to inferior approach. Final Assessment: Post Procedure Mammograms for Marker Placement Electronically Signed   By: Harrie Deis M.D.   On: 05/28/2024 15:09   MM 3D DIAG BILAT WITH CONTRAST BCG ONLY Result Date: 05/22/2024 CLINICAL DATA:  Patient has a history of a metastatic left axillary lymph node, biopsied on 07/12/2023. Pathology was consistent with a breast primary. No breast abnormality has been detected. Patient cannot undergo MRI. She has undergone  neoadjuvant chemotherapy with a significant positive response, with a marked decrease in the size of the biopsied metastatic lymph node.  Contrast enhanced mammography was ordered in an attempt to localize a breast primary. EXAM: DIAGNOSTIC CONTRAST ENHANCED DIGITAL MAMMOGRAM BILATERAL 3D TECHNIQUE: Bilateral digital diagnostic mammography and breast tomosynthesis were performed. Contrast enhanced images were acquired following the intravenous administration of 100 milliliters of Isovue  370. Contrast reactions: none Labs: none COMPARISON:  Previous exam(s). ACR Breast Density Category a: The breasts are almost entirely fatty. Background parenchymal enhancement: Minimal.  Symmetric. FINDINGS: On the lower NG exams, there is a small group of calcifications with an associated asymmetry in the anterior, lateral aspect of the breast. This appears to have developed since the 2022 mammograms. Overall asymmetry and calcifications span approximately 1 cm in long dimension. There are no other areas of asymmetry or suspicious calcifications. There are no masses or areas of distortion. On the recombined images, there is no enhancement in either breast. There is no enhancement associated with the small group of calcifications and associated asymmetry in the left breast. The metastatic left axillary lymph node does show enhancement along the periphery of its cortex. IMPRESSION: 1. Small asymmetry with associated calcifications in the anterior, lateral left breast, without associated abnormal enhancement. Given the known metastatic disease to the left axilla, this could reflect the primary lesion. There is no other evidence of a primary breast malignancy. There are no areas of abnormal enhancement in either breast. RECOMMENDATION: 1. Consider stereotactic core needle biopsy of the asymmetry and associated calcifications in the lateral left breast if this would alter treatment management. I have discussed the findings and  recommendations with the patient. If applicable, a reminder letter will be sent to the patient regarding the next appointment. BI-RADS CATEGORY  4: Suspicious. Electronically Signed   By: Alm Parkins M.D.   On: 05/22/2024 13:19       IMPRESSION/PLAN: 1. 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 Dr. Dewey discusses the pathology findings and reviews her course to date.  Unfortunately she did not have MRIs or contrast-enhanced imaging of the breast prior to her chemotherapy so it is difficult to determine if she had any focal areas in the breast however she has had a good response in her axilla, and she is planning on targeted axillary lymph node biopsy and because of the atypical hyperplasia seen on the biopsy last week, Dr. Aron is also planning on lumpectomy at the time of node surgery.  Dr. Dewey discusses the rationale for external radiotherapy to the lymph node stations that drain the breast to reduce risks of local recurrence and would also favor treating the breast as well. Dr. Odean anticipates adjuvant antiestrogen therapy to follow. We discussed the risks, benefits, short, and long term effects of radiotherapy, as well as the curative intent, and the patient is interested in proceeding. Dr. Dewey discusses the delivery and logistics of radiotherapy and anticipates a course of 6 1/2 weeks of radiotherapy pending her surgical results. We will see her back a few weeks after surgery to discuss the simulation process and anticipate we starting radiotherapy about 4-6 weeks after surgery.    This encounter was conducted via telephone.  The patient has provided two factor identification and has given verbal consent for this type of encounter and has been advised to only accept a meeting of this type in a secure network environment. The time spent during this encounter was 60 minutes including preparation, discussion, and coordination of the patient's  care. The attendants for this meeting include Sharene Cary, RN, Dr. Dewey, Donald Estefana Husband  and Feven M Doss.  During the encounter,  Sharene Cary, RN, Dr. Dewey, and Donald Estefana Husband were located at [facility] Radiation Oncology Department.  Marvin M Newcomer was located at home.    The above documentation reflects my direct findings during this shared patient visit. Please see the separate note by Dr. Dewey on this date for the remainder of the patient's plan of care.    Donald KYM Husband, [facility]    **Disclaimer: This note was dictated with voice recognition software. Similar sounding words can inadvertently be transcribed and this note may contain transcription errors which may not have been corrected upon publication of note.**

## 2024-06-19 NOTE — Progress Notes (Signed)
 New Breast Cancer Diagnosis: Left Axilla  Patient palpated a lump in the left axilla in September 2024.  Histology per Pathology Report: grade ,   Receptor Status: ER(positive), PR (positive), Her2-neu (negative), Ki-(%)   Surgeon and surgical plan, if any:  Dr. Aron -Radioactive seed guided axillary sentinel lymph node biopsy 06/26/2024   Medical oncologist, treatment if any:   Dr. 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 Aron) Adjuvant radiation therapy Adjuvant antiestrogen therapy with CDK inhibitors   Family History of Breast/Ovarian/Prostate Cancer: Multiple maternal first cousins and maternal aunt had breast cancer and Paternal aunt had breast cancer.  Lymphedema issues, if any: No     Pain issues, if any: None    SAFETY ISSUES: Prior radiation? No Pacemaker/ICD? No Possible current pregnancy? Tubal Ligation Is the patient on methotrexate? No  Current Complaints / other details:   Port in place

## 2024-06-20 NOTE — Pre-Procedure Instructions (Signed)
 Surgical Instructions   Your procedure is scheduled on June 26, 2024. Report to [facility] Main Entrance A at 11:00 A.M., then check in with the Admitting office. Any questions or running late day of surgery: call [phone]  Questions prior to your surgery date: call [phone], Monday-Friday, 8am-4pm. If you experience any cold or flu symptoms such as cough, fever, chills, shortness of breath, etc. between now and your scheduled surgery, please notify us  at the above number.     Remember:  Do not eat after midnight the night before your surgery   You may drink clear liquids until 10:00 AM the morning of your surgery.   Clear liquids allowed are: Water, Non-Citrus Juices (without pulp), Carbonated Beverages, Clear Tea (no milk, honey, etc.), Black Coffee Only (NO MILK, CREAM OR POWDERED CREAMER of any kind), and Gatorade.    Take these medicines the morning of surgery with A SIP OF WATER: escitalopram  (LEXAPRO )  metoprolol  succinate (TOPROL  XL)    May take these medicines IF NEEDED: albuterol  (PROAIR  HFA) inhaler - please bring inhaler with you morning of surgery cetirizine  (ZYRTEC )  cyclobenzaprine (FLEXERIL)  gabapentin  (NEURONTIN )  montelukast (SINGULAIR)  Oxycodone  HCl  SYMBICORT inhaler    Follow your surgeon's instructions on when to stop Aspirin.  If no instructions were given by your surgeon then you will need to call the office to get those instructions.     One week prior to surgery, STOP taking any Aleve , Naproxen , Ibuprofen, Motrin, Advil, Goody's, BC's, all herbal medications, fish oil, and non-prescription vitamins.   WHAT DO I DO ABOUT MY DIABETES MEDICATION?   Do not take glipiZIDE (GLUCOTROL) the evening before surgery or the morning of surgery.  STOP taking your MOUNJARO one week prior to surgery. DO NOT take any doses after August 18th.   HOW TO MANAGE YOUR DIABETES BEFORE AND AFTER SURGERY  Why is it important to control my blood sugar  before and after surgery? Improving blood sugar levels before and after surgery helps healing and can limit problems. A way of improving blood sugar control is eating a healthy diet by:  Eating less sugar and carbohydrates  Increasing activity/exercise  Talking with your doctor about reaching your blood sugar goals High blood sugars (greater than 180 mg/dL) can raise your risk of infections and slow your recovery, so you will need to focus on controlling your diabetes during the weeks before surgery. Make sure that the doctor who takes care of your diabetes knows about your planned surgery including the date and location.  How do I manage my blood sugar before surgery? Check your blood sugar at least 4 times a day, starting 2 days before surgery, to make sure that the level is not too high or low.  Check your blood sugar the morning of your surgery when you wake up and every 2 hours until you get to the Short Stay unit.  If your blood sugar is less than 70 mg/dL, you will need to treat for low blood sugar: Do not take insulin. Treat a low blood sugar (less than 70 mg/dL) with  cup of clear juice (cranberry or apple), 4 glucose tablets, OR glucose gel. Recheck blood sugar in 15 minutes after treatment (to make sure it is greater than 70 mg/dL). If your blood sugar is not greater than 70 mg/dL on recheck, call [phone] for further instructions. Report your blood sugar to the short stay nurse when you get to Short Stay.  If you are admitted to  the hospital after surgery: Your blood sugar will be checked by the staff and you will probably be given insulin after surgery (instead of oral diabetes medicines) to make sure you have good blood sugar levels. The goal for blood sugar control after surgery is 80-180 mg/dL.                      Do NOT Smoke (Tobacco/Vaping) for 24 hours prior to your procedure.  If you use a CPAP at night, you may bring your mask/headgear for your overnight  stay.   You will be asked to remove any contacts, glasses, piercing's, hearing aid's, dentures/partials prior to surgery. Please bring cases for these items if needed.    Patients discharged the day of surgery will not be allowed to drive home, and someone needs to stay with them for 24 hours.  SURGICAL WAITING ROOM VISITATION Patients may have no more than 2 support people in the waiting area - these visitors may rotate.   Pre-op nurse will coordinate an appropriate time for 1 ADULT support person, who may not rotate, to accompany patient in pre-op.  Children under the age of 20 must have an adult with them who is not the patient and must remain in the main waiting area with an adult.  If the patient needs to stay at the hospital during part of their recovery, the visitor guidelines for inpatient rooms apply.  Please refer to the [facility] website for the visitor guidelines for any additional information.   If you received a COVID test during your pre-op visit  it is requested that you wear a mask when out in public, stay away from anyone that may not be feeling well and notify your surgeon if you develop symptoms. If you have been in contact with anyone that has tested positive in the last 10 days please notify you surgeon.      Pre-operative CHG Bathing Instructions   You can play a key role in reducing the risk of infection after surgery. Your skin needs to be as free of germs as possible. You can reduce the number of germs on your skin by washing with CHG (chlorhexidine gluconate) soap before surgery. CHG is an antiseptic soap that kills germs and continues to kill germs even after washing.   DO NOT use if you have an allergy to chlorhexidine/CHG or antibacterial soaps. If your skin becomes reddened or irritated, stop using the CHG and notify one of our RNs at [phone].              TAKE A SHOWER THE NIGHT BEFORE SURGERY AND THE DAY OF SURGERY    Please keep in mind the  following:  DO NOT shave, including legs and underarms, 48 hours prior to surgery.   You may shave your face before/day of surgery.  Place clean sheets on your bed the night before surgery Use a clean washcloth (not used since being washed) for each shower. DO NOT sleep with pet's night before surgery.  CHG Shower Instructions:  Wash your face and private area with normal soap. If you choose to wash your hair, wash first with your normal shampoo.  After you use shampoo/soap, rinse your hair and body thoroughly to remove shampoo/soap residue.  Turn the water OFF and apply half the bottle of CHG soap to a CLEAN washcloth.  Apply CHG soap ONLY FROM YOUR NECK DOWN TO YOUR TOES (washing for 3-5 minutes)  DO NOT use CHG  soap on face, private areas, open wounds, or sores.  Pay special attention to the area where your surgery is being performed.  If you are having back surgery, having someone wash your back for you may be helpful. Wait 2 minutes after CHG soap is applied, then you may rinse off the CHG soap.  Pat dry with a clean towel  Put on clean pajamas    Additional instructions for the day of surgery: DO NOT APPLY any lotions, deodorants, cologne, or perfumes.   Do not wear jewelry or makeup Do not wear nail polish, gel polish, artificial nails, or any other type of covering on natural nails (fingers and toes) Do not bring valuables to the hospital. [facility] is not responsible for valuables/personal belongings. Put on clean/comfortable clothes.  Please brush your teeth.  Ask your nurse before applying any prescription medications to the skin.

## 2024-06-21 ENCOUNTER — Encounter (HOSPITAL_COMMUNITY): Payer: Self-pay

## 2024-06-21 ENCOUNTER — Other Ambulatory Visit: Payer: Self-pay

## 2024-06-21 ENCOUNTER — Ambulatory Visit

## 2024-06-21 ENCOUNTER — Encounter

## 2024-06-21 ENCOUNTER — Encounter (HOSPITAL_COMMUNITY)
Admission: RE | Admit: 2024-06-21 | Discharge: 2024-06-21 | Disposition: A | Discharge status: Home / Self Care | Source: Ambulatory Visit | Attending: General Surgery | Admitting: General Surgery

## 2024-06-21 VITALS — BP 131/46 | HR 101 | Temp 99.0°F | Resp 18 | Ht 69.0 in | Wt >= 6400 oz

## 2024-06-21 DIAGNOSIS — Z6841 Body Mass Index (BMI) 40.0 and over, adult: Secondary | ICD-10-CM | POA: Insufficient documentation

## 2024-06-21 DIAGNOSIS — D573 Sickle-cell trait: Secondary | ICD-10-CM | POA: Diagnosis not present

## 2024-06-21 DIAGNOSIS — Z01818 Encounter for other preprocedural examination: Secondary | ICD-10-CM | POA: Diagnosis present

## 2024-06-21 DIAGNOSIS — E1142 Type 2 diabetes mellitus with diabetic polyneuropathy: Secondary | ICD-10-CM | POA: Diagnosis not present

## 2024-06-21 DIAGNOSIS — R7309 Other abnormal glucose: Secondary | ICD-10-CM

## 2024-06-21 DIAGNOSIS — K769 Liver disease, unspecified: Secondary | ICD-10-CM | POA: Diagnosis not present

## 2024-06-21 DIAGNOSIS — K760 Fatty (change of) liver, not elsewhere classified: Secondary | ICD-10-CM | POA: Diagnosis not present

## 2024-06-21 DIAGNOSIS — R0683 Snoring: Secondary | ICD-10-CM | POA: Insufficient documentation

## 2024-06-21 DIAGNOSIS — I10 Essential (primary) hypertension: Secondary | ICD-10-CM | POA: Diagnosis not present

## 2024-06-21 DIAGNOSIS — F419 Anxiety disorder, unspecified: Secondary | ICD-10-CM | POA: Insufficient documentation

## 2024-06-21 DIAGNOSIS — C50912 Malignant neoplasm of unspecified site of left female breast: Secondary | ICD-10-CM | POA: Insufficient documentation

## 2024-06-21 DIAGNOSIS — Z86718 Personal history of other venous thrombosis and embolism: Secondary | ICD-10-CM | POA: Insufficient documentation

## 2024-06-21 DIAGNOSIS — G4733 Obstructive sleep apnea (adult) (pediatric): Secondary | ICD-10-CM | POA: Insufficient documentation

## 2024-06-21 DIAGNOSIS — Z794 Long term (current) use of insulin: Secondary | ICD-10-CM | POA: Insufficient documentation

## 2024-06-21 HISTORY — DX: Unspecified osteoarthritis, unspecified site: M19.90

## 2024-06-21 HISTORY — DX: Thalassemia minor: D56.3

## 2024-06-21 HISTORY — DX: Fatty (change of) liver, not elsewhere classified: K76.0

## 2024-06-21 HISTORY — DX: Migraine, unspecified, not intractable, without status migrainosus: G43.909

## 2024-06-21 LAB — COMPREHENSIVE METABOLIC PANEL WITH GFR
ALT: 50 U/L — ABNORMAL HIGH (ref 0–44)
AST: 38 U/L (ref 15–41)
Albumin: 3 g/dL — ABNORMAL LOW (ref 3.5–5.0)
Alkaline Phosphatase: 108 U/L (ref 38–126)
Anion gap: 13 (ref 5–15)
BUN: 8 mg/dL (ref 6–20)
CO2: 24 mmol/L (ref 22–32)
Calcium: 9.2 mg/dL (ref 8.9–10.3)
Chloride: 104 mmol/L (ref 98–111)
Creatinine, Ser: 1.05 mg/dL — ABNORMAL HIGH (ref 0.44–1.00)
GFR, Estimated: >60 mL/min (ref >60)
Glucose, Bld: 118 mg/dL — ABNORMAL HIGH (ref 70–99)
Potassium: 3.7 mmol/L (ref 3.5–5.1)
Sodium: 141 mmol/L (ref 135–145)
Total Bilirubin: 0.4 mg/dL (ref 0.0–1.2)
Total Protein: 7.3 g/dL (ref 6.5–8.1)

## 2024-06-21 LAB — CBC
HCT: 35.8 % — ABNORMAL LOW (ref 36.0–46.0)
Hemoglobin: 10.9 g/dL — ABNORMAL LOW (ref 12.0–15.0)
MCH: 20.9 pg — ABNORMAL LOW (ref 26.0–34.0)
MCHC: 30.4 g/dL (ref 30.0–36.0)
MCV: 68.6 fL — ABNORMAL LOW (ref 80.0–100.0)
Platelets: 459 K/uL — ABNORMAL HIGH (ref 150–400)
RBC: 5.22 MIL/uL — ABNORMAL HIGH (ref 3.87–5.11)
RDW: 18.9 % — ABNORMAL HIGH (ref 11.5–15.5)
WBC: 6.5 K/uL (ref 4.0–10.5)
nRBC: 0 % (ref 0.0–0.2)

## 2024-06-21 LAB — HEMOGLOBIN A1C
Hgb A1c MFr Bld: 7.4 % — ABNORMAL HIGH (ref 4.8–5.6)
Mean Plasma Glucose: 165.68 mg/dL

## 2024-06-21 LAB — GLUCOSE, CAPILLARY: Glucose-Capillary: 134 mg/dL — ABNORMAL HIGH (ref 70–99)

## 2024-06-21 NOTE — Progress Notes (Signed)
 PCP - Dr. Stacy Cardiologist - denies  Chest x-ray - 09/21/23 EKG - 06/21/24 Stress Test - denies ECHO - 08/05/23 Cardiac Cath - denies  Sleep Study - +OSA CPAP - awaiting appt to be fitted for CPAP  Fasting Blood Sugar - 130's Checks Blood Sugar : BID  Last dose of GLP1 agonist-  06/21/24 GLP1 instructions: No further doses of Mounjaro until after surgery, pt reports she was not provided previously with GLP1 instructions to HOLD 1 week  Blood Thinner Instructions: was previously on Eliquis , stopped approx 2 months ago per pt, course completed Aspirin Instructions: Takes ASA for pain, will stop  ERAS Protcol - clears until 1000 PRE-SURGERY Ensure or G2-   COVID TEST- n/a   Anesthesia review: Seed placement   Patient denies shortness of breath, fever, cough and chest pain at PAT appointment

## 2024-06-22 NOTE — Progress Notes (Signed)
 Anesthesia Chart Review:  Case: [id number] Date/Time: 06/26/24 1245   Procedures:      RADIOACTIVE SEED GUIDED AXILLARY SENTINEL LYMPH NODE BIOPSY (Left)     BIOPSY, LYMPH NODE, SENTINEL (Left) - GEN w/PEC BLOCK LEFT SEED TARGETED LYMPH NODE EXCISION SENTINEL LYMPH NODE BIOPSY LEFT BREAST SEED LOCALIZED EXCISIONAL BIOPSY     RADIOACTIVE SEED GUIDED BREAST BIOPSY (Left)   Anesthesia type: General   Pre-op diagnosis: LEFT BREAST CANCER   [facility] ROOM [facility] / [facility]   Surgeons: Aron Shoulders, MD       DISCUSSION: Patient is a 48 year old female scheduled for the above procedure.  History includes never smoker, HTN, DM2, left breast cancer (with +left axillary LN, s/p chemotherapy 08/15/2023 - 01/17/2024; right internal jugular Port-a-cath 08/08/2023), DVT/PT (LLE DVT & PE ~2006; internal jugular thrombus associated with Port-a-cath 09/21/2023), anemia, sickle cell trait, beta thalassemia trait, fatty liver, snoring, migraines, morbid obesity, edema, OSA/snoring (severe with AHI 30/hr 01/08/2014; currently waiting to be fitted for new CPAP), anxiety.  Per [facility] Dr. Odean, on 6/23/205, It is my opinion that our mutual patient, [name] will discontinue Eliquis  1 week prior to surgery, and will not need Lovenox , nor will she need to restart Eliquis .  My nurse attempted to call the patient to make her aware, but was unable to reach her.  Please let me know if you have any questions or concerns. She reported she was prescribed at least a 3 month course of Eliquis  and is now off after what sounds like treatment for six months.  A1c 7.4%. On glipizide, Mounjaro. Last Mounjaro 06/21/2024, on hold until after surgery.  Radioactive seed scheduled for 06/26/2024 at 9:30 AM.  Anesthesia team to evaluate on the day of surgery.    VS: BP (!) 131/46   Pulse (!) 101   Temp 37.2 C   Resp 18   Ht 5' 9 (1.753 m)   Wt (!) 183.7 kg   LMP 02/20/2022 (Exact Date)   SpO2 97%   BMI 59.81 kg/m  I  called her, and she confirmed she is post-menopausal with LMP 02/20/2022.    PROVIDERS: Stacy Martinis, MD is PCP  Odean Potts, MD is DOUGLASS Dewey Rush, MD is RAD-ONC    LABS: Labs reviewed: Acceptable for surgery.ALT 50, with normal AST, total bilirubin. PLT 459K.  (all labs ordered are listed, but only abnormal results are displayed)  Labs Reviewed  GLUCOSE, CAPILLARY - Abnormal; Notable for the following components:      Result Value   Glucose-Capillary 134 (*)    All other components within normal limits  HEMOGLOBIN A1C - Abnormal; Notable for the following components:   Hgb A1c MFr Bld 7.4 (*)    All other components within normal limits  COMPREHENSIVE METABOLIC PANEL WITH GFR - Abnormal; Notable for the following components:   Glucose, Bld 118 (*)    Creatinine, Ser 1.05 (*)    Albumin 3.0 (*)    ALT 50 (*)    All other components within normal limits  CBC - Abnormal; Notable for the following components:   RBC 5.22 (*)    Hemoglobin 10.9 (*)    HCT 35.8 (*)    MCV 68.6 (*)    MCH 20.9 (*)    RDW 18.9 (*)    Platelets 459 (*)    All other components within normal limits    Normal PFTs 05/21/2020 ([facility]).   IMAGES: CT Soft Tissue Neck 09/21/23: IMPRESSION: 1. Evaluation is  limited by body habitus, but a structure lateral to the right IJ or connected to it appears to have surrounding fat stranding, which could represent a hematoma or abscess. Correlate with physical exam and consider ultrasound for further evaluation. 2. Right subclavian approach catheter appears to be looped or kinked, which is new compared to the 09/02/2023 radiograph. Correlate with physical exam. 3. Prominent right level three lymph node measures up to 6 mm in short axis, which is nonspecific, favored to be reactive.  CXR 11/202/24: FINDINGS: Right Port-A-Cath in place. The catheter loops in the right upper chest, new since prior study. Tip is in the SVC. Heart and mediastinal  contours are within normal limits. No focal opacities or effusions. No acute bony abnormality.   IMPRESSION: - Right Port-A-Cath catheter loops in the right upper chest. Tip is in the SVC. - No active cardiopulmonary disease.   EKG: 06/21/2024: NSR   CV: Echo 08/05/2023: IMPRESSIONS   1. Left ventricular ejection fraction, by estimation, is 60 to 65%. The  left ventricle has normal function. The left ventricle has no regional  wall motion abnormalities. Left ventricular diastolic parameters were  normal.   2. Right ventricular systolic function is normal. The right ventricular  size is normal.   3. The mitral valve is grossly normal. Trivial mitral valve  regurgitation. No evidence of mitral stenosis.   4. The aortic valve is normal in structure. Aortic valve regurgitation is  not visualized. No aortic stenosis is present.   5. The inferior vena cava is normal in size with greater than 50%  respiratory variability, suggesting right atrial pressure of 3 mmHg.    Past Medical History:  Diagnosis Date   Allergic rhinitis    Anemia    Anxiety disorder    Arthritis    Asthma    Benign essential hypertension    Beta thalassemia trait    Depression    Diabetes mellitus without complication (HCC)    Edema    Fatty liver    History of blood clots    Hyperglycemia    Lumbar radiculopathy    Metrorrhagia    Migraines    Morbid obesity (HCC)    OSA (obstructive sleep apnea)    Sickle cell trait (HCC)    Snoring     Past Surgical History:  Procedure Laterality Date   BREAST BIOPSY Left 05/28/2024   MM LT BREAST BX W LOC DEV 1ST LESION IMAGE BX SPEC STEREO GUIDE 05/28/2024 [facility]   BREAST BIOPSY Left 06/11/2024   MM LT BREAST BX W LOC DEV 1ST LESION IMAGE BX SPEC STEREO GUIDE 06/11/2024 [facility]   CESAREAN SECTION     CHOLECYSTECTOMY     IR IMAGING GUIDED PORT INSERTION  08/08/2023   IR US  SOFT TISSUE NECK  09/21/2023   TUBAL LIGATION       MEDICATIONS:  MOUNJARO 12.5 MG/0.5ML Pen   albuterol  (PROAIR  HFA) 108 (90 Base) MCG/ACT inhaler   aspirin EC 325 MG tablet   cetirizine  (ZYRTEC ) 10 MG tablet   cyclobenzaprine (FLEXERIL) 10 MG tablet   escitalopram  (LEXAPRO ) 20 MG tablet   gabapentin  (NEURONTIN ) 300 MG capsule   glipiZIDE (GLUCOTROL) 10 MG tablet   ibuprofen (ADVIL) 200 MG tablet   losartan  (COZAAR ) 100 MG tablet   metoprolol  succinate (TOPROL  XL) 100 MG 24 hr tablet   montelukast (SINGULAIR) 10 MG tablet   Oxycodone  HCl 20 MG TABS   SYMBICORT 80-4.5 MCG/ACT inhaler   topiramate (TOPAMAX) 50 MG  tablet   No current facility-administered medications for this encounter.    heparin  lock flush 100 unit/mL   sodium chloride  flush (NS) 0.9 % injection 10 mL    Isaiah Ruder, PA-C Surgical Short Stay/Anesthesiology [facility] Phone [phone] [facility] Phone [phone] 06/22/2024 3:41 PM

## 2024-06-22 NOTE — Anesthesia Preprocedure Evaluation (Addendum)
 Anesthesia Evaluation  Patient identified by MRN, date of birth, ID band Patient awake    Reviewed: Allergy & Precautions, NPO status , Patient's Chart, lab work & pertinent test results, reviewed documented beta blocker date and time   Airway Mallampati: I  TM Distance: >3 FB Neck ROM: Full    Dental  (+) Teeth Intact, Dental Advisory Given, Chipped,    Pulmonary asthma , sleep apnea (noncompliant with CPAP) , PE   Pulmonary exam normal breath sounds clear to auscultation       Cardiovascular hypertension, Pt. on home beta blockers and Pt. on medications + DVT  Normal cardiovascular exam Rhythm:Regular Rate:Normal  Echo 08/05/2023: IMPRESSIONS   1. Left ventricular ejection fraction, by estimation, is 60 to 65%. The  left ventricle has normal function. The left ventricle has no regional  wall motion abnormalities. Left ventricular diastolic parameters were  normal.   2. Right ventricular systolic function is normal. The right ventricular  size is normal.   3. The mitral valve is grossly normal. Trivial mitral valve  regurgitation. No evidence of mitral stenosis.   4. The aortic valve is normal in structure. Aortic valve regurgitation is  not visualized. No aortic stenosis is present.   5. The inferior vena cava is normal in size with greater than 50%  respiratory variability, suggesting right atrial pressure of 3 mmHg.    Neuro/Psych  Headaches PSYCHIATRIC DISORDERS Anxiety Depression       GI/Hepatic negative GI ROS,,,(+)     substance abuse    Endo/Other  diabetes, Type 2, Oral Hypoglycemic Agents  Class 4 obesity (BMI 60)  Renal/GU negative Renal ROS  negative genitourinary   Musculoskeletal  (+) Arthritis ,  narcotic dependent  Abdominal   Peds  Hematology  (+) Blood dyscrasia, Sickle cell trait   Anesthesia Other Findings History includes never smoker, HTN, DM2, left breast cancer (with +left axillary  LN, s/p chemotherapy 08/15/2023 - 01/17/2024; right internal jugular Port-a-cath 08/08/2023), DVT/PT (LLE DVT & PE ~2006; internal jugular thrombus associated with Port-a-cath 09/21/2023), anemia, sickle cell trait, beta thalassemia trait, fatty liver, snoring, migraines, morbid obesity, edema, OSA/snoring (severe with AHI 30/hr 01/08/2014; currently waiting to be fitted for new CPAP), anxiety.  Reproductive/Obstetrics                              Anesthesia Physical Anesthesia Plan  ASA: 4  Anesthesia Plan: General and Regional   Post-op Pain Management: Tylenol  PO (pre-op)*, Regional block* and Ketamine IV*   Induction: Intravenous  PONV Risk Score and Plan: 3 and Midazolam , Dexamethasone  and Ondansetron   Airway Management Planned: Oral ETT  Additional Equipment:   Intra-op Plan:   Post-operative Plan: Extubation in OR  Informed Consent: I have reviewed the patients History and Physical, chart, labs and discussed the procedure including the risks, benefits and alternatives for the proposed anesthesia with the patient or authorized representative who has indicated his/her understanding and acceptance.     Dental advisory given  Plan Discussed with: CRNA  Anesthesia Plan Comments: (PAT note written 06/22/2024 by Allison Zelenak, PA-C.  )         Anesthesia Quick Evaluation

## 2024-06-25 NOTE — H&P (Signed)
 [name] is an 48 y.o. female.   Chief Complaint: Left breast cancer HPI:  Patient presented 07/2023 with new diagnosis of left breast cancer after finding an enlarged axillary lymph node. This node was 2.6 cm in greatest dimension.  Prognostic panel was +/+/-.  Dx mammogram and ultrasound were negative for breast disease.  MRI was recommended, but wasn't done for unclear reasons. She started care at [facility].  She began neoadjuvant chemotherapy including AC x 4 +Taxol  x8 (stopped early).    She had repeat u/s and mammogram with significant decrease in size of lymph node and again no breast findings.  Staging studies were negative pre and post chemo.  We discussed pros and cons of mastectomy vs axillary surgery only in setting of occult breast disease.    We got post chemo contrasted mammogram which is non ideal in setting of no comparison pre chemo and there was a small group of calcifications in the upper lateral left breast that was felt to be concerning (birads 4). Core needle biopsy was ADH and fibroadenoma and was felt to be discordant.     Past Medical History:  Diagnosis Date   Allergic rhinitis    Anemia    Anxiety disorder    Arthritis    Asthma    Benign essential hypertension    Beta thalassemia trait    Depression    Diabetes mellitus without complication (HCC)    Edema    Fatty liver    History of blood clots    Hyperglycemia    Lumbar radiculopathy    Metrorrhagia    Migraines    Morbid obesity (HCC)    OSA (obstructive sleep apnea)    Sickle cell trait (HCC)    Snoring     Past Surgical History:  Procedure Laterality Date   BREAST BIOPSY Left 05/28/2024   MM LT BREAST BX W LOC DEV 1ST LESION IMAGE BX SPEC STEREO GUIDE 05/28/2024 [facility]   BREAST BIOPSY Left 06/11/2024   MM LT BREAST BX W LOC DEV 1ST LESION IMAGE BX SPEC STEREO GUIDE 06/11/2024 [facility]   CESAREAN SECTION     CHOLECYSTECTOMY     IR IMAGING GUIDED PORT INSERTION  08/08/2023    IR US  SOFT TISSUE NECK  09/21/2023   TUBAL LIGATION      Family History  Problem Relation Age of Onset   Asthma Mother    Hypertension Mother    Leukemia Father    Diabetes Father    Breast cancer Maternal Aunt    Breast cancer Paternal Aunt    Breast cancer Cousin    Diabetes Brother    Social History:  reports that she has never smoked. She has never used smokeless tobacco. She reports that she does not currently use alcohol. She reports that she does not use drugs.  Allergies:  Allergies  Allergen Reactions   Doxycycline Hives   Fish Allergy Hives and Shortness Of Breath    Gets dizzy   Fish Oil Anaphylaxis   Other Hives, Anaphylaxis and Itching    Allergic to nuts. Causes mouth to itch, breaks out in hives, trouble breathing   Pollen Extract Itching   Citalopram Itching and Hives    unknown Made her pass out   Amoxicillin      REACTION: hives Has patient had a PCN reaction causing immediate rash, facial/tongue/throat swelling, SOB or lightheadedness with hypotension: Yes Has patient had a PCN reaction causing severe rash involving mucus membranes or skin  necrosis: No Has patient had a PCN reaction that required hospitalization No Has patient had a PCN reaction occurring within the last 10 years: No If all of the above answers are NO, then may proceed with Cephalosporin use.    Penicillins Hives   Strawberry Extract     Makes mouth itch    No medications prior to admission.    No results found for this or any previous visit (from the past 48 hours). No results found.  Review of Systems  All other systems reviewed and are negative.  *** Last menstrual period 08/20/2023. Physical Exam Constitutional:      General: She is not in acute distress.    Appearance: Normal appearance. She is not toxic-appearing. Ill appearance: chronically ill appearing. HENT:     Head: Atraumatic.     Comments: Alopecia improving    Right Ear: External ear normal.     Left  Ear: External ear normal.  Eyes:     General: No scleral icterus.    Conjunctiva/sclera: Conjunctivae normal.  Cardiovascular:     Rate and Rhythm: Normal rate and regular rhythm.  Pulmonary:     Effort: Pulmonary effort is normal.  Chest:     Chest wall: No tenderness.     Comments: Ptotic breasts Abdominal:     Palpations: Abdomen is soft.  Musculoskeletal:        General: No deformity or signs of injury.     Cervical back: Neck supple.  Lymphadenopathy:     Upper Body:     Left upper body: Axillary adenopathy present.  Skin:    General: Skin is warm.     Capillary Refill: Capillary refill takes 2 to 3 seconds.     Coloration: Skin is not jaundiced.     Findings: No rash.  Neurological:     Mental Status: She is alert and oriented to person, place, and time.  Psychiatric:        Mood and Affect: Mood normal.        Behavior: Behavior normal.        Thought Content: Thought content normal.        Judgment: Judgment normal.      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  She discussed both breast conservation and mastectomy with myself, plastics, and oncology.    We are opting for seed targeted lymph node excision of the index node where the diagnosis was made, sentinel node biopsy, and seed targeted excisional biopsy of the discordant ADH/fibroadenoma.    Risks reviewed and questions answered again.    Jina Nephew, MD 06/25/2024, 6:47 PM

## 2024-06-26 ENCOUNTER — Ambulatory Visit (HOSPITAL_COMMUNITY): Payer: Self-pay | Admitting: Vascular Surgery

## 2024-06-26 ENCOUNTER — Encounter (HOSPITAL_COMMUNITY): Payer: Self-pay

## 2024-06-26 ENCOUNTER — Other Ambulatory Visit: Payer: Self-pay

## 2024-06-26 ENCOUNTER — Inpatient Hospital Stay

## 2024-06-26 ENCOUNTER — Ambulatory Visit
Admission: RE | Admit: 2024-06-26 | Discharge: 2024-06-26 | Disposition: A | Discharge status: Home / Self Care | Source: Ambulatory Visit | Attending: General Surgery | Admitting: General Surgery

## 2024-06-26 ENCOUNTER — Ambulatory Visit (HOSPITAL_COMMUNITY)
Admission: RE | Admit: 2024-06-26 | Discharge: 2024-06-26 | Disposition: A | Discharge status: Home / Self Care | Attending: General Surgery | Admitting: General Surgery

## 2024-06-26 ENCOUNTER — Inpatient Hospital Stay: Admitting: Genetic Counselor

## 2024-06-26 ENCOUNTER — Ambulatory Visit (HOSPITAL_COMMUNITY)

## 2024-06-26 ENCOUNTER — Encounter (HOSPITAL_COMMUNITY)
Admission: RE | Disposition: A | Discharge status: Home / Self Care | Payer: Self-pay | Source: Home / Self Care | Attending: General Surgery

## 2024-06-26 ENCOUNTER — Encounter (HOSPITAL_COMMUNITY): Payer: Self-pay | Admitting: General Surgery

## 2024-06-26 ENCOUNTER — Other Ambulatory Visit: Payer: Self-pay | Admitting: General Surgery

## 2024-06-26 ENCOUNTER — Ambulatory Visit (HOSPITAL_COMMUNITY)
Admission: RE | Admit: 2024-06-26 | Discharge: 2024-06-26 | Disposition: A | Discharge status: Home / Self Care | Source: Ambulatory Visit | Attending: General Surgery | Admitting: General Surgery

## 2024-06-26 DIAGNOSIS — C50912 Malignant neoplasm of unspecified site of left female breast: Secondary | ICD-10-CM | POA: Diagnosis not present

## 2024-06-26 DIAGNOSIS — E1142 Type 2 diabetes mellitus with diabetic polyneuropathy: Secondary | ICD-10-CM

## 2024-06-26 DIAGNOSIS — Z1732 Human epidermal growth factor receptor 2 negative status: Secondary | ICD-10-CM | POA: Diagnosis not present

## 2024-06-26 DIAGNOSIS — I10 Essential (primary) hypertension: Secondary | ICD-10-CM

## 2024-06-26 DIAGNOSIS — Z7984 Long term (current) use of oral hypoglycemic drugs: Secondary | ICD-10-CM | POA: Diagnosis not present

## 2024-06-26 DIAGNOSIS — F32A Depression, unspecified: Secondary | ICD-10-CM | POA: Diagnosis not present

## 2024-06-26 DIAGNOSIS — F419 Anxiety disorder, unspecified: Secondary | ICD-10-CM | POA: Diagnosis not present

## 2024-06-26 DIAGNOSIS — E119 Type 2 diabetes mellitus without complications: Secondary | ICD-10-CM | POA: Diagnosis not present

## 2024-06-26 DIAGNOSIS — Z95828 Presence of other vascular implants and grafts: Secondary | ICD-10-CM | POA: Insufficient documentation

## 2024-06-26 DIAGNOSIS — J45909 Unspecified asthma, uncomplicated: Secondary | ICD-10-CM | POA: Diagnosis not present

## 2024-06-26 DIAGNOSIS — Z6841 Body Mass Index (BMI) 40.0 and over, adult: Secondary | ICD-10-CM | POA: Diagnosis not present

## 2024-06-26 DIAGNOSIS — Z1721 Progesterone receptor positive status: Secondary | ICD-10-CM | POA: Diagnosis not present

## 2024-06-26 DIAGNOSIS — C773 Secondary and unspecified malignant neoplasm of axilla and upper limb lymph nodes: Secondary | ICD-10-CM | POA: Insufficient documentation

## 2024-06-26 DIAGNOSIS — Z7985 Long-term (current) use of injectable non-insulin antidiabetic drugs: Secondary | ICD-10-CM | POA: Diagnosis not present

## 2024-06-26 DIAGNOSIS — Z86718 Personal history of other venous thrombosis and embolism: Secondary | ICD-10-CM | POA: Diagnosis not present

## 2024-06-26 DIAGNOSIS — Z9221 Personal history of antineoplastic chemotherapy: Secondary | ICD-10-CM | POA: Insufficient documentation

## 2024-06-26 DIAGNOSIS — Z170 Estrogen receptor positive status [ER+]: Secondary | ICD-10-CM | POA: Diagnosis not present

## 2024-06-26 DIAGNOSIS — G4733 Obstructive sleep apnea (adult) (pediatric): Secondary | ICD-10-CM | POA: Diagnosis not present

## 2024-06-26 DIAGNOSIS — Z91199 Patient's noncompliance with other medical treatment and regimen due to unspecified reason: Secondary | ICD-10-CM | POA: Insufficient documentation

## 2024-06-26 DIAGNOSIS — E6689 Other obesity not elsewhere classified: Secondary | ICD-10-CM | POA: Insufficient documentation

## 2024-06-26 HISTORY — PX: PORT-A-CATH REMOVAL: SHX5289

## 2024-06-26 HISTORY — PX: BREAST BIOPSY: SHX20

## 2024-06-26 HISTORY — PX: SENTINEL NODE BIOPSY: SHX6608

## 2024-06-26 HISTORY — PX: RADIOACTIVE SEED GUIDED AXILLARY SENTINEL LYMPH NODE: SHX6735

## 2024-06-26 LAB — GLUCOSE, CAPILLARY
Glucose-Capillary: 113 mg/dL — ABNORMAL HIGH (ref 70–99)
Glucose-Capillary: 74 mg/dL (ref 70–99)
Glucose-Capillary: 114 mg/dL — ABNORMAL HIGH (ref 70–99)

## 2024-06-26 SURGERY — RADIOACTIVE SEED GUIDED AXILLARY SENTINEL LYMPH NODE BIOPSY
Anesthesia: Regional | Site: Chest | Laterality: Right

## 2024-06-26 MED ORDER — CHLORHEXIDINE GLUCONATE CLOTH 2 % EX PADS: 6.0000 | MEDICATED_PAD | Freq: Once | CUTANEOUS | Status: DC

## 2024-06-26 MED ORDER — LACTATED RINGERS IV SOLN: INTRAVENOUS | Status: DC

## 2024-06-26 MED ORDER — HYDROMORPHONE HCL 1 MG/ML IJ SOLN
INTRAMUSCULAR | Status: AC
  Filled 2024-06-26: qty 1

## 2024-06-26 MED ORDER — FENTANYL CITRATE (PF) 250 MCG/5ML IJ SOLN
INTRAMUSCULAR | Status: AC
  Filled 2024-06-26: qty 5

## 2024-06-26 MED ORDER — DEXAMETHASONE SODIUM PHOSPHATE 10 MG/ML IJ SOLN: INTRAMUSCULAR | Status: DC | PRN

## 2024-06-26 MED ORDER — DEXAMETHASONE SODIUM PHOSPHATE 10 MG/ML IJ SOLN
INTRAMUSCULAR | Status: DC | PRN
  Administered 2024-06-26: 10 mg

## 2024-06-26 MED ORDER — LIDOCAINE 2% (20 MG/ML) 5 ML SYRINGE
INTRAMUSCULAR | Status: AC
  Filled 2024-06-26: qty 5

## 2024-06-26 MED ORDER — ONDANSETRON HCL 4 MG/2ML IJ SOLN
INTRAMUSCULAR | Status: AC
  Filled 2024-06-26: qty 2

## 2024-06-26 MED ORDER — OXYCODONE HCL 5 MG/5ML PO SOLN: 5.0000 mg | Freq: Once | ORAL | Status: AC | PRN

## 2024-06-26 MED ORDER — MIDAZOLAM HCL 2 MG/2ML IJ SOLN
INTRAMUSCULAR | Status: DC | PRN
  Administered 2024-06-26: 2 mg via INTRAVENOUS

## 2024-06-26 MED ORDER — MAGTRACE LYMPHATIC TRACER
INTRAMUSCULAR | Status: DC | PRN
  Administered 2024-06-26: 2 mL via INTRAMUSCULAR

## 2024-06-26 MED ORDER — PROPOFOL 10 MG/ML IV BOLUS
INTRAVENOUS | Status: DC | PRN
  Administered 2024-06-26: 200 mg via INTRAVENOUS

## 2024-06-26 MED ORDER — DEXAMETHASONE SODIUM PHOSPHATE 10 MG/ML IJ SOLN
INTRAMUSCULAR | Status: AC
  Filled 2024-06-26: qty 1

## 2024-06-26 MED ORDER — LIDOCAINE HCL (PF) 1 % IJ SOLN
INTRAMUSCULAR | Status: AC
  Filled 2024-06-26: qty 30

## 2024-06-26 MED ORDER — FENTANYL CITRATE (PF) 100 MCG/2ML IJ SOLN
INTRAMUSCULAR | Status: AC
  Administered 2024-06-26: 100 ug via INTRAVENOUS
  Filled 2024-06-26: qty 2

## 2024-06-26 MED ORDER — HYDROMORPHONE HCL 1 MG/ML IJ SOLN
0.2500 mg | INTRAMUSCULAR | Status: DC | PRN
  Administered 2024-06-26: 0.25 mg via INTRAVENOUS

## 2024-06-26 MED ORDER — OXYCODONE HCL 5 MG PO TABS
5.0000 mg | ORAL_TABLET | Freq: Four times a day (QID) | ORAL | 0 refills | Status: DC | PRN
Start: 2024-06-26 — End: 2024-07-09

## 2024-06-26 MED ORDER — ONDANSETRON HCL 4 MG/2ML IJ SOLN
INTRAMUSCULAR | Status: DC | PRN
  Administered 2024-06-26: 4 mg via INTRAVENOUS

## 2024-06-26 MED ORDER — SUCCINYLCHOLINE CHLORIDE 200 MG/10ML IV SOSY
PREFILLED_SYRINGE | INTRAVENOUS | Status: DC | PRN
  Administered 2024-06-26: 200 mg via INTRAVENOUS

## 2024-06-26 MED ORDER — TECHNETIUM TC 99M TILMANOCEPT KIT
1.0000 | PACK | Freq: Once | INTRAVENOUS | Status: AC
  Administered 2024-06-26: 1 via INTRADERMAL

## 2024-06-26 MED ORDER — LIDOCAINE 2% (20 MG/ML) 5 ML SYRINGE
INTRAMUSCULAR | Status: DC | PRN
  Administered 2024-06-26: 100 mg via INTRAVENOUS

## 2024-06-26 MED ORDER — SUGAMMADEX SODIUM 200 MG/2ML IV SOLN
INTRAVENOUS | Status: DC | PRN
  Administered 2024-06-26: 400 mg via INTRAVENOUS

## 2024-06-26 MED ORDER — ROPIVACAINE HCL 5 MG/ML IJ SOLN
INTRAMUSCULAR | Status: DC | PRN
  Administered 2024-06-26: 30 mL via PERINEURAL

## 2024-06-26 MED ORDER — ALBUTEROL SULFATE HFA 108 (90 BASE) MCG/ACT IN AERS
INHALATION_SPRAY | RESPIRATORY_TRACT | Status: DC | PRN
  Administered 2024-06-26: 4 via RESPIRATORY_TRACT

## 2024-06-26 MED ORDER — AMISULPRIDE (ANTIEMETIC) 5 MG/2ML IV SOLN: 10.0000 mg | Freq: Once | INTRAVENOUS | Status: DC | PRN

## 2024-06-26 MED ORDER — OXYCODONE HCL 5 MG PO TABS
ORAL_TABLET | ORAL | Status: AC
  Filled 2024-06-26: qty 1

## 2024-06-26 MED ORDER — ACETAMINOPHEN 500 MG PO TABS
1000.0000 mg | ORAL_TABLET | ORAL | Status: AC
  Administered 2024-06-26: 1000 mg via ORAL
  Filled 2024-06-26: qty 2

## 2024-06-26 MED ORDER — 0.9 % SODIUM CHLORIDE (POUR BTL) OPTIME
TOPICAL | Status: DC | PRN
  Administered 2024-06-26: 1000 mL

## 2024-06-26 MED ORDER — MIDAZOLAM HCL 2 MG/2ML IJ SOLN
INTRAMUSCULAR | Status: AC
  Administered 2024-06-26: 2 mg via INTRAVENOUS
  Filled 2024-06-26: qty 2

## 2024-06-26 MED ORDER — CHLORHEXIDINE GLUCONATE 0.12 % MT SOLN
15.0000 mL | Freq: Once | OROMUCOSAL | Status: AC
  Administered 2024-06-26: 15 mL via OROMUCOSAL
  Filled 2024-06-26: qty 15

## 2024-06-26 MED ORDER — FENTANYL CITRATE (PF) 250 MCG/5ML IJ SOLN
INTRAMUSCULAR | Status: DC | PRN
  Administered 2024-06-26 (×3): 50 ug via INTRAVENOUS

## 2024-06-26 MED ORDER — MIDAZOLAM HCL 2 MG/2ML IJ SOLN
INTRAMUSCULAR | Status: AC
  Filled 2024-06-26: qty 2

## 2024-06-26 MED ORDER — CIPROFLOXACIN IN D5W 400 MG/200ML IV SOLN
400.0000 mg | INTRAVENOUS | Status: AC
  Administered 2024-06-26: 400 mg via INTRAVENOUS
  Filled 2024-06-26: qty 200

## 2024-06-26 MED ORDER — ORAL CARE MOUTH RINSE: 15.0000 mL | Freq: Once | OROMUCOSAL | Status: AC

## 2024-06-26 MED ORDER — DEXAMETHASONE SODIUM PHOSPHATE 10 MG/ML IJ SOLN
INTRAMUSCULAR | Status: DC | PRN
  Administered 2024-06-26: 5 mg via INTRAVENOUS

## 2024-06-26 MED ORDER — MIDAZOLAM HCL 2 MG/2ML IJ SOLN: 2.0000 mg | Freq: Once | INTRAMUSCULAR | Status: AC

## 2024-06-26 MED ORDER — FENTANYL CITRATE (PF) 100 MCG/2ML IJ SOLN: 50.0000 ug | Freq: Once | INTRAMUSCULAR | Status: DC

## 2024-06-26 MED ORDER — PROPOFOL 10 MG/ML IV BOLUS
INTRAVENOUS | Status: AC
  Filled 2024-06-26: qty 20

## 2024-06-26 MED ORDER — LIDOCAINE HCL 1 % IJ SOLN
INTRAMUSCULAR | Status: DC | PRN
  Administered 2024-06-26: 25 mL via INTRAMUSCULAR

## 2024-06-26 MED ORDER — ROCURONIUM BROMIDE 10 MG/ML (PF) SYRINGE
PREFILLED_SYRINGE | INTRAVENOUS | Status: DC | PRN
  Administered 2024-06-26: 50 mg via INTRAVENOUS
  Administered 2024-06-26 (×2): 10 mg via INTRAVENOUS
  Administered 2024-06-26: 30 mg via INTRAVENOUS

## 2024-06-26 MED ORDER — OXYCODONE HCL 5 MG PO TABS
5.0000 mg | ORAL_TABLET | Freq: Once | ORAL | Status: AC | PRN
  Administered 2024-06-26: 5 mg via ORAL

## 2024-06-26 MED ORDER — FENTANYL CITRATE (PF) 100 MCG/2ML IJ SOLN: 100.0000 ug | Freq: Once | INTRAMUSCULAR | Status: AC

## 2024-06-26 MED ORDER — BUPIVACAINE-EPINEPHRINE (PF) 0.25% -1:200000 IJ SOLN
INTRAMUSCULAR | Status: AC
  Filled 2024-06-26: qty 30

## 2024-06-26 SURGICAL SUPPLY — 46 items
BINDER BREAST LRG (GAUZE/BANDAGES/DRESSINGS) IMPLANT
BINDER BREAST XLRG (GAUZE/BANDAGES/DRESSINGS) IMPLANT
BNDG COHESIVE 4X5 TAN STRL LF (GAUZE/BANDAGES/DRESSINGS) ×3 IMPLANT
CANISTER SUCTION 3000ML PPV (SUCTIONS) ×3 IMPLANT
CHLORAPREP W/TINT 26 (MISCELLANEOUS) ×3 IMPLANT
CLIP TI LARGE 6 (CLIP) ×3 IMPLANT
CLIP TI MEDIUM 24 (CLIP) ×3 IMPLANT
CLIP TI MEDIUM 6 (CLIP) IMPLANT
CNTNR URN SCR LID CUP LEK RST (MISCELLANEOUS) IMPLANT
COVER PROBE W GEL 5X96 (DRAPES) ×6 IMPLANT
COVER SURGICAL LIGHT HANDLE (MISCELLANEOUS) ×3 IMPLANT
DERMABOND ADVANCED .7 DNX12 (GAUZE/BANDAGES/DRESSINGS) ×3 IMPLANT
DEVICE DUBIN SPECIMEN MAMMOGRA (MISCELLANEOUS) ×3 IMPLANT
DRAPE CHEST BREAST 15X10 FENES (DRAPES) ×3 IMPLANT
DRAPE SURG 17X23 STRL (DRAPES) IMPLANT
ELECT COATED BLADE 2.86 ST (ELECTRODE) ×3 IMPLANT
ELECTRODE REM PT RTRN 9FT ADLT (ELECTROSURGICAL) ×3 IMPLANT
GAUZE PAD ABD 8X10 STRL (GAUZE/BANDAGES/DRESSINGS) ×3 IMPLANT
GAUZE SPONGE 4X4 12PLY STRL LF (GAUZE/BANDAGES/DRESSINGS) ×3 IMPLANT
GLOVE BIO SURGEON STRL SZ 6 (GLOVE) ×3 IMPLANT
GLOVE INDICATOR 6.5 STRL GRN (GLOVE) ×3 IMPLANT
GOWN STRL REUS W/ TWL LRG LVL3 (GOWN DISPOSABLE) ×3 IMPLANT
GOWN STRL REUS W/ TWL XL LVL3 (GOWN DISPOSABLE) ×3 IMPLANT
KIT BASIN OR (CUSTOM PROCEDURE TRAY) ×3 IMPLANT
KIT MARKER MARGIN INK (KITS) ×3 IMPLANT
LIGHT WAVEGUIDE WIDE FLAT (MISCELLANEOUS) IMPLANT
NDL 18GX1X1/2 (RX/OR ONLY) (NEEDLE) IMPLANT
NDL FILTER BLUNT 18X1 1/2 (NEEDLE) IMPLANT
NDL HYPO 25GX1X1/2 BEV (NEEDLE) ×3 IMPLANT
NEEDLE 18GX1X1/2 (RX/OR ONLY) (NEEDLE) IMPLANT
NEEDLE FILTER BLUNT 18X1 1/2 (NEEDLE) IMPLANT
NEEDLE HYPO 25GX1X1/2 BEV (NEEDLE) ×2 IMPLANT
NS IRRIG 1000ML POUR BTL (IV SOLUTION) ×3 IMPLANT
PACK GENERAL/GYN (CUSTOM PROCEDURE TRAY) ×3 IMPLANT
PACK UNIVERSAL I (CUSTOM PROCEDURE TRAY) ×3 IMPLANT
STOCKINETTE IMPERVIOUS 9X36 MD (GAUZE/BANDAGES/DRESSINGS) ×3 IMPLANT
STRIP CLOSURE SKIN 1/2X4 (GAUZE/BANDAGES/DRESSINGS) ×3 IMPLANT
SUT MNCRL AB 4-0 PS2 18 (SUTURE) ×6 IMPLANT
SUT SILK 2 0 SH (SUTURE) IMPLANT
SUT VIC AB 2-0 SH 27XBRD (SUTURE) IMPLANT
SUT VIC AB 3-0 SH 27X BRD (SUTURE) ×3 IMPLANT
SUT VIC AB 3-0 SH 8-18 (SUTURE) ×3 IMPLANT
SYR 3ML LL SCALE MARK (SYRINGE) IMPLANT
SYR CONTROL 10ML LL (SYRINGE) ×3 IMPLANT
TOWEL GREEN STERILE (TOWEL DISPOSABLE) ×3 IMPLANT
TOWEL GREEN STERILE FF (TOWEL DISPOSABLE) ×3 IMPLANT

## 2024-06-26 NOTE — Anesthesia Procedure Notes (Signed)
 Procedure Name: Intubation Date/Time: 06/26/2024 2:08 PM  Performed by: Tommas Cena SQUIBB, RNPre-anesthesia Checklist: Patient identified, Emergency Drugs available, Suction available and Patient being monitored Patient Re-evaluated:Patient Re-evaluated prior to induction Oxygen Delivery Method: Circle System Utilized Preoxygenation: Pre-oxygenation with 100% oxygen Induction Type: IV induction Ventilation: Mask ventilation without difficulty Laryngoscope Size: Mac and 3 Grade View: Grade II Tube type: Oral Tube size: 7.0 mm Number of attempts: 1 Airway Equipment and Method: Stylet Placement Confirmation: ETT inserted through vocal cords under direct vision, positive ETCO2 and breath sounds checked- equal and bilateral Secured at: 22 cm Tube secured with: Tape Dental Injury: Teeth and Oropharynx as per pre-operative assessment  Comments: Grade 2a view with MAC 3. Preformed by SRNA, CRNA and MDA present for procedure.

## 2024-06-26 NOTE — Transfer of Care (Signed)
 Immediate Anesthesia Transfer of Care Note  Patient: [name]  Procedure(s) Performed: RADIOACTIVE SEED GUIDED AXILLARY SENTINEL LYMPH NODE BIOPSY (Left) BIOPSY, LYMPH NODE, SENTINEL (Left) RADIOACTIVE SEED GUIDED BREAST BIOPSY (Left) REMOVAL PORT-A-CATH (Right: Chest)  Patient Location: PACU  Anesthesia Type:General  Level of Consciousness: awake, alert , and oriented  Airway & Oxygen Therapy: Patient Spontanous Breathing and Patient connected to nasal cannula oxygen  Post-op Assessment: Report given to RN and Post -op Vital signs reviewed and stable  Post vital signs: Reviewed and stable  Last Vitals:  Vitals Value Taken Time  BP    Temp    Pulse    Resp    SpO2      Last Pain:  Vitals:   06/26/24 1220  TempSrc:   PainSc: 9       Patients Stated Pain Goal: 3 (06/26/24 1220)  Complications: No notable events documented.

## 2024-06-26 NOTE — Op Note (Signed)
 Left Breast Radioactive seed localized excisional biopsy, left axillary seed localized deep lymph node excision, left axillary sentinel lymph node mapping and biopsy and port removal  Indications: This patient presents with history of left breast cancer, cTxN1; Metastatic breast cancer consistent with breast cancer, ER +, her 2 negative No breast primary located. 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  Port in place following neoadjuvant chemotherapy  Pre-operative Diagnosis: left breast cancer metastatic to axillary lymph node  Post-operative Diagnosis: Same  Surgeon: ARON SHOULDERS   Assistant: Waddell Collier  Anesthesia: General endotracheal anesthesia  ASA Class: 4  Procedure Details  The patient was seen in the Holding Room. The risks, benefits, complications, treatment options, and expected outcomes were discussed with the patient. The possibilities of bleeding, infection, the need for additional procedures, failure to diagnose a condition, and creating a complication requiring transfusion or operation were discussed with the patient. The patient concurred with the proposed plan, giving informed consent.  The site of surgery properly noted/marked. The patient was taken to Operating Room # 2, identified, and the procedure verified as Left Breast Seed localized  with sentinel lymph node biopsy. The left arm, bilateral breast, and chest were prepped and draped in standard fashion. A Time Out was held and the above information confirmed. The MagTrace was injected into the left subareolar position.  An oblique incision was created below the axillary hairline.  Dissection was carried through the clavipectoral fascia. The axilla was addressed first.  Using a Neoprobe, the node containing the seed was located first.  Then, deep level 2 axillary sentinel nodes were removed.  Counts per second are below.    The background count was 50 cps.  The wound was irrigated.  Hemostasis was achieved with  cautery.  The axillary incision was closed with a 3-0 vicryl deep dermal interrupted sutures and a 4-0 monocryl subcuticular closure.  The excisional biopsy was performed by creating a lateral circumareolar incision near the previously placed radioactive seed.  Dissection was carried down to around the point of maximum signal intensity. The cautery was used to perform the dissection.  Hemostasis was achieved with cautery. The deep edge of the cavity was marked.   The specimen was inked with the margin marker paint kit.    Specimen radiography confirmed inclusion of the mammographic lesion, the clip, and the seed.  The background signal in the breast was zero.  The wound was irrigated and reinspected for hemostasis.  The skin was then closed with 3-0 vicryl in layers and 4-0 monocryl subcuticular suture.    The right sided port was then addressed.  The prior incision was anesthetized with local anesthetic.  The incision was opened with a #15 blade.  The subcutaneous tissue was divided with the cautery.  The port was identified and the capsule opened.  The four 2-0 prolene sutures were removed.  The port was then removed and pressure held on the tract.  The catheter appeared intact without evidence of breakage, length was complete.  The wound was inspected for hemostasis, which was achieved with cautery.  The wound was closed with 3-0 vicryl deep dermal interrupted sutures and 4-0 Monocryl running subcuticular suture.     Sterile dressings were applied. At the end of the operation, all sponge, instrument, and needle counts were correct.  Findings: Four nodes total nodes: #1 with seed, #2, #3, #4- all with T99 and Magtrace signal very elevated.  Highest cps T99 1250, highest Magtrace cps 500.  2.  Breast- anterior margin is  skin, no palpable mass 3.  Port intact  Estimated Blood Loss:  min         Specimens: left axillary node with seed Left Axillary SLN #1- #4 Left breast tissue with seed           Complications:  None; patient tolerated the procedure well.         Disposition: PACU - hemodynamically stable.         Condition: stable

## 2024-06-26 NOTE — Anesthesia Postprocedure Evaluation (Signed)
 Anesthesia Post Note  Patient: Temica M Danko  Procedure(s) Performed: RADIOACTIVE SEED GUIDED AXILLARY SENTINEL LYMPH NODE BIOPSY (Left) BIOPSY, LYMPH NODE, SENTINEL (Left) RADIOACTIVE SEED GUIDED BREAST BIOPSY (Left) REMOVAL PORT-A-CATH (Right: Chest)     Patient location during evaluation: PACU Anesthesia Type: Regional and General Level of consciousness: awake and alert Pain management: pain level controlled Vital Signs Assessment: post-procedure vital signs reviewed and stable Respiratory status: spontaneous breathing, nonlabored ventilation, respiratory function stable and patient connected to nasal cannula oxygen Cardiovascular status: blood pressure returned to baseline and stable Postop Assessment: no apparent nausea or vomiting Anesthetic complications: no   No notable events documented.  Last Vitals:  Vitals:   06/26/24 1630 06/26/24 1645  BP: (!) 115/57 120/63  Pulse: 75 75  Resp: 13 13  Temp:  36.7 C  SpO2: 97% 97%    Last Pain:  Vitals:   06/26/24 1631  TempSrc:   PainSc: 5                  Poonam Woehrle P Jem Castro

## 2024-06-26 NOTE — Discharge Instructions (Addendum)
 [facility] Office Phone Number [phone]  BREAST BIOPSY/ PARTIAL MASTECTOMY: POST OP INSTRUCTIONS  Always review your discharge instruction sheet given to you by the facility where your surgery was performed.  IF YOU HAVE DISABILITY OR FAMILY LEAVE FORMS, YOU MUST BRING THEM TO THE OFFICE FOR PROCESSING.  DO NOT GIVE THEM TO YOUR DOCTOR.  Take 2 tylenol  (acetominophen) three times a day for 3 days.  If you still have pain, add ibuprofen with food in between if able to take this (if you have kidney issues or stomach issues, do not take ibuprofen).  If both of those are not enough, add the narcotic pain pill.  If you find you are needing a lot of this overnight after surgery, call the next morning for a refill.    Prescriptions will not be filled after 5pm or on week-ends. Take your usually prescribed medications unless otherwise directed You should eat very light the first 24 hours after surgery, such as soup, crackers, pudding, etc.  Resume your normal diet the day after surgery. Most patients will experience some swelling and bruising in the breast.  Ice packs and a good support bra will help.  Swelling and bruising can take several days to resolve.  It is common to experience some constipation if taking pain medication after surgery.  Increasing fluid intake and taking a stool softener will usually help or prevent this problem from occurring.  A mild laxative (Milk of Magnesia or Miralax ) should be taken according to package directions if there are no bowel movements after 48 hours. Unless discharge instructions indicate otherwise, you may remove your bandages 48 hours after surgery, and you may shower at that time.  You may have steri-strips (small skin tapes) in place directly over the incision.  These strips should be left on the skin at least for for 7-10 days.    ACTIVITIES:  You may resume regular daily activities (gradually increasing) beginning the next day.  Wearing a  good support bra or sports bra (or the breast binder) minimizes pain and swelling.  You may have sexual intercourse when it is comfortable. No heavy lifting for 1-2 weeks (not over around 10 pounds).  You may drive when you no longer are taking prescription pain medication, you can comfortably wear a seatbelt, and you can safely maneuver your car and apply brakes. RETURN TO WORK:  __________3-14 days depending on job. _______________ [name] should see your doctor in the office for a follow-up appointment approximately two weeks after your surgery.  Your doctor's nurse will typically make your follow-up appointment when she calls you with your pathology report.  Expect your pathology report 3-4 business days after your surgery.  You may call to check if you do not hear from us  after three days.   WHEN TO CALL YOUR DOCTOR: Fever over 101.0 Nausea and/or vomiting. Extreme swelling or bruising. Continued bleeding from incision. Increased pain, redness, or drainage from the incision.  The clinic staff is available to answer your questions during regular business hours.  Please don't hesitate to call and ask to speak to one of the nurses for clinical concerns.  If you have a medical emergency, go to the nearest emergency room or call 911.  A surgeon from [facility] is always on call at the hospital.  For further questions, please visit [email or web address]

## 2024-06-26 NOTE — Anesthesia Procedure Notes (Signed)
 Anesthesia Regional Block: Pectoralis block   Pre-Anesthetic Checklist: , timeout performed,  Correct Patient, Correct Site, Correct Laterality,  Correct Procedure, Correct Position, site marked,  Risks and benefits discussed,  Surgical consent,  Pre-op evaluation,  At surgeon's request and post-op pain management  Laterality: Left  Prep: Maximum Sterile Barrier Precautions used, chloraprep       Needles:  Injection technique: Single-shot  Needle Type: Echogenic Stimulator Needle     Needle Length: 9cm  Needle Gauge: 22     Additional Needles:   Procedures:,,,, ultrasound used (permanent image in chart),,    Narrative:  Start time: 06/26/2024 1:13 PM End time: 06/26/2024 1:16 PM Injection made incrementally with aspirations every 5 mL.  Performed by: Personally  Anesthesiologist: Niels Marien CROME, MD  Additional Notes: Monitors applied. No increased pain on injection. No increased resistance to injection. Injection made in 5cc increments. Good needle visualization. Patient tolerated procedure well.

## 2024-06-27 ENCOUNTER — Encounter (HOSPITAL_COMMUNITY): Payer: Self-pay | Admitting: General Surgery

## 2024-07-02 ENCOUNTER — Other Ambulatory Visit: Payer: Self-pay

## 2024-07-02 ENCOUNTER — Ambulatory Visit (HOSPITAL_COMMUNITY)
Admission: EM | Admit: 2024-07-02 | Discharge: 2024-07-02 | Disposition: A | Discharge status: Home / Self Care | Attending: Family Medicine | Admitting: Family Medicine

## 2024-07-02 ENCOUNTER — Encounter (HOSPITAL_COMMUNITY): Payer: Self-pay

## 2024-07-02 ENCOUNTER — Emergency Department (HOSPITAL_COMMUNITY)
Admission: EM | Admit: 2024-07-02 | Discharge: 2024-07-02 | Disposition: A | Discharge status: Home / Self Care | Attending: Emergency Medicine | Admitting: Emergency Medicine

## 2024-07-02 DIAGNOSIS — Z79899 Other long term (current) drug therapy: Secondary | ICD-10-CM | POA: Diagnosis not present

## 2024-07-02 DIAGNOSIS — G8918 Other acute postprocedural pain: Secondary | ICD-10-CM | POA: Diagnosis present

## 2024-07-02 DIAGNOSIS — E119 Type 2 diabetes mellitus without complications: Secondary | ICD-10-CM | POA: Insufficient documentation

## 2024-07-02 DIAGNOSIS — E876 Hypokalemia: Secondary | ICD-10-CM | POA: Insufficient documentation

## 2024-07-02 DIAGNOSIS — Z7984 Long term (current) use of oral hypoglycemic drugs: Secondary | ICD-10-CM | POA: Insufficient documentation

## 2024-07-02 DIAGNOSIS — Z5189 Encounter for other specified aftercare: Secondary | ICD-10-CM

## 2024-07-02 DIAGNOSIS — Z7951 Long term (current) use of inhaled steroids: Secondary | ICD-10-CM | POA: Insufficient documentation

## 2024-07-02 DIAGNOSIS — J45909 Unspecified asthma, uncomplicated: Secondary | ICD-10-CM | POA: Insufficient documentation

## 2024-07-02 DIAGNOSIS — L7682 Other postprocedural complications of skin and subcutaneous tissue: Secondary | ICD-10-CM

## 2024-07-02 DIAGNOSIS — I10 Essential (primary) hypertension: Secondary | ICD-10-CM | POA: Insufficient documentation

## 2024-07-02 DIAGNOSIS — Z853 Personal history of malignant neoplasm of breast: Secondary | ICD-10-CM | POA: Insufficient documentation

## 2024-07-02 DIAGNOSIS — D649 Anemia, unspecified: Secondary | ICD-10-CM | POA: Insufficient documentation

## 2024-07-02 DIAGNOSIS — Z7982 Long term (current) use of aspirin: Secondary | ICD-10-CM | POA: Diagnosis not present

## 2024-07-02 LAB — CBC WITH DIFFERENTIAL/PLATELET
Abs Immature Granulocytes: 0.02 K/uL (ref 0.00–0.07)
Basophils Absolute: 0 K/uL (ref 0.0–0.1)
Basophils Relative: 0 %
Eosinophils Absolute: 0.1 K/uL (ref 0.0–0.5)
Eosinophils Relative: 1 %
HCT: 38.2 % (ref 36.0–46.0)
Hemoglobin: 11.3 g/dL — ABNORMAL LOW (ref 12.0–15.0)
Immature Granulocytes: 0 %
Lymphocytes Relative: 17 %
Lymphs Abs: 1.4 K/uL (ref 0.7–4.0)
MCH: 20.7 pg — ABNORMAL LOW (ref 26.0–34.0)
MCHC: 29.6 g/dL — ABNORMAL LOW (ref 30.0–36.0)
MCV: 70 fL — ABNORMAL LOW (ref 80.0–100.0)
Monocytes Absolute: 0.4 K/uL (ref 0.1–1.0)
Monocytes Relative: 5 %
Neutro Abs: 6.3 K/uL (ref 1.7–7.7)
Neutrophils Relative %: 77 %
Platelets: 550 K/uL — ABNORMAL HIGH (ref 150–400)
RBC: 5.46 MIL/uL — ABNORMAL HIGH (ref 3.87–5.11)
RDW: 19.8 % — ABNORMAL HIGH (ref 11.5–15.5)
WBC: 8.2 K/uL (ref 4.0–10.5)
nRBC: 0 % (ref 0.0–0.2)

## 2024-07-02 LAB — COMPREHENSIVE METABOLIC PANEL WITH GFR
ALT: 26 U/L (ref 0–44)
AST: 21 U/L (ref 15–41)
Albumin: 3.4 g/dL — ABNORMAL LOW (ref 3.5–5.0)
Alkaline Phosphatase: 111 U/L (ref 38–126)
Anion gap: 15 (ref 5–15)
BUN: 7 mg/dL (ref 6–20)
CO2: 20 mmol/L — ABNORMAL LOW (ref 22–32)
Calcium: 9.1 mg/dL (ref 8.9–10.3)
Chloride: 104 mmol/L (ref 98–111)
Creatinine, Ser: 0.95 mg/dL (ref 0.44–1.00)
GFR, Estimated: >60 mL/min (ref >60)
Glucose, Bld: 143 mg/dL — ABNORMAL HIGH (ref 70–99)
Potassium: 3.4 mmol/L — ABNORMAL LOW (ref 3.5–5.1)
Sodium: 139 mmol/L (ref 135–145)
Total Bilirubin: 0.6 mg/dL (ref 0.0–1.2)
Total Protein: 7.7 g/dL (ref 6.5–8.1)

## 2024-07-02 LAB — I-STAT CG4 LACTIC ACID, ED
Lactic Acid, Venous: 2 mmol/L (ref 0.5–1.9)
Lactic Acid, Venous: 1.7 mmol/L (ref 0.5–1.9)

## 2024-07-02 MED ORDER — LACTATED RINGERS IV BOLUS
1000.0000 mL | Freq: Once | INTRAVENOUS | Status: AC
  Administered 2024-07-02: 1000 mL via INTRAVENOUS

## 2024-07-02 MED ORDER — METOCLOPRAMIDE HCL 5 MG/ML IJ SOLN
10.0000 mg | Freq: Once | INTRAMUSCULAR | Status: AC
  Administered 2024-07-02: 10 mg via INTRAVENOUS
  Filled 2024-07-02: qty 2

## 2024-07-02 MED ORDER — DIPHENHYDRAMINE HCL 50 MG/ML IJ SOLN
12.5000 mg | Freq: Once | INTRAMUSCULAR | Status: AC
  Administered 2024-07-02: 12.5 mg via INTRAVENOUS
  Filled 2024-07-02: qty 1

## 2024-07-02 MED ORDER — ACETAMINOPHEN 325 MG PO TABS
650.0000 mg | ORAL_TABLET | Freq: Once | ORAL | Status: AC
Start: 2024-07-02 — End: 2024-07-02
  Administered 2024-07-02: 650 mg via ORAL
  Filled 2024-07-02: qty 2

## 2024-07-02 NOTE — ED Triage Notes (Addendum)
 Patient states she had her left chest port removed 6 days ago. Patient has swelling and bruising to the left breast area that started today. Patient state she had a left breast lumpectomy at the same time.

## 2024-07-02 NOTE — ED Triage Notes (Signed)
 Pt sent from UC for further eval of post op bleeding and swelling in her left axilla from a port removal and lumpectomy.

## 2024-07-02 NOTE — ED Provider Notes (Signed)
 Here for bleeding and swelling and bruising and pain in her left axilla and breast.  On August 26 she had a port removed and a lumpectomy.  It has been hurting this whole time, but today she noticed bleeding from an incision in her axilla and extra bruising.  No fever noted.  On exam she is very tender in her left axilla along the lateral breast.  There is ecchymosis or erythema of the lateral breast.  The wound appears dehisced in the left axilla and is oozing a little blood.   She is tachycardic here.  With her symptoms and exam, I think she needs to be evaluated in the emergency room on an urgent basis, with evaluation that we cannot provide here in the urgent care setting.  She is agreeable and will go by private car.   Vonna Sharlet POUR, MD 07/02/24 1027

## 2024-07-02 NOTE — Discharge Instructions (Signed)
 She will go to the ED for further eval/tx

## 2024-07-02 NOTE — ED Notes (Signed)
 Patient is being discharged from the Urgent Care and sent to the Emergency Department via POV . Per Dr. Vonna, patient is in need of higher level of care due to need for further imaging and bleeding. Patient is aware and verbalizes understanding of plan of care.  Vitals:   07/02/24 1017  BP: (!) 144/98  Pulse: (!) 116  Resp: 16  Temp: 98.5 F (36.9 C)  SpO2: 100%

## 2024-07-02 NOTE — ED Provider Notes (Signed)
 [place] [facility] AT [facility] Provider Note   CSN: [id number] Arrival date & time: 07/02/24  1109     Patient presents with: Post-op Problem   [name] is a 48 y.o. female who presents to the emergency department from urgent care due to a possible postop complication.  Patient had a lumpectomy as well as a Port-A-Cath removal surgery on 06/26/2024.  Patient states that she tolerated the procedure well with no immediate complications.  States that the area has been sore and bruised, but that she went to the urgent care today because of significant bleeding out of one of her incision sites.  Patient states that she could not get the incision site to stop bleeding and that it was bleeding through her clothing.  Patient also states that she has also felt some weakness as well as lightheadedness.  States that whenever she was walking into the emergency department today she felt weak like I was going to pass out.  She states that this has happened previously as she is a type II diabetic and that this is most often associated with episodes where her blood sugar is low.  Patient also states that she developed a headache yesterday.  She states that the headache did develop somewhat suddenly but denies it being the worst headache of her life. Patient states that she attempted to take her at home Topamax which she has prescribed as needed for headaches but that the headache persisted.  Denies visual disturbances.  Denies issues with coordination or balance different from baseline.  Patient is ambulatory without assistance. States that the headache does feel similar to her headaches or migraines in the past. Patient states that she has also had some reduced intake of foods and fluids as yesterday all she can remember eating or drinking as ginger ale as well as a boiled egg.  She believes that her reduced p.o. intake is due to pain.  Denies chest pain, abdominal pain, nausea, vomiting,  fever, chills.  Denies abnormal drainage from the incision site stating the only normal-looking blood comes out.  Patient states that she has been taking her baseline oxycodone  20 mg as well as oxycodone  5 mg which were given to her as an add-on for acute pain in postoperative setting.  Past medical history significant for morbid obesity, iron  deficiency anemia, panic disorder, depression, sleep apnea, hypertension, asthma, lumbar radiculopathy, palpitations, history of venous thrombosis and embolism, PE, breast cancer metastasized to axillary lymph node, fibroids, type 2 diabetes, migraines, etc.  Patient states that she has follow-up with surgeon on 07/09/2024.   HPI     Prior to Admission medications   Medication Sig Start Date End Date Taking? Authorizing Provider  albuterol  (PROAIR  HFA) 108 (90 Base) MCG/ACT inhaler Use 2 puffs every 4 hours as needed for cough or wheeze.  May use 2 puffs 10-20 minutes prior to exercise. 02/09/16   Asa Aloysius LABOR, MD  aspirin EC 325 MG tablet Take 325 mg by mouth every 4 (four) hours as needed for mild pain (pain score 1-3) or moderate pain (pain score 4-6).    [provider]  cetirizine  (ZYRTEC ) 10 MG tablet Take 10 mg by mouth daily as needed for allergies.    [provider]  cyclobenzaprine (FLEXERIL) 10 MG tablet Take 10 mg by mouth 3 (three) times daily as needed for muscle spasms.    [provider]  escitalopram  (LEXAPRO ) 20 MG tablet Take 20 mg by mouth in  the morning.    [provider]  gabapentin  (NEURONTIN ) 300 MG capsule Take 300 mg by mouth 3 (three) times daily as needed (pain.).    [provider]  glipiZIDE (GLUCOTROL) 10 MG tablet Take 10 mg by mouth 2 (two) times daily as needed (blood glucose greater than 200.).    [provider]  ibuprofen (ADVIL) 200 MG tablet Take 600-800 mg by mouth every 8 (eight) hours as needed (pain.).    [provider]  losartan  (COZAAR ) 100 MG  tablet Take 1 tablet (100 mg total) by mouth daily. 02/02/13   Singh, Prashant K, MD  metoprolol  succinate (TOPROL  XL) 100 MG 24 hr tablet Take 1 tablet (100 mg total) by mouth daily. Take with or immediately following a meal. 01/17/24   Odean Potts, MD  montelukast (SINGULAIR) 10 MG tablet Take 10 mg by mouth daily as needed (allergies.).    [provider]  MOUNJARO 12.5 MG/0.5ML Pen Inject 12.5 mg into the skin every Thursday. 05/29/24   [provider]  oxyCODONE  (OXY IR/ROXICODONE ) 5 MG immediate release tablet Take 1 tablet (5 mg total) by mouth every 6 (six) hours as needed for severe pain (pain score 7-10). 06/26/24   Aron Shoulders, MD  Oxycodone  HCl 20 MG TABS Take 1 tablet by mouth 4 (four) times daily as needed (pain.). 09/05/23   [provider]  SYMBICORT 80-4.5 MCG/ACT inhaler Inhale 2 puffs into the lungs 2 (two) times daily as needed (respiratory issues.). 05/29/24   [provider]  topiramate (TOPAMAX) 50 MG tablet Take 50 mg by mouth at bedtime. 01/19/21   [provider]    Allergies: Doxycycline, Fish allergy, Fish oil, Other, Pollen extract, Citalopram, Amoxicillin , Penicillins, and Strawberry extract    Review of Systems  Skin:  Positive for wound (Incision sites present to left breast and left axilla).    Updated Vital Signs BP 133/77   Pulse 86   Temp 98.2 F (36.8 C) (Oral)   Resp 18   Ht 5' 9 (1.753 m)   Wt (!) 183.7 kg   LMP 02/20/2022 (Exact Date)   SpO2 100%   BMI 59.81 kg/m   Physical Exam Vitals and nursing note reviewed.  Constitutional:      General: She is awake. She is not in acute distress.    Appearance: Normal appearance. She is not ill-appearing, toxic-appearing or diaphoretic.  HENT:     Head: Normocephalic and atraumatic.  Eyes:     General: No scleral icterus. Cardiovascular:     Rate and Rhythm: Normal rate and regular rhythm.  Pulmonary:     Effort: Pulmonary effort is normal. No respiratory  distress.     Breath sounds: No wheezing, rhonchi or rales.  Musculoskeletal:        General: Normal range of motion.     Right lower leg: No edema.     Left lower leg: No edema.  Skin:    General: Skin is warm.     Capillary Refill: Capillary refill takes less than 2 seconds.     Comments: 2 incision sites present, 1 to left breast and 1 to left axillary region  Incision site on left breast looks well-healing, Steri-Strips still in place, no sign of active bleeding, infectious drainage, surrounding erythema, or other signs of infection  Incision site to left axilla also looks well-healing, only 1 Steri-Strip in place, no sign of infection or infectious drainage, no surrounding erythema, however small amount of blood can  be noted seeping out of the incision site, very small area of separation down the incision, no obvious signs of wound dehiscence, based on my exam internal sutures appear intact  Bruising and swelling to the left breast and left axillary region as expected postlumpectomy/surgical procedure  Neurological:     General: No focal deficit present.     Mental Status: She is alert and oriented to person, place, and time.     GCS: GCS eye subscore is 4. GCS verbal subscore is 5. GCS motor subscore is 6.  Psychiatric:        Mood and Affect: Mood normal.        Behavior: Behavior normal. Behavior is cooperative.     (all labs ordered are listed, but only abnormal results are displayed) Labs Reviewed  CBC WITH DIFFERENTIAL/PLATELET - Abnormal; Notable for the following components:      Result Value   RBC 5.46 (*)    Hemoglobin 11.3 (*)    MCV 70.0 (*)    MCH 20.7 (*)    MCHC 29.6 (*)    RDW 19.8 (*)    Platelets 550 (*)    All other components within normal limits  COMPREHENSIVE METABOLIC PANEL WITH GFR - Abnormal; Notable for the following components:   Potassium 3.4 (*)    CO2 20 (*)    Glucose, Bld 143 (*)    Albumin 3.4 (*)    All other components within normal  limits  I-STAT CG4 LACTIC ACID, ED - Abnormal; Notable for the following components:   Lactic Acid, Venous 2.0 (*)    All other components within normal limits  CULTURE, BLOOD (ROUTINE X 2)  CULTURE, BLOOD (ROUTINE X 2)  I-STAT CG4 LACTIC ACID, ED  I-STAT CG4 LACTIC ACID, ED    EKG: None   Radiology: No results found.   .Laceration Repair  Date/Time: 07/02/2024 10:43 PM  Performed by: Janetta Terrall FALCON, PA-C Authorized by: Janetta Terrall FALCON, PA-C   Consent:    Consent obtained:  Verbal   Consent given by:  Patient   Risks, benefits, and alternatives were discussed: yes     Risks discussed:  Infection, pain, need for additional repair and poor cosmetic result   Alternatives discussed:  No treatment and observation Universal protocol:    Procedure explained and questions answered to patient or proxy's satisfaction: yes     Patient identity confirmed:  Verbally with patient and arm band Anesthesia:    Anesthesia method:  None Laceration details:    Location:  Trunk   Trunk location:  L axilla   Length (cm):  3.8 Treatment:    Area cleansed with:  Saline   Amount of cleaning:  Standard Skin repair:    Repair method:  Steri-Strips   Number of Steri-Strips:  6 Approximation:    Approximation:  Close Repair type:    Repair type:  Simple Post-procedure details:    Procedure completion:  Tolerated well, no immediate complications     Medications Ordered in the ED  lactated ringers  bolus 1,000 mL (0 mLs Intravenous Stopped 07/02/24 1350)  acetaminophen  (TYLENOL ) tablet 650 mg (650 mg Oral Given 07/02/24 1257)  metoCLOPramide  (REGLAN ) injection 10 mg (10 mg Intravenous Given 07/02/24 1441)  diphenhydrAMINE  (BENADRYL ) injection 12.5 mg (12.5 mg Intravenous Given 07/02/24 1441)  Medical Decision Making Amount and/or Complexity of Data Reviewed Labs: ordered. ECG/medicine tests: ordered.  Risk OTC drugs. Prescription drug  management.   Patient presents to the ED for concern of possible postop complication, this involves an extensive number of treatment options, and is a complaint that carries with it a high risk of complications and morbidity.  The differential diagnosis includes wound dehiscence, infection, postoperative complication, retained suture, bleeding, etc.   Co morbidities that complicate the patient evaluation  morbid obesity, iron  deficiency anemia, panic disorder, depression, sleep apnea, hypertension, asthma, lumbar radiculopathy, palpitations, history of venous thrombosis and embolism, PE, breast cancer metastasized to axillary lymph node, fibroids, type 2 diabetes, migraines   Additional history obtained:  Chart reviewed which shows patient had recent surgery on 06/26/2024, also reviewed urgent care note where patient was seen earlier today   Lab Tests:  I Ordered, and personally interpreted labs.  The pertinent results include: Lab work significant for hemoglobin of 11.3, anemia is microcytic in nature, CMP significant for slightly decreased potassium at 3.4, glucose of 143, albumin of 3.4, lactic acid originally 2 but improved to 1.7 with fluids, blood cultures ordered   Medicines ordered and prescription drug management:  I ordered medication including Benadryl , Reglan , Tylenol , fluids for headache and suspected dehydration/reduced p.o. intake Reevaluation of the patient after these medicines showed that the patient improved I have reviewed the patients home medicines and have made adjustments as needed   Test Considered:  CT head: Declined at this time as patient is neurologically intact, headache is similar to previous headaches and migraines, no visual disturbances, patient improved after symptomatic treatment here in the emergency department, patient ambulatory without assistance, based off of patient history doubt acute life-threatening process   Critical  Interventions:  None   Problem List / ED Course:  48 year old female went to the urgent care due to possible postop complication, possible wound dehiscence postlumpectomy, presents to the emergency department due to this problem as well as possible tachycardia By time of my assessment vital signs stable, heart rate noted to be 95, initial lab work ordered from triage shows no elevated white blood cell count, slightly decreased hemoglobin at 11.3, CMP significant for glucose of 143, lactic acid of 2 No sign of infection on initial examination, no obvious wound dehiscence, Steri-Strips not in place on incision that patient is stating has been bleeding, suspected the increased lactic acid is due to reduced p.o. intake as patient states that she only had a ginger ale and boiled egg yesterday, no elevated white blood cell count, afebrile, not tachycardic at time of my exam, blood pressure is normotensive, doubt sepsis at this time, we will plan for rehydration with fluids and Tylenol  for headache, per chart review patient has a history of migraines and has been prescribed outpatient topiramate for this, patient denies head trauma or injury, doubt acute life-threatening reason for headache, plan for symptomatic treatment and will reassess Patient rehydrated with fluids, repeat lactic acid decreased to 1.7, low clinical suspicion for sepsis at this time as patient is afebrile, does not have elevated white blood cell count, no known source of infection, and lactic is elevated and the setting of reduced p.o. intake, I see no indication for antibiotics at this time Symptomatically treated headache with improvement in the emergency department Repaired incision site with 6 Steri-Strips, patient has good follow-up with surgeon on 07/09/2024, instructed her to keep this appointment and follow-up with her primary care provider as well Return precautions given Patient  discharged Most likely diagnosis at this time is  bleeding/oozing from surgery site from 06/26/2024, on exam no obvious sign of wound dehiscence or infection, area was repaired with Steri-Strips to try and decrease bleeding, patient instructed on wound care and keeping the site clean and dry, has good follow-up with surgery on 07/09/2024, instructions given on headache as well to follow-up with primary care provider if symptoms persist or worsen, at time of discharge patient currently denies headache after symptomatic treatment in the emergency department, low clinical suspicion for acute life-threatening process related to headache with patient history of headaches and migraines and this feeling similar, not waking her up out of her sleep, possibly somewhat sudden onset however patient unsure, not worst headache of her life   Reevaluation:  After the interventions noted above, I reevaluated the patient and found that they have :improved   Social Determinants of Health:  none   Dispostion:  After consideration of the diagnostic results and the patients response to treatment, I feel that the patient would benefit from discharge and outpatient therapy as described, follow-up with surgery on 07/09/2024 or sooner if symptoms warrant.     Final diagnoses:  Post-op pain  Visit for wound check    ED Discharge Orders     None          Janetta Terrall FALCON, PA-C 07/02/24 2248    Gennaro Bouchard L, DO 07/03/24 [phone]

## 2024-07-02 NOTE — ED Triage Notes (Signed)
 Pt bib pov c/o post op bleeding and swelling from left axilla. Pt states the bleeding look normal just more than expected. Pt endorse weakness and dizziness.  Pt denies fever chills and N/V

## 2024-07-02 NOTE — Discharge Instructions (Addendum)
 It was a pleasure taking care of you today.  Today we evaluated your surgical wound.  There is no sign of infection today, and the internal sutures appear to be holding well.  Your wound was noted to be oozing a small amount of blood, because of this more Steri-Strips were applied to your wound site.  Please continue to keep the wound clean and dry.  Please do not get the Steri-Strips wet as this will make them come off.  Please keep your follow-up appointment on 07/09/2024 with your surgeon.  Please call their office tomorrow and make them aware of your visit today and see if they have any more recommendations.  At this time I believe your bruising and pain are as expected after a breast surgery.  If you experience any of the following symptoms including but not limited to fever, chills, chest pain, shortness of breath, severe bleeding from surgical site, severe swelling, worsening bruising, dizziness, weakness, or other concerning symptom please return to the emergency department or seek further medical evaluation. Please also return if you have signs of infection at the wound site including redness, fever ,chills, infectious drainage, etc.

## 2024-07-02 NOTE — ED Notes (Signed)
Bleeding is controlled at this time.    

## 2024-07-03 ENCOUNTER — Other Ambulatory Visit

## 2024-07-03 ENCOUNTER — Inpatient Hospital Stay: Admitting: Genetic Counselor

## 2024-07-03 ENCOUNTER — Ambulatory Visit: Admitting: Hematology and Oncology

## 2024-07-03 ENCOUNTER — Ambulatory Visit: Payer: Self-pay | Admitting: General Surgery

## 2024-07-04 ENCOUNTER — Encounter: Payer: Self-pay | Admitting: *Deleted

## 2024-07-04 LAB — SURGICAL PATHOLOGY

## 2024-07-07 LAB — CULTURE, BLOOD (ROUTINE X 2)
Culture: NO GROWTH
Culture: NO GROWTH
Special Requests: ADEQUATE

## 2024-07-09 ENCOUNTER — Inpatient Hospital Stay: Attending: Physician Assistant | Admitting: Hematology and Oncology

## 2024-07-09 VITALS — BP 120/89 | HR 99 | Temp 97.1°F | Resp 18 | Wt 398.7 lb

## 2024-07-09 DIAGNOSIS — Z170 Estrogen receptor positive status [ER+]: Secondary | ICD-10-CM | POA: Insufficient documentation

## 2024-07-09 DIAGNOSIS — Z1721 Progesterone receptor positive status: Secondary | ICD-10-CM | POA: Diagnosis not present

## 2024-07-09 DIAGNOSIS — C773 Secondary and unspecified malignant neoplasm of axilla and upper limb lymph nodes: Secondary | ICD-10-CM | POA: Insufficient documentation

## 2024-07-09 DIAGNOSIS — C50912 Malignant neoplasm of unspecified site of left female breast: Secondary | ICD-10-CM | POA: Diagnosis not present

## 2024-07-09 DIAGNOSIS — Z1732 Human epidermal growth factor receptor 2 negative status: Secondary | ICD-10-CM | POA: Insufficient documentation

## 2024-07-09 DIAGNOSIS — Z9221 Personal history of antineoplastic chemotherapy: Secondary | ICD-10-CM | POA: Insufficient documentation

## 2024-07-09 NOTE — Progress Notes (Signed)
 Patient Care Team: Stacy Martinis, MD as PCP - General (Internal Medicine) Odean Potts, MD as Medical Oncologist (Hematology and Oncology) Glean Stephane BROCKS, RN (Inactive) as Oncology Nurse Navigator Tyree Nanetta SAILOR, RN as Oncology Nurse Navigator  DIAGNOSIS:  Encounter Diagnosis  Name Primary?   Breast cancer metastasized to axillary lymph node, left (HCC) Yes    SUMMARY OF ONCOLOGIC HISTORY: Oncology History  Breast cancer metastasized to axillary lymph node, left (HCC)  06/27/2023 Surgery   Left lumpectomy: Grade 2 IDC 0.3 cm, intermediate grade DCIS, 0/4 lymph nodes negative, ER 100%, PR 90%, Ki67 2%, HER2 0 negative   07/12/2023 Initial Diagnosis   Patient palpated a lump in the left axilla.  Mammogram did not reveal any primary breast tumor.  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+   08/15/2023 - 01/17/2024 Chemotherapy   Patient is on Treatment Plan : BREAST ADJUVANT DOSE DENSE AC q14d / PACLitaxel  q7d     04/10/2024 Cancer Staging   Staging form: Breast, AJCC 8th Edition - Clinical: Stage IIA (cT2, cN1, cM0, G2, ER+, PR+, HER2-) - Signed by Odean Potts, MD on 04/10/2024 Histologic grading system: 3 grade system     CHIEF COMPLIANT: Follow-up after recent surgery  HISTORY OF PRESENT ILLNESS: [name] is a 48 year old who finished neoadjuvant chemotherapy and is here after undergoing lumpectomy of the left breast.  She reports that there is slight oozing from the surgical site for which she is using a bandage.  Denies any pain or discomfort denies any fevers or chills. ALLERGIES:  is allergic to doxycycline, fish allergy, fish oil, other, pollen extract, citalopram, amoxicillin , penicillins, and strawberry extract.  MEDICATIONS:  Current Outpatient Medications  Medication Sig Dispense Refill   albuterol  (PROAIR  HFA) 108 (90 Base) MCG/ACT inhaler Use 2 puffs every 4 hours as needed for cough or wheeze.  May use 2 puffs 10-20  minutes prior to exercise. 1 Inhaler 0   aspirin EC 325 MG tablet Take 325 mg by mouth every 4 (four) hours as needed for mild pain (pain score 1-3) or moderate pain (pain score 4-6).     cetirizine  (ZYRTEC ) 10 MG tablet Take 10 mg by mouth daily as needed for allergies.     cyclobenzaprine (FLEXERIL) 10 MG tablet Take 10 mg by mouth 3 (three) times daily as needed for muscle spasms.     escitalopram  (LEXAPRO ) 20 MG tablet Take 20 mg by mouth in the morning.     gabapentin  (NEURONTIN ) 300 MG capsule Take 300 mg by mouth 3 (three) times daily as needed (pain.).     glipiZIDE (GLUCOTROL) 10 MG tablet Take 10 mg by mouth 2 (two) times daily as needed (blood glucose greater than 200.).     ibuprofen (ADVIL) 200 MG tablet Take 600-800 mg by mouth every 8 (eight) hours as needed (pain.).     losartan  (COZAAR ) 100 MG tablet Take 1 tablet (100 mg total) by mouth daily. 30 tablet 3   metoprolol  succinate (TOPROL  XL) 100 MG 24 hr tablet Take 1 tablet (100 mg total) by mouth daily. Take with or immediately following a meal. 30 tablet 6   montelukast (SINGULAIR) 10 MG tablet Take 10 mg by mouth daily as needed (allergies.).     MOUNJARO 12.5 MG/0.5ML Pen Inject 12.5 mg into the skin every Thursday.     Oxycodone  HCl 20 MG TABS Take 1 tablet by mouth 4 (four) times daily as needed (pain.).  SYMBICORT 80-4.5 MCG/ACT inhaler Inhale 2 puffs into the lungs 2 (two) times daily as needed (respiratory issues.).     topiramate (TOPAMAX) 50 MG tablet Take 50 mg by mouth at bedtime.     No current facility-administered medications for this visit.   Facility-Administered Medications Ordered in Other Visits  Medication Dose Route Frequency Provider Last Rate Last Admin   heparin  lock flush 100 unit/mL  500 Units Intracatheter Once Odean Potts, MD       sodium chloride  flush (NS) 0.9 % injection 10 mL  10 mL Intracatheter Once Odean Potts, MD        PHYSICAL EXAMINATION: ECOG PERFORMANCE STATUS: 1 -  Symptomatic but completely ambulatory  Vitals:   07/09/24 1524  BP: 120/89  Pulse: 99  Resp: 18  Temp: (!) 97.1 F (36.2 C)  SpO2: 100%   Filed Weights   07/09/24 1524  Weight: (!) 398 lb 11.2 oz (180.8 kg)      LABORATORY DATA:  I have reviewed the data as listed    Latest Ref Rng & Units 07/02/2024   11:35 AM 06/21/2024    3:00 PM 04/09/2024    9:54 AM  CMP  Glucose 70 - 99 mg/dL 856  881  823   BUN 6 - 20 mg/dL 7  8  11    Creatinine 0.44 - 1.00 mg/dL 9.04  8.94  9.00   Sodium 135 - 145 mmol/L 139  141  141   Potassium 3.5 - 5.1 mmol/L 3.4  3.7  4.1   Chloride 98 - 111 mmol/L 104  104  108   CO2 22 - 32 mmol/L 20  24  27    Calcium 8.9 - 10.3 mg/dL 9.1  9.2  9.0   Total Protein 6.5 - 8.1 g/dL 7.7  7.3  6.8   Total Bilirubin 0.0 - 1.2 mg/dL 0.6  0.4  0.3   Alkaline Phos 38 - 126 U/L 111  108  122   AST 15 - 41 U/L 21  38  16   ALT 0 - 44 U/L 26  50  15     Lab Results  Component Value Date   WBC 8.2 07/02/2024   HGB 11.3 (L) 07/02/2024   HCT 38.2 07/02/2024   MCV 70.0 (L) 07/02/2024   PLT 550 (H) 07/02/2024   NEUTROABS 6.3 07/02/2024    ASSESSMENT & PLAN:  Breast cancer metastasized to axillary lymph node, left (HCC) 07/12/2023: Premenopausal patient palpated a lump in the left axilla. Mammogram did not reveal any primary breast tumor. 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 Aron): Left lumpectomy: Grade 2 IDC 0.3 cm, intermediate grade DCIS, 0/4 lymph nodes negative, ER 100%, PR 90%, Ki67 2%,  HER2 0 negative Adjuvant radiation therapy Adjuvant antiestrogen therapy with CDK inhibitors ------------------------------------------------------------------------------------------------------------------- Current treatment:  Return to clinic after radiation is complete to start antiestrogen therapy I sent a message to Dr. Dewey to schedule a follow-up to discuss radiation    No orders of the defined types were placed in this encounter.  The patient has  a good understanding of the overall plan. she agrees with it. she will call with any problems that may develop before the next visit here. Total time spent: 30 mins including face to face time and time spent for planning, charting and co-ordination of care   Michelle MARLA Chad, MD 07/09/24

## 2024-07-09 NOTE — Assessment & Plan Note (Signed)
 07/12/2023: Premenopausal patient palpated a lump in the left axilla. Mammogram did not reveal any primary breast tumor. 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 Aron): Left lumpectomy: Grade 2 IDC 0.3 cm, intermediate grade DCIS, 0/4 lymph nodes negative, ER 100%, PR 90%, Ki67 2%, HER2 0 negative Adjuvant radiation therapy Adjuvant antiestrogen therapy with CDK inhibitors ------------------------------------------------------------------------------------------------------------------- Current treatment:  Return to clinic after radiation is complete to start antiestrogen therapy

## 2024-07-10 ENCOUNTER — Encounter: Payer: Self-pay | Admitting: *Deleted

## 2024-07-10 DIAGNOSIS — C50912 Malignant neoplasm of unspecified site of left female breast: Secondary | ICD-10-CM

## 2024-07-16 NOTE — Progress Notes (Incomplete)
 Radiation Oncology         [phone] ________________________________  Outpatient Follow Up  Name: [name]        MRN: [id number]  Date of Service: 07/18/2024 DOB: 07-13-1976  RR:[name], Ubaldo, MD  Michelle Potts, MD     REFERRING PHYSICIAN: Odean Potts, MD   DIAGNOSIS: The encounter diagnosis was Breast cancer metastasized to axillary lymph node, left (HCC).   HISTORY OF PRESENT ILLNESS: [name] is a 48 y.o. female  who was planning to pursue weight loss surgery in the [facility] but during her work up a screening mammogram on 06/21/23 showed a mass in the left axilla.  An ultrasound of the breast and axilla on 07/01/23 showed an enrlaged 2.7 cm lymph node in the left axilla, and no abnormalities were seen in the breast, but an MRI was recommended. It is unclear why an MRI was not performed. A biopsy of the lymph node mass on 07/12/23 showed metastatic carcinoma consistent with breast primary and iHC stains confirmed a ductal phenotype. Her tumor was ER positive PR positive HER2 negative.  Ki-67 is not utilized at that facility.  She had a CT chest abdomen pelvis on 07/26/2023 in the [facility] for staging which showed the known left axillary lymph node measuring 17 mm in short axis, no other focal findings of adenopathy or other evidence of distant disease was appreciated, no visible abnormalities were reported in the breast.  A bone scan that day was also negative for metastatic disease.  She was counseled on the rationale for neoadjuvant chemotherapy and underwent Port-A-Cath placement in October 2024 and began systemic treatment on 08/15/2023.  She was to undergo a contrast-enhanced mammogram versus an MRI of the breasts to further workup her breast but it does not appear to have been performed.  She completed 4 cycles of Cytoxan  and Adriamycin  chemotherapy on 09/06/2023 and continued with weekly Taxol  until 01/17/2024.  She did have a mammogram at [facility] on 02/29/2024  that showed a decrease in the known lymph node in the left axilla previously measuring approximately 2.6 cm and at that point measured 1.1 cm.  There were no specific abnormal findings in either breast.  She went for a contrast-enhanced mammogram on 05/22/2024 in the [facility] which showed a small group of calcifications with associated asymmetry in the anterior lateral aspect of the left breast which spanned approximately 1 cm in greatest dimension, there was no enhancement in either breast and she underwent a stereotactic biopsy of the calcifications on 05/28/2024 which showed benign breast tissue with no histologic correlate microcalcifications she returned for a second biopsy on 06/11/2024 and pathology showed focal atypical ductal hyperplasia measuring 1 mm in greatest dimension with fibroadenoma and calcifications in a background of breast tissue with fibrocystic change.  This was felt to be concordant with Dr. Lane review.  Patient returned to the OR to resect her atypical ductal hyperplasia and proceed with node biopsy with Dr. Aron on 06/26/2024.  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  Of her lymph node excision for nodes were removed and all were negative for disease.  Her cancer was ER/PR positive, HER2 negative with a Ki-67 of 2%.  She is seen to discuss adjuvant radiotherapy.   PREVIOUS RADIATION THERAPY: No  PAST MEDICAL HISTORY:  Past Medical History:  Diagnosis Date   Allergic rhinitis    Anemia    Anxiety disorder    Arthritis    Asthma    Benign essential hypertension    Beta thalassemia trait    Depression    Diabetes mellitus without complication (HCC)    Edema    Fatty liver    History of blood clots    Hyperglycemia    Lumbar radiculopathy     Metrorrhagia    Migraines    Morbid obesity (HCC)    OSA (obstructive sleep apnea)    Sickle cell trait (HCC)    Snoring        PAST SURGICAL HISTORY: Past Surgical History:  Procedure Laterality Date   BREAST BIOPSY Left 05/28/2024   MM LT BREAST BX W LOC DEV 1ST LESION IMAGE BX SPEC STEREO GUIDE 05/28/2024 [facility]   BREAST BIOPSY Left 06/11/2024   MM LT BREAST BX W LOC DEV 1ST LESION IMAGE BX SPEC STEREO GUIDE 06/11/2024 [facility]   BREAST BIOPSY  06/26/2024   MM LT RADIOACTIVE SEED EA ADD LESION LOC MAMMO GUIDE 06/26/2024 [facility]   BREAST BIOPSY Left 06/26/2024   US  LT RADIOACTIVE SEED LOC 06/26/2024 [facility]   CESAREAN SECTION     CHOLECYSTECTOMY     IR IMAGING GUIDED PORT INSERTION  08/08/2023   IR US  SOFT TISSUE NECK  09/21/2023   PORT-A-CATH REMOVAL Right 06/26/2024   Procedure: REMOVAL PORT-A-CATH;  Surgeon: Aron Shoulders, MD;  Location: [facility] OR;  Service: General;  Laterality: Right;   RADIOACTIVE SEED GUIDED AXILLARY SENTINEL LYMPH NODE Left 06/26/2024   Procedure: RADIOACTIVE SEED GUIDED AXILLARY SENTINEL LYMPH NODE BIOPSY;  Surgeon: Aron Shoulders, MD;  Location: [facility] OR;  Service: General;  Laterality: Left;   SENTINEL NODE BIOPSY Left 06/26/2024   Procedure: BIOPSY, LYMPH NODE, SENTINEL;  Surgeon: Aron Shoulders, MD;  Location: [facility] OR;  Service: General;  Laterality: Left;  GEN w/PEC BLOCK LEFT SEED TARGETED LYMPH NODE EXCISION SENTINEL LYMPH NODE BIOPSY LEFT BREAST SEED LOCALIZED EXCISIONAL BIOPSY   TUBAL LIGATION       FAMILY HISTORY:  Family History  Problem Relation Age of Onset   Asthma Mother    Hypertension Mother    Leukemia Father    Diabetes Father    Breast cancer Maternal Aunt    Breast cancer Paternal Aunt    Breast cancer Cousin    Diabetes Brother      SOCIAL HISTORY:  reports that she has never smoked. She has never used smokeless tobacco. She reports that she does not currently use alcohol. She reports that she  does not use drugs.  The patient is single and lives in [place].  She has been on disability for several years because of her spinal stenosis.   ALLERGIES: Doxycycline, Fish allergy, Fish oil, Other, Pollen extract, Citalopram, Amoxicillin , Penicillins, and Strawberry extract   MEDICATIONS:  Current Outpatient Medications  Medication Sig Dispense Refill   albuterol  (PROAIR  HFA) 108 (90 Base) MCG/ACT inhaler Use 2 puffs every 4 hours as needed for cough or wheeze.  May use 2 puffs 10-20 minutes prior to exercise. 1 Inhaler 0   aspirin EC 325 MG tablet Take 325 mg by mouth every 4 (four) hours as needed for mild pain (pain score 1-3) or moderate pain (pain score 4-6).     cetirizine  (ZYRTEC ) 10 MG tablet Take 10 mg by mouth daily as needed for allergies.  cyclobenzaprine (FLEXERIL) 10 MG tablet Take 10 mg by mouth 3 (three) times daily as needed for muscle spasms.     escitalopram  (LEXAPRO ) 20 MG tablet Take 20 mg by mouth in the morning.     gabapentin  (NEURONTIN ) 300 MG capsule Take 300 mg by mouth 3 (three) times daily as needed (pain.).     glipiZIDE (GLUCOTROL) 10 MG tablet Take 10 mg by mouth 2 (two) times daily as needed (blood glucose greater than 200.).     ibuprofen (ADVIL) 200 MG tablet Take 600-800 mg by mouth every 8 (eight) hours as needed (pain.).     losartan  (COZAAR ) 100 MG tablet Take 1 tablet (100 mg total) by mouth daily. 30 tablet 3   metoprolol  succinate (TOPROL  XL) 100 MG 24 hr tablet Take 1 tablet (100 mg total) by mouth daily. Take with or immediately following a meal. 30 tablet 6   montelukast (SINGULAIR) 10 MG tablet Take 10 mg by mouth daily as needed (allergies.).     MOUNJARO 12.5 MG/0.5ML Pen Inject 12.5 mg into the skin every Thursday.     Oxycodone  HCl 20 MG TABS Take 1 tablet by mouth 4 (four) times daily as needed (pain.).     SYMBICORT 80-4.5 MCG/ACT inhaler Inhale 2 puffs into the lungs 2 (two) times daily as needed (respiratory issues.).     topiramate  (TOPAMAX) 50 MG tablet Take 50 mg by mouth at bedtime.     No current facility-administered medications for this visit.   Facility-Administered Medications Ordered in Other Visits  Medication Dose Route Frequency Provider Last Rate Last Admin   heparin  lock flush 100 unit/mL  500 Units Intracatheter Once Michelle Potts, MD       sodium chloride  flush (NS) 0.9 % injection 10 mL  10 mL Intracatheter Once Michelle Potts, MD         REVIEW OF SYSTEMS: On review of systems, the patient reports that she is doing much better since finishing chemotherapy.  She is getting some of her energy back, and she has been very happy that she has been able to keep off the 100 pounds that she is lost on her own.  She does plan to revisit options in the future with bariatric surgery as well.  She acknowledges baseline low back pain from spinal stenosis, and has been disabled due to this.  No other complaints are verbalized.     PHYSICAL EXAM:  The patient previously verbalized that she is between 386 and 396 pounds in August 2025. Wt Readings from Last 3 Encounters:  07/09/24 (!) 398 lb 11.2 oz (180.8 kg)  07/02/24 (!) 405 lb (183.7 kg)  06/26/24 (!) 405 lb (183.7 kg)   Temp Readings from Last 3 Encounters:  07/09/24 (!) 97.1 F (36.2 C) (Temporal)  07/02/24 98.2 F (36.8 C) (Oral)  06/26/24 98 F (36.7 C)   BP Readings from Last 3 Encounters:  07/09/24 120/89  07/02/24 133/77  06/26/24 120/63   Pulse Readings from Last 3 Encounters:  07/09/24 99  07/02/24 86  06/26/24 75      ECOG = ***  0 - Asymptomatic (Fully active, able to carry on all predisease activities without restriction)  1 - Symptomatic but completely ambulatory (Restricted in physically strenuous activity but ambulatory and able to carry out work of a light or sedentary nature. For example, light housework, office work)  2 - Symptomatic, <50% in bed during the day (Ambulatory and capable of all self care but unable to  carry  out any work activities. Up and about more than 50% of waking hours)  3 - Symptomatic, >50% in bed, but not bedbound (Capable of only limited self-care, confined to bed or chair 50% or more of waking hours)  4 - Bedbound (Completely disabled. Cannot carry on any self-care. Totally confined to bed or chair)  5 - Death   Raylene MM, Creech RH, Tormey DC, et al. [phone]). Toxicity and response criteria of the [facility]. Am. DOROTHA Bridges. Oncol. 5 (6): 649-55    LABORATORY DATA:  Lab Results  Component Value Date   WBC 8.2 07/02/2024   HGB 11.3 (L) 07/02/2024   HCT 38.2 07/02/2024   MCV 70.0 (L) 07/02/2024   PLT 550 (H) 07/02/2024   Lab Results  Component Value Date   NA 139 07/02/2024   K 3.4 (L) 07/02/2024   CL 104 07/02/2024   CO2 20 (L) 07/02/2024   Lab Results  Component Value Date   ALT 26 07/02/2024   AST 21 07/02/2024   ALKPHOS 111 07/02/2024   BILITOT 0.6 07/02/2024      RADIOGRAPHY: MM Breast Surgical Specimen Result Date: 06/26/2024 CLINICAL DATA:  Evaluate surgical specimen following LEFT breast excision. EXAM: SPECIMEN RADIOGRAPH OF THE LEFT BREAST COMPARISON:  Previous exam(s). FINDINGS: Status post excision of the LEFT breast. The radioactive seed and RIBBON biopsy clip are present and intact. IMPRESSION: Specimen radiograph of the LEFT breast. Electronically Signed   By: Reyes Phi M.D.   On: 06/26/2024 17:01   NM Sentinel Node Inj-No Rpt (Breast) Result Date: 06/26/2024 Lymphoseek was injected by the Nuclear Medicine Technologist for sentinel lymph node localization.   US  LT RADIOACTIVE SEED LOC Result Date: 06/26/2024 CLINICAL DATA:  48 year old female with history of metastatic left breast cancer. Patient initially had a positive left axillary lymph node and more recently had a left breast biopsy demonstrating ADH. EXAM: ULTRASOUND GUIDED RADIOACTIVE SEED LOCALIZATION OF THE LEFT AXILLA COMPARISON:  Previous exam(s). FINDINGS: Patient  presents for radioactive seed localization prior to left axillary dissection. I met with the patient and we discussed the procedure of seed localization including benefits and alternatives. We discussed the high likelihood of a successful procedure. We discussed the risks of the procedure including infection, bleeding, tissue injury and further surgery. We discussed the low dose of radioactivity involved in the procedure. Informed, written consent was given. The usual time-out protocol was performed immediately prior to the procedure. Using ultrasound guidance, sterile technique, 1% lidocaine  and an I-125 radioactive seed, the biopsy lymph node in the left axilla was localized using a lateral approach. The follow-up mammogram images confirm the seed in the expected location and were marked for Dr. Aron. Follow-up survey of the patient confirms presence of the radioactive seed. Order number of I-125 seed:  [id number]. Total activity:  05/31/2024 reference Date: 0.239 mCi The patient tolerated the procedure well and was released from the [facility]. She was given instructions regarding seed removal. IMPRESSION: Radioactive seed localization left breast. No apparent complications. Electronically Signed   By: Inocente Ast M.D.   On: 06/26/2024 12:52   MM LT RAD SEED EA ADD LESION LOC MAMMO Result Date: 06/26/2024 CLINICAL DATA:  48 year old female with history of metastatic left breast cancer. Patient initially had a positive left axillary lymph node and more recently had a left breast biopsy demonstrating ADH. EXAM: MAMMOGRAPHIC GUIDED RADIOACTIVE SEED LOCALIZATION OF THE LEFT BREAST COMPARISON:  Previous exam(s). FINDINGS: Patient presents for radioactive seed localization  prior to left breast excision. I met with the patient and we discussed the procedure of seed localization including benefits and alternatives. We discussed the high likelihood of a successful procedure. We discussed the risks of the  procedure including infection, bleeding, tissue injury and further surgery. We discussed the low dose of radioactivity involved in the procedure. Informed, written consent was given. The usual time-out protocol was performed immediately prior to the procedure. Using mammographic guidance, sterile technique, 1% lidocaine  and an I-125 radioactive seed, the ribbon clip in the outer left breast was localized using a lateral approach. The follow-up mammogram images confirm the seed in the expected location and were marked for Dr. Aron. Follow-up survey of the patient confirms presence of the radioactive seed. Order number of I-125 seed:  [id number]. Total activity: 0.241 mCi reference Date: 05/14/2024 The patient tolerated the procedure well and was released from the [facility]. She was given instructions regarding seed removal. IMPRESSION: Radioactive seed localization left breast. No apparent complications. Electronically Signed   By: Inocente Ast M.D.   On: 06/26/2024 12:50   MM CLIP PLACEMENT LEFT Result Date: 06/26/2024 CLINICAL DATA:  Postprocedure mammogram to confirm C placement. EXAM: DIAGNOSTIC LEFT MAMMOGRAM POST ULTRASOUND-GUIDED RADIOACTIVE SEED PLACEMENT COMPARISON:  Previous exam(s). ACR Breast Density Category a: The breasts are almost entirely fatty. FINDINGS: Mammographic images were obtained following ultrasound-guided radioactive seed placement. These demonstrate appropriate positioning of the radioactive seed in the biopsied lymph node in the left axilla. IMPRESSION: Appropriate location of the radioactive seed in the left axilla. Final Assessment: Post Procedure Mammograms for Seed Placement BI-RADS CATEGORY  12M: Post-Procedure Mammogram for Marker Placement Electronically Signed   By: Inocente Ast M.D.   On: 06/26/2024 12:48   MM Outside Films Mammo Result Date: 06/21/2024 This examination belongs to an outside facility and is stored here for comparison purposes only.  Contact  the originating outside institution for any associated report or interpretation.  MM Outside Films Mammo Result Date: 06/21/2024 This examination belongs to an outside facility and is stored here for comparison purposes only.  Contact the originating outside institution for any associated report or interpretation.  MM Outside Films Mammo Result Date: 06/21/2024 This examination belongs to an outside facility and is stored here for comparison purposes only.  Contact the originating outside institution for any associated report or interpretation.      IMPRESSION/PLAN: 1. Stage [id number], ER/PR positive invasive ductal carcinoma s/p neoadjuvant chemotehrapy with clinical diagnosis of cTxN1M0, ER/PR positive invasive ductal carcinoma involving the left axilla.     Dr. Dewey has reviewed the final pathology findings and  her course to date.  She has been doing well since her surgery. We reviewed that Dr. Dewey recommends external radiotherapy to the left breast and  to the lymph node stations that drain the breast to reduce risks of local recurrence. Dr. Odean anticipates adjuvant antiestrogen therapy to follow. We discussed the risks, benefits, short, and long term effects of radiotherapy, as well as the curative intent, and the patient is interested in proceeding. We reviewed the delivery and logistics of radiotherapy and that Dr. Dewey recommends 6 1/2 weeks of radiotherapy to the left breast and regional nodes with deep inspiration breath hold technique. Written consent is obtained and placed in the chart, a copy was provided to the patient.  She will simulate on 07/24/2024.   In a visit lasting *** minutes, greater than 50% of the time was spent face to face discussing the patient's condition, in  preparation for the discussion, and coordinating the patient's care.     Donald KYM Husband, [facility]    **Disclaimer: This note was dictated with voice recognition software. Similar sounding words can  inadvertently be transcribed and this note may contain transcription errors which may not have been corrected upon publication of note.**

## 2024-07-17 ENCOUNTER — Encounter: Payer: Self-pay | Admitting: *Deleted

## 2024-07-18 ENCOUNTER — Ambulatory Visit: Admitting: Radiation Oncology

## 2024-07-18 ENCOUNTER — Telehealth: Payer: Self-pay | Admitting: Radiation Oncology

## 2024-07-18 ENCOUNTER — Ambulatory Visit

## 2024-07-18 DIAGNOSIS — C50912 Malignant neoplasm of unspecified site of left female breast: Secondary | ICD-10-CM

## 2024-07-18 NOTE — Progress Notes (Incomplete)
 New Breast Cancer Diagnosis: Left Axilla  Patient has completed her chemotherapy and had left radioactive seed guided axillary SLN biopsy 06/26/2024.  She is here today to discuss proceeding with radiation therapy.  Histology per Pathology Report: grade 2, Invasive Ductal Carcinoma with intermediate grade DCIS 06/26/2024  Receptor Status: ER(positive), PR (positive), Her2-neu (negative), Ki-(2%)   Surgeon and surgical plan, if any:  Dr. Aron -Radioactive seed guided axillary SLN biopsy 06/26/2024   Medical oncologist, treatment if any:    Family History of Breast/Ovarian/Prostate Cancer:   Lymphedema issues, if any:      Pain issues, if any:     SAFETY ISSUES: Prior radiation?  Pacemaker/ICD?  Possible current pregnancy? Postmenopausal Is the patient on methotrexate?   Current Complaints / other details:

## 2024-07-18 NOTE — Telephone Encounter (Signed)
 Pt requesting r/s due to double booking appts. 9/17 FUN appt cx, message sent to provider about limited availability the next two weeks. Pt was advised to expect c/b once provider replies.

## 2024-07-18 NOTE — Telephone Encounter (Signed)
 LVM for pt to offer FUN 9/23@8a  per PA Perkins.

## 2024-07-19 ENCOUNTER — Telehealth: Payer: Self-pay | Admitting: Radiation Oncology

## 2024-07-19 NOTE — Telephone Encounter (Signed)
 Unable to LVM to r/s FUN appt.

## 2024-07-20 ENCOUNTER — Telehealth: Payer: Self-pay | Admitting: Radiation Oncology

## 2024-07-20 NOTE — Telephone Encounter (Signed)
 Able to reach pt and schedule FUN via telephone 9/23 per PA McNabb.

## 2024-07-23 NOTE — Progress Notes (Signed)
 New Breast Cancer Diagnosis: Left Axilla  Patient has completed her chemotherapy and had left radioactive seed guided axillary SLN biopsy 06/26/2024.  She is here today to discuss proceeding with radiation therapy.  Histology per Pathology Report: grade 2, Invasive Ductal Carcinoma with intermediate grade DCIS 06/26/2024  Receptor Status: ER(positive), PR (positive), Her2-neu (negative), Ki-(2%)   Surgeon and surgical plan, if any:  Dr. Aron -Radioactive seed guided axillary SLN biopsy 06/26/2024   Medical oncologist, treatment if any:    Family History of Breast/Ovarian/Prostate Cancer:   Lymphedema issues, if any:      Pain issues, if any:     SAFETY ISSUES: Prior radiation?  Pacemaker/ICD?  Possible current pregnancy? Postmenopausal Is the patient on methotrexate?   Current Complaints / other details:

## 2024-07-24 ENCOUNTER — Encounter: Payer: Self-pay | Admitting: Radiation Oncology

## 2024-07-24 ENCOUNTER — Ambulatory Visit
Admission: RE | Admit: 2024-07-24 | Discharge: 2024-07-24 | Disposition: A | Discharge status: Home / Self Care | Source: Ambulatory Visit | Attending: Radiation Oncology | Admitting: Radiation Oncology

## 2024-07-24 ENCOUNTER — Ambulatory Visit: Admitting: Radiation Oncology

## 2024-07-24 DIAGNOSIS — C50912 Malignant neoplasm of unspecified site of left female breast: Secondary | ICD-10-CM

## 2024-07-24 DIAGNOSIS — Z170 Estrogen receptor positive status [ER+]: Secondary | ICD-10-CM

## 2024-07-24 NOTE — Progress Notes (Addendum)
 Radiation Oncology         [phone] ________________________________  Initial Outpatient Consultation - Conducted via telephone at patient request.  I spoke with the patient to conduct this consult visit via telephone. The patient was notified in advance and was offered an in person or telemedicine meeting to allow for face to face communication but instead preferred to proceed with a telephone consult.  ________________________________  Name: KHRISTINE VERNO        MRN: [id number]  Date of Service: 07/24/2024 DOB: 04-17-1976  RR:Azmfjw, Ubaldo, MD  Odean Potts, MD     REFERRING PHYSICIAN: Odean Potts, MD   DIAGNOSIS: The encounter diagnosis was Breast cancer metastasized to axillary lymph node, left (HCC).   HISTORY OF PRESENT ILLNESS: TANESHIA LORENCE is a 48 y.o. female  who was planning to pursue weight loss surgery in the [facility] but during her work up a screening mammogram on 06/21/23 showed a mass in the left axilla.  An ultrasound of the breast and axilla on 07/01/23 showed an enrlaged 2.7 cm lymph node in the left axilla, and no abnormalities were seen in the breast, but an MRI was recommended. It is unclear why an MRI was not performed. A biopsy of the lymph node mass on 07/12/23 showed metastatic carcinoma consistent with breast primary and iHC stains confirmed a ductal phenotype. Her tumor was ER positive PR positive HER2 negative.  Ki-67 is not utilized at that facility.  She had a CT chest abdomen pelvis on 07/26/2023 in the [facility] health system for staging which showed the known left axillary lymph node measuring 17 mm in short axis, no other focal findings of adenopathy or other evidence of distant disease was appreciated, no visible abnormalities were reported in the breast.  A bone scan that day was also negative for metastatic disease.  She was counseled on the rationale for neoadjuvant chemotherapy and underwent Port-A-Cath placement in October 2024 and began systemic  treatment on 08/15/2023.  She was to undergo a contrast-enhanced mammogram versus an MRI of the breasts to further workup her breast but it does not appear to have been performed.  She completed 4 cycles of Cytoxan  and Adriamycin  chemotherapy on 09/06/2023 and continued with weekly Taxol  until 01/17/2024.  She did have a mammogram at [facility] on 02/29/2024 that showed a decrease in the known lymph node in the left axilla previously measuring approximately 2.6 cm and at that point measured 1.1 cm.  There were no specific abnormal findings in either breast.  She went for a contrast-enhanced mammogram on 05/22/2024 in the [facility] system which showed a small group of calcifications with associated asymmetry in the anterior lateral aspect of the left breast which spanned approximately 1 cm in greatest dimension, there was no enhancement in either breast and she underwent a stereotactic biopsy of the calcifications on 05/28/2024 which showed benign breast tissue with no histologic correlate microcalcifications she returned for a second biopsy on 06/11/2024 and pathology showed focal atypical ductal hyperplasia measuring 1 mm in greatest dimension with fibroadenoma and calcifications in a background of breast tissue with fibrocystic change.  This was felt to be concordant with Dr. Lane review.    All of these results and findings, she was counseled on left lumpectomy and left sentinel lymph node biopsy which she underwent on 06/26/2024 with Dr. Aron.  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  She is ready to proceed with adjuvant radiation and contacted by phone today to review these results.    PREVIOUS RADIATION THERAPY: No   PAST  MEDICAL HISTORY:  Past Medical History:  Diagnosis Date   Allergic rhinitis    Anemia    Anxiety disorder    Arthritis    Asthma    Benign essential hypertension    Beta thalassemia trait    Depression    Diabetes mellitus without complication (HCC)    Edema    Fatty liver    History of blood clots    Hyperglycemia    Lumbar radiculopathy    Metrorrhagia    Migraines    Morbid obesity (HCC)    OSA (obstructive sleep apnea)    Sickle cell trait    Snoring        PAST SURGICAL HISTORY: Past Surgical History:  Procedure Laterality Date   BREAST BIOPSY Left 05/28/2024   MM LT BREAST BX W LOC DEV 1ST LESION IMAGE BX SPEC STEREO GUIDE 05/28/2024 [facility]   BREAST BIOPSY Left 06/11/2024   MM LT BREAST BX W LOC DEV 1ST LESION IMAGE BX SPEC STEREO GUIDE 06/11/2024 [facility]   BREAST BIOPSY  06/26/2024   MM LT RADIOACTIVE SEED EA ADD LESION LOC MAMMO GUIDE 06/26/2024 [facility]   BREAST BIOPSY Left 06/26/2024   US  LT RADIOACTIVE SEED LOC 06/26/2024 [facility]   CESAREAN SECTION     CHOLECYSTECTOMY     IR IMAGING GUIDED PORT INSERTION  08/08/2023   IR US  SOFT TISSUE NECK  09/21/2023   PORT-A-CATH REMOVAL Right 06/26/2024   Procedure: REMOVAL PORT-A-CATH;  Surgeon: Aron Shoulders, MD;  Location: [facility] OR;  Service: General;  Laterality: Right;   RADIOACTIVE SEED GUIDED AXILLARY SENTINEL LYMPH NODE Left 06/26/2024   Procedure: RADIOACTIVE SEED GUIDED AXILLARY SENTINEL LYMPH NODE BIOPSY;  Surgeon: Aron Shoulders, MD;  Location: [facility] OR;  Service: General;  Laterality: Left;   SENTINEL NODE BIOPSY Left 06/26/2024   Procedure: BIOPSY, LYMPH NODE, SENTINEL;  Surgeon: Aron Shoulders, MD;  Location: [facility] OR;  Service: General;  Laterality: Left;  GEN w/PEC BLOCK LEFT SEED TARGETED LYMPH NODE EXCISION SENTINEL LYMPH NODE BIOPSY LEFT BREAST SEED LOCALIZED EXCISIONAL BIOPSY   TUBAL LIGATION       FAMILY HISTORY:  Family History  Problem Relation Age of Onset   Asthma  Mother    Hypertension Mother    Leukemia Father    Diabetes Father    Breast cancer Maternal Aunt    Breast cancer Paternal Aunt    Breast cancer Cousin    Diabetes Brother      SOCIAL HISTORY:  reports that she has never smoked. She has never used smokeless tobacco. She reports that she does not currently use alcohol. She reports that she does not use drugs.  The patient is single and lives in [place].  She has been on disability for several years because of her spinal stenosis.   ALLERGIES: Doxycycline, Fish allergy, Fish oil, Other, Pollen extract, Citalopram, Amoxicillin , Penicillins, and Strawberry extract   MEDICATIONS:  Current Outpatient Medications  Medication Sig Dispense Refill   albuterol  (PROAIR  HFA) 108 (90 Base) MCG/ACT inhaler Use 2 puffs every 4 hours as needed for cough or wheeze.  May use 2 puffs 10-20 minutes prior to  exercise. 1 Inhaler 0   aspirin EC 325 MG tablet Take 325 mg by mouth every 4 (four) hours as needed for mild pain (pain score 1-3) or moderate pain (pain score 4-6).     cetirizine  (ZYRTEC ) 10 MG tablet Take 10 mg by mouth daily as needed for allergies.     cyclobenzaprine (FLEXERIL) 10 MG tablet Take 10 mg by mouth 3 (three) times daily as needed for muscle spasms.     escitalopram  (LEXAPRO ) 20 MG tablet Take 20 mg by mouth in the morning.     gabapentin  (NEURONTIN ) 300 MG capsule Take 300 mg by mouth 3 (three) times daily as needed (pain.).     glipiZIDE (GLUCOTROL) 10 MG tablet Take 10 mg by mouth 2 (two) times daily as needed (blood glucose greater than 200.).     ibuprofen (ADVIL) 200 MG tablet Take 600-800 mg by mouth every 8 (eight) hours as needed (pain.).     losartan  (COZAAR ) 100 MG tablet Take 1 tablet (100 mg total) by mouth daily. 30 tablet 3   metoprolol  succinate (TOPROL  XL) 100 MG 24 hr tablet Take 1 tablet (100 mg total) by mouth daily. Take with or immediately following a meal. 30 tablet 6   montelukast (SINGULAIR) 10 MG tablet  Take 10 mg by mouth daily as needed (allergies.).     MOUNJARO 12.5 MG/0.5ML Pen Inject 12.5 mg into the skin every Thursday.     Oxycodone  HCl 20 MG TABS Take 1 tablet by mouth 4 (four) times daily as needed (pain.).     SYMBICORT 80-4.5 MCG/ACT inhaler Inhale 2 puffs into the lungs 2 (two) times daily as needed (respiratory issues.).     topiramate (TOPAMAX) 50 MG tablet Take 50 mg by mouth at bedtime.     No current facility-administered medications for this encounter.   Facility-Administered Medications Ordered in Other Encounters  Medication Dose Route Frequency Provider Last Rate Last Admin   heparin  lock flush 100 unit/mL  500 Units Intracatheter Once Odean Potts, MD       sodium chloride  flush (NS) 0.9 % injection 10 mL  10 mL Intracatheter Once Odean Potts, MD         REVIEW OF SYSTEMS: On review of systems, the patient reports she is doing okay. She is still having bloody drainage from her incision site and struggles with her breast heaviness and size. She reports she did not have any type of support bra from surgery or met with second to nature. She sees Dr. Aron today but feels like she has some progress to make in her healing. She reports bruising and redness over the site without fever or purulent changes. No other complaints are verbalized.      PHYSICAL EXAM:  Unable to assess given encounter type.     ECOG = 1  0 - Asymptomatic (Fully active, able to carry on all predisease activities without restriction)  1 - Symptomatic but completely ambulatory (Restricted in physically strenuous activity but ambulatory and able to carry out work of a light or sedentary nature. For example, light housework, office work)  2 - Symptomatic, <50% in bed during the day (Ambulatory and capable of all self care but unable to carry out any work activities. Up and about more than 50% of waking hours)  3 - Symptomatic, >50% in bed, but not bedbound (Capable of only limited self-care,  confined to bed or chair 50% or more of waking hours)  4 - Bedbound (Completely disabled. Cannot  carry on any self-care. Totally confined to bed or chair)  5 - Death   Raylene MM, Creech RH, Tormey DC, et al. [phone]). Toxicity and response criteria of the [facility]. Am. DOROTHA Bridges. Oncol. 5 (6): 649-55    LABORATORY DATA:  Lab Results  Component Value Date   WBC 8.2 07/02/2024   HGB 11.3 (L) 07/02/2024   HCT 38.2 07/02/2024   MCV 70.0 (L) 07/02/2024   PLT 550 (H) 07/02/2024   Lab Results  Component Value Date   NA 139 07/02/2024   K 3.4 (L) 07/02/2024   CL 104 07/02/2024   CO2 20 (L) 07/02/2024   Lab Results  Component Value Date   ALT 26 07/02/2024   AST 21 07/02/2024   ALKPHOS 111 07/02/2024   BILITOT 0.6 07/02/2024      RADIOGRAPHY: MM Breast Surgical Specimen Result Date: 06/26/2024 CLINICAL DATA:  Evaluate surgical specimen following LEFT breast excision. EXAM: SPECIMEN RADIOGRAPH OF THE LEFT BREAST COMPARISON:  Previous exam(s). FINDINGS: Status post excision of the LEFT breast. The radioactive seed and RIBBON biopsy clip are present and intact. IMPRESSION: Specimen radiograph of the LEFT breast. Electronically Signed   By: Reyes Phi M.D.   On: 06/26/2024 17:01   NM Sentinel Node Inj-No Rpt (Breast) Result Date: 06/26/2024 Lymphoseek was injected by the Nuclear Medicine Technologist for sentinel lymph node localization.   US  LT RADIOACTIVE SEED LOC Result Date: 06/26/2024 CLINICAL DATA:  48 year old female with history of metastatic left breast cancer. Patient initially had a positive left axillary lymph node and more recently had a left breast biopsy demonstrating ADH. EXAM: ULTRASOUND GUIDED RADIOACTIVE SEED LOCALIZATION OF THE LEFT AXILLA COMPARISON:  Previous exam(s). FINDINGS: Patient presents for radioactive seed localization prior to left axillary dissection. I met with the patient and we discussed the procedure of seed localization  including benefits and alternatives. We discussed the high likelihood of a successful procedure. We discussed the risks of the procedure including infection, bleeding, tissue injury and further surgery. We discussed the low dose of radioactivity involved in the procedure. Informed, written consent was given. The usual time-out protocol was performed immediately prior to the procedure. Using ultrasound guidance, sterile technique, 1% lidocaine  and an I-125 radioactive seed, the biopsy lymph node in the left axilla was localized using a lateral approach. The follow-up mammogram images confirm the seed in the expected location and were marked for Dr. Aron. Follow-up survey of the patient confirms presence of the radioactive seed. Order number of I-125 seed:  [id number]. Total activity:  05/31/2024 reference Date: 0.239 mCi The patient tolerated the procedure well and was released from the Breast Center. She was given instructions regarding seed removal. IMPRESSION: Radioactive seed localization left breast. No apparent complications. Electronically Signed   By: Inocente Ast M.D.   On: 06/26/2024 12:52   MM LT RAD SEED EA ADD LESION LOC MAMMO Result Date: 06/26/2024 CLINICAL DATA:  48 year old female with history of metastatic left breast cancer. Patient initially had a positive left axillary lymph node and more recently had a left breast biopsy demonstrating ADH. EXAM: MAMMOGRAPHIC GUIDED RADIOACTIVE SEED LOCALIZATION OF THE LEFT BREAST COMPARISON:  Previous exam(s). FINDINGS: Patient presents for radioactive seed localization prior to left breast excision. I met with the patient and we discussed the procedure of seed localization including benefits and alternatives. We discussed the high likelihood of a successful procedure. We discussed the risks of the procedure including infection, bleeding, tissue injury and further surgery. We discussed  the low dose of radioactivity involved in the procedure. Informed,  written consent was given. The usual time-out protocol was performed immediately prior to the procedure. Using mammographic guidance, sterile technique, 1% lidocaine  and an I-125 radioactive seed, the ribbon clip in the outer left breast was localized using a lateral approach. The follow-up mammogram images confirm the seed in the expected location and were marked for Dr. Aron. Follow-up survey of the patient confirms presence of the radioactive seed. Order number of I-125 seed:  [id number]. Total activity: 0.241 mCi reference Date: 05/14/2024 The patient tolerated the procedure well and was released from the Breast Center. She was given instructions regarding seed removal. IMPRESSION: Radioactive seed localization left breast. No apparent complications. Electronically Signed   By: Inocente Ast M.D.   On: 06/26/2024 12:50   MM CLIP PLACEMENT LEFT Result Date: 06/26/2024 CLINICAL DATA:  Postprocedure mammogram to confirm C placement. EXAM: DIAGNOSTIC LEFT MAMMOGRAM POST ULTRASOUND-GUIDED RADIOACTIVE SEED PLACEMENT COMPARISON:  Previous exam(s). ACR Breast Density Category a: The breasts are almost entirely fatty. FINDINGS: Mammographic images were obtained following ultrasound-guided radioactive seed placement. These demonstrate appropriate positioning of the radioactive seed in the biopsied lymph node in the left axilla. IMPRESSION: Appropriate location of the radioactive seed in the left axilla. Final Assessment: Post Procedure Mammograms for Seed Placement BI-RADS CATEGORY  33M: Post-Procedure Mammogram for Marker Placement Electronically Signed   By: Inocente Ast M.D.   On: 06/26/2024 12:48       IMPRESSION/PLAN: 1. Stage [id number], ER/PR positive invasive ductal carcinoma involving the left axilla with ypT1aN0 disease following neoadjuvant chemotherapy of the left breast. Dr. Dewey again has reviewed the patient's course to date.  It appeared that they were able to isolate the location of what  was felt to be the original site of her cancer in the left breast which has now been removed from her lumpectomy.  She has clear margins and her nodes have converted to negative with her prior systemic chemotherapy.  We reviewed the results of her pathology and we also discussed outcome data from NSABP B-51 trial and she meets criteria to consider avoiding radiation. However given her young age, and timeline of this outcome data, it would be very reasonable to proceed with radiotherapy.  At the end of our discussion, the patient is motivated to proceed with adjuvant radiation to the left breast and regional nodes over 6 1/2 weeks. Dr. Odean anticipates adjuvant antiestrogen therapy to follow. We discussed the risks, benefits, short, and long term effects of radiotherapy, as well as the curative intent, and the patient is interested in proceeding. She will be rescheduled for simulation due to her ongoing healing and she will be contacted by our team to tentatively plan for this the week of 08/06/24.   This encounter was conducted via telephone.  The patient has provided two factor identification and has given verbal consent for this type of encounter and has been advised to only accept a meeting of this type in a secure network environment. The time spent during this encounter was 45 minutes including preparation, discussion, and coordination of the patient's care. The attendants for this meeting include Sharene Cary, RN, Donald Estefana Husband  and Jyl HERO Lessley.  During the encounter,  Sharene Cary, RN and Donald Estefana Husband were located at [facility] Radiation Oncology Department.  Charlett M Lindamood was located at home.         Donald KYM Husband, [facility]    **Disclaimer: This note  was dictated with voice recognition software. Similar sounding words can inadvertently be transcribed and this note may contain transcription errors which may not have been corrected upon publication of  note.**Since her last visit, the patient has proceeded with

## 2024-07-27 ENCOUNTER — Other Ambulatory Visit

## 2024-07-27 ENCOUNTER — Encounter

## 2024-07-31 ENCOUNTER — Encounter: Payer: Self-pay | Admitting: *Deleted

## 2024-08-08 ENCOUNTER — Ambulatory Visit: Admitting: Radiation Oncology

## 2024-08-14 ENCOUNTER — Encounter: Payer: Self-pay | Admitting: *Deleted

## 2024-08-15 ENCOUNTER — Other Ambulatory Visit: Payer: Self-pay

## 2024-08-15 ENCOUNTER — Emergency Department (HOSPITAL_COMMUNITY)
Admission: EM | Admit: 2024-08-15 | Discharge: 2024-08-15 | Discharge status: Against Medical Advice | Attending: Emergency Medicine | Admitting: Emergency Medicine

## 2024-08-15 ENCOUNTER — Encounter (HOSPITAL_COMMUNITY): Payer: Self-pay | Admitting: *Deleted

## 2024-08-15 DIAGNOSIS — N644 Mastodynia: Secondary | ICD-10-CM | POA: Diagnosis not present

## 2024-08-15 DIAGNOSIS — Z5321 Procedure and treatment not carried out due to patient leaving prior to being seen by health care provider: Secondary | ICD-10-CM | POA: Insufficient documentation

## 2024-08-15 DIAGNOSIS — Y838 Other surgical procedures as the cause of abnormal reaction of the patient, or of later complication, without mention of misadventure at the time of the procedure: Secondary | ICD-10-CM | POA: Diagnosis not present

## 2024-08-15 DIAGNOSIS — T8189XA Other complications of procedures, not elsewhere classified, initial encounter: Secondary | ICD-10-CM | POA: Insufficient documentation

## 2024-08-15 LAB — CBC WITH DIFFERENTIAL/PLATELET
Abs Immature Granulocytes: 0.06 K/uL (ref 0.00–0.07)
Basophils Absolute: 0 K/uL (ref 0.0–0.1)
Basophils Relative: 0 %
Eosinophils Absolute: 0.2 K/uL (ref 0.0–0.5)
Eosinophils Relative: 1 %
HCT: 32.5 % — ABNORMAL LOW (ref 36.0–46.0)
Hemoglobin: 9.5 g/dL — ABNORMAL LOW (ref 12.0–15.0)
Immature Granulocytes: 1 %
Lymphocytes Relative: 16 %
Lymphs Abs: 2 K/uL (ref 0.7–4.0)
MCH: 20.5 pg — ABNORMAL LOW (ref 26.0–34.0)
MCHC: 29.2 g/dL — ABNORMAL LOW (ref 30.0–36.0)
MCV: 70 fL — ABNORMAL LOW (ref 80.0–100.0)
Monocytes Absolute: 1 K/uL (ref 0.1–1.0)
Monocytes Relative: 8 %
Neutro Abs: 9.7 K/uL — ABNORMAL HIGH (ref 1.7–7.7)
Neutrophils Relative %: 74 %
Platelets: 503 K/uL — ABNORMAL HIGH (ref 150–400)
RBC: 4.64 MIL/uL (ref 3.87–5.11)
RDW: 19.5 % — ABNORMAL HIGH (ref 11.5–15.5)
WBC: 13 K/uL — ABNORMAL HIGH (ref 4.0–10.5)
nRBC: 0.2 % (ref 0.0–0.2)

## 2024-08-15 LAB — COMPREHENSIVE METABOLIC PANEL WITH GFR
ALT: 11 U/L (ref 0–44)
AST: 17 U/L (ref 15–41)
Albumin: 2.6 g/dL — ABNORMAL LOW (ref 3.5–5.0)
Alkaline Phosphatase: 125 U/L (ref 38–126)
Anion gap: 16 — ABNORMAL HIGH (ref 5–15)
BUN: 11 mg/dL (ref 6–20)
CO2: 22 mmol/L (ref 22–32)
Calcium: 8.7 mg/dL — ABNORMAL LOW (ref 8.9–10.3)
Chloride: 98 mmol/L (ref 98–111)
Creatinine, Ser: 1.12 mg/dL — ABNORMAL HIGH (ref 0.44–1.00)
GFR, Estimated: >60 mL/min (ref >60)
Glucose, Bld: 224 mg/dL — ABNORMAL HIGH (ref 70–99)
Potassium: 3.7 mmol/L (ref 3.5–5.1)
Sodium: 136 mmol/L (ref 135–145)
Total Bilirubin: 0.8 mg/dL (ref 0.0–1.2)
Total Protein: 7.1 g/dL (ref 6.5–8.1)

## 2024-08-15 NOTE — ED Triage Notes (Signed)
 Lt breast red and swollen very hard

## 2024-08-15 NOTE — ED Triage Notes (Addendum)
 Pt came in via POV d/t the surgical area that she had a lumpectomy & partial mastectomy procedure done on her Lt breast has developed a wound that she felt begin to drain & the wound is open. A/Ox4, rates her pain 6/10 upon arrival to ED.

## 2024-08-15 NOTE — ED Notes (Signed)
 ABD Pad placed under left breast for pt d/t ongoing drainage. Secured with perforated tape.

## 2024-08-15 NOTE — ED Provider Triage Note (Signed)
 Emergency Medicine Provider Triage Evaluation Note  [name] , a 48 y.o. female  was evaluated in triage.  Pt complains of breast pain.  Review of Systems  Positive:  Negative:   Physical Exam  BP 97/79 (BP Location: Left Wrist)   Pulse 85   Temp 97.9 F (36.6 C) (Oral)   Resp 19   Ht 5' 9 (1.753 m)   Wt (!) 180.8 kg   LMP 02/20/2022 (Exact Date)   SpO2 97%   BMI 58.86 kg/m  Gen:   Awake, no distress   Resp:  Normal effort  MSK:   Moves extremities without difficulty  Other:    Medical Decision Making  Medically screening exam initiated at 7:55 PM.  Appropriate orders placed.  Paul Torpey Dutton was informed that the remainder of the evaluation will be completed by another provider, this initial triage assessment does not replace that evaluation, and the importance of remaining in the ED until their evaluation is complete.  H/O breast CA, recent lumpectomy, last seen by Dr. Aron one week ago. Since that time, the areas where nodes were removed are open (axilla) and draining (nipple). There is erythema over most of the breast surface. No fever. S/P chemo, pending radiation after surgical wounds heal.    Odell Balls, PA-C 08/15/24 1959

## 2024-08-15 NOTE — ED Notes (Signed)
 Pt states she is leaving ED due to wait time. Pt called self uber and wheeled out of ED by this NT.

## 2024-08-17 ENCOUNTER — Emergency Department (HOSPITAL_COMMUNITY)
Admission: EM | Admit: 2024-08-17 | Discharge: 2024-08-17 | Disposition: A | Discharge status: Home / Self Care | Attending: Emergency Medicine | Admitting: Emergency Medicine

## 2024-08-17 ENCOUNTER — Encounter (HOSPITAL_COMMUNITY): Payer: Self-pay | Admitting: Emergency Medicine

## 2024-08-17 ENCOUNTER — Other Ambulatory Visit: Payer: Self-pay

## 2024-08-17 DIAGNOSIS — I10 Essential (primary) hypertension: Secondary | ICD-10-CM | POA: Diagnosis not present

## 2024-08-17 DIAGNOSIS — Z7982 Long term (current) use of aspirin: Secondary | ICD-10-CM | POA: Insufficient documentation

## 2024-08-17 DIAGNOSIS — Z853 Personal history of malignant neoplasm of breast: Secondary | ICD-10-CM | POA: Insufficient documentation

## 2024-08-17 DIAGNOSIS — Z79899 Other long term (current) drug therapy: Secondary | ICD-10-CM | POA: Insufficient documentation

## 2024-08-17 DIAGNOSIS — N61 Mastitis without abscess: Secondary | ICD-10-CM | POA: Diagnosis present

## 2024-08-17 LAB — CBC WITH DIFFERENTIAL/PLATELET
Abs Immature Granulocytes: 0.05 K/uL (ref 0.00–0.07)
Basophils Absolute: 0 K/uL (ref 0.0–0.1)
Basophils Relative: 0 %
Eosinophils Absolute: 0 K/uL (ref 0.0–0.5)
Eosinophils Relative: 0 %
HCT: 33.2 % — ABNORMAL LOW (ref 36.0–46.0)
Hemoglobin: 9.5 g/dL — ABNORMAL LOW (ref 12.0–15.0)
Immature Granulocytes: 1 %
Lymphocytes Relative: 10 %
Lymphs Abs: 0.9 K/uL (ref 0.7–4.0)
MCH: 19.7 pg — ABNORMAL LOW (ref 26.0–34.0)
MCHC: 28.6 g/dL — ABNORMAL LOW (ref 30.0–36.0)
MCV: 68.9 fL — ABNORMAL LOW (ref 80.0–100.0)
Monocytes Absolute: 0.4 K/uL (ref 0.1–1.0)
Monocytes Relative: 4 %
Neutro Abs: 7.5 K/uL (ref 1.7–7.7)
Neutrophils Relative %: 85 %
Platelets: 516 K/uL — ABNORMAL HIGH (ref 150–400)
RBC: 4.82 MIL/uL (ref 3.87–5.11)
RDW: 19.1 % — ABNORMAL HIGH (ref 11.5–15.5)
Smear Review: NORMAL
WBC: 8.9 K/uL (ref 4.0–10.5)
nRBC: 0.2 % (ref 0.0–0.2)

## 2024-08-17 LAB — COMPREHENSIVE METABOLIC PANEL WITH GFR
ALT: 9 U/L (ref 0–44)
AST: 13 U/L — ABNORMAL LOW (ref 15–41)
Albumin: 3.5 g/dL (ref 3.5–5.0)
Alkaline Phosphatase: 134 U/L — ABNORMAL HIGH (ref 38–126)
Anion gap: 11 (ref 5–15)
BUN: 7 mg/dL (ref 6–20)
CO2: 27 mmol/L (ref 22–32)
Calcium: 9.4 mg/dL (ref 8.9–10.3)
Chloride: 98 mmol/L (ref 98–111)
Creatinine, Ser: 0.71 mg/dL (ref 0.44–1.00)
GFR, Estimated: >60 mL/min (ref >60)
Glucose, Bld: 254 mg/dL — ABNORMAL HIGH (ref 70–99)
Potassium: 3.6 mmol/L (ref 3.5–5.1)
Sodium: 135 mmol/L (ref 135–145)
Total Bilirubin: 0.5 mg/dL (ref 0.0–1.2)
Total Protein: 7.6 g/dL (ref 6.5–8.1)

## 2024-08-17 LAB — I-STAT CG4 LACTIC ACID, ED: Lactic Acid, Venous: 1.4 mmol/L (ref 0.5–1.9)

## 2024-08-17 MED ORDER — CLINDAMYCIN HCL 300 MG PO CAPS
300.0000 mg | ORAL_CAPSULE | Freq: Once | ORAL | Status: AC
  Administered 2024-08-17: 300 mg via ORAL
  Filled 2024-08-17: qty 1

## 2024-08-17 MED ORDER — CLINDAMYCIN HCL 300 MG PO CAPS: 300.0000 mg | ORAL_CAPSULE | Freq: Four times a day (QID) | ORAL | 0 refills | Status: AC

## 2024-08-17 NOTE — ED Triage Notes (Signed)
 Pt presents having had left breast lumpectomy in August.  Pt reports 2 days ago wound began draining, no fever, some nausea.  Drainage noted to left breast in triage.

## 2024-08-17 NOTE — Discharge Instructions (Signed)
 Your exam suggests an infection in the breast tissue.  Start taking the antibiotics as prescribed.  I also recommend follow-up with your surgeon and evaluation at the breast center.  Return to the emergency room if you have worsening symptoms including high fevers, increasing pain over the weekend

## 2024-08-17 NOTE — ED Provider Notes (Signed)
 [place] EMERGENCY DEPARTMENT AT [facility] Provider Note   CSN: [id number] Arrival date & time: 08/17/24  1305     Patient presents with: Post-op Problem   Chosen [name] is a 48 y.o. female.   HPI    Patient has a history of hypertension elevated BMI, depression breast cancer.  Patient has been undergoing chemotherapy treatment for breast cancer.  Patient has been following up with Dr. Aron as she is continue to have some swelling and edema of her breast area.  Per notes patient does have a large seroma cavity.  Patient states she came into the ED today because she started noticing some increasing drainage.  She has not had any fevers or chills.  No severe pain Prior to Admission medications   Medication Sig Start Date End Date Taking? Authorizing Provider  clindamycin (CLEOCIN) 300 MG capsule Take 1 capsule (300 mg total) by mouth 4 (four) times daily for 7 days. 08/17/24 08/24/24 Yes Randol Simmonds, MD  albuterol  (PROAIR  HFA) 108 [phone] Base) MCG/ACT inhaler Use 2 puffs every 4 hours as needed for cough or wheeze.  May use 2 puffs 10-20 minutes prior to exercise. 02/09/16   Asa Aloysius LABOR, MD  aspirin EC 325 MG tablet Take 325 mg by mouth every 4 (four) hours as needed for mild pain (pain score 1-3) or moderate pain (pain score 4-6).    [provider]  cetirizine  (ZYRTEC ) 10 MG tablet Take 10 mg by mouth daily as needed for allergies.    [provider]  cyclobenzaprine (FLEXERIL) 10 MG tablet Take 10 mg by mouth 3 (three) times daily as needed for muscle spasms.    [provider]  escitalopram  (LEXAPRO ) 20 MG tablet Take 20 mg by mouth in the morning.    [provider]  gabapentin  (NEURONTIN ) 300 MG capsule Take 300 mg by mouth 3 (three) times daily as needed (pain.).    [provider]  glipiZIDE (GLUCOTROL) 10 MG tablet Take 10 mg by mouth 2 (two) times daily as needed (blood glucose greater than 200.).    [provider]  ibuprofen (ADVIL) 200 MG tablet Take 600-800 mg by mouth every 8 (eight) hours as needed (pain.).    [provider]  losartan  (COZAAR ) 100 MG tablet Take 1 tablet (100 mg total) by mouth daily. 02/02/13   Singh, Prashant K, MD  metoprolol  succinate (TOPROL  XL) 100 MG 24 hr tablet Take 1 tablet (100 mg total) by mouth daily. Take with or immediately following a meal. 01/17/24   Odean Potts, MD  montelukast (SINGULAIR) 10 MG tablet Take 10 mg by mouth daily as needed (allergies.).    [provider]  MOUNJARO 12.5 MG/0.5ML Pen Inject 12.5 mg into the skin every Thursday. 05/29/24   [provider]  Oxycodone  HCl 20 MG TABS Take 1 tablet by mouth 4 (four) times daily as needed (pain.). 09/05/23   [provider]  SYMBICORT 80-4.5 MCG/ACT inhaler Inhale 2 puffs into the lungs 2 (two) times daily as needed (respiratory issues.). 05/29/24   [provider]  topiramate (TOPAMAX) 50 MG tablet Take 50 mg by mouth at bedtime. 01/19/21   [provider]    Allergies: Doxycycline, Fish allergy, Fish oil, Other, Pollen extract, Citalopram, Amoxicillin , Penicillins, and Strawberry extract    Review of Systems  Updated Vital Signs BP (!) 133/90   Pulse 87   Temp 98.4 F (36.9 C) (Oral)   Resp 16  Ht 1.753 m (5' 9)   Wt (!) 181.4 kg   LMP 02/20/2022 (Exact Date)   SpO2 100%   BMI 59.07 kg/m   Physical Exam Vitals and nursing note reviewed. Exam conducted with a chaperone present.  Constitutional:      General: She is not in acute distress.    Appearance: She is well-developed.  HENT:     Head: Normocephalic and atraumatic.     Right Ear: External ear normal.     Left Ear: External ear normal.  Eyes:     General: No scleral icterus.       Right eye: No discharge.        Left eye: No discharge.     Conjunctiva/sclera: Conjunctivae normal.  Neck:     Trachea: No tracheal deviation.  Cardiovascular:     Rate and Rhythm:  Normal rate.  Pulmonary:     Effort: Pulmonary effort is normal. No respiratory distress.     Breath sounds: No stridor.  Chest:     Comments: Peau d'orange appearance of left breast tissue, induration adjacent to the nipple both medially and laterally, small superficial ulceration lateral to the areola on the left Abdominal:     General: There is no distension.  Musculoskeletal:        General: No swelling or deformity.     Cervical back: Neck supple.  Skin:    General: Skin is warm and dry.     Findings: No rash.  Neurological:     Mental Status: She is alert. Mental status is at baseline.     Cranial Nerves: No dysarthria or facial asymmetry.     Motor: No seizure activity.     (all labs ordered are listed, but only abnormal results are displayed) Labs Reviewed  COMPREHENSIVE METABOLIC PANEL WITH GFR - Abnormal; Notable for the following components:      Result Value   Glucose, Bld 254 (*)    AST 13 (*)    Alkaline Phosphatase 134 (*)    All other components within normal limits  CBC WITH DIFFERENTIAL/PLATELET - Abnormal; Notable for the following components:   Hemoglobin 9.5 (*)    HCT 33.2 (*)    MCV 68.9 (*)    MCH 19.7 (*)    MCHC 28.6 (*)    RDW 19.1 (*)    Platelets 516 (*)    All other components within normal limits  I-STAT CG4 LACTIC ACID, ED    EKG: None  Radiology: No results found.   SABRAUltrasound ED Soft Tissue  Date/Time: 08/17/2024 9:59 PM  Performed by: Randol Simmonds, MD Authorized by: Randol Simmonds, MD   Procedure details:    Indications: localization of abscess     Transverse view:  Visualized   Longitudinal view:  Visualized   Images: archived   Location:    Location comment:  Breast   Side:  Left Comments:     Findings suggestive of possible cellulitis although malignancy also a consideration cobblestoning appearance of the tissue.  Possible ill-defined fluid collection noted to swell    Medications Ordered in the ED  clindamycin  (CLEOCIN) capsule 300 mg (has no administration in time range)                                    Medical Decision Making Amount and/or Complexity of Data Reviewed Labs: ordered. Radiology: ordered.  Risk Prescription drug management.  Patient's laboratory tests are reassuring.  No signs of systemic infection.  She does have possible seroma versus abscess.  Reviewing her previous surgical notes she has known seroma in that area.  Considered needle aspiration in the ED but she otherwise appears systemically well and she does have a known seroma.  Patient is comfortable with deferring aspiration this time.  I will have her start antibiotics.  Have her follow-up with Dr. Aron and we will also refer her to the breast center for further evaluation.     Final diagnoses:  Cellulitis of breast    ED Discharge Orders          Ordered    clindamycin (CLEOCIN) 300 MG capsule  4 times daily        08/17/24 2154    US  BREAST ASPIRATION LEFT       Comments: Pt has been seeing Dr Aron   08/17/24 2156               Randol Simmonds, MD 08/17/24 2201

## 2024-08-17 NOTE — ED Provider Triage Note (Signed)
 Emergency Medicine Provider Triage Evaluation Note  CIPRIANA BILLER , a 47 y.o. female  was evaluated in triage.  Pt complains of left breast pain, wound started 2 weeks ago.  Prior lumpectomy and lymph node removal on 06/26/2024  Review of Systems  Positive: See hpi Negative: Fever, chills  Physical Exam  BP (!) 144/84 (BP Location: Right Arm)   Pulse 88   Temp 98.2 F (36.8 C) (Oral)   Resp 18   Ht 5' 9 (1.753 m)   Wt (!) 181.4 kg   LMP 02/20/2022 (Exact Date)   SpO2 100%   BMI 59.07 kg/m  Gen:   Awake, no distress   Resp:  Normal effort  MSK:   Moves extremities without difficulty  Other:  Mildly draining serosanguineous drainage from wound to left breast  Medical Decision Making  Medically screening exam initiated at 4:35 PM.  Appropriate orders placed.  Raja Caputi Spiers was informed that the remainder of the evaluation will be completed by another provider, this initial triage assessment does not replace that evaluation, and the importance of remaining in the ED until their evaluation is complete.  Labs and lactic ordered   Minnie Tinnie BRAVO, [place] 08/17/24 [phone]

## 2024-08-22 ENCOUNTER — Ambulatory Visit
Admission: RE | Admit: 2024-08-22 | Discharge: 2024-08-22 | Disposition: A | Discharge status: Home / Self Care | Source: Ambulatory Visit | Attending: Radiation Oncology | Admitting: Radiation Oncology

## 2024-08-22 DIAGNOSIS — C50412 Malignant neoplasm of upper-outer quadrant of left female breast: Secondary | ICD-10-CM | POA: Diagnosis not present

## 2024-08-22 DIAGNOSIS — Z170 Estrogen receptor positive status [ER+]: Secondary | ICD-10-CM | POA: Diagnosis not present

## 2024-08-22 DIAGNOSIS — Z510 Encounter for antineoplastic radiation therapy: Secondary | ICD-10-CM | POA: Diagnosis present

## 2024-08-29 ENCOUNTER — Encounter: Payer: Self-pay | Admitting: *Deleted

## 2024-08-29 DIAGNOSIS — C50912 Malignant neoplasm of unspecified site of left female breast: Secondary | ICD-10-CM

## 2024-09-04 ENCOUNTER — Ambulatory Visit
Admission: RE | Admit: 2024-09-04 | Discharge: 2024-09-04 | Disposition: A | Discharge status: Home / Self Care | Source: Ambulatory Visit | Attending: Radiation Oncology | Admitting: Radiation Oncology

## 2024-09-04 DIAGNOSIS — C773 Secondary and unspecified malignant neoplasm of axilla and upper limb lymph nodes: Secondary | ICD-10-CM | POA: Diagnosis not present

## 2024-09-04 DIAGNOSIS — C50412 Malignant neoplasm of upper-outer quadrant of left female breast: Secondary | ICD-10-CM | POA: Insufficient documentation

## 2024-09-04 DIAGNOSIS — Z510 Encounter for antineoplastic radiation therapy: Secondary | ICD-10-CM | POA: Insufficient documentation

## 2024-09-05 ENCOUNTER — Other Ambulatory Visit: Payer: Self-pay

## 2024-09-05 DIAGNOSIS — Z510 Encounter for antineoplastic radiation therapy: Secondary | ICD-10-CM | POA: Diagnosis not present

## 2024-09-05 LAB — RAD ONC ARIA SESSION SUMMARY

## 2024-09-06 ENCOUNTER — Other Ambulatory Visit: Payer: Self-pay

## 2024-09-06 ENCOUNTER — Ambulatory Visit
Admission: RE | Admit: 2024-09-06 | Discharge: 2024-09-06 | Disposition: A | Source: Ambulatory Visit | Attending: Radiation Oncology

## 2024-09-06 DIAGNOSIS — Z510 Encounter for antineoplastic radiation therapy: Secondary | ICD-10-CM | POA: Diagnosis not present

## 2024-09-06 LAB — RAD ONC ARIA SESSION SUMMARY

## 2024-09-07 ENCOUNTER — Ambulatory Visit
Admission: RE | Admit: 2024-09-07 | Discharge: 2024-09-07 | Disposition: A | Discharge status: Home / Self Care | Source: Ambulatory Visit | Attending: Radiation Oncology | Admitting: Radiation Oncology

## 2024-09-07 ENCOUNTER — Other Ambulatory Visit: Payer: Self-pay

## 2024-09-07 ENCOUNTER — Ambulatory Visit

## 2024-09-07 DIAGNOSIS — Z510 Encounter for antineoplastic radiation therapy: Secondary | ICD-10-CM | POA: Diagnosis not present

## 2024-09-07 LAB — RAD ONC ARIA SESSION SUMMARY

## 2024-09-10 ENCOUNTER — Ambulatory Visit: Admission: RE | Admit: 2024-09-10 | Source: Ambulatory Visit

## 2024-09-11 ENCOUNTER — Ambulatory Visit

## 2024-09-12 ENCOUNTER — Telehealth: Payer: Self-pay | Admitting: Radiation Oncology

## 2024-09-12 ENCOUNTER — Ambulatory Visit
Admission: RE | Admit: 2024-09-12 | Discharge: 2024-09-12 | Disposition: A | Discharge status: Home / Self Care | Source: Ambulatory Visit | Attending: Radiation Oncology | Admitting: Radiation Oncology

## 2024-09-12 ENCOUNTER — Other Ambulatory Visit: Payer: Self-pay

## 2024-09-12 DIAGNOSIS — C50912 Malignant neoplasm of unspecified site of left female breast: Secondary | ICD-10-CM

## 2024-09-12 DIAGNOSIS — Z510 Encounter for antineoplastic radiation therapy: Secondary | ICD-10-CM | POA: Diagnosis not present

## 2024-09-12 LAB — RAD ONC ARIA SESSION SUMMARY
Course Elapsed Days: 7
Plan Fractions Treated to Date: 4
Plan Fractions Treated to Date: 4
Plan Prescribed Dose Per Fraction: 1.8 Gy
Plan Prescribed Dose Per Fraction: 1.8 Gy
Plan Total Fractions Prescribed: 28
Plan Total Fractions Prescribed: 28
Plan Total Prescribed Dose: 50.4 Gy
Plan Total Prescribed Dose: 50.4 Gy
Reference Point Dosage Given to Date: 7.2 Gy
Reference Point Dosage Given to Date: 7.2 Gy
Reference Point Session Dosage Given: 1.8 Gy
Reference Point Session Dosage Given: 1.8 Gy
Session Number: 4

## 2024-09-12 MED ORDER — RADIAPLEXRX EX GEL: Freq: Once | CUTANEOUS | Status: AC

## 2024-09-12 MED ORDER — ALRA NON-METALLIC DEODORANT (RAD-ONC)
1.0000 | Freq: Once | TOPICAL | Status: AC
  Administered 2024-09-12: 1 via TOPICAL

## 2024-09-12 NOTE — Telephone Encounter (Signed)
 11/12 Patient left voicemail was unable to come in for her treatments on 11/11 due to emergency came up, but will be here today for her treatments.  Forward email to Support RTT and copied L2 machine, so they are aware.

## 2024-09-13 ENCOUNTER — Other Ambulatory Visit: Payer: Self-pay

## 2024-09-13 ENCOUNTER — Ambulatory Visit
Admission: RE | Admit: 2024-09-13 | Discharge: 2024-09-13 | Disposition: A | Discharge status: Home / Self Care | Source: Ambulatory Visit | Attending: Radiation Oncology | Admitting: Radiation Oncology

## 2024-09-13 DIAGNOSIS — Z510 Encounter for antineoplastic radiation therapy: Secondary | ICD-10-CM | POA: Diagnosis not present

## 2024-09-13 DIAGNOSIS — C50912 Malignant neoplasm of unspecified site of left female breast: Secondary | ICD-10-CM

## 2024-09-13 LAB — RAD ONC ARIA SESSION SUMMARY
Course Elapsed Days: 8
Plan Fractions Treated to Date: 5
Plan Fractions Treated to Date: 5
Plan Prescribed Dose Per Fraction: 1.8 Gy
Plan Prescribed Dose Per Fraction: 1.8 Gy
Plan Total Fractions Prescribed: 28
Plan Total Fractions Prescribed: 28
Plan Total Prescribed Dose: 50.4 Gy
Plan Total Prescribed Dose: 50.4 Gy
Reference Point Dosage Given to Date: 9 Gy
Reference Point Dosage Given to Date: 9 Gy
Reference Point Session Dosage Given: 1.8 Gy
Reference Point Session Dosage Given: 1.8 Gy
Session Number: 5

## 2024-09-14 ENCOUNTER — Other Ambulatory Visit: Payer: Self-pay

## 2024-09-14 ENCOUNTER — Ambulatory Visit
Admission: RE | Admit: 2024-09-14 | Discharge: 2024-09-14 | Disposition: A | Discharge status: Home / Self Care | Source: Ambulatory Visit | Attending: Radiation Oncology | Admitting: Radiation Oncology

## 2024-09-14 ENCOUNTER — Inpatient Hospital Stay: Attending: Physician Assistant | Admitting: Licensed Clinical Social Worker

## 2024-09-14 DIAGNOSIS — Z510 Encounter for antineoplastic radiation therapy: Secondary | ICD-10-CM | POA: Diagnosis not present

## 2024-09-14 LAB — RAD ONC ARIA SESSION SUMMARY
Course Elapsed Days: 9
Plan Fractions Treated to Date: 6
Plan Fractions Treated to Date: 6
Plan Prescribed Dose Per Fraction: 1.8 Gy
Plan Prescribed Dose Per Fraction: 1.8 Gy
Plan Total Fractions Prescribed: 28
Plan Total Fractions Prescribed: 28
Plan Total Prescribed Dose: 50.4 Gy
Plan Total Prescribed Dose: 50.4 Gy
Reference Point Dosage Given to Date: 10.8 Gy
Reference Point Dosage Given to Date: 10.8 Gy
Reference Point Session Dosage Given: 1.8 Gy
Reference Point Session Dosage Given: 1.8 Gy
Session Number: 6

## 2024-09-14 NOTE — Progress Notes (Signed)
 [facility] Clinical Social Work  Clinical Social Work was referred by medical provider for need for [employer] and financial concerns.  Clinical Social Worker contacted patient by phone to offer support and assess for needs.    Patient is dealing with financial stress. She is on disability and lives with her 2 adult (49yo) children. Her daughter was helping but just had a baby and lost her job. Pt receives Section 8 housing, just lost SNAP benefits. In particular, she expressed issues with gas to get to appts.   Interventions: Referred patient to community resources: [employer] grant Discussed needed documents for [employer] application  Provided information on local food pantries and will provide a bag from [employer] each week while in radiation treatment     Follow Up Plan:  Patient will send an eligible bill to CSW for [employer] application. Pt will follow-up with [employer] on [employer]. CSW will leave pt a bag from [facility] food pantry on Monday 11/17    Alexiz Sustaita E Izzac Rockett, LCSW  Clinical Social Worker [employer]

## 2024-09-17 ENCOUNTER — Encounter: Payer: Self-pay | Admitting: Licensed Clinical Social Worker

## 2024-09-17 ENCOUNTER — Ambulatory Visit

## 2024-09-17 NOTE — Progress Notes (Signed)
 Provided bag from [facility] food pantry for pt to pick up today as discussed on 11/14.  Lamanda Rudder E Samanta Gal, LCSW

## 2024-09-18 ENCOUNTER — Ambulatory Visit
Admission: RE | Admit: 2024-09-18 | Discharge: 2024-09-18 | Disposition: A | Discharge status: Home / Self Care | Source: Ambulatory Visit | Attending: Radiation Oncology | Admitting: Radiation Oncology

## 2024-09-18 ENCOUNTER — Other Ambulatory Visit: Payer: Self-pay

## 2024-09-18 ENCOUNTER — Ambulatory Visit

## 2024-09-18 DIAGNOSIS — Z510 Encounter for antineoplastic radiation therapy: Secondary | ICD-10-CM | POA: Diagnosis not present

## 2024-09-18 LAB — RAD ONC ARIA SESSION SUMMARY
Course Elapsed Days: 13
Plan Fractions Treated to Date: 7
Plan Fractions Treated to Date: 7
Plan Prescribed Dose Per Fraction: 1.8 Gy
Plan Prescribed Dose Per Fraction: 1.8 Gy
Plan Total Fractions Prescribed: 28
Plan Total Fractions Prescribed: 28
Plan Total Prescribed Dose: 50.4 Gy
Plan Total Prescribed Dose: 50.4 Gy
Reference Point Dosage Given to Date: 12.6 Gy
Reference Point Dosage Given to Date: 12.6 Gy
Reference Point Session Dosage Given: 1.8 Gy
Reference Point Session Dosage Given: 1.8 Gy
Session Number: 7

## 2024-09-19 ENCOUNTER — Ambulatory Visit

## 2024-09-20 ENCOUNTER — Encounter: Payer: Self-pay | Admitting: Hematology and Oncology

## 2024-09-20 ENCOUNTER — Ambulatory Visit

## 2024-09-20 NOTE — Progress Notes (Signed)
 Spoke w/ pt regarding the [employer].  Lauren made an exception for this pt so she just needs to provide her proof of income which she will bring on 09/21/24.  If she qualifies she will be approved for the grant.

## 2024-09-21 ENCOUNTER — Other Ambulatory Visit: Payer: Self-pay

## 2024-09-21 ENCOUNTER — Ambulatory Visit
Admission: RE | Admit: 2024-09-21 | Discharge: 2024-09-21 | Disposition: A | Discharge status: Home / Self Care | Source: Ambulatory Visit | Attending: Radiation Oncology | Admitting: Radiation Oncology

## 2024-09-21 DIAGNOSIS — Z510 Encounter for antineoplastic radiation therapy: Secondary | ICD-10-CM | POA: Diagnosis not present

## 2024-09-21 LAB — RAD ONC ARIA SESSION SUMMARY
Course Elapsed Days: 16
Plan Fractions Treated to Date: 8
Plan Fractions Treated to Date: 8
Plan Prescribed Dose Per Fraction: 1.8 Gy
Plan Prescribed Dose Per Fraction: 1.8 Gy
Plan Total Fractions Prescribed: 28
Plan Total Fractions Prescribed: 28
Plan Total Prescribed Dose: 50.4 Gy
Plan Total Prescribed Dose: 50.4 Gy
Reference Point Dosage Given to Date: 14.4 Gy
Reference Point Dosage Given to Date: 14.4 Gy
Reference Point Session Dosage Given: 1.8 Gy
Reference Point Session Dosage Given: 1.8 Gy
Session Number: 8

## 2024-09-24 ENCOUNTER — Telehealth: Payer: Self-pay | Admitting: Radiation Oncology

## 2024-09-24 ENCOUNTER — Ambulatory Visit

## 2024-09-24 ENCOUNTER — Encounter: Payer: Self-pay | Admitting: Hematology and Oncology

## 2024-09-24 ENCOUNTER — Encounter: Payer: Self-pay | Admitting: Licensed Clinical Social Worker

## 2024-09-24 NOTE — Progress Notes (Signed)
 Provided bag from [employer].   Michelle Guardado E Romey Mathieson, LCSW

## 2024-09-24 NOTE — Progress Notes (Signed)
 Pt is approved for the $1000 [employer].

## 2024-09-24 NOTE — Telephone Encounter (Signed)
 Pt called to advise she will not be coming in due to not feeling well. Pt advised sx at this time are pain to L shoulder, nausea and chills. Pt also advised due to nausea she has not been able to eat. She is unable to confirm fever at this time. Pt was asked if she would like to speak with nursing, she advised not at this time but would call back if needed.

## 2024-09-25 ENCOUNTER — Other Ambulatory Visit: Payer: Self-pay | Admitting: Radiation Oncology

## 2024-09-25 ENCOUNTER — Ambulatory Visit
Admission: RE | Admit: 2024-09-25 | Discharge: 2024-09-25 | Disposition: A | Discharge status: Home / Self Care | Source: Ambulatory Visit | Attending: Radiation Oncology | Admitting: Radiation Oncology

## 2024-09-25 ENCOUNTER — Other Ambulatory Visit: Payer: Self-pay

## 2024-09-25 ENCOUNTER — Encounter: Payer: Self-pay | Admitting: Radiation Oncology

## 2024-09-25 DIAGNOSIS — C50912 Malignant neoplasm of unspecified site of left female breast: Secondary | ICD-10-CM

## 2024-09-25 DIAGNOSIS — Z510 Encounter for antineoplastic radiation therapy: Secondary | ICD-10-CM | POA: Diagnosis not present

## 2024-09-25 DIAGNOSIS — C50419 Malignant neoplasm of upper-outer quadrant of unspecified female breast: Secondary | ICD-10-CM

## 2024-09-25 LAB — RAD ONC ARIA SESSION SUMMARY
Course Elapsed Days: 20
Plan Fractions Treated to Date: 9
Plan Fractions Treated to Date: 9
Plan Prescribed Dose Per Fraction: 1.8 Gy
Plan Prescribed Dose Per Fraction: 1.8 Gy
Plan Total Fractions Prescribed: 28
Plan Total Fractions Prescribed: 28
Plan Total Prescribed Dose: 50.4 Gy
Plan Total Prescribed Dose: 50.4 Gy
Reference Point Dosage Given to Date: 16.2 Gy
Reference Point Dosage Given to Date: 16.2 Gy
Reference Point Session Dosage Given: 1.8 Gy
Reference Point Session Dosage Given: 1.8 Gy
Session Number: 9

## 2024-09-25 MED ORDER — ONDANSETRON HCL 8 MG PO TABS: 8.0000 mg | ORAL_TABLET | Freq: Three times a day (TID) | ORAL | 0 refills | Status: AC | PRN

## 2024-09-25 NOTE — Progress Notes (Signed)
 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 The patient has missed several appointments due to complaints of nausea, poor oral intake and pain in her left shoulder and neck. She states she's not been seen by orthopedics for this problem or through the breast oncology PT team. I let her know I think she should be seen by each team, and that if she's nauseated most days and having poor appetite, she should have further discussion with her team. She may have some symptoms as well given recent use of GLP1 medication. Despite poor oral intake, she is able to take in liquids.   PE In general this is a well appearing obese African American female in no acute distress. She's alert and oriented x4 and appropriate throughout the examination. Cardiopulmonary assessment is negative for acute distress and she exhibits normal effort. She has redundant skin over the the upper arms and her shoulder has dry dermatitis without desquamation. She has pain in the deltoid without any focal lesions or fullness.   A/P We discussed referral to orthopedics and evaluation with PT, and encouraged her to discuss her symptoms with her medical oncology team as well. I will send in a prescription for Zofran  and she will try to proceed with radiation today. If she is unable to position today on the table for treatment we may need to try to replan her therapy.      Donald KYM Husband, PAC

## 2024-09-26 ENCOUNTER — Ambulatory Visit

## 2024-10-01 ENCOUNTER — Other Ambulatory Visit: Payer: Self-pay

## 2024-10-01 ENCOUNTER — Ambulatory Visit
Admission: RE | Admit: 2024-10-01 | Discharge: 2024-10-01 | Disposition: A | Source: Ambulatory Visit | Attending: Radiation Oncology

## 2024-10-01 DIAGNOSIS — C50412 Malignant neoplasm of upper-outer quadrant of left female breast: Secondary | ICD-10-CM | POA: Diagnosis not present

## 2024-10-01 DIAGNOSIS — Z510 Encounter for antineoplastic radiation therapy: Secondary | ICD-10-CM | POA: Insufficient documentation

## 2024-10-01 DIAGNOSIS — C773 Secondary and unspecified malignant neoplasm of axilla and upper limb lymph nodes: Secondary | ICD-10-CM | POA: Insufficient documentation

## 2024-10-01 LAB — RAD ONC ARIA SESSION SUMMARY
Course Elapsed Days: 26
Plan Fractions Treated to Date: 10
Plan Fractions Treated to Date: 10
Plan Prescribed Dose Per Fraction: 1.8 Gy
Plan Prescribed Dose Per Fraction: 1.8 Gy
Plan Total Fractions Prescribed: 28
Plan Total Fractions Prescribed: 28
Plan Total Prescribed Dose: 50.4 Gy
Plan Total Prescribed Dose: 50.4 Gy
Reference Point Dosage Given to Date: 18 Gy
Reference Point Dosage Given to Date: 18 Gy
Reference Point Session Dosage Given: 1.8 Gy
Reference Point Session Dosage Given: 1.8 Gy
Session Number: 10

## 2024-10-02 ENCOUNTER — Ambulatory Visit
Admission: RE | Admit: 2024-10-02 | Discharge: 2024-10-02 | Disposition: A | Source: Ambulatory Visit | Attending: Radiation Oncology

## 2024-10-02 ENCOUNTER — Ambulatory Visit

## 2024-10-02 ENCOUNTER — Encounter: Payer: Self-pay | Admitting: Licensed Clinical Social Worker

## 2024-10-02 ENCOUNTER — Other Ambulatory Visit: Payer: Self-pay

## 2024-10-02 DIAGNOSIS — Z510 Encounter for antineoplastic radiation therapy: Secondary | ICD-10-CM | POA: Diagnosis not present

## 2024-10-02 LAB — RAD ONC ARIA SESSION SUMMARY
Course Elapsed Days: 27
Plan Fractions Treated to Date: 11
Plan Fractions Treated to Date: 11
Plan Prescribed Dose Per Fraction: 1.8 Gy
Plan Prescribed Dose Per Fraction: 1.8 Gy
Plan Total Fractions Prescribed: 28
Plan Total Fractions Prescribed: 28
Plan Total Prescribed Dose: 50.4 Gy
Plan Total Prescribed Dose: 50.4 Gy
Reference Point Dosage Given to Date: 19.8 Gy
Reference Point Dosage Given to Date: 19.8 Gy
Reference Point Session Dosage Given: 1.8 Gy
Reference Point Session Dosage Given: 1.8 Gy
Session Number: 11

## 2024-10-03 ENCOUNTER — Ambulatory Visit

## 2024-10-04 ENCOUNTER — Ambulatory Visit

## 2024-10-04 ENCOUNTER — Telehealth: Payer: Self-pay | Admitting: Radiation Oncology

## 2024-10-04 NOTE — Telephone Encounter (Signed)
 12/4 Received voicemail from patient on 12/3 to cancel treatment appt for that day due to being very sick.  Email forward to L2 machine and copied Support RTT, so they are aware.

## 2024-10-05 ENCOUNTER — Ambulatory Visit: Admission: RE | Admit: 2024-10-05 | Discharge: 2024-10-05 | Attending: Radiation Oncology

## 2024-10-05 ENCOUNTER — Other Ambulatory Visit: Payer: Self-pay

## 2024-10-05 ENCOUNTER — Ambulatory Visit
Admission: RE | Admit: 2024-10-05 | Discharge: 2024-10-05 | Disposition: A | Source: Ambulatory Visit | Attending: Radiation Oncology

## 2024-10-05 DIAGNOSIS — Z510 Encounter for antineoplastic radiation therapy: Secondary | ICD-10-CM | POA: Diagnosis not present

## 2024-10-05 LAB — RAD ONC ARIA SESSION SUMMARY
Course Elapsed Days: 30
Plan Fractions Treated to Date: 12
Plan Fractions Treated to Date: 12
Plan Prescribed Dose Per Fraction: 1.8 Gy
Plan Prescribed Dose Per Fraction: 1.8 Gy
Plan Total Fractions Prescribed: 28
Plan Total Fractions Prescribed: 28
Plan Total Prescribed Dose: 50.4 Gy
Plan Total Prescribed Dose: 50.4 Gy
Reference Point Dosage Given to Date: 21.6 Gy
Reference Point Dosage Given to Date: 21.6 Gy
Reference Point Session Dosage Given: 1.8 Gy
Reference Point Session Dosage Given: 1.8 Gy
Session Number: 12

## 2024-10-08 ENCOUNTER — Other Ambulatory Visit: Payer: Self-pay

## 2024-10-08 ENCOUNTER — Ambulatory Visit
Admission: RE | Admit: 2024-10-08 | Discharge: 2024-10-08 | Disposition: A | Source: Ambulatory Visit | Attending: Radiation Oncology

## 2024-10-08 DIAGNOSIS — Z510 Encounter for antineoplastic radiation therapy: Secondary | ICD-10-CM | POA: Diagnosis not present

## 2024-10-08 LAB — RAD ONC ARIA SESSION SUMMARY
Course Elapsed Days: 33
Plan Fractions Treated to Date: 13
Plan Fractions Treated to Date: 13
Plan Prescribed Dose Per Fraction: 1.8 Gy
Plan Prescribed Dose Per Fraction: 1.8 Gy
Plan Total Fractions Prescribed: 28
Plan Total Fractions Prescribed: 28
Plan Total Prescribed Dose: 50.4 Gy
Plan Total Prescribed Dose: 50.4 Gy
Reference Point Dosage Given to Date: 23.4 Gy
Reference Point Dosage Given to Date: 23.4 Gy
Reference Point Session Dosage Given: 1.8 Gy
Reference Point Session Dosage Given: 1.8 Gy
Session Number: 13

## 2024-10-09 ENCOUNTER — Other Ambulatory Visit: Payer: Self-pay

## 2024-10-09 ENCOUNTER — Ambulatory Visit
Admission: RE | Admit: 2024-10-09 | Discharge: 2024-10-09 | Disposition: A | Source: Ambulatory Visit | Attending: Radiation Oncology

## 2024-10-09 DIAGNOSIS — Z510 Encounter for antineoplastic radiation therapy: Secondary | ICD-10-CM | POA: Diagnosis not present

## 2024-10-09 LAB — RAD ONC ARIA SESSION SUMMARY
Course Elapsed Days: 34
Plan Fractions Treated to Date: 14
Plan Fractions Treated to Date: 14
Plan Prescribed Dose Per Fraction: 1.8 Gy
Plan Prescribed Dose Per Fraction: 1.8 Gy
Plan Total Fractions Prescribed: 28
Plan Total Fractions Prescribed: 28
Plan Total Prescribed Dose: 50.4 Gy
Plan Total Prescribed Dose: 50.4 Gy
Reference Point Dosage Given to Date: 25.2 Gy
Reference Point Dosage Given to Date: 25.2 Gy
Reference Point Session Dosage Given: 1.8 Gy
Reference Point Session Dosage Given: 1.8 Gy
Session Number: 14

## 2024-10-10 ENCOUNTER — Ambulatory Visit
Admission: RE | Admit: 2024-10-10 | Discharge: 2024-10-10 | Disposition: A | Discharge status: Home / Self Care | Source: Ambulatory Visit | Attending: Radiation Oncology | Admitting: Radiation Oncology

## 2024-10-10 ENCOUNTER — Other Ambulatory Visit: Payer: Self-pay

## 2024-10-10 DIAGNOSIS — Z510 Encounter for antineoplastic radiation therapy: Secondary | ICD-10-CM | POA: Diagnosis not present

## 2024-10-10 LAB — RAD ONC ARIA SESSION SUMMARY
Course Elapsed Days: 35
Plan Fractions Treated to Date: 15
Plan Fractions Treated to Date: 15
Plan Prescribed Dose Per Fraction: 1.8 Gy
Plan Prescribed Dose Per Fraction: 1.8 Gy
Plan Total Fractions Prescribed: 28
Plan Total Fractions Prescribed: 28
Plan Total Prescribed Dose: 50.4 Gy
Plan Total Prescribed Dose: 50.4 Gy
Reference Point Dosage Given to Date: 27 Gy
Reference Point Dosage Given to Date: 27 Gy
Reference Point Session Dosage Given: 1.8 Gy
Reference Point Session Dosage Given: 1.8 Gy
Session Number: 15

## 2024-10-11 ENCOUNTER — Ambulatory Visit: Admitting: Physical Therapy

## 2024-10-11 ENCOUNTER — Ambulatory Visit
Admission: RE | Admit: 2024-10-11 | Discharge: 2024-10-11 | Disposition: A | Discharge status: Home / Self Care | Source: Ambulatory Visit | Attending: Radiation Oncology | Admitting: Radiation Oncology

## 2024-10-11 ENCOUNTER — Other Ambulatory Visit: Payer: Self-pay

## 2024-10-11 DIAGNOSIS — Z510 Encounter for antineoplastic radiation therapy: Secondary | ICD-10-CM | POA: Diagnosis not present

## 2024-10-11 LAB — RAD ONC ARIA SESSION SUMMARY
Course Elapsed Days: 36
Plan Fractions Treated to Date: 16
Plan Fractions Treated to Date: 16
Plan Prescribed Dose Per Fraction: 1.8 Gy
Plan Prescribed Dose Per Fraction: 1.8 Gy
Plan Total Fractions Prescribed: 28
Plan Total Fractions Prescribed: 28
Plan Total Prescribed Dose: 50.4 Gy
Plan Total Prescribed Dose: 50.4 Gy
Reference Point Dosage Given to Date: 28.8 Gy
Reference Point Dosage Given to Date: 28.8 Gy
Reference Point Session Dosage Given: 1.8 Gy
Reference Point Session Dosage Given: 1.8 Gy
Session Number: 16

## 2024-10-12 ENCOUNTER — Ambulatory Visit
Admission: RE | Admit: 2024-10-12 | Discharge: 2024-10-12 | Disposition: A | Source: Ambulatory Visit | Attending: Radiation Oncology

## 2024-10-12 ENCOUNTER — Ambulatory Visit: Admitting: Radiation Oncology

## 2024-10-12 ENCOUNTER — Other Ambulatory Visit: Payer: Self-pay

## 2024-10-12 DIAGNOSIS — Z510 Encounter for antineoplastic radiation therapy: Secondary | ICD-10-CM | POA: Diagnosis not present

## 2024-10-12 LAB — RAD ONC ARIA SESSION SUMMARY
Course Elapsed Days: 37
Plan Fractions Treated to Date: 17
Plan Fractions Treated to Date: 17
Plan Prescribed Dose Per Fraction: 1.8 Gy
Plan Prescribed Dose Per Fraction: 1.8 Gy
Plan Total Fractions Prescribed: 28
Plan Total Fractions Prescribed: 28
Plan Total Prescribed Dose: 50.4 Gy
Plan Total Prescribed Dose: 50.4 Gy
Reference Point Dosage Given to Date: 30.6 Gy
Reference Point Dosage Given to Date: 30.6 Gy
Reference Point Session Dosage Given: 1.8 Gy
Reference Point Session Dosage Given: 1.8 Gy
Session Number: 17

## 2024-10-15 ENCOUNTER — Other Ambulatory Visit: Payer: Self-pay

## 2024-10-15 ENCOUNTER — Ambulatory Visit
Admission: RE | Admit: 2024-10-15 | Discharge: 2024-10-15 | Disposition: A | Source: Ambulatory Visit | Attending: Radiation Oncology

## 2024-10-15 DIAGNOSIS — Z510 Encounter for antineoplastic radiation therapy: Secondary | ICD-10-CM | POA: Diagnosis not present

## 2024-10-15 LAB — RAD ONC ARIA SESSION SUMMARY
Course Elapsed Days: 40
Plan Fractions Treated to Date: 18
Plan Fractions Treated to Date: 18
Plan Prescribed Dose Per Fraction: 1.8 Gy
Plan Prescribed Dose Per Fraction: 1.8 Gy
Plan Total Fractions Prescribed: 28
Plan Total Fractions Prescribed: 28
Plan Total Prescribed Dose: 50.4 Gy
Plan Total Prescribed Dose: 50.4 Gy
Reference Point Dosage Given to Date: 32.4 Gy
Reference Point Dosage Given to Date: 32.4 Gy
Reference Point Session Dosage Given: 1.8 Gy
Reference Point Session Dosage Given: 1.8 Gy
Session Number: 18

## 2024-10-16 ENCOUNTER — Ambulatory Visit
Admission: RE | Admit: 2024-10-16 | Discharge: 2024-10-16 | Disposition: A | Source: Ambulatory Visit | Attending: Radiation Oncology

## 2024-10-16 ENCOUNTER — Other Ambulatory Visit: Payer: Self-pay

## 2024-10-16 DIAGNOSIS — Z510 Encounter for antineoplastic radiation therapy: Secondary | ICD-10-CM | POA: Diagnosis not present

## 2024-10-16 LAB — RAD ONC ARIA SESSION SUMMARY
Course Elapsed Days: 41
Plan Fractions Treated to Date: 19
Plan Fractions Treated to Date: 19
Plan Prescribed Dose Per Fraction: 1.8 Gy
Plan Prescribed Dose Per Fraction: 1.8 Gy
Plan Total Fractions Prescribed: 28
Plan Total Fractions Prescribed: 28
Plan Total Prescribed Dose: 50.4 Gy
Plan Total Prescribed Dose: 50.4 Gy
Reference Point Dosage Given to Date: 34.2 Gy
Reference Point Dosage Given to Date: 34.2 Gy
Reference Point Session Dosage Given: 1.8 Gy
Reference Point Session Dosage Given: 1.8 Gy
Session Number: 19

## 2024-10-17 ENCOUNTER — Other Ambulatory Visit: Payer: Self-pay

## 2024-10-17 ENCOUNTER — Ambulatory Visit: Admission: RE | Admit: 2024-10-17 | Discharge: 2024-10-17 | Attending: Radiation Oncology

## 2024-10-17 DIAGNOSIS — Z510 Encounter for antineoplastic radiation therapy: Secondary | ICD-10-CM | POA: Diagnosis not present

## 2024-10-17 LAB — RAD ONC ARIA SESSION SUMMARY
Course Elapsed Days: 42
Plan Fractions Treated to Date: 20
Plan Fractions Treated to Date: 20
Plan Prescribed Dose Per Fraction: 1.8 Gy
Plan Prescribed Dose Per Fraction: 1.8 Gy
Plan Total Fractions Prescribed: 28
Plan Total Fractions Prescribed: 28
Plan Total Prescribed Dose: 50.4 Gy
Plan Total Prescribed Dose: 50.4 Gy
Reference Point Dosage Given to Date: 36 Gy
Reference Point Dosage Given to Date: 36 Gy
Reference Point Session Dosage Given: 1.8 Gy
Reference Point Session Dosage Given: 1.8 Gy
Session Number: 20

## 2024-10-18 ENCOUNTER — Ambulatory Visit

## 2024-10-19 ENCOUNTER — Other Ambulatory Visit: Payer: Self-pay

## 2024-10-19 ENCOUNTER — Ambulatory Visit

## 2024-10-19 ENCOUNTER — Ambulatory Visit
Admission: RE | Admit: 2024-10-19 | Discharge: 2024-10-19 | Disposition: A | Source: Ambulatory Visit | Attending: Radiation Oncology

## 2024-10-19 DIAGNOSIS — Z510 Encounter for antineoplastic radiation therapy: Secondary | ICD-10-CM | POA: Diagnosis not present

## 2024-10-19 LAB — RAD ONC ARIA SESSION SUMMARY
Course Elapsed Days: 44
Plan Fractions Treated to Date: 21
Plan Fractions Treated to Date: 21
Plan Prescribed Dose Per Fraction: 1.8 Gy
Plan Prescribed Dose Per Fraction: 1.8 Gy
Plan Total Fractions Prescribed: 28
Plan Total Fractions Prescribed: 28
Plan Total Prescribed Dose: 50.4 Gy
Plan Total Prescribed Dose: 50.4 Gy
Reference Point Dosage Given to Date: 37.8 Gy
Reference Point Dosage Given to Date: 37.8 Gy
Reference Point Session Dosage Given: 1.8 Gy
Reference Point Session Dosage Given: 1.8 Gy
Session Number: 21

## 2024-10-22 ENCOUNTER — Ambulatory Visit

## 2024-10-22 ENCOUNTER — Telehealth: Payer: Self-pay

## 2024-10-22 NOTE — Telephone Encounter (Signed)
 Pt called and s/w Sharene Cary, RN regarding her appt scheduled tomorrow 12/23. She states she has been exposed to COVID but currently does not have any S/SX. Message sent to schedulers to r/s.

## 2024-10-23 ENCOUNTER — Ambulatory Visit

## 2024-10-23 ENCOUNTER — Inpatient Hospital Stay: Attending: Physician Assistant | Admitting: Hematology and Oncology

## 2024-10-24 ENCOUNTER — Ambulatory Visit

## 2024-10-24 ENCOUNTER — Encounter: Payer: Self-pay | Admitting: Hematology and Oncology

## 2024-10-25 ENCOUNTER — Ambulatory Visit

## 2024-10-29 ENCOUNTER — Ambulatory Visit

## 2024-10-29 ENCOUNTER — Other Ambulatory Visit: Payer: Self-pay

## 2024-10-29 ENCOUNTER — Ambulatory Visit
Admission: RE | Admit: 2024-10-29 | Discharge: 2024-10-29 | Disposition: A | Discharge status: Home / Self Care | Source: Ambulatory Visit | Attending: Radiation Oncology | Admitting: Radiation Oncology

## 2024-10-29 DIAGNOSIS — Z510 Encounter for antineoplastic radiation therapy: Secondary | ICD-10-CM | POA: Diagnosis not present

## 2024-10-29 LAB — RAD ONC ARIA SESSION SUMMARY
Course Elapsed Days: 54
Plan Fractions Treated to Date: 1
Plan Fractions Treated to Date: 22
Plan Prescribed Dose Per Fraction: 1.8 Gy
Plan Prescribed Dose Per Fraction: 1.8 Gy
Plan Total Fractions Prescribed: 28
Plan Total Fractions Prescribed: 7
Plan Total Prescribed Dose: 12.6 Gy
Plan Total Prescribed Dose: 50.4 Gy
Reference Point Dosage Given to Date: 39.6 Gy
Reference Point Dosage Given to Date: 39.6 Gy
Reference Point Session Dosage Given: 1.8 Gy
Reference Point Session Dosage Given: 1.8 Gy
Session Number: 22

## 2024-10-29 NOTE — Therapy (Signed)
 " OUTPATIENT PHYSICAL THERAPY  UPPER EXTREMITY ONCOLOGY EVALUATION  Patient Name: [name] MRN: [id number] DOB:09-07-1976, 48 y.o., female Today's Date: 10/30/2024  END OF SESSION:  PT End of Session - 10/30/24 1649     Visit Number 1    Number of Visits 13    Date for Recertification  12/11/24    PT Start Time 1602    PT Stop Time 1659    PT Time Calculation (min) 57 min    Activity Tolerance Patient tolerated treatment well    Behavior During Therapy WFL for tasks assessed/performed          Past Medical History:  Diagnosis Date   Allergic rhinitis    Anemia    Anxiety disorder    Arthritis    Asthma    Benign essential hypertension    Beta thalassemia trait    Depression    Diabetes mellitus without complication (HCC)    Edema    Fatty liver    History of blood clots    Hyperglycemia    Lumbar radiculopathy    Metrorrhagia    Migraines    Morbid obesity (HCC)    OSA (obstructive sleep apnea)    Sickle cell trait    Snoring    Past Surgical History:  Procedure Laterality Date   BREAST BIOPSY Left 05/28/2024   MM LT BREAST BX W LOC DEV 1ST LESION IMAGE BX SPEC STEREO GUIDE 05/28/2024 [facility]   BREAST BIOPSY Left 06/11/2024   MM LT BREAST BX W LOC DEV 1ST LESION IMAGE BX SPEC STEREO GUIDE 06/11/2024 [facility]   BREAST BIOPSY  06/26/2024   MM LT RADIOACTIVE SEED EA ADD LESION LOC MAMMO GUIDE 06/26/2024 [facility]   BREAST BIOPSY Left 06/26/2024   US  LT RADIOACTIVE SEED LOC 06/26/2024 [facility]   CESAREAN SECTION     CHOLECYSTECTOMY     IR IMAGING GUIDED PORT INSERTION  08/08/2023   IR US  SOFT TISSUE NECK  09/21/2023   PORT-A-CATH REMOVAL Right 06/26/2024   Procedure: REMOVAL PORT-A-CATH;  Surgeon: Aron Shoulders, MD;  Location: [facility];  Service: General;  Laterality: Right;   RADIOACTIVE SEED GUIDED AXILLARY SENTINEL LYMPH NODE Left 06/26/2024   Procedure: RADIOACTIVE SEED GUIDED AXILLARY SENTINEL LYMPH NODE BIOPSY;  Surgeon:  Aron Shoulders, MD;  Location: [facility];  Service: General;  Laterality: Left;   SENTINEL NODE BIOPSY Left 06/26/2024   Procedure: BIOPSY, LYMPH NODE, SENTINEL;  Surgeon: Aron Shoulders, MD;  Location: [facility];  Service: General;  Laterality: Left;  GEN w/PEC BLOCK LEFT SEED TARGETED LYMPH NODE EXCISION SENTINEL LYMPH NODE BIOPSY LEFT BREAST SEED LOCALIZED EXCISIONAL BIOPSY   TUBAL LIGATION     Patient Active Problem List   Diagnosis Date Noted   Breast cancer metastasized to axillary lymph node, left (HCC) 07/19/2023   Menorrhagia 03/01/2018   Fibroids 03/01/2018   Obesity, morbid (HCC) 12/24/2013   Obesity hypoventilation syndrome (HCC) 12/24/2013   Iron  deficiency anemia 09/29/2012   Pulmonary embolism (HCC) 09/26/2012   SOB (shortness of breath) 09/25/2012   Anemia 09/25/2012   PALPITATIONS 03/10/2010   DEPRESSION 12/09/2009   MENORRHAGIA 12/09/2009   Disorder of bursae and tendons in shoulder region 12/09/2009   PANIC DISORDER 07/17/2009   LUMBAR RADICULOPATHY, LEFT 07/17/2009   SLEEP APNEA, OBSTRUCTIVE 11/05/2008   PERIPHERAL EDEMA 11/05/2008   URINALYSIS, ABNORMAL 11/05/2008   Morbid obesity (HCC) 06/09/2007   HYPERTENSION, BENIGN ESSENTIAL 06/09/2007   Allergic rhinitis 06/09/2007   Asthma 06/09/2007  URTICARIA 06/09/2007   ANXIETY DISORDER, HX OF 06/09/2007   DEPRESSION, HX OF 06/09/2007   HYPERGLYCEMIA 06/02/2007   CYST, VULVA 07/25/2006   ANEMIA, IRON  DEFICIENCY 12/28/2005   ECHOCARDIOGRAM, ABNORMAL 01/25/2005   Personal history of venous thrombosis and embolism 01/24/2005    PCP: [facility]  REFERRING PROVIDER: Lanell Donald Stagger, PA-C  REFERRING DIAG: [phone] (ICD-10-CM) - Breast cancer metastasized to axillary lymph node, left (HCC) C50.419,Z17.0 (ICD-10-CM) - 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, estrogen receptor positive, unspecified laterality (HCC)  THERAPY DIAG:  Decreased ROM of left shoulder  Decreased  ROM of right shoulder  Abnormal posture  Repeated falls  Muscle weakness (generalized)  Breast cancer metastasized to axillary lymph node, left (HCC)  Malignant neoplasm of upper-outer quadrant of left breast in female, estrogen receptor positive (HCC)  ONSET DATE: 2010   Rationale for Evaluation and Treatment: Rehabilitation  SUBJECTIVE:                                                                                                                                                                                           SUBJECTIVE STATEMENT: Both of my shoulders have messed up rotator cuff. The left shoulder is worse. After surgery I had to lie down for radiation and I would cry because it would hurt so bad to get it in position for radiation. My hands get numb when I lie on my R side and I can't lay on my L side because the shoulder hurts so bad. My back went out in 2010. I hurt my back with a w/c patient. I could not walk or anything for 3 months and that is when my shoulders started hurting.   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 Yes NPRS scale: 10/10 Pain location: L shoulder Pain orientation: Left  PAIN TYPE: aching, sharp, and throbbing Pain description: constant  Aggravating factors: moving it  Relieving factors: getting it in a certain position and keeping it still   PRECAUTIONS: Other: LUE lymphedema risk  RED FLAGS: None   WEIGHT BEARING RESTRICTIONS: No  FALLS:  Has patient fallen in last 6 months? Yes. Number of falls  3  LIVING ENVIRONMENT: Lives with: lives with  children Lives in: House/apartment Stairs: No; has ramp Has following equipment at home: Single point cane and Wheelchair (manual)  OCCUPATION: on disability  LEISURE: does not currently exercise  HAND DOMINANCE: right   PRIOR LEVEL OF FUNCTION: Independent with household mobility without device, Needs assistance with ADLs, and Needs assistance with homemaking  PATIENT GOALS: to be able to stand, walk, improve L shoulder ROM, be able to walk through the grocery store, decrease swelling   OBJECTIVE: Note: Objective measures were completed at Evaluation unless otherwise noted.  COGNITION: Overall cognitive status: Within functional limits for tasks assessed   PALPATION: Fibrosis present in L breast  OBSERVATIONS / OTHER ASSESSMENTS: peau d orange L breast, skin break down L breast and neck from radiation  POSTURE: forward head, rounded shoulders  UPPER EXTREMITY AROM/PROM:  A/PROM RIGHT   eval   Shoulder extension 70  Shoulder flexion 103  Shoulder abduction 66  Shoulder internal rotation Unable   Shoulder external rotation Unable     (Blank rows = not tested)  A/PROM LEFT   eval  Shoulder extension 45  Shoulder flexion 42  Shoulder abduction 45  Shoulder internal rotation unable  Shoulder external rotation unable    (Blank rows = not tested)  UPPER EXTREMITY STRENGTH: unable to assess due to pain  LYMPHEDEMA ASSESSMENTS:   SURGERY TYPE/DATE: 06/26/24 - L breast lumpectomy and SLNB  NUMBER OF LYMPH NODES REMOVED: 0/4  CHEMOTHERAPY: completed neoadjuvant but stopped due to skin breakdown on feet  RADIATION:currently undergoing  HORMONE TREATMENT: none yet  INFECTIONS: developed cellulitis after lumpectomy   LYMPHEDEMA ASSESSMENTS: - will measure at next session, not done today due to time constraints  LANDMARK RIGHT  eval  At axilla    15 cm proximal to the proximal aspect of the olecranon process    10 cm proximal to the proximal aspect of the olecranon process   Olecranon process   15 cm proximal to the proximal aspect of the ulnar styloid process   10 cm proximal to the proximal aspect of the ulnar styloid process   Just distal to the ulnar styloid process   Across hand at thumb web space   At base of 2nd digit   (Blank rows = not tested)  LANDMARK LEFT  eval  At axilla    15 cm proximal to the proximal aspect of the olecranon process   10 cm proximal to the proximal aspect of the olecranon process   Olecranon process   15 cm proximal to the proximal aspect of the ulnar styloid process   10 cm proximal to  the proximal aspect of the ulnar styloid process   Just distal to the ulnar styloid process   Across hand at thumb web space   At base of 2nd digit   (Blank rows = not tested)  Chest circumference just inferior to the axillae:  Chest circumference at the largest point:     QUICK DASH:   Quick Dash - 10/30/24 0001     Open a tight or new jar Moderate difficulty    Do heavy household chores (wash walls, wash floors) Unable    Carry a shopping bag or briefcase Moderate difficulty    Wash your back Unable    Use a knife to cut food Mild difficulty    Recreational activities in which you take some force or impact through your arm, shoulder, or hand (golf, hammering, tennis) Severe difficulty    During the past week, to what extent  has your arm, shoulder or hand problem interfered with your normal social activities with family, friends, neighbors, or groups? Quite a bit    During the past week, to what extent has your arm, shoulder or hand problem limited your work or other regular daily activities Quite a bit    Arm, shoulder, or hand pain. Extreme    Tingling (pins and needles) in your arm, shoulder, or hand Extreme    Difficulty Sleeping Severe difficulty    DASH Score 75 %          L-DEX LYMPHEDEMA SCREENING: will assess at next session The patient was assessed  using the L-Dex machine today to produce a lymphedema index baseline score. The patient will be reassessed on a regular basis (typically every 3 months) to obtain new L-Dex scores. If the score is > 6.5 points away from his/her baseline score indicating onset of subclinical lymphedema, it will be recommended to wear a compression garment for 4 weeks, 12 hours per day and then be reassessed. If the score continues to be > 6.5 points from baseline at reassessment, we will initiate lymphedema treatment. Assessing in this manner has a 95% rate of preventing clinically significant lymphedema.                                                                                                                              TREATMENT DATE:  10/30/24- educated pt about lymphedema, anatomy and physiology of the lymphatic system and lymphedema risk reduction practices    PATIENT EDUCATION:  Education details: anatomy and physiology of the lymphatic system, lymphedema risk reduction practices, need for compression once skin is healed and MLD Person educated: Patient Education method: Explanation Education comprehension: verbalized understanding  HOME EXERCISE PROGRAM: None yet  ASSESSMENT:  CLINICAL IMPRESSION: Patient is a 48 y.o. female who was seen today for physical therapy evaluation and treatment for decrease bilateral shoulder ROM, L breast lymphedema, repeated falls and weakness. Pt was undergoing neoadjuvant chemo but had to stop due to skin break down and neuropathy of bilateral feet. She has had multiple falls in the past 6 months. She had a L breast lumpectomy and SLNB on 06/26/24. Her wound opened due to swelling and she developed cellulitis. She is now half way through her radiation but has skin break down at collar bone and throughout L breast as well as L breast lymphedema. Her bilateral shoulder ROM is severely limited with L worse than R. She reports she has had shoulder issues since her back  injury in 2010. She would benefit from skilled PT services to improve bilateral shoulder ROM, decrease L breast lymphedema, decrease pain, and eventually to improve strength and balance.    OBJECTIVE IMPAIRMENTS: decreased knowledge of condition, decreased knowledge of use of DME, decreased mobility, difficulty walking, decreased ROM, decreased strength, increased edema, increased fascial restrictions, impaired UE functional use, postural dysfunction, and pain.   ACTIVITY LIMITATIONS: carrying, lifting, bending, sleeping, bed  mobility, bathing, dressing, reach over head, hygiene/grooming, and locomotion level  PARTICIPATION LIMITATIONS: meal prep, cleaning, laundry, shopping, and community activity  PERSONAL FACTORS: Age, Fitness, Past/current experiences, Time since onset of injury/illness/exacerbation, and 3+ comorbidities: spinal stenosis, rotator cuff issues bilaterally, neuropathy are also affecting patient's functional outcome.   REHAB POTENTIAL: Good  CLINICAL DECISION MAKING: Evolving/moderate complexity  EVALUATION COMPLEXITY: Moderate  GOALS: Goals reviewed with patient? Yes  SHORT TERM GOALS: Target date: 11/27/24  Pt will demonstrate 75 degrees of L shoulder flexion to allow her to get her arm up for radiation. Baseline: Goal status: INITIAL  2.  Pt will demonstrate 75 degrees of L shoulder abduction to allow her to get her arm in position for radiation. Baseline:  Goal status: INITIAL  3.  Pt will be independent in initial HEP for shoulder ROM.  Baseline:  Goal status: INITIAL   LONG TERM GOALS: Target date: 12/11/24  Pt will demonstrate 130 degrees of bilateral shoulder flexion to allow her to reach overhead.  Baseline:  Goal status: INITIAL  2.  Pt will demonstrate 130 degrees of bilateral shoulder abduction to allow her to reach out to the side.  Baseline:  Goal status: INITIAL  3.  Pt will be independent in a home exercise program for continued stretching  and strengthening.  Baseline:  Goal status: INITIAL  4.  Pt will be independent in self MLD for long term management of lymphedema.  Baseline:  Goal status: INITIAL  5.  Pt will obtain an appropriate compression bra for long term management of lymphedema.  Baseline:  Goal status: INITIAL   PLAN:  PT FREQUENCY: 2x/week  PT DURATION: 6 weeks  PLANNED INTERVENTIONS: 02835- PT Re-evaluation, 97750- Physical Performance Testing, 97110-Therapeutic exercises, 97530- Therapeutic activity, 97112- Neuromuscular re-education, 97535- Self Care, 02859- Manual therapy, [phone]- Gait training, [phone]- Orthotic Initial, [phone]- Orthotic/Prosthetic subsequent, Patient/Family education, Balance training, Joint mobilization, Therapeutic exercises, Therapeutic activity, Neuromuscular re-education, Gait training, and Self Care  PLAN FOR NEXT SESSION: do SOZO, measure UE circumferences,  pulleys, ball, PROM to bilateral shoulders, instruct in self MLD is skin allows, pt will need compression bra eventually make goals for LE strength and balance  [employer], PT 10/30/2024, 5:24 PM  "

## 2024-10-30 ENCOUNTER — Ambulatory Visit
Admission: RE | Admit: 2024-10-30 | Discharge: 2024-10-30 | Disposition: A | Discharge status: Home / Self Care | Source: Ambulatory Visit | Attending: Radiation Oncology | Admitting: Radiation Oncology

## 2024-10-30 ENCOUNTER — Ambulatory Visit

## 2024-10-30 ENCOUNTER — Other Ambulatory Visit: Payer: Self-pay

## 2024-10-30 ENCOUNTER — Encounter: Payer: Self-pay | Admitting: Physical Therapy

## 2024-10-30 ENCOUNTER — Ambulatory Visit: Admitting: Physical Therapy

## 2024-10-30 DIAGNOSIS — C773 Secondary and unspecified malignant neoplasm of axilla and upper limb lymph nodes: Secondary | ICD-10-CM | POA: Insufficient documentation

## 2024-10-30 DIAGNOSIS — R293 Abnormal posture: Secondary | ICD-10-CM | POA: Diagnosis present

## 2024-10-30 DIAGNOSIS — M25612 Stiffness of left shoulder, not elsewhere classified: Secondary | ICD-10-CM | POA: Diagnosis present

## 2024-10-30 DIAGNOSIS — Z510 Encounter for antineoplastic radiation therapy: Secondary | ICD-10-CM | POA: Diagnosis not present

## 2024-10-30 DIAGNOSIS — M25611 Stiffness of right shoulder, not elsewhere classified: Secondary | ICD-10-CM | POA: Insufficient documentation

## 2024-10-30 DIAGNOSIS — Z170 Estrogen receptor positive status [ER+]: Secondary | ICD-10-CM | POA: Insufficient documentation

## 2024-10-30 DIAGNOSIS — C50912 Malignant neoplasm of unspecified site of left female breast: Secondary | ICD-10-CM | POA: Diagnosis present

## 2024-10-30 DIAGNOSIS — C50419 Malignant neoplasm of upper-outer quadrant of unspecified female breast: Secondary | ICD-10-CM | POA: Insufficient documentation

## 2024-10-30 DIAGNOSIS — R296 Repeated falls: Secondary | ICD-10-CM | POA: Diagnosis present

## 2024-10-30 DIAGNOSIS — M6281 Muscle weakness (generalized): Secondary | ICD-10-CM | POA: Diagnosis present

## 2024-10-30 DIAGNOSIS — C50412 Malignant neoplasm of upper-outer quadrant of left female breast: Secondary | ICD-10-CM | POA: Diagnosis present

## 2024-10-30 LAB — RAD ONC ARIA SESSION SUMMARY
Course Elapsed Days: 55
Plan Fractions Treated to Date: 2
Plan Fractions Treated to Date: 23
Plan Prescribed Dose Per Fraction: 1.8 Gy
Plan Prescribed Dose Per Fraction: 1.8 Gy
Plan Total Fractions Prescribed: 28
Plan Total Fractions Prescribed: 7
Plan Total Prescribed Dose: 12.6 Gy
Plan Total Prescribed Dose: 50.4 Gy
Reference Point Dosage Given to Date: 41.4 Gy
Reference Point Dosage Given to Date: 41.4 Gy
Reference Point Session Dosage Given: 1.8 Gy
Reference Point Session Dosage Given: 1.8 Gy
Session Number: 23

## 2024-10-31 ENCOUNTER — Other Ambulatory Visit: Payer: Self-pay

## 2024-10-31 ENCOUNTER — Ambulatory Visit

## 2024-10-31 ENCOUNTER — Ambulatory Visit
Admission: RE | Admit: 2024-10-31 | Discharge: 2024-10-31 | Disposition: A | Discharge status: Home / Self Care | Source: Ambulatory Visit | Attending: Radiation Oncology | Admitting: Radiation Oncology

## 2024-10-31 DIAGNOSIS — Z510 Encounter for antineoplastic radiation therapy: Secondary | ICD-10-CM | POA: Diagnosis not present

## 2024-10-31 LAB — RAD ONC ARIA SESSION SUMMARY
Course Elapsed Days: 56
Plan Fractions Treated to Date: 24
Plan Fractions Treated to Date: 3
Plan Prescribed Dose Per Fraction: 1.8 Gy
Plan Prescribed Dose Per Fraction: 1.8 Gy
Plan Total Fractions Prescribed: 28
Plan Total Fractions Prescribed: 7
Plan Total Prescribed Dose: 12.6 Gy
Plan Total Prescribed Dose: 50.4 Gy
Reference Point Dosage Given to Date: 43.2 Gy
Reference Point Dosage Given to Date: 43.2 Gy
Reference Point Session Dosage Given: 1.8 Gy
Reference Point Session Dosage Given: 1.8 Gy
Session Number: 24

## 2024-11-01 ENCOUNTER — Ambulatory Visit

## 2024-11-02 ENCOUNTER — Ambulatory Visit

## 2024-11-02 ENCOUNTER — Ambulatory Visit
Admission: RE | Admit: 2024-11-02 | Discharge: 2024-11-02 | Disposition: A | Discharge status: Home / Self Care | Source: Ambulatory Visit | Attending: Radiation Oncology | Admitting: Radiation Oncology

## 2024-11-02 ENCOUNTER — Other Ambulatory Visit: Payer: Self-pay

## 2024-11-02 DIAGNOSIS — C50412 Malignant neoplasm of upper-outer quadrant of left female breast: Secondary | ICD-10-CM | POA: Insufficient documentation

## 2024-11-02 DIAGNOSIS — C773 Secondary and unspecified malignant neoplasm of axilla and upper limb lymph nodes: Secondary | ICD-10-CM | POA: Insufficient documentation

## 2024-11-02 DIAGNOSIS — Z510 Encounter for antineoplastic radiation therapy: Secondary | ICD-10-CM | POA: Diagnosis present

## 2024-11-02 LAB — RAD ONC ARIA SESSION SUMMARY
Course Elapsed Days: 58
Plan Fractions Treated to Date: 25
Plan Fractions Treated to Date: 4
Plan Prescribed Dose Per Fraction: 1.8 Gy
Plan Prescribed Dose Per Fraction: 1.8 Gy
Plan Total Fractions Prescribed: 28
Plan Total Fractions Prescribed: 7
Plan Total Prescribed Dose: 12.6 Gy
Plan Total Prescribed Dose: 50.4 Gy
Reference Point Dosage Given to Date: 45 Gy
Reference Point Dosage Given to Date: 45 Gy
Reference Point Session Dosage Given: 1.8 Gy
Reference Point Session Dosage Given: 1.8 Gy
Session Number: 25

## 2024-11-05 ENCOUNTER — Ambulatory Visit
Admission: RE | Admit: 2024-11-05 | Discharge: 2024-11-05 | Disposition: A | Discharge status: Home / Self Care | Source: Ambulatory Visit | Attending: Radiation Oncology | Admitting: Radiation Oncology

## 2024-11-05 ENCOUNTER — Other Ambulatory Visit: Payer: Self-pay

## 2024-11-05 ENCOUNTER — Ambulatory Visit

## 2024-11-05 DIAGNOSIS — Z510 Encounter for antineoplastic radiation therapy: Secondary | ICD-10-CM | POA: Diagnosis not present

## 2024-11-05 LAB — RAD ONC ARIA SESSION SUMMARY
Course Elapsed Days: 61
Plan Fractions Treated to Date: 26
Plan Fractions Treated to Date: 5
Plan Prescribed Dose Per Fraction: 1.8 Gy
Plan Prescribed Dose Per Fraction: 1.8 Gy
Plan Total Fractions Prescribed: 28
Plan Total Fractions Prescribed: 7
Plan Total Prescribed Dose: 12.6 Gy
Plan Total Prescribed Dose: 50.4 Gy
Reference Point Dosage Given to Date: 46.8 Gy
Reference Point Dosage Given to Date: 46.8 Gy
Reference Point Session Dosage Given: 1.8 Gy
Reference Point Session Dosage Given: 1.8 Gy
Session Number: 26

## 2024-11-06 ENCOUNTER — Inpatient Hospital Stay: Attending: Physician Assistant | Admitting: Licensed Clinical Social Worker

## 2024-11-06 ENCOUNTER — Other Ambulatory Visit: Payer: Self-pay

## 2024-11-06 ENCOUNTER — Ambulatory Visit
Admission: RE | Admit: 2024-11-06 | Discharge: 2024-11-06 | Disposition: A | Source: Ambulatory Visit | Attending: Radiation Oncology

## 2024-11-06 DIAGNOSIS — Z510 Encounter for antineoplastic radiation therapy: Secondary | ICD-10-CM | POA: Diagnosis not present

## 2024-11-06 LAB — RAD ONC ARIA SESSION SUMMARY
Course Elapsed Days: 62
Plan Fractions Treated to Date: 27
Plan Fractions Treated to Date: 6
Plan Prescribed Dose Per Fraction: 1.8 Gy
Plan Prescribed Dose Per Fraction: 1.8 Gy
Plan Total Fractions Prescribed: 28
Plan Total Fractions Prescribed: 7
Plan Total Prescribed Dose: 12.6 Gy
Plan Total Prescribed Dose: 50.4 Gy
Reference Point Dosage Given to Date: 48.6 Gy
Reference Point Dosage Given to Date: 48.6 Gy
Reference Point Session Dosage Given: 1.8 Gy
Reference Point Session Dosage Given: 1.8 Gy
Session Number: 27

## 2024-11-06 NOTE — Progress Notes (Signed)
 [facility] CSW Progress Note  Clinical Child Psychotherapist contacted patient by phone to follow-up on financial concerns.    Interventions: Completed application for [name] Providing bag from [facility] food pantry for 11/07/24     Follow Up Plan:  Patient will contact CSW with any support or resource needs    [name] E Franz Svec, LCSW Clinical Social Worker [facility]

## 2024-11-07 ENCOUNTER — Other Ambulatory Visit: Payer: Self-pay

## 2024-11-07 ENCOUNTER — Ambulatory Visit

## 2024-11-07 ENCOUNTER — Ambulatory Visit
Admission: RE | Admit: 2024-11-07 | Discharge: 2024-11-07 | Disposition: A | Discharge status: Home / Self Care | Source: Ambulatory Visit | Attending: Radiation Oncology | Admitting: Radiation Oncology

## 2024-11-07 DIAGNOSIS — Z510 Encounter for antineoplastic radiation therapy: Secondary | ICD-10-CM | POA: Diagnosis not present

## 2024-11-07 LAB — RAD ONC ARIA SESSION SUMMARY
Course Elapsed Days: 63
Plan Fractions Treated to Date: 28
Plan Fractions Treated to Date: 7
Plan Prescribed Dose Per Fraction: 1.8 Gy
Plan Prescribed Dose Per Fraction: 1.8 Gy
Plan Total Fractions Prescribed: 28
Plan Total Fractions Prescribed: 7
Plan Total Prescribed Dose: 12.6 Gy
Plan Total Prescribed Dose: 50.4 Gy
Reference Point Dosage Given to Date: 50.4 Gy
Reference Point Dosage Given to Date: 50.4 Gy
Reference Point Session Dosage Given: 1.8 Gy
Reference Point Session Dosage Given: 1.8 Gy
Session Number: 28

## 2024-11-08 ENCOUNTER — Ambulatory Visit
Admission: RE | Admit: 2024-11-08 | Discharge: 2024-11-08 | Disposition: A | Discharge status: Home / Self Care | Source: Ambulatory Visit | Attending: Radiation Oncology | Admitting: Radiation Oncology

## 2024-11-08 ENCOUNTER — Other Ambulatory Visit: Payer: Self-pay

## 2024-11-08 ENCOUNTER — Ambulatory Visit

## 2024-11-08 DIAGNOSIS — Z510 Encounter for antineoplastic radiation therapy: Secondary | ICD-10-CM | POA: Diagnosis not present

## 2024-11-08 LAB — RAD ONC ARIA SESSION SUMMARY
Course Elapsed Days: 64
Plan Fractions Treated to Date: 1
Plan Fractions Treated to Date: 1
Plan Prescribed Dose Per Fraction: 2 Gy
Plan Prescribed Dose Per Fraction: 2 Gy
Plan Total Fractions Prescribed: 5
Plan Total Fractions Prescribed: 5
Plan Total Prescribed Dose: 10 Gy
Plan Total Prescribed Dose: 10 Gy
Reference Point Dosage Given to Date: 2 Gy
Reference Point Dosage Given to Date: 2 Gy
Reference Point Session Dosage Given: 2 Gy
Reference Point Session Dosage Given: 2 Gy
Session Number: 29

## 2024-11-09 ENCOUNTER — Ambulatory Visit

## 2024-11-12 ENCOUNTER — Ambulatory Visit

## 2024-11-12 ENCOUNTER — Ambulatory Visit
Admission: RE | Admit: 2024-11-12 | Discharge: 2024-11-12 | Disposition: A | Source: Ambulatory Visit | Attending: Radiation Oncology

## 2024-11-12 ENCOUNTER — Other Ambulatory Visit: Payer: Self-pay

## 2024-11-12 DIAGNOSIS — Z510 Encounter for antineoplastic radiation therapy: Secondary | ICD-10-CM | POA: Diagnosis not present

## 2024-11-12 LAB — RAD ONC ARIA SESSION SUMMARY
Course Elapsed Days: 68
Plan Fractions Treated to Date: 2
Plan Fractions Treated to Date: 2
Plan Prescribed Dose Per Fraction: 2 Gy
Plan Prescribed Dose Per Fraction: 2 Gy
Plan Total Fractions Prescribed: 5
Plan Total Fractions Prescribed: 5
Plan Total Prescribed Dose: 10 Gy
Plan Total Prescribed Dose: 10 Gy
Reference Point Dosage Given to Date: 4 Gy
Reference Point Dosage Given to Date: 4 Gy
Reference Point Session Dosage Given: 2 Gy
Reference Point Session Dosage Given: 2 Gy
Session Number: 30

## 2024-11-13 ENCOUNTER — Ambulatory Visit
Admission: RE | Admit: 2024-11-13 | Discharge: 2024-11-13 | Disposition: A | Source: Ambulatory Visit | Attending: Radiation Oncology

## 2024-11-13 ENCOUNTER — Other Ambulatory Visit: Payer: Self-pay

## 2024-11-13 DIAGNOSIS — Z510 Encounter for antineoplastic radiation therapy: Secondary | ICD-10-CM | POA: Diagnosis not present

## 2024-11-13 LAB — RAD ONC ARIA SESSION SUMMARY
Course Elapsed Days: 69
Plan Fractions Treated to Date: 3
Plan Fractions Treated to Date: 3
Plan Prescribed Dose Per Fraction: 2 Gy
Plan Prescribed Dose Per Fraction: 2 Gy
Plan Total Fractions Prescribed: 5
Plan Total Fractions Prescribed: 5
Plan Total Prescribed Dose: 10 Gy
Plan Total Prescribed Dose: 10 Gy
Reference Point Dosage Given to Date: 6 Gy
Reference Point Dosage Given to Date: 6 Gy
Reference Point Session Dosage Given: 2 Gy
Reference Point Session Dosage Given: 2 Gy
Session Number: 31

## 2024-11-14 ENCOUNTER — Ambulatory Visit

## 2024-11-15 ENCOUNTER — Ambulatory Visit: Attending: Radiation Oncology

## 2024-11-15 ENCOUNTER — Ambulatory Visit

## 2024-11-15 ENCOUNTER — Other Ambulatory Visit: Payer: Self-pay

## 2024-11-15 ENCOUNTER — Ambulatory Visit
Admission: RE | Admit: 2024-11-15 | Discharge: 2024-11-15 | Disposition: A | Source: Ambulatory Visit | Attending: Radiation Oncology

## 2024-11-15 DIAGNOSIS — M6281 Muscle weakness (generalized): Secondary | ICD-10-CM | POA: Insufficient documentation

## 2024-11-15 DIAGNOSIS — R296 Repeated falls: Secondary | ICD-10-CM | POA: Insufficient documentation

## 2024-11-15 DIAGNOSIS — M25611 Stiffness of right shoulder, not elsewhere classified: Secondary | ICD-10-CM | POA: Insufficient documentation

## 2024-11-15 DIAGNOSIS — C50912 Malignant neoplasm of unspecified site of left female breast: Secondary | ICD-10-CM | POA: Insufficient documentation

## 2024-11-15 DIAGNOSIS — R293 Abnormal posture: Secondary | ICD-10-CM | POA: Insufficient documentation

## 2024-11-15 DIAGNOSIS — Z510 Encounter for antineoplastic radiation therapy: Secondary | ICD-10-CM | POA: Diagnosis not present

## 2024-11-15 DIAGNOSIS — C50412 Malignant neoplasm of upper-outer quadrant of left female breast: Secondary | ICD-10-CM | POA: Insufficient documentation

## 2024-11-15 DIAGNOSIS — C50419 Malignant neoplasm of upper-outer quadrant of unspecified female breast: Secondary | ICD-10-CM | POA: Insufficient documentation

## 2024-11-15 DIAGNOSIS — M25612 Stiffness of left shoulder, not elsewhere classified: Secondary | ICD-10-CM | POA: Insufficient documentation

## 2024-11-15 DIAGNOSIS — C773 Secondary and unspecified malignant neoplasm of axilla and upper limb lymph nodes: Secondary | ICD-10-CM | POA: Insufficient documentation

## 2024-11-15 DIAGNOSIS — Z170 Estrogen receptor positive status [ER+]: Secondary | ICD-10-CM | POA: Insufficient documentation

## 2024-11-15 LAB — RAD ONC ARIA SESSION SUMMARY
Course Elapsed Days: 71
Plan Fractions Treated to Date: 4
Plan Fractions Treated to Date: 4
Plan Prescribed Dose Per Fraction: 2 Gy
Plan Prescribed Dose Per Fraction: 2 Gy
Plan Total Fractions Prescribed: 5
Plan Total Fractions Prescribed: 5
Plan Total Prescribed Dose: 10 Gy
Plan Total Prescribed Dose: 10 Gy
Reference Point Dosage Given to Date: 8 Gy
Reference Point Dosage Given to Date: 8 Gy
Reference Point Session Dosage Given: 2 Gy
Reference Point Session Dosage Given: 2 Gy
Session Number: 32

## 2024-11-16 ENCOUNTER — Ambulatory Visit
Admission: RE | Admit: 2024-11-16 | Discharge: 2024-11-16 | Disposition: A | Discharge status: Home / Self Care | Source: Ambulatory Visit | Attending: Radiation Oncology | Admitting: Radiation Oncology

## 2024-11-16 ENCOUNTER — Other Ambulatory Visit: Payer: Self-pay

## 2024-11-16 DIAGNOSIS — Z510 Encounter for antineoplastic radiation therapy: Secondary | ICD-10-CM | POA: Diagnosis not present

## 2024-11-16 LAB — RAD ONC ARIA SESSION SUMMARY
Course Elapsed Days: 72
Plan Fractions Treated to Date: 5
Plan Fractions Treated to Date: 5
Plan Prescribed Dose Per Fraction: 2 Gy
Plan Prescribed Dose Per Fraction: 2 Gy
Plan Total Fractions Prescribed: 5
Plan Total Fractions Prescribed: 5
Plan Total Prescribed Dose: 10 Gy
Plan Total Prescribed Dose: 10 Gy
Reference Point Dosage Given to Date: 10 Gy
Reference Point Dosage Given to Date: 10 Gy
Reference Point Session Dosage Given: 2 Gy
Reference Point Session Dosage Given: 2 Gy
Session Number: 33

## 2024-11-19 ENCOUNTER — Encounter: Payer: Self-pay | Admitting: Licensed Clinical Social Worker

## 2024-11-19 NOTE — Progress Notes (Signed)
 [facility] CSW Progress Note   Patient was approved for & received grant from [employer]    Tyasia Packard E Vail Basista, LCSW Clinical Social Worker [employer]

## 2024-11-19 NOTE — Radiation Completion Notes (Signed)
 Patient Name: [name], SCHNOEBELEN MRN: [id number] Date of Birth: [birth year] Referring Physician: MACKEY CHAD, M.D. Date of Service: 2024-11-19 Radiation Oncologist: Norleen Limes, M.D. [facility]                             RADIATION ONCOLOGY END OF TREATMENT NOTE     Diagnosis: C50.419 Malignant neoplasm of upper-outer quadrant of unspecified female breast; C50.412 Malignant neoplasm of upper-outer quadrant of left female breast Staging on 2024-04-10: Breast cancer metastasized to axillary lymph node, left (HCC) T=cT2, N=cN1, M=cM0 Intent: Curative     ==========DELIVERED PLANS==========  First Treatment Date: 2024-09-05 Last Treatment Date: 2024-11-16   Plan Name: Breast_L Site: Breast, Left Technique: 3D Mode: Photon Dose Per Fraction: 1.8 Gy Prescribed Dose (Delivered / Prescribed): 50.4 Gy / 50.4 Gy Prescribed Fxs (Delivered / Prescribed): 28 / 28   Plan Name: Breast_L_SCV Site: Sclav-LT Technique: 3D Mode: Photon Dose Per Fraction: 1.8 Gy Prescribed Dose (Delivered / Prescribed): 37.8 Gy / 37.8 Gy Prescribed Fxs (Delivered / Prescribed): 21 / 21   Plan Name: Breast_L_SCV:1 Site: Sclav-LT Technique: 3D Mode: Photon Dose Per Fraction: 1.8 Gy Prescribed Dose (Delivered / Prescribed): 12.6 Gy / 12.6 Gy Prescribed Fxs (Delivered / Prescribed): 7 / 7   Plan Name: BrstLBstUpper Site: Breast, Left Technique: 3D Mode: Photon Dose Per Fraction: 2 Gy Prescribed Dose (Delivered / Prescribed): 10 Gy / 10 Gy Prescribed Fxs (Delivered / Prescribed): 5 / 5   Plan Name: BrstLBstLower Site: Breast, Left Technique: 3D Mode: Photon Dose Per Fraction: 2 Gy Prescribed Dose (Delivered / Prescribed): 10 Gy / 10 Gy Prescribed Fxs (Delivered / Prescribed): 5 / 5     ==========ON TREATMENT VISIT DATES========== 2024-09-12, 2024-09-14, 2024-10-05, 2024-10-12, 2024-10-19, 2024-11-12, 2024-11-16     ==========UPCOMING VISITS========== 01/18/2025 [facility]  ONCOLOGY NURSE EVAL [facility] NURSE VISIT  01/18/2025 [facility] ONCOLOGY SURVIVORSHIP CARE PLAN VISIT [place] Morna Pickle, NP  12/17/2024 [facility] ONC POST TREATMENT CALL [facility] TREATMENT  12/11/2024 [facility] AT [address] [place] Florina CROME, [place]  12/06/2024 [facility] RE[facility] AT [facility] [place] Grayce PARAS, [place]  12/04/2024 [facility] REH AT [facility] [address] [place], [place], [place]  11/29/2024 [facility] REH AT [facility] [address], [place], [place]  11/27/2024 [facility] AT [facility] [place], [place] L, PT        ==========APPENDIX - ON TREATMENT VISIT NOTES==========   See weekly On Treatment Notes in Epic for details in the Media tab (listed as Progress notes on the On Treatment Visit Dates listed above).

## 2024-11-20 ENCOUNTER — Ambulatory Visit

## 2024-11-22 ENCOUNTER — Ambulatory Visit: Admitting: Physical Therapy

## 2024-11-27 ENCOUNTER — Ambulatory Visit: Admitting: Physical Therapy

## 2024-11-29 ENCOUNTER — Telehealth: Payer: Self-pay | Admitting: Physical Therapy

## 2024-11-29 ENCOUNTER — Ambulatory Visit: Admitting: Physical Therapy

## 2024-11-29 NOTE — Telephone Encounter (Signed)
 Called pt because she was a no show for her 4 pm PT appointment. Pt did not answer.  [name] [place], [place] 11/29/24 4:22 PM

## 2024-12-03 ENCOUNTER — Encounter: Payer: Self-pay | Admitting: Physical Therapy

## 2024-12-04 ENCOUNTER — Encounter: Payer: Self-pay | Admitting: Physical Therapy

## 2024-12-04 ENCOUNTER — Ambulatory Visit: Admitting: Physical Therapy

## 2024-12-06 ENCOUNTER — Ambulatory Visit

## 2024-12-07 NOTE — Progress Notes (Incomplete)
" °  Radiation Oncology         [phone] ________________________________  Name: [name] MRN: [id number]  Date of Service: 12/17/2024  DOB: May 31, 1976  Post Treatment Telephone Note  Diagnosis:   Malignant neoplasm of upper-outer quadrant of unspecified female breast; Malignant neoplasm of upper-outer quadrant of left female breast; Breast cancer metastasized to axillary lymph node, left   First Treatment Date: 2024-09-05 Last Treatment Date: 2024-11-16   Plan Name: Breast_L Site: Breast, Left Technique: 3D Mode: Photon Dose Per Fraction: 1.8 Gy Prescribed Dose (Delivered / Prescribed): 50.4 Gy / 50.4 Gy Prescribed Fxs (Delivered / Prescribed): 28 / 28   Plan Name: Breast_L_SCV Site: Sclav-LT Technique: 3D Mode: Photon Dose Per Fraction: 1.8 Gy Prescribed Dose (Delivered / Prescribed): 37.8 Gy / 37.8 Gy Prescribed Fxs (Delivered / Prescribed): 21 / 21   Plan Name: Breast_L_SCV:1 Site: Sclav-LT Technique: 3D Mode: Photon Dose Per Fraction: 1.8 Gy Prescribed Dose (Delivered / Prescribed): 12.6 Gy / 12.6 Gy Prescribed Fxs (Delivered / Prescribed): 7 / 7   Plan Name: BrstLBstUpper Site: Breast, Left Technique: 3D Mode: Photon Dose Per Fraction: 2 Gy Prescribed Dose (Delivered / Prescribed): 10 Gy / 10 Gy Prescribed Fxs (Delivered / Prescribed): 5 / 5   Plan Name: BrstLBstLower Site: Breast, Left Technique: 3D Mode: Photon Dose Per Fraction: 2 Gy Prescribed Dose (Delivered / Prescribed): 10 Gy / 10 Gy Prescribed Fxs (Delivered / Prescribed): 5 / 5   The patient {WAS/WAS NOT:[phone]::was not} available for call today.   Symptoms of fatigue {ACTIONS; HAVE/HAVE NOT:19434} improved since completing therapy.  Symptoms of skin changes {ACTIONS; HAVE/HAVE NOT:19434} improved since completing therapy.  The patient was encouraged to avoid sun exposure in the area of prior treatment for up to one year following radiation with either sunscreen or by the style of  clothing worn in the sun.  The patient has scheduled follow up with her medical oncologist Dr. Gudena for ongoing surveillance, and was encouraged to call if she develops concerns or questions regarding radiation.   "

## 2024-12-11 ENCOUNTER — Ambulatory Visit: Admitting: Physical Therapy

## 2024-12-17 ENCOUNTER — Ambulatory Visit

## 2025-01-18 ENCOUNTER — Inpatient Hospital Stay

## 2025-01-18 ENCOUNTER — Inpatient Hospital Stay: Admitting: Adult Health
